# Patient Record
Sex: Male | Born: 1945 | Hispanic: No | Marital: Married | Smoking: Never smoker
Health system: Southern US, Community
[De-identification: ages and names within clinical notes are randomized; demographics above are authoritative.]

## PROBLEM LIST (undated history)

## (undated) DIAGNOSIS — Z95811 Presence of heart assist device: Secondary | ICD-10-CM

## (undated) DIAGNOSIS — Z87442 Personal history of urinary calculi: Secondary | ICD-10-CM

## (undated) DIAGNOSIS — I5042 Chronic combined systolic (congestive) and diastolic (congestive) heart failure: Secondary | ICD-10-CM

## (undated) DIAGNOSIS — G4730 Sleep apnea, unspecified: Secondary | ICD-10-CM

## (undated) DIAGNOSIS — Z95810 Presence of automatic (implantable) cardiac defibrillator: Secondary | ICD-10-CM

## (undated) DIAGNOSIS — Z8719 Personal history of other diseases of the digestive system: Secondary | ICD-10-CM

## (undated) DIAGNOSIS — E119 Type 2 diabetes mellitus without complications: Secondary | ICD-10-CM

## (undated) DIAGNOSIS — R570 Cardiogenic shock: Secondary | ICD-10-CM

## (undated) DIAGNOSIS — E785 Hyperlipidemia, unspecified: Secondary | ICD-10-CM

## (undated) DIAGNOSIS — Z4502 Encounter for adjustment and management of automatic implantable cardiac defibrillator: Secondary | ICD-10-CM

## (undated) DIAGNOSIS — N189 Chronic kidney disease, unspecified: Secondary | ICD-10-CM

## (undated) DIAGNOSIS — Z87898 Personal history of other specified conditions: Secondary | ICD-10-CM

## (undated) DIAGNOSIS — N135 Crossing vessel and stricture of ureter without hydronephrosis: Secondary | ICD-10-CM

## (undated) DIAGNOSIS — N179 Acute kidney failure, unspecified: Secondary | ICD-10-CM

## (undated) DIAGNOSIS — E559 Vitamin D deficiency, unspecified: Secondary | ICD-10-CM

## (undated) DIAGNOSIS — J939 Pneumothorax, unspecified: Secondary | ICD-10-CM

## (undated) HISTORY — DX: Pneumothorax, unspecified: J93.9

## (undated) HISTORY — DX: Presence of heart assist device: Z95.811

---
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
## 2002-04-05 ENCOUNTER — Encounter: Payer: Self-pay | Admitting: Emergency Medicine

## 2002-04-05 ENCOUNTER — Emergency Department (HOSPITAL_COMMUNITY): Admission: EM | Admit: 2002-04-05 | Discharge: 2002-04-05 | Payer: Self-pay | Admitting: Emergency Medicine

## 2002-04-12 ENCOUNTER — Emergency Department (HOSPITAL_COMMUNITY): Admission: EM | Admit: 2002-04-12 | Discharge: 2002-04-12 | Payer: Self-pay | Admitting: Emergency Medicine

## 2003-07-24 ENCOUNTER — Encounter
Admission: RE | Admit: 2003-07-24 | Discharge: 2003-10-22 | Payer: Self-pay | Admitting: Physical Medicine & Rehabilitation

## 2005-03-20 ENCOUNTER — Ambulatory Visit (HOSPITAL_COMMUNITY): Admission: RE | Admit: 2005-03-20 | Discharge: 2005-03-20 | Payer: Self-pay | Admitting: Cardiovascular Disease

## 2005-08-25 ENCOUNTER — Encounter: Admission: RE | Admit: 2005-08-25 | Discharge: 2005-08-25 | Payer: Self-pay | Admitting: Specialist

## 2011-01-31 ENCOUNTER — Other Ambulatory Visit (HOSPITAL_COMMUNITY): Payer: Self-pay | Admitting: Cardiovascular Disease

## 2011-02-06 ENCOUNTER — Inpatient Hospital Stay (HOSPITAL_COMMUNITY): Admission: RE | Admit: 2011-02-06 | Payer: Self-pay | Source: Ambulatory Visit

## 2011-02-06 ENCOUNTER — Other Ambulatory Visit (HOSPITAL_COMMUNITY): Payer: Self-pay

## 2014-08-08 ENCOUNTER — Other Ambulatory Visit: Payer: Self-pay | Admitting: Orthopaedic Surgery

## 2014-08-08 DIAGNOSIS — M47816 Spondylosis without myelopathy or radiculopathy, lumbar region: Secondary | ICD-10-CM

## 2014-08-13 ENCOUNTER — Ambulatory Visit
Admission: RE | Admit: 2014-08-13 | Discharge: 2014-08-13 | Disposition: A | Discharge status: Home / Self Care | Payer: BLUE CROSS/BLUE SHIELD | Source: Ambulatory Visit | Attending: Orthopaedic Surgery | Admitting: Orthopaedic Surgery

## 2014-08-13 DIAGNOSIS — M47816 Spondylosis without myelopathy or radiculopathy, lumbar region: Secondary | ICD-10-CM

## 2015-03-14 ENCOUNTER — Other Ambulatory Visit: Payer: Self-pay | Admitting: Urology

## 2015-03-23 ENCOUNTER — Encounter (HOSPITAL_COMMUNITY)
Admission: RE | Admit: 2015-03-23 | Discharge: 2015-03-23 | Disposition: A | Discharge status: Home / Self Care | Payer: Medicare Other | Source: Ambulatory Visit | Attending: Urology | Admitting: Urology

## 2015-03-23 ENCOUNTER — Encounter (HOSPITAL_COMMUNITY): Payer: Self-pay

## 2015-03-23 DIAGNOSIS — Z01818 Encounter for other preprocedural examination: Secondary | ICD-10-CM | POA: Insufficient documentation

## 2015-03-23 HISTORY — DX: Personal history of other specified conditions: Z87.898

## 2015-03-23 HISTORY — DX: Chronic kidney disease, unspecified: N18.9

## 2015-03-23 HISTORY — DX: Sleep apnea, unspecified: G47.30

## 2015-03-23 HISTORY — DX: Type 2 diabetes mellitus without complications: E11.9

## 2015-03-23 HISTORY — DX: Vitamin D deficiency, unspecified: E55.9

## 2015-03-23 HISTORY — DX: Personal history of other diseases of the digestive system: Z87.19

## 2015-03-23 HISTORY — DX: Hyperlipidemia, unspecified: E78.5

## 2015-03-23 LAB — COMPREHENSIVE METABOLIC PANEL
ALT: 27 U/L (ref 17–63)
AST: 28 U/L (ref 15–41)
Albumin: 4.1 g/dL (ref 3.5–5.0)
Alkaline Phosphatase: 52 U/L (ref 38–126)
Anion gap: 9 (ref 5–15)
BUN: 14 mg/dL (ref 6–20)
CO2: 24 mmol/L (ref 22–32)
Calcium: 9.1 mg/dL (ref 8.9–10.3)
Chloride: 106 mmol/L (ref 101–111)
Creatinine, Ser: 1.01 mg/dL (ref 0.61–1.24)
GFR calc Af Amer: >60 mL/min (ref >60)
GFR calc non Af Amer: >60 mL/min (ref >60)
Glucose, Bld: 114 mg/dL — ABNORMAL HIGH (ref 65–99)
Potassium: 4.4 mmol/L (ref 3.5–5.1)
Sodium: 139 mmol/L (ref 135–145)
Total Bilirubin: 0.3 mg/dL (ref 0.3–1.2)
Total Protein: 7.1 g/dL (ref 6.5–8.1)

## 2015-03-23 LAB — CBC
HCT: 42.4 % (ref 39.0–52.0)
Hemoglobin: 14.2 g/dL (ref 13.0–17.0)
MCH: 29.9 pg (ref 26.0–34.0)
MCHC: 33.5 g/dL (ref 30.0–36.0)
MCV: 89.3 fL (ref 78.0–100.0)
Platelets: 273 10*3/uL (ref 150–400)
RBC: 4.75 MIL/uL (ref 4.22–5.81)
RDW: 14 % (ref 11.5–15.5)
WBC: 5.7 10*3/uL (ref 4.0–10.5)

## 2015-03-23 NOTE — Patient Instructions (Signed)
Luis [name]  03/23/2015   Your procedure is scheduled on: Monday March 26, 2015   Report to [facility] Main  Entrance take [place]  elevators to 3rd floor to  [name] at 9:30 AM.  Call this number if you have problems the morning of surgery [phone]   Remember: ONLY 1 PERSON MAY GO WITH YOU TO SHORT STAY TO GET  READY MORNING OF [place].  Do not eat food or drink liquids :After Midnight.     Take these medicines the morning of surgery with A SIP OF WATER: NONE                               You may not have any metal on your body including hair pins and              piercings  Do not wear jewelry,  lotions, powders or colognes, deodorant             Men may shave face and neck.   Do not bring valuables to the hospital. [place].  Contacts, dentures or bridgework may not be worn into surgery.  Leave suitcase in the car. After surgery it may be brought to your room.             _____________________________________________________________________             [facility] - Preparing for Surgery Before surgery, you can play an important role.  Because skin is not sterile, your skin needs to be as free of germs as possible.  You can reduce the number of germs on your skin by washing with CHG (chlorahexidine gluconate) soap before surgery.  CHG is an antiseptic cleaner which kills germs and bonds with the skin to continue killing germs even after washing. Please DO NOT use if you have an allergy to CHG or antibacterial soaps.  If your skin becomes reddened/irritated stop using the CHG and inform your nurse when you arrive at Short Stay. Do not shave (including legs and underarms) for at least 48 hours prior to the first CHG shower.  You may shave your face/neck. Please follow these instructions carefully:  1.  Shower with CHG Soap the night before surgery and the  morning of Surgery.  2.  If you  choose to wash your hair, wash your hair first as usual with your  normal  shampoo.  3.  After you shampoo, rinse your hair and body thoroughly to remove the  shampoo.                           4.  Use CHG as you would any other liquid soap.  You can apply chg directly  to the skin and wash                       Gently with a scrungie or clean washcloth.  5.  Apply the CHG Soap to your body ONLY FROM THE NECK DOWN.   Do not use on face/ open                           Wound  or open sores. Avoid contact with eyes, ears mouth and genitals (private parts).                       Wash face,  Genitals (private parts) with your normal soap.             6.  Wash thoroughly, paying special attention to the area where your surgery  will be performed.  7.  Thoroughly rinse your body with warm water from the neck down.  8.  DO NOT shower/wash with your normal soap after using and rinsing off  the CHG Soap.                9.  Pat yourself dry with a clean towel.            10.  Wear clean pajamas.            11.  Place clean sheets on your bed the night of your first shower and do not  sleep with pets. Day of Surgery : Do not apply any lotions/deodorants the morning of surgery.  Please wear clean clothes to the hospital/surgery center.  FAILURE TO FOLLOW THESE INSTRUCTIONS MAY RESULT IN THE CANCELLATION OF YOUR SURGERY PATIENT SIGNATURE_________________________________  NURSE SIGNATURE__________________________________  ________________________________________________________________________

## 2015-03-23 NOTE — Progress Notes (Signed)
Reviewed H&P and EKG's per anesthesia / Dr Marcell Barlow. Anesthesia to see pt day of surgery.

## 2015-03-23 NOTE — Progress Notes (Addendum)
H&P per Dr [name] 02/06/2015 per chart discusses ECHO pt had while in [place] 11/03/2014

## 2015-03-25 MED ORDER — GENTAMICIN SULFATE 40 MG/ML IJ SOLN
420.0000 mg | INTRAVENOUS | Status: AC
  Administered 2015-03-26: 420 mg via INTRAVENOUS
  Filled 2015-03-25: qty 10.5

## 2015-03-26 ENCOUNTER — Inpatient Hospital Stay (HOSPITAL_COMMUNITY): Payer: Medicare Other

## 2015-03-26 ENCOUNTER — Inpatient Hospital Stay (HOSPITAL_COMMUNITY)
Admission: RE | Admit: 2015-03-26 | Discharge: 2015-03-28 | DRG: 661 | Disposition: A | Discharge status: Home / Self Care | Payer: Medicare Other | Source: Ambulatory Visit | Attending: Urology | Admitting: Urology

## 2015-03-26 ENCOUNTER — Inpatient Hospital Stay (HOSPITAL_COMMUNITY): Payer: Medicare Other | Admitting: Registered Nurse

## 2015-03-26 ENCOUNTER — Encounter (HOSPITAL_COMMUNITY): Payer: Self-pay | Admitting: *Deleted

## 2015-03-26 ENCOUNTER — Encounter (HOSPITAL_COMMUNITY)
Admission: RE | Disposition: A | Discharge status: Home / Self Care | Payer: Self-pay | Source: Ambulatory Visit | Attending: Urology

## 2015-03-26 DIAGNOSIS — N182 Chronic kidney disease, stage 2 (mild): Secondary | ICD-10-CM | POA: Diagnosis present

## 2015-03-26 DIAGNOSIS — Z87442 Personal history of urinary calculi: Secondary | ICD-10-CM

## 2015-03-26 DIAGNOSIS — Z01810 Encounter for preprocedural cardiovascular examination: Secondary | ICD-10-CM

## 2015-03-26 DIAGNOSIS — Z23 Encounter for immunization: Secondary | ICD-10-CM | POA: Diagnosis not present

## 2015-03-26 DIAGNOSIS — E119 Type 2 diabetes mellitus without complications: Secondary | ICD-10-CM | POA: Diagnosis present

## 2015-03-26 DIAGNOSIS — N200 Calculus of kidney: Principal | ICD-10-CM | POA: Diagnosis present

## 2015-03-26 DIAGNOSIS — K219 Gastro-esophageal reflux disease without esophagitis: Secondary | ICD-10-CM | POA: Diagnosis present

## 2015-03-26 DIAGNOSIS — K760 Fatty (change of) liver, not elsewhere classified: Secondary | ICD-10-CM | POA: Diagnosis present

## 2015-03-26 DIAGNOSIS — E785 Hyperlipidemia, unspecified: Secondary | ICD-10-CM | POA: Diagnosis present

## 2015-03-26 DIAGNOSIS — N281 Cyst of kidney, acquired: Secondary | ICD-10-CM | POA: Diagnosis present

## 2015-03-26 DIAGNOSIS — E559 Vitamin D deficiency, unspecified: Secondary | ICD-10-CM | POA: Diagnosis present

## 2015-03-26 DIAGNOSIS — N135 Crossing vessel and stricture of ureter without hydronephrosis: Secondary | ICD-10-CM

## 2015-03-26 DIAGNOSIS — G4730 Sleep apnea, unspecified: Secondary | ICD-10-CM | POA: Diagnosis present

## 2015-03-26 DIAGNOSIS — Z01812 Encounter for preprocedural laboratory examination: Secondary | ICD-10-CM

## 2015-03-26 DIAGNOSIS — Q638 Other specified congenital malformations of kidney: Secondary | ICD-10-CM | POA: Diagnosis not present

## 2015-03-26 DIAGNOSIS — Z419 Encounter for procedure for purposes other than remedying health state, unspecified: Secondary | ICD-10-CM

## 2015-03-26 HISTORY — PX: CYSTOSCOPY W/ RETROGRADES: SHX1426

## 2015-03-26 HISTORY — PX: ROBOT ASSISTED PYELOPLASTY: SHX5143

## 2015-03-26 HISTORY — DX: Crossing vessel and stricture of ureter without hydronephrosis: N13.5

## 2015-03-26 LAB — BASIC METABOLIC PANEL
Anion gap: 10 (ref 5–15)
BUN: 19 mg/dL (ref 6–20)
CO2: 22 mmol/L (ref 22–32)
Calcium: 8.1 mg/dL — ABNORMAL LOW (ref 8.9–10.3)
Chloride: 104 mmol/L (ref 101–111)
Creatinine, Ser: 1.18 mg/dL (ref 0.61–1.24)
GFR calc Af Amer: >60 mL/min (ref >60)
GFR calc non Af Amer: >60 mL/min (ref >60)
Glucose, Bld: 123 mg/dL — ABNORMAL HIGH (ref 65–99)
Potassium: 4.3 mmol/L (ref 3.5–5.1)
Sodium: 136 mmol/L (ref 135–145)

## 2015-03-26 LAB — GLUCOSE, CAPILLARY
Glucose-Capillary: 92 mg/dL (ref 65–99)
Glucose-Capillary: 120 mg/dL — ABNORMAL HIGH (ref 65–99)

## 2015-03-26 LAB — HEMOGLOBIN AND HEMATOCRIT, BLOOD
HCT: 42.2 % (ref 39.0–52.0)
Hemoglobin: 14 g/dL (ref 13.0–17.0)

## 2015-03-26 LAB — TYPE AND SCREEN
ABO/RH(D): A POS
Antibody Screen: NEGATIVE

## 2015-03-26 LAB — ABO/RH: ABO/RH(D): A POS

## 2015-03-26 SURGERY — ROBOTIC ASSISTED PYELOPLASTY WITH STENT PLACEMENT
Anesthesia: General | Laterality: Left

## 2015-03-26 MED ORDER — ROSUVASTATIN CALCIUM 20 MG PO TABS
20.0000 mg | ORAL_TABLET | Freq: Every morning | ORAL | Status: DC
  Administered 2015-03-26 – 2015-03-27 (×2): 20 mg via ORAL
  Filled 2015-03-26 (×2): qty 1

## 2015-03-26 MED ORDER — PNEUMOCOCCAL VAC POLYVALENT 25 MCG/0.5ML IJ INJ
0.5000 mL | INJECTION | INTRAMUSCULAR | Status: AC
  Administered 2015-03-27: 0.5 mL via INTRAMUSCULAR
  Filled 2015-03-26 (×2): qty 0.5

## 2015-03-26 MED ORDER — SODIUM CHLORIDE 0.9 % IR SOLN
Status: DC | PRN
  Administered 2015-03-26: 3000 mL

## 2015-03-26 MED ORDER — CEFAZOLIN SODIUM-DEXTROSE 2-3 GM-% IV SOLR
2.0000 g | INTRAVENOUS | Status: AC
  Administered 2015-03-26: 2 g via INTRAVENOUS

## 2015-03-26 MED ORDER — BUPIVACAINE LIPOSOME 1.3 % IJ SUSP
20.0000 mL | Freq: Once | INTRAMUSCULAR | Status: AC
  Administered 2015-03-26: 20 mL
  Filled 2015-03-26: qty 20

## 2015-03-26 MED ORDER — SODIUM CHLORIDE 0.9 % IV SOLN
1.5000 g | Freq: Four times a day (QID) | INTRAVENOUS | Status: AC
  Administered 2015-03-26 – 2015-03-27 (×3): 1.5 g via INTRAVENOUS
  Filled 2015-03-26 (×3): qty 1.5

## 2015-03-26 MED ORDER — LIDOCAINE HCL (CARDIAC) 20 MG/ML IV SOLN
INTRAVENOUS | Status: AC
  Filled 2015-03-26: qty 5

## 2015-03-26 MED ORDER — PROPOFOL 10 MG/ML IV BOLUS
INTRAVENOUS | Status: AC
  Filled 2015-03-26: qty 20

## 2015-03-26 MED ORDER — ACETAMINOPHEN 500 MG PO TABS
1000.0000 mg | ORAL_TABLET | Freq: Three times a day (TID) | ORAL | Status: AC
  Administered 2015-03-26 – 2015-03-27 (×3): 1000 mg via ORAL
  Filled 2015-03-26 (×3): qty 2

## 2015-03-26 MED ORDER — SENNOSIDES-DOCUSATE SODIUM 8.6-50 MG PO TABS
2.0000 | ORAL_TABLET | Freq: Every day | ORAL | Status: DC
  Administered 2015-03-26 – 2015-03-27 (×2): 2 via ORAL
  Filled 2015-03-26 (×2): qty 2

## 2015-03-26 MED ORDER — LACTATED RINGERS IR SOLN
Status: DC | PRN
  Administered 2015-03-26: 1000 mL

## 2015-03-26 MED ORDER — OXYCODONE HCL 5 MG PO TABS
5.0000 mg | ORAL_TABLET | ORAL | Status: DC | PRN
  Administered 2015-03-26 – 2015-03-27 (×3): 5 mg via ORAL
  Filled 2015-03-26 (×3): qty 1

## 2015-03-26 MED ORDER — ONDANSETRON HCL 4 MG/2ML IJ SOLN
INTRAMUSCULAR | Status: AC
  Filled 2015-03-26: qty 2

## 2015-03-26 MED ORDER — LACTATED RINGERS IV SOLN
INTRAVENOUS | Status: DC | PRN
  Administered 2015-03-26: 11:00:00 via INTRAVENOUS

## 2015-03-26 MED ORDER — HYDROMORPHONE HCL 1 MG/ML IJ SOLN
INTRAMUSCULAR | Status: DC | PRN
  Administered 2015-03-26 (×3): .2 mg via INTRAVENOUS
  Administered 2015-03-26: .4 mg via INTRAVENOUS

## 2015-03-26 MED ORDER — SODIUM CHLORIDE 0.9 % IV SOLN
INTRAVENOUS | Status: DC
  Administered 2015-03-26 – 2015-03-27 (×3): via INTRAVENOUS

## 2015-03-26 MED ORDER — WATER FOR IRRIGATION, STERILE IR SOLN
Status: DC | PRN
  Administered 2015-03-26: 3000 mL

## 2015-03-26 MED ORDER — ROCURONIUM BROMIDE 100 MG/10ML IV SOLN
INTRAVENOUS | Status: DC | PRN
  Administered 2015-03-26 (×2): 5 mg via INTRAVENOUS
  Administered 2015-03-26: 40 mg via INTRAVENOUS
  Administered 2015-03-26: 10 mg via INTRAVENOUS

## 2015-03-26 MED ORDER — SODIUM CHLORIDE 0.9 % IJ SOLN
INTRAMUSCULAR | Status: AC
  Filled 2015-03-26: qty 20

## 2015-03-26 MED ORDER — GLYCOPYRROLATE 0.2 MG/ML IJ SOLN
INTRAMUSCULAR | Status: DC | PRN
  Administered 2015-03-26: .6 mg via INTRAVENOUS

## 2015-03-26 MED ORDER — MIDAZOLAM HCL 2 MG/2ML IJ SOLN
INTRAMUSCULAR | Status: AC
Start: 2015-03-26 — End: 2015-03-26
  Filled 2015-03-26: qty 2

## 2015-03-26 MED ORDER — EPHEDRINE SULFATE 50 MG/ML IJ SOLN
INTRAMUSCULAR | Status: AC
  Filled 2015-03-26: qty 1

## 2015-03-26 MED ORDER — SUCCINYLCHOLINE CHLORIDE 20 MG/ML IJ SOLN
INTRAMUSCULAR | Status: DC | PRN
  Administered 2015-03-26: 100 mg via INTRAVENOUS

## 2015-03-26 MED ORDER — LIDOCAINE HCL (CARDIAC) 20 MG/ML IV SOLN
INTRAVENOUS | Status: DC | PRN
  Administered 2015-03-26: 100 mg via INTRAVENOUS

## 2015-03-26 MED ORDER — MIDAZOLAM HCL 5 MG/5ML IJ SOLN
INTRAMUSCULAR | Status: DC | PRN
  Administered 2015-03-26 (×2): 1 mg via INTRAVENOUS

## 2015-03-26 MED ORDER — HYDROMORPHONE HCL 1 MG/ML IJ SOLN
0.5000 mg | INTRAMUSCULAR | Status: DC | PRN
  Administered 2015-03-26 – 2015-03-28 (×2): 1 mg via INTRAVENOUS
  Filled 2015-03-26 (×4): qty 1

## 2015-03-26 MED ORDER — HYDROMORPHONE HCL 1 MG/ML IJ SOLN
INTRAMUSCULAR | Status: AC
  Filled 2015-03-26: qty 1

## 2015-03-26 MED ORDER — NEOSTIGMINE METHYLSULFATE 10 MG/10ML IV SOLN
INTRAVENOUS | Status: DC | PRN
  Administered 2015-03-26: 4 mg via INTRAVENOUS

## 2015-03-26 MED ORDER — ONDANSETRON HCL 4 MG/2ML IJ SOLN: 4.0000 mg | INTRAMUSCULAR | Status: DC | PRN

## 2015-03-26 MED ORDER — IOHEXOL 300 MG/ML  SOLN
INTRAMUSCULAR | Status: DC | PRN
  Administered 2015-03-26: 9 mL

## 2015-03-26 MED ORDER — CEFAZOLIN SODIUM-DEXTROSE 2-3 GM-% IV SOLR
INTRAVENOUS | Status: AC
  Filled 2015-03-26: qty 50

## 2015-03-26 MED ORDER — HYDROMORPHONE HCL 2 MG/ML IJ SOLN
INTRAMUSCULAR | Status: AC
  Filled 2015-03-26: qty 1

## 2015-03-26 MED ORDER — PHENYLEPHRINE HCL 10 MG/ML IJ SOLN
INTRAMUSCULAR | Status: DC | PRN
  Administered 2015-03-26 (×2): 80 ug via INTRAVENOUS

## 2015-03-26 MED ORDER — SODIUM CHLORIDE 0.9 % IJ SOLN
INTRAMUSCULAR | Status: AC
  Filled 2015-03-26: qty 10

## 2015-03-26 MED ORDER — ONDANSETRON HCL 4 MG/2ML IJ SOLN
INTRAMUSCULAR | Status: DC | PRN
  Administered 2015-03-26: 4 mg via INTRAVENOUS

## 2015-03-26 MED ORDER — FENTANYL CITRATE (PF) 100 MCG/2ML IJ SOLN
INTRAMUSCULAR | Status: DC | PRN
  Administered 2015-03-26 (×5): 50 ug via INTRAVENOUS

## 2015-03-26 MED ORDER — ROCURONIUM BROMIDE 100 MG/10ML IV SOLN
INTRAVENOUS | Status: AC
  Filled 2015-03-26: qty 1

## 2015-03-26 MED ORDER — GLYCOPYRROLATE 0.2 MG/ML IJ SOLN
INTRAMUSCULAR | Status: AC
  Filled 2015-03-26: qty 3

## 2015-03-26 MED ORDER — PROPOFOL 10 MG/ML IV BOLUS
INTRAVENOUS | Status: DC | PRN
  Administered 2015-03-26: 180 mg via INTRAVENOUS

## 2015-03-26 MED ORDER — FENTANYL CITRATE (PF) 250 MCG/5ML IJ SOLN
INTRAMUSCULAR | Status: AC
  Filled 2015-03-26: qty 5

## 2015-03-26 MED ORDER — HYDROMORPHONE HCL 1 MG/ML IJ SOLN
0.2500 mg | INTRAMUSCULAR | Status: DC | PRN
  Administered 2015-03-26: 0.25 mg via INTRAVENOUS
  Administered 2015-03-26: 0.5 mg via INTRAVENOUS

## 2015-03-26 SURGICAL SUPPLY — 68 items
BAG SPEC RTRVL LRG 6X4 10 (ENDOMECHANICALS) ×1
BAG URO CATCHER STRL LF (DRAPE) ×3 IMPLANT
BASKET STONE NITINOL 3FRX115MB (UROLOGICAL SUPPLIES) ×2 IMPLANT
CANISTER OMNI JUG 16 LITER (MISCELLANEOUS) ×1 IMPLANT
CATH INTERMIT  6FR 70CM (CATHETERS) ×3 IMPLANT
CHLORAPREP W/TINT 26ML (MISCELLANEOUS) ×3 IMPLANT
CLIP LIGATING HEM O LOK PURPLE (MISCELLANEOUS) ×2 IMPLANT
CLIP LIGATING HEMO LOK XL GOLD (MISCELLANEOUS) ×2 IMPLANT
CLIP LIGATING HEMO O LOK GREEN (MISCELLANEOUS) ×2 IMPLANT
CORD HIGH FREQUENCY UNIPOLAR (ELECTROSURGICAL) ×3 IMPLANT
CORDS BIPOLAR (ELECTRODE) ×3 IMPLANT
COVER TIP SHEARS 8 DVNC (MISCELLANEOUS) ×1 IMPLANT
COVER TIP SHEARS 8MM DA VINCI (MISCELLANEOUS) ×2
DECANTER SPIKE VIAL GLASS SM (MISCELLANEOUS) ×1 IMPLANT
DRAIN CHANNEL 15F RND FF 3/16 (WOUND CARE) ×3 IMPLANT
DRAPE INCISE IOBAN 66X45 STRL (DRAPES) ×3 IMPLANT
DRAPE LAPAROSCOPIC ABDOMINAL (DRAPES) ×3 IMPLANT
DRAPE SHEET LG 3/4 BI-LAMINATE (DRAPES) ×3 IMPLANT
DRAPE TABLE BACK 44X90 PK DISP (DRAPES) ×3 IMPLANT
DRAPE UTILITY XL STRL (DRAPES) ×3 IMPLANT
DRAPE WARM FLUID 44X44 (DRAPE) ×3 IMPLANT
ELECT REM PT RETURN 9FT ADLT (ELECTROSURGICAL) ×3
ELECTRODE REM PT RTRN 9FT ADLT (ELECTROSURGICAL) ×1 IMPLANT
EVACUATOR SILICONE 100CC (DRAIN) ×3 IMPLANT
GLOVE BIOGEL M STRL SZ7.5 (GLOVE) ×7 IMPLANT
GOWN STRL REUS W/TWL LRG LVL3 (GOWN DISPOSABLE) ×6 IMPLANT
GUIDEWIRE ANG ZIPWIRE 038X150 (WIRE) ×3 IMPLANT
KIT ACCESSORY DA VINCI DISP (KITS)
KIT ACCESSORY DVNC DISP (KITS) ×1 IMPLANT
KIT BASIN OR (CUSTOM PROCEDURE TRAY) ×3 IMPLANT
LOOP VESSEL MAXI BLUE (MISCELLANEOUS) ×2 IMPLANT
NDL INSUFFLATION 14GA 120MM (NEEDLE) ×1 IMPLANT
NEEDLE INSUFFLATION 14GA 120MM (NEEDLE) ×3 IMPLANT
NS IRRIG 1000ML POUR BTL (IV SOLUTION) ×1 IMPLANT
PACK CYSTO (CUSTOM PROCEDURE TRAY) ×3 IMPLANT
PAD POSITIONING PINK XL (MISCELLANEOUS) IMPLANT
PENCIL BUTTON HOLSTER BLD 10FT (ELECTRODE) ×3 IMPLANT
POSITIONER SURGICAL ARM (MISCELLANEOUS) ×6 IMPLANT
POUCH SPECIMEN RETRIEVAL 10MM (ENDOMECHANICALS) ×2 IMPLANT
SET IRRIG Y TYPE TUR BLADDER L (SET/KITS/TRAYS/PACK) ×2 IMPLANT
SET TUBE IRRIG SUCTION NO TIP (IRRIGATION / IRRIGATOR) ×2 IMPLANT
SHEET LAVH (DRAPES) IMPLANT
SOLUTION ANTI FOG 6CC (MISCELLANEOUS) ×3 IMPLANT
SOLUTION ELECTROLUBE (MISCELLANEOUS) ×3 IMPLANT
SPONGE LAP 18X18 X RAY DECT (DISPOSABLE) ×3 IMPLANT
SPONGE LAP 4X18 X RAY DECT (DISPOSABLE) ×3 IMPLANT
STAPLER VISISTAT 35W (STAPLE) ×1 IMPLANT
STENT CONTOUR 6FRX26X.038 (STENTS) ×2 IMPLANT
SURGILUBE 3G PEEL PACK STRL (MISCELLANEOUS) ×4 IMPLANT
SUT ETHILON 3 0 PS 1 (SUTURE) ×3 IMPLANT
SUT MNCRL 3 0 VIOLET RB1 (SUTURE) ×1 IMPLANT
SUT MONOCRYL 3 0 RB1 (SUTURE) ×6
SUT VIC AB 0 CT1 27 (SUTURE)
SUT VIC AB 0 CT1 27XBRD ANTBC (SUTURE) ×3 IMPLANT
SUT VIC AB 0 UR5 27 (SUTURE) ×2 IMPLANT
SUT VICRYL 0 UR6 27IN ABS (SUTURE) ×2 IMPLANT
SUT VLOC BARB 180 ABS3/0GR12 (SUTURE) ×3
SUTURE VLOC BRB 180 ABS3/0GR12 (SUTURE) IMPLANT
TOWEL OR NON WOVEN STRL DISP B (DISPOSABLE) ×3 IMPLANT
TRAY FOLEY BAG SILVER LF 16FR (CATHETERS) ×2 IMPLANT
TRAY LAPAROSCOPIC (CUSTOM PROCEDURE TRAY) ×3 IMPLANT
TROCAR BLADELESS OPT 12M 150M (ENDOMECHANICALS) ×1 IMPLANT
TROCAR BLADELESS OPT 5 100 (ENDOMECHANICALS) ×3 IMPLANT
TROCAR XCEL 12X100 BLDLESS (ENDOMECHANICALS) ×2 IMPLANT
TUBING CONNECTING 10 (TUBING) ×2 IMPLANT
TUBING CONNECTING 10' (TUBING) ×1
TUBING INSUFFLATION 10FT LAP (TUBING) ×3 IMPLANT
WATER STERILE IRR 1500ML POUR (IV SOLUTION) ×2 IMPLANT

## 2015-03-26 NOTE — Anesthesia Postprocedure Evaluation (Signed)
  Anesthesia Post-op Note  Patient: Luis [name]  Procedure(s) Performed: Procedure(s) (LRB): ROBOTIC ASSISTED PYELOPLASTY WITH STENT PLACEMENT, REMOVAL OF STONE AND CYST DECORTICATION (Left) CYSTOSCOPY WITH RETROGRADE PYELOGRAM (Left)  Patient Location: PACU  Anesthesia Type: General  Level of Consciousness: awake and alert   Airway and Oxygen Therapy: Patient Spontanous Breathing  Post-op Pain: mild  Post-op Assessment: Post-op Vital signs reviewed, Patient's Cardiovascular Status Stable, Respiratory Function Stable, Patent Airway and No signs of Nausea or vomiting  Last Vitals:  Filed Vitals:   03/26/15 1635  BP: 116/77  Pulse: 85  Temp: 36.6 C  Resp: 16    Post-op Vital Signs: stable   Complications: No apparent anesthesia complications

## 2015-03-26 NOTE — Transfer of Care (Signed)
Immediate Anesthesia Transfer of Care Note  Patient: Luis [name]  Procedure(s) Performed: Procedure(s): ROBOTIC ASSISTED PYELOPLASTY WITH STENT PLACEMENT, REMOVAL OF STONE AND CYST DECORTICATION (Left) CYSTOSCOPY WITH RETROGRADE PYELOGRAM (Left)  Patient Location: PACU  Anesthesia Type:General  Level of Consciousness: awake, alert , oriented and patient cooperative  Airway & Oxygen Therapy: Patient Spontanous Breathing  Post-op Assessment: Report given to RN, Post -op Vital signs reviewed and stable and Patient moving all extremities  Post vital signs: Reviewed and stable  Last Vitals:  Filed Vitals:   03/26/15 0934  BP: 135/88  Pulse: 96  Temp: 37 C  Resp: 16    Complications: No apparent anesthesia complications

## 2015-03-26 NOTE — Anesthesia Preprocedure Evaluation (Addendum)
Anesthesia Evaluation  Patient identified by MRN, date of birth, ID band Patient awake    Reviewed: Allergy & Precautions, NPO status , Patient's Chart, lab work & pertinent test results  Airway Mallampati: II  TM Distance: >3 FB Neck ROM: Full    Dental no notable dental hx.    Pulmonary sleep apnea ,  breath sounds clear to auscultation  Pulmonary exam normal       Cardiovascular Exercise Tolerance: Good Normal cardiovascular examRhythm:Regular Rate:Normal     Neuro/Psych negative neurological ROS  negative psych ROS   GI/Hepatic negative GI ROS, Neg liver ROS,   Endo/Other  diabetes, Type 2  Renal/GU Renal disease  negative genitourinary   Musculoskeletal negative musculoskeletal ROS (+)   Abdominal   Peds negative pediatric ROS (+)  Hematology negative hematology ROS (+)   Anesthesia Other Findings   Reproductive/Obstetrics negative OB ROS                          Anesthesia Physical Anesthesia Plan  ASA: III  Anesthesia Plan: General   Post-op Pain Management:    Induction: Intravenous  Airway Management Planned: Oral ETT  Additional Equipment:   Intra-op Plan:   Post-operative Plan: Extubation in OR  Informed Consent: I have reviewed the patients History and Physical, chart, labs and discussed the procedure including the risks, benefits and alternatives for the proposed anesthesia with the patient or authorized representative who has indicated his/her understanding and acceptance.   Dental advisory given  Plan Discussed with: CRNA  Anesthesia Plan Comments:         Anesthesia Quick Evaluation

## 2015-03-26 NOTE — Progress Notes (Signed)
Dr. Tresa Moore made aware of patient temp and cough. Note to Anest re: call Dr. Tresa Moore if any doubt proceeding with surgery

## 2015-03-26 NOTE — Brief Op Note (Signed)
03/26/2015  2:50 PM  PATIENT:  Luis [name]  69 y.o. male  PRE-OPERATIVE DIAGNOSIS:  LARGE LEFT RENAL STONE, LEFT URETEROPELVIC JUNCTION OBSTRUCTION, LEFT RENAL CYST  POST-OPERATIVE DIAGNOSIS:  LARGE LEFT RENAL STONE, LEFT URETEROPELVIC JUNCTION OBSTRUCTION, LEFT RENAL CYST  PROCEDURE:  Procedure(s): ROBOTIC ASSISTED PYELOPLASTY WITH STENT PLACEMENT, REMOVAL OF STONE AND CYST DECORTICATION (Left) CYSTOSCOPY WITH RETROGRADE PYELOGRAM (Left)  SURGEON:  Surgeon(s) and Role:    * [name], MD - Primary    * Cleon Gustin, MD - Assisting  PHYSICIAN ASSISTANT:   ASSISTANTS: 1 - Nicolette Bang MD   ANESTHESIA:   local and general  EBL:  Total I/O In: 1000 [I.V.:1000] Out: 150 [Urine:50; Blood:100]  BLOOD ADMINISTERED:none  DRAINS: 1 - foley to gravity drain, 2 - JP to bulb   LOCAL MEDICATIONS USED:  MARCAINE     SPECIMEN:  Source of Specimen:   1- large left renal stones, 2 - left renal cyst wall  DISPOSITION OF SPECIMEN:  ( cyst wall ); [facility] for stones  COUNTS:  YES  TOURNIQUET:  * No tourniquets in log *  DICTATION: .Other Dictation: Dictation Number [id number]  PLAN OF CARE: Admit to inpatient   PATIENT DISPOSITION:  PACU - hemodynamically stable.   Delay start of Pharmacological VTE agent (>24hrs) due to surgical blood loss or risk of bleeding: yes

## 2015-03-26 NOTE — Anesthesia Procedure Notes (Signed)
Procedure Name: Intubation Date/Time: 03/26/2015 11:43 AM Performed by: Carleene Cooper A Pre-anesthesia Checklist: Patient identified, Emergency Drugs available, Suction available, Patient being monitored and Timeout performed Patient Re-evaluated:Patient Re-evaluated prior to inductionOxygen Delivery Method: Circle system utilized Preoxygenation: Pre-oxygenation with 100% oxygen Intubation Type: IV induction Ventilation: Mask ventilation without difficulty Laryngoscope Size: Mac and 4 Grade View: Grade III Tube type: Oral Tube size: 7.5 mm Number of attempts: 1 Airway Equipment and Method: Stylet Placement Confirmation: ETT inserted through vocal cords under direct vision,  positive ETCO2 and breath sounds checked- equal and bilateral Secured at: 23 cm Tube secured with: Tape Dental Injury: Teeth and Oropharynx as per pre-operative assessment  Comments: Intubation by Dr. Delma Post.

## 2015-03-26 NOTE — H&P (Signed)
Luis [name] is an 69 y.o. male.    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 Stone predominantly in lower pole poriton of likely bifid kidny and poosibly associated with UPJ Obstruiton.  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 Accessort lower pole vein (behing ureter) off iliac vessels, small accessory lower pole artery appears anterior / obstructing, and dominant artery / vein suprior to this. Cr 01/2015 1.01.   3 - Left Renal Cyst - 3cm non-complex left renal cyst incidetnal on hematuria CT 2016. Minimal mass effect or nodularity.  PMH sig for GERD, Impaired fasting glucose (no meds). Denies CV disease. No strong blood thinners. His PCP is Luis Donning MD.  Today "Luis [name]" is seen to proceed with left robotic pyeloplaty / pyelolithotomy / cyst decortication. He has one isolated fever over weekend w/o localizing symptoms   Past Medical History  Diagnosis Date  . Dyslipidemia   . Sleep apnea     AHI 17 on sleep study 08/23/2013 pt states was given CPAP machine but sent it back - does not use   . Diabetes mellitus without complication     per H&P Dr [name] noted to be prediabetic 02/06/2015  . Chronic kidney disease     kidney stones; noted per H&P per Dr [name] 02/06/2015 chronic kidney disease stage II  . Vitamin D deficiency     as noted per H&P per Dr [name] 02/06/2015  . History of hepatomegaly     with fatty liver discussed per H&P per Dr [name] 02/06/2015 secondary to abd ultrasound     Past Surgical History  Procedure Laterality Date  . No past surgeries      No family history on file. Social History:  reports that he has never smoked. He has never used smokeless tobacco. He reports that he  does not drink alcohol or use illicit drugs.  Allergies: No Known Allergies  No prescriptions prior to admission    No results found for this or any previous visit (from the past 48 hour(s)). No results found.  Review of Systems  Constitutional:       Fever x 1 over weekend. No dysuria, productive cough, abd pain.   HENT: Negative.   Eyes: Negative.   Respiratory: Negative.   Cardiovascular: Negative.   Gastrointestinal: Negative.   Genitourinary: Negative.   Musculoskeletal: Negative.   Skin: Negative.   Neurological: Negative.   Endo/Heme/Allergies: Negative.   Psychiatric/Behavioral: Negative.     There were no vitals taken for this visit. Physical Exam  Constitutional: He appears well-developed.  HENT:  Head: Normocephalic.  Eyes: Pupils are equal, round, and reactive to light.  Neck: Normal range of motion.  Cardiovascular: Normal rate.   Respiratory: Effort normal.  GI: Soft.  Genitourinary: Penis normal.  Musculoskeletal: Normal range of motion.  Neurological: He is alert.  Skin: Skin is warm.  Psychiatric: He has a normal mood and affect. His behavior is normal. Judgment and thought content normal.     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 Most definitive means of management would be left pyelolithotomy / pyeloplasty. Smaller left upper pole stone also likely amenable to same approach. Alternatively staged PCNL possible,  but would not adress crossing-vessel UPJO, observaiton also reasonable as GFR normal, but kidney ceratinly at risk of infection and progressive functional decline with this large stone.   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   We rediscussed pyelolithotomy + pyeloplasty in detail including indication in cases of unusual anatomy / stone configuration concomitant UPJ abnormalities. We then  rediscussed surgical approaches including robotic and open techniques with robotic associated with a shorter convalescence. I showed the patient on their abdomen the approximately 4-6 incision (trocar) sites as well as presumed extraction sites with robotic approach as well as possible open incision sites. We specifically readdressed that there may be need to alter operative plans according to intraopertive findings including conversion to open procedure as well as need for adjunctive procedures such as ureteral stenting to promote correct renal healing. We rediscussed specific peri-operative risks including bleeding, infection, deep vein thrombosis, pulmonary embolism, compartment syndrome, neuropathy / neuropraxia, heart attack, stroke, death, as well as long-term risks such as non-cure / need for additional therapy and need for imaging and lab based post-op surveillance protocols. We discussed typical hospital course of approximately 2 day hospitalization, need for peri-operative drains / catheters, and typical post-hospital course with return to most non-strenuous activities by 2 weeks and ability to return to most jobs and more strenuous activity such as exercise by 6 weeks.   After this lengthy and detail discussion, including answering all of the patient's questions to their satisfaction, they have chosen to proceed with cysto, left retrograde / stent, left robotic pyeloplasty / pyelolithotomy in elective setting.   3 - Left Renal Cyst - likely benign cyst. Would decorticate for tissue sampling and to prevent progression / future mass effect at time of pyleoplasty / pyelolithotomy.  Luis [name] 03/26/2015, 7:24 AM

## 2015-03-27 ENCOUNTER — Encounter (HOSPITAL_COMMUNITY): Payer: Self-pay | Admitting: Urology

## 2015-03-27 DIAGNOSIS — N200 Calculus of kidney: Secondary | ICD-10-CM | POA: Diagnosis not present

## 2015-03-27 LAB — BASIC METABOLIC PANEL
Anion gap: 8 (ref 5–15)
BUN: 19 mg/dL (ref 6–20)
CO2: 26 mmol/L (ref 22–32)
Calcium: 8.3 mg/dL — ABNORMAL LOW (ref 8.9–10.3)
Chloride: 104 mmol/L (ref 101–111)
Creatinine, Ser: 1.19 mg/dL (ref 0.61–1.24)
GFR calc Af Amer: >60 mL/min (ref >60)
GFR calc non Af Amer: >60 mL/min (ref >60)
Glucose, Bld: 105 mg/dL — ABNORMAL HIGH (ref 65–99)
Potassium: 4.3 mmol/L (ref 3.5–5.1)
Sodium: 138 mmol/L (ref 135–145)

## 2015-03-27 LAB — CREATININE, FLUID (PLEURAL, PERITONEAL, JP DRAINAGE): Creat, Fluid: 3.8 mg/dL

## 2015-03-27 LAB — HEMOGLOBIN AND HEMATOCRIT, BLOOD
HCT: 38.2 % — ABNORMAL LOW (ref 39.0–52.0)
Hemoglobin: 12.8 g/dL — ABNORMAL LOW (ref 13.0–17.0)

## 2015-03-27 MED ORDER — OXYCODONE HCL 5 MG PO TABS
10.0000 mg | ORAL_TABLET | ORAL | Status: DC | PRN
  Administered 2015-03-27 – 2015-03-28 (×4): 10 mg via ORAL
  Filled 2015-03-27 (×5): qty 2

## 2015-03-27 NOTE — Progress Notes (Signed)
1 Day Post-Op   Subjective:  1 - Left UPJ Obstruction with Large Left Renal Stone and Left Renal Cyst - s/p cysto left ureteral stent placemtn and robotic left pyeloplasty + pyelolithotomy + cyst decortication on 03/26/15 the day of admission.   Today "Luis [name]" is w/o complaits. Passing flatus, ambulated in hall last PM. Pain controlled. Hgb >12 and Cr stable this AM. Minimal JP output.   Objective: Vital signs in last 24 hours: Temp:  [97.8 F (36.6 C)-98.9 F (37.2 C)] 98.8 F (37.1 C) (06/28 0529) Pulse Rate:  [77-116] 77 (06/28 0529) Resp:  [12-23] 16 (06/28 0529) BP: (109-159)/(64-114) 109/64 mmHg (06/28 0529) SpO2:  [94 %-100 %] 97 % (06/28 0529) Weight:  [83.462 kg (184 lb)] 83.462 kg (184 lb) (06/27 0943) Last BM Date: 03/26/15  Intake/Output from previous day: 06/27 0701 - 06/28 0700 In: 2623.8 [I.V.:2463.3; IV Piggyback:160.5] Out: 1160 [Urine:945; Drains:65; Blood:150] Intake/Output this shift: Total I/O In: -  Out: 267 [Urine:850; Drains:20]  General appearance: alert, cooperative, appears stated age and wife at bedside Eyes: negative Nose: Nares normal. Septum midline. Mucosa normal. No drainage or sinus tenderness. Throat: lips, mucosa, and tongue normal; teeth and gums normal Neck: supple, symmetrical, trachea midline Back: symmetric, no curvature. ROM normal. No CVA tenderness. Resp: non-labored on [place] O2 Cardio: Nl rate GI: soft, non-tender; bowel sounds normal; no masses,  no organomegaly Male genitalia: normal, foley c/d/i with light pink urine Extremities: extremities normal, atraumatic, no cyanosis or edema Pulses: 2+ and symmetric Skin: Skin color, texture, turgor normal. No rashes or lesions Lymph nodes: Cervical, supraclavicular, and axillary nodes normal. Neurologic: Grossly normal Incision/Wound: recent port sites c/d/i. JP with scant serosanguinous ourput.   Lab Results:   Recent Labs  03/26/15 1523 03/27/15 0450  HGB 14.0 12.8*  HCT  42.2 38.2*   BMET  Recent Labs  03/26/15 1523 03/27/15 0450  NA 136 138  K 4.3 4.3  CL 104 104  CO2 22 26  GLUCOSE 123* 105*  BUN 19 19  CREATININE 1.18 1.19  CALCIUM 8.1* 8.3*   PT/INR No results for input(s): LABPROT, INR in the last 72 hours. ABG No results for input(s): PHART, HCO3 in the last 72 hours.  Invalid input(s): PCO2, PO2  Studies/Results: Dg Retrograde Pyelogram  03/26/2015   CLINICAL DATA:  Left nephrolithiasis  EXAM: RETROGRADE PYELOGRAM  COMPARISON:  02/23/2015  FINDINGS: Left retrograde pyelogram demonstrates a normal-appearing ureter. Narrowing at the left UPJ noted with mild hydro compatible with a UPJ obstruction/ configuration. Filling defect in the collecting system correlates with the UPJ calculus.  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   Electronically Signed   By: Jerilynn Mages.  Shick M.D.   On: 03/26/2015 12:44    Anti-infectives: Anti-infectives    Start     Dose/Rate Route Frequency Ordered Stop   03/26/15 1800  ampicillin-sulbactam (UNASYN) 1.5 g in sodium chloride 0.9 % 50 mL IVPB     1.5 g 100 mL/hr over 30 Minutes Intravenous Every 6 hours 03/26/15 1639 03/27/15 0539   03/26/15 0928  ceFAZolin (ANCEF) IVPB 2 g/50 mL premix     2 g 100 mL/hr over 30 Minutes Intravenous 30 min pre-op 03/26/15 0928 03/26/15 1144   03/26/15 0000  gentamicin (GARAMYCIN) 420 mg in dextrose 5 % 100 mL IVPB     420 mg 110.5 mL/hr over 60 Minutes Intravenous 30 min pre-op 03/25/15 1105 03/26/15 1152  Assessment/Plan:  1 - Left UPJ Obstruction with Large Left Renal Stone and Left Renal Cyst - doing well POD 1. DC foley, check JP Cr few hours after foley out. Ambulate. Reg Diet. Remain in house.    [facility], Vetta Couzens 03/27/2015

## 2015-03-27 NOTE — Op Note (Signed)
Luis [name], Luis [name]               ACCOUNT NO.:  [id number]  MEDICAL RECORD NO.:  [id number]  LOCATION:  1440                         FACILITY:  [facility]  PHYSICIAN:  [name], MD     DATE OF BIRTH:  01-Feb-1946  DATE OF PROCEDURE:  03/26/2015                             OPERATIVE REPORT   DIAGNOSES: 1. Large left renal stone. 2. Ureteropelvic junction obstruction. 3. Left renal cyst.  PROCEDURE: 1. Cystoscopy with left retrograde pyelogram interpretation. 2. Insertion of left ureteral stent 6 x 26 Contour, no tether. 3. Robotic-assisted laparoscopic left pyeloplasty. 4. Robotic-assisted pyelolithotomy. 5. Laparoscopic left renal cyst decortication.  ASSISTANT SURGEON:  Dr. Nicolette Bang.  ESTIMATED BLOOD LOSS:  150 mL.  COMPLICATIONS:  None.  SPECIMENS: 1. Multifocal left renal stones for compositional analysis. 2. Left renal cyst wall to permanent pathology.  FINDINGS: 1. Significant inflammatory desmoplastic response around the left     renal pelvis and UPJ consistent with recurrent infection and stone. 2. Multifocal accessory crossing vessels in the area of the UPJ, some     anterior and posterior. 3. Ligation of 2 small anterior crossing vessels, felt to represent     nidus UPJ obstruction, these were quite small in caliber. 4. V-Y plasty reconstruction of the UPJ following pyelolithotomy. 5. A thin walled simple-appearing left lateral renal cyst.  DRAINS: 1. Jackson-Pratt drain to bulb suction. 2. Foley catheter to straight drain.  INDICATION:  Luis [name] is a very pleasant 69 year old gentleman, who on workup of left flank pain and hematuria was noted to have a very large volume left intrarenal stone, the dominant UPJ stone is smaller pole stone in a relatively bifid kidney, hydronephrosis worrisome for possible UPJ obstruction, also had a simple-appearing large left renal cyst.  Options were discussed for management including nephrectomy versus  pyeloplasty, pyelolithotomy versus endoscopic management.  The patient wished to proceed with robotic pyeloplasty with pyelolithotomy. Given ipsilateral renal surgery, it was felt that renal cyst decortication would be warranted for tissue sampling and he wished to proceed.  Informed consent was obtained and placed in medical record.  PROCEDURE IN DETAIL:  The patient being Luis [name] was verified. Procedure being left pyeloplasty, pyelolithotomy, ureteral stent placement, and cyst decortication was confirmed.  Procedure was carried out.  Time-out was performed.  Intravenous antibiotics were administered.  General endotracheal anesthesia was introduced.  The patient was initially placed into a low lithotomy position.  Sterile field was created by prepping and draping the patient's penis, perineum, and proximal thighs using iodine x3.  Next, cystourethroscopy was performed using 22-French rigid cystoscope with a 30-degree offset lens. Inspection of the anterior and posterior urethra were unremarkable. Inspection of the urinary bladder revealed no diverticula, calcifications, or prior lesions.  The left ureteral orifice was cannulated with 6-French end-hole catheter and left retrograde pyelogram was obtained.  Left retrograde pyelogram demonstrated single left ureter, single system left kidney.  There was a significant step-off at the area of the UPJ without obvious intraluminal lesions consistent with likely extrinsic compression and UPJ obstruction.  There was a large filling defect in the renal pelvis and smaller filling defects in the upper pole of relatively bifid kidney consistent with  known stone.  A 0.038 zip wire was advanced to the level of the upper pole of the bifid system over which a 6 x 26 Contour type stent was placed, proximal and upper pole, distal in urinary bladder.  Cystoscope was exchanged for a 16-French Foley catheter placed per urethra to straight drain, 10 mL  shorter than balloon.  The patient was repositioned into a left side up full flank position employing 15 degrees stable flexion, superior arm elevator, axillary roll, sequential compression devices, bean bag, bottom leg bent, top leg straight.  Superior arm elevator was also used.  He was further fashioned on the operative table using 3-inch tape over foam padding across his xiphoid area and pelvis.  Sterile field was created by first clipper shaving and prepping and draping the patient's entire left flank and abdomen using chlorhexidine gluconate.  Next, a high-flow low-pressure pneumoperitoneum was obtained using Veress technique in the left lower quadrant having passed the aspiration and drop test.  A 12-mm robotic camera port was then placed in position approximately 4 fingerbreadths superior and lateral to the umbilicus.  Laparoscopic examination of the peritoneal cavity revealed no significant adhesions, no visceral injury.  Additional ports were placed as follows; left subcostal 8-mm robotic port, left far lateral 8-mm robotic port 4 fingerbreadths superior and medial to the anterior iliac spine, left paramedian inferior robotic port, 12-mm assistant port in the midline just superior to the umbilicus, and 5-mm assistant port in the midline 3 fingerbreadths superior to the camera port.  Robot was docked and passed through the electronic checks.  Initial attention was directed at development of retroperitoneum.  Incision was made lateral to the descending colon from the splenic flexure towards the area of the internal ring.  The colon was carefully swept medially.  Lower pole of the kidney was identified, placed on gentle lateral traction. Dissection proceeded medial to this.  Ureter was encountered, marked with vessel loop.  Dissection proceeded circumferentially around the ureter superiorly towards the area of the UPJ.  The patient was noted to have multiple accessory vessels  including several vessels extending from the contralateral iliacs, therefore, very careful sequential mobilization of the ureter was performed towards the area of the UPJ. There were 2 very small crossing vessels anterior.  There was also some larger posterior vessels.  Two at the anterior ones were felt to be likely the source of obstruction given the very small caliber and a significant inflammatory response around the UPJ, it was felt that this technique would not be advantageous and the small vessels were just simply ligated with cold clips.  The renal pelvis was then incised for a distance of approximately 3.5 cm on its medial edge from the area of the UPJ superiorly below the area of the main renal hilum.  The large dominant UPJ stone was encountered.  This was carefully navigated with robotic guidance and placed into an EndoCatch bag for later retrieval. The intrarenal anatomy was bifid as expected and the upper pole stone was not easily accessible via robotic technique.  As such, a 16-French flexible cystoscope was placed through the inferior robotic port site into the area of the peritoneal cavity using robotic guidance to the area of the pyelotomy.  Second surgeon performed flexible endoscopy of the kidney.  In the upper pole, I was able identify the stone, it was grasped with an Escape basket and removed in its entirety thus removing all the expected large stone material.  The renal pelvis was reapproximated  using running 3-0 V-Loc from the superior to inferior approach single running suture lines.  Approximately 4 mm above the area of the UPJ, this was tied off and the V-Y plasty technique was performed by placing a U stitch across the inferior aspect of the lateral UPJ towards the previous suture line thus performing a V-Y plasty and opening up the area of the UPJ further to allow more anatomic drainage. This resulted in excellent anatomic re-configuration of the UPJ.  It  was grossly water tight.  The proximal of the stent was visualized during these maneuvers and not moved appreciably.  Retroperitoneal fat was reapproximated over the area of the UPJ.  Attention was then directed at renal cyst decortication.  The lateral aspect of the kidney was carefully mobilized by dissecting directly on the kidney parenchyma and the cyst in question was identified, appeared to be simple and thin- walled.  The wall was decorticated and set aside for permanent pathology.  The base of this was fulgurated.  Hemostasis appeared excellent.  Sponge and needle counts were correct.  The previous vessel loop was removed.  Closed suction drain was brought out through the previous left lateral most robotic port site to the peritoneal cavity. Robot was then undocked and the EndoCatch bag containing the dominant stone was removed via the assistant port site.  The assistant port 12 mm and robotic 12 mm port sites were closed at the level of the fascia using figure-of-eight Vicryl over the fascia.  All incision sites were then infiltrated with dilute lyophilized Marcaine and closed at the level of the skin using subcuticular Monocryl followed by Dermabond. Closed suction drain had previously been positioned laparoscopically was sutured to the skin using nylon.  Procedure was terminated.  The patient tolerated the procedure well.  There were no immediate periprocedural complications.  The patient was taken to the postanesthesia care unit in stable condition.          ______________________________ [name], MD     TM/MEDQ  D:  03/26/2015  T:  03/27/2015  Job:  [id number]

## 2015-03-27 NOTE — Progress Notes (Signed)
Foley d/c'd per MD order tol proc well.SRP, RN

## 2015-03-28 MED ORDER — OXYCODONE-ACETAMINOPHEN 5-325 MG PO TABS: 1.0000 | ORAL_TABLET | Freq: Four times a day (QID) | ORAL | Status: DC | PRN

## 2015-03-28 MED ORDER — SENNOSIDES-DOCUSATE SODIUM 8.6-50 MG PO TABS: 1.0000 | ORAL_TABLET | Freq: Two times a day (BID) | ORAL | Status: DC

## 2015-03-28 NOTE — Discharge Instructions (Signed)
1- Drain Sites - You may have some mild persistent drainage from old drain site for several days, this is normal. This can be covered with cotton gauze for convenience. ° °2 - Stiches - Your stitches are all dissolvable. You may notice a "loose thread" at your incisions, these are normal and require no intervention. You may cut them flush to the skin with fingernail clippers if needed for comfort. ° °3 - Diet - No restrictions ° °4 - Activity - No heavy lifting / straining (any activities that require valsalva or "bearing down") x 4 weeks. Otherwise, no restrictions. ° °5 - Bathing - You may shower immediately. ° °6 -  When to Call the Doctor - Call MD for any fever >102, any acute wound problems, or any severe nausea / vomiting. You can call the [facility] ([phone]) 24 hours a day 365 days a year. It will roll-over to the answering service and on-call physician after hours. ° °

## 2015-03-28 NOTE — Progress Notes (Signed)
DC instructions reviewed with patient. Home med rec reviewed as well. F/u in place for 7/11. 2 Rx's given. Patient denies questions or concerns at this time. No changes noted since am assessment. Patient voiding and pain controlled on PO analgesics. Patient to DC to home with spouse.

## 2015-03-28 NOTE — Discharge Summary (Signed)
Physician Discharge Summary  Patient ID: Luis [name] MRN: [id number] DOB/AGE: [birth year] 69 y.o.  Admit date: 03/26/2015 Discharge date: 03/28/2015  Admission Diagnoses: Left UPJ Obstruction, Left Large Renal Stones, Left Renal Cyst  Discharge Diagnoses:   Left UPJ Obstruction, Left Large Renal Stones, Left Renal Cyst status post repair / removal  Discharged Condition: good  Hospital Course:   1 - Left UPJ Obstruction with Large Left Renal Stone and Left Renal Cyst - s/p cysto left ureteral stent placemtn and robotic left pyeloplasty + pyelolithotomy + cyst decortication on 03/26/15 the day of admission. Foley removed POD 1 and voided w/o issue. JP removed AM of POD 2 as output scant (<22mL in 24 hrs). By POD 2, the day of discharge, he is ambulatory, pain controlled on PO meds, and felt to be adequate for discharge, final cyst wall pathology benign.   Consults: None  Significant Diagnostic Studies: labs: Hgb >12, Cr <1.5.  Treatments: surgery: cysto left ureteral stent placemtn and robotic left pyeloplasty + pyelolithotomy + cyst decortication on 03/26/15  Discharge Exam: Blood pressure 135/73, pulse 99, temperature 98.7 F (37.1 C), temperature source Oral, resp. rate 18, height 5' 7.5" (1.715 m), weight 83.462 kg (184 lb), SpO2 94 %. General appearance: alert, cooperative, appears stated age and wife at bedside Head: Normocephalic, without obvious abnormality, atraumatic Eyes: negative Back: symmetric, no curvature. ROM normal. No CVA tenderness. Resp: non-labored on room air Cardio: mild regular tachycardia.  GI: soft, non-tender; bowel sounds normal; no masses,  no organomegaly Male genitalia: normal Extremities: extremities normal, atraumatic, no cyanosis or edema Skin: Skin color, texture, turgor normal. No rashes or lesions Neurologic: Grossly normal Incision/Wound: recent port sites c/d/i. JP with scant serous output, removed and dry dressing applied.   Disposition:  Final discharge disposition not confirmed     Medication List    TAKE these medications        CRESTOR 20 MG tablet  Generic drug:  rosuvastatin  Take 20 mg by mouth every morning.     oxyCODONE-acetaminophen 5-325 MG per tablet  Commonly known as:  ROXICET  Take 1-2 tablets by mouth every 6 (six) hours as needed for moderate pain or severe pain. Post-operatively     senna-docusate 8.6-50 MG per tablet  Commonly known as:  Senokot-S  Take 1 tablet by mouth 2 (two) times daily. While taking pain meds to prevent constipation           Follow-up Information    Follow up with [name], MD On 04/09/2015.   Specialty:  Urology   Why:  as previously scheduled for MD visit. Feel free to call office to confirm time.    Contact information:   [place] [address] [phone]       Signed: [name] 03/28/2015, 6:54 AM

## 2015-06-20 ENCOUNTER — Ambulatory Visit
Admission: RE | Admit: 2015-06-20 | Discharge: 2015-06-20 | Disposition: A | Discharge status: Home / Self Care | Payer: Medicare Other | Source: Ambulatory Visit | Attending: Internal Medicine | Admitting: Internal Medicine

## 2015-06-20 ENCOUNTER — Other Ambulatory Visit: Payer: Self-pay | Admitting: Internal Medicine

## 2015-06-20 DIAGNOSIS — R05 Cough: Secondary | ICD-10-CM

## 2015-06-20 DIAGNOSIS — R059 Cough, unspecified: Secondary | ICD-10-CM

## 2016-04-23 ENCOUNTER — Ambulatory Visit
Admission: RE | Admit: 2016-04-23 | Discharge: 2016-04-23 | Disposition: A | Discharge status: Home / Self Care | Payer: Medicare Other | Source: Ambulatory Visit | Attending: Orthopaedic Surgery | Admitting: Orthopaedic Surgery

## 2016-04-23 ENCOUNTER — Other Ambulatory Visit: Payer: Self-pay | Admitting: Orthopaedic Surgery

## 2016-04-23 DIAGNOSIS — M47816 Spondylosis without myelopathy or radiculopathy, lumbar region: Secondary | ICD-10-CM

## 2017-03-24 DIAGNOSIS — I25119 Atherosclerotic heart disease of native coronary artery with unspecified angina pectoris: Secondary | ICD-10-CM

## 2017-03-24 NOTE — H&P (Signed)
OFFICE VISIT NOTES COPIED TO EPIC FOR DOCUMENTATION  . History of Present Illness Andria Frames K. Vyas MD; 03-24-2017 8:58 PM) Patient words: NP EVAL for Abn EKG, CP.  The patient is a 71 year old male presenting for an evaluation of an abnormal EKG. Mr. Heisler is 71 years old Asian [name] male. Patient has felt epigastric pain and lower substernal discomfort off and on. It is not related to exertion. For one week, he also had nausea associated with these symptoms. No history of radiation, no associated dyspnea or diaphoresis.  No complaints of shortness of breath, orthopnea or PND. No history of palpitation, dizziness, near-syncope or syncope. No history of swelling on the legs and no claudication.  No history of hypertension. He has prediabetes. He also has hypercholesterolemia, patient was given Crestor which he took for about one year and then stopped 2 months ago. He does not smoke. He usually walks for couple of miles a day.  No history of thyroid problems. No history of TIA or CVA. He has h/o kidney stones.   Problem List/Past Medical Despina Hick, MD; Mar 24, 2017 7:24 PM) Kidney stone (N20.0)  Prediabetes (R73.03)  01/10/2017- HbA1c- 6.1  Allergies (April Harrington; 2017/03/24 3:15 PM) No Known Drug Allergies [03-24-2017]:  Family History (April Harrington; 03-24-2017 3:18 PM) Mother  Deceased. at age 62 natural causes, no heart attacks or strokes, no known cardiovascular conditions Father  Deceased. at age 32 from natural causes, no heart attacks or strokes, no known cardiovascular conditions Brother 1  35 younger, deceased natural causes Sister 11/04/78 deceased from natural causes  Social History (April Harrington; 03-24-17 3:16 PM) Current tobacco use  Non Drinker/No Alcohol Use  Marital status  Married. Living Situation  Lives with spouse. Number of Children   Past Surgical History (April Harrington; 03-24-17 3:21 PM) kidney stone removal  [02/2015]:  Medication History (April Harrington; March 24, 2017 3:23 PM) No Current Medications Medications Reconciled  Diagnostic Studies History (April Harrington; 2017-03-24 8:58 PM) Lothotripsy [2016]: kidney stones Colonoscopy [2013]: Normal. Echocardiogram [2017]: Done in [place], told to be normal. Abdominal Ultrasound [2017]: Normal.    Review of Systems Andria Frames K. Vyas MD; 03-24-17 8:29 PM)  Note: GENERAL- Not feeling tired or fatigue, No fever, chills. No recent weight change. CARDIO VASCULAR- Has atypical chest pain, No shortness of breath, orthopnea or PND. No palpitation, dizziness, fainting. No hypertension. Has h/o high cholesterol. No swelling on legs. No claudication in legs, No cramps. No h/o DVT PULMONARY- No cough, phlegm, wheezing, not feeling congested in chest. GASTROINTESTINAL- Has abdominal pain, nausea, No vomiting or diarrhea. No dark tarry stools. Normal appetite. No heartburn. No jaundice. ENDOCRINE- No Thyroid problem, No feeling of excessive heat or cold, No polydipsia or polyuria. Has Pre Diabetes. NEUROLOGICAL- No focal motor or sensory symptoms, Good coordination. No seizures. MUSCULOSKELETAL- No generalized myalgias or muscle weakness. No joint swelling SKIN- No skin rash, No pruritus HEMATOLOGY- No anemia, petechiae, excessive bruising, epistaxis, GI bleed or any abnormal bleeding.   Vitals (April Harrington; 03/24/17 3:24 PM) 03/24/2017 3:13 PM Weight: 187.31 lb Height: 67.5in Body Surface Area: 1.98 m Body Mass Index: 28.9 kg/m  Pulse: 87 (Regular)  P.OX: 97% (Room air) BP: 104/62 (Sitting, Left Arm, Standard)       Physical Exam Andria Frames K. Vyas MD; 03/24/2017 8:29 PM) The physical exam findings are as follows: Note:GENERAL APPEARANCE- Alert, Oriented. Well built, has central obesity. HEENT- Unremarkable, fundi were not examined. NECK- No JVD. Carotid pulses are 2+, No bruits audible. No  thyromegaly. No  lymphadenopathy. HEART- Palpation- Apex beat is palpable in left 5th intercostal space, inside midclavicular line. No heave or thrills palpable. Auscultation- Normal S1, S2. No gallops or murmurs audible. CHEST- Normal shape. Normal percussion. Auscultation- Normal breath sounds, No crepitations. No wheezing. ABDOMEN- Palpation- Soft, Nontender. No hepatosplenomegaly. No masses felt. Auscultation- Normal bowel sounds. No bruits audible. EXTREMITIES- No Clubbing or Cyanosis. No edema on legs or feet. PERIPHERAL PULSES- Both femoral pulses- 2+, No bruits audible. Both dorsalis pedis pulses- 2+, Both posterior tibial pulses- 2+    Assessment & Plan Andria Frames K. Vyas MD; 03/17/2017 8:57 PM) Chest pain, atypical (R07.89) Current Plans Started Aspirin Adult Low Strength 81MG , 1 (one) Tablet daily, #30, 03/17/2017, No Refill. 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 Current Plans Started Rosuvastatin Calcium 40MG , 1 (one) Tablet daily, #30, 30 days starting 03/17/2017, Ref. x4. Laboratory examination (M84.13) Story: 01/10/2017-sodium-138, potassium-5.1, BUN-11, creatinine-1.1. Normal liver enzymes.  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  I have discussed my assessment with the patient. 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 Patient has atypical chest pain. He has coronary risk factors like hypercholesterolemia, age, male sex, prediabetes and has central obesity. We need to rule out ischemia.  Patient has been scheduled for Lexiscan Myoview scans.  His LDL cholesterol is quite high at 170. I have started him on high-dose rosuvastatin 40 mg daily. He was also advised to take enteric-coated aspirin 81 mg daily.  Patient was advised and given instructions to follow ADA, low-salt, low-cholesterol diet. He was encouraged to continue walking regularly.  I will see him in follow-up after the stress nuclear scans.  CC: Dr. [name].  Signed electronically by Despina Hick, MD (03/17/2017 8:59 PM)  Addendum: Leane Call stress test 03/23/2017: 1. The resting electrocardiogram demonstrated normal sinus rhythm, inferior and anteroseptal infarct, old. Non specific T inversion and no resting arrhythmias.  Stress EKG is non-diagnostic for ischemia as it a pharmacologic stress using Lexiscan. Stress symptoms included dyspnea. 2. There is a large area of infarction in the basal, mid and apical anteroseptal, inferoseptal, inferior, myocardial wall(s).  Gated SPECT imaging demonstrates hypokinesis of the basal inferoseptal, basal inferior, mid inferoseptal, mid inferior and apical inferior myocardial wall(s) without reversibility.  The left ventricular ejection fraction was calculated or visually estimated to be 29%.  This is a high risk study.  Dr Woody Seller discussed the findings of the stress test with the patient and due to very large infarct size, although there is no evidence of ischemia, as patient has no known coronary disease in the past, high risk stress test, he has recommended proceeding with cardiac catheterization to evaluate for CAD. Discussed risks, benefits and alternatives of angiogram including but not limited to <1% risk of death, stroke, MI, need for urgent surgical revascularization, renal failure, but not limited to thest. patient is willing to proceed.

## 2017-03-25 ENCOUNTER — Ambulatory Visit (HOSPITAL_COMMUNITY): Payer: Medicare Other | Admitting: Certified Registered Nurse Anesthetist

## 2017-03-25 ENCOUNTER — Encounter (HOSPITAL_COMMUNITY): Payer: Self-pay | Admitting: Cardiothoracic Surgery

## 2017-03-25 ENCOUNTER — Inpatient Hospital Stay (HOSPITAL_COMMUNITY)
Admission: AD | Admit: 2017-03-25 | Discharge: 2017-04-10 | DRG: 215 | Disposition: A | Discharge status: Home Health | Payer: Medicare Other | Source: Ambulatory Visit | Attending: Cardiothoracic Surgery | Admitting: Cardiothoracic Surgery

## 2017-03-25 ENCOUNTER — Inpatient Hospital Stay (HOSPITAL_COMMUNITY)
Admission: AD | Disposition: A | Discharge status: Home Health | Payer: Self-pay | Source: Ambulatory Visit | Attending: Cardiothoracic Surgery

## 2017-03-25 ENCOUNTER — Inpatient Hospital Stay (HOSPITAL_COMMUNITY): Payer: Medicare Other

## 2017-03-25 ENCOUNTER — Ambulatory Visit (HOSPITAL_COMMUNITY): Payer: Medicare Other

## 2017-03-25 ENCOUNTER — Encounter (HOSPITAL_COMMUNITY)
Admission: AD | Disposition: A | Discharge status: Home Health | Payer: Self-pay | Source: Ambulatory Visit | Attending: Cardiothoracic Surgery

## 2017-03-25 DIAGNOSIS — Z419 Encounter for procedure for purposes other than remedying health state, unspecified: Secondary | ICD-10-CM

## 2017-03-25 DIAGNOSIS — Z95811 Presence of heart assist device: Secondary | ICD-10-CM | POA: Diagnosis not present

## 2017-03-25 DIAGNOSIS — D62 Acute posthemorrhagic anemia: Secondary | ICD-10-CM | POA: Diagnosis not present

## 2017-03-25 DIAGNOSIS — J9811 Atelectasis: Secondary | ICD-10-CM | POA: Diagnosis not present

## 2017-03-25 DIAGNOSIS — Z87442 Personal history of urinary calculi: Secondary | ICD-10-CM

## 2017-03-25 DIAGNOSIS — I2542 Coronary artery dissection: Secondary | ICD-10-CM | POA: Diagnosis not present

## 2017-03-25 DIAGNOSIS — I5021 Acute systolic (congestive) heart failure: Secondary | ICD-10-CM | POA: Diagnosis not present

## 2017-03-25 DIAGNOSIS — Q211 Atrial septal defect: Secondary | ICD-10-CM

## 2017-03-25 DIAGNOSIS — J810 Acute pulmonary edema: Secondary | ICD-10-CM | POA: Diagnosis not present

## 2017-03-25 DIAGNOSIS — I2510 Atherosclerotic heart disease of native coronary artery without angina pectoris: Secondary | ICD-10-CM | POA: Diagnosis present

## 2017-03-25 DIAGNOSIS — E7800 Pure hypercholesterolemia, unspecified: Secondary | ICD-10-CM | POA: Diagnosis present

## 2017-03-25 DIAGNOSIS — J95811 Postprocedural pneumothorax: Secondary | ICD-10-CM | POA: Diagnosis not present

## 2017-03-25 DIAGNOSIS — R072 Precordial pain: Secondary | ICD-10-CM | POA: Diagnosis present

## 2017-03-25 DIAGNOSIS — E785 Hyperlipidemia, unspecified: Secondary | ICD-10-CM | POA: Diagnosis present

## 2017-03-25 DIAGNOSIS — E1122 Type 2 diabetes mellitus with diabetic chronic kidney disease: Secondary | ICD-10-CM | POA: Diagnosis present

## 2017-03-25 DIAGNOSIS — I252 Old myocardial infarction: Secondary | ICD-10-CM

## 2017-03-25 DIAGNOSIS — E11649 Type 2 diabetes mellitus with hypoglycemia without coma: Secondary | ICD-10-CM | POA: Diagnosis present

## 2017-03-25 DIAGNOSIS — I480 Paroxysmal atrial fibrillation: Secondary | ICD-10-CM | POA: Diagnosis not present

## 2017-03-25 DIAGNOSIS — I25110 Atherosclerotic heart disease of native coronary artery with unstable angina pectoris: Secondary | ICD-10-CM | POA: Diagnosis not present

## 2017-03-25 DIAGNOSIS — Z951 Presence of aortocoronary bypass graft: Secondary | ICD-10-CM | POA: Diagnosis not present

## 2017-03-25 DIAGNOSIS — J9601 Acute respiratory failure with hypoxia: Secondary | ICD-10-CM | POA: Diagnosis not present

## 2017-03-25 DIAGNOSIS — J939 Pneumothorax, unspecified: Secondary | ICD-10-CM

## 2017-03-25 DIAGNOSIS — I214 Non-ST elevation (NSTEMI) myocardial infarction: Secondary | ICD-10-CM | POA: Diagnosis not present

## 2017-03-25 DIAGNOSIS — N182 Chronic kidney disease, stage 2 (mild): Secondary | ICD-10-CM | POA: Diagnosis present

## 2017-03-25 DIAGNOSIS — R079 Chest pain, unspecified: Secondary | ICD-10-CM | POA: Diagnosis not present

## 2017-03-25 DIAGNOSIS — R570 Cardiogenic shock: Secondary | ICD-10-CM | POA: Diagnosis not present

## 2017-03-25 DIAGNOSIS — I25119 Atherosclerotic heart disease of native coronary artery with unspecified angina pectoris: Secondary | ICD-10-CM

## 2017-03-25 DIAGNOSIS — E871 Hypo-osmolality and hyponatremia: Secondary | ICD-10-CM | POA: Diagnosis not present

## 2017-03-25 DIAGNOSIS — K760 Fatty (change of) liver, not elsewhere classified: Secondary | ICD-10-CM | POA: Diagnosis present

## 2017-03-25 DIAGNOSIS — E876 Hypokalemia: Secondary | ICD-10-CM | POA: Diagnosis not present

## 2017-03-25 DIAGNOSIS — Z9689 Presence of other specified functional implants: Secondary | ICD-10-CM

## 2017-03-25 DIAGNOSIS — N179 Acute kidney failure, unspecified: Secondary | ICD-10-CM | POA: Diagnosis not present

## 2017-03-25 DIAGNOSIS — G4730 Sleep apnea, unspecified: Secondary | ICD-10-CM | POA: Diagnosis present

## 2017-03-25 DIAGNOSIS — Z720 Tobacco use: Secondary | ICD-10-CM

## 2017-03-25 DIAGNOSIS — D689 Coagulation defect, unspecified: Secondary | ICD-10-CM | POA: Diagnosis not present

## 2017-03-25 DIAGNOSIS — E872 Acidosis: Secondary | ICD-10-CM | POA: Diagnosis not present

## 2017-03-25 DIAGNOSIS — I472 Ventricular tachycardia: Secondary | ICD-10-CM | POA: Diagnosis not present

## 2017-03-25 DIAGNOSIS — R0602 Shortness of breath: Secondary | ICD-10-CM

## 2017-03-25 HISTORY — PX: LEFT HEART CATH AND CORONARY ANGIOGRAPHY: CATH118249

## 2017-03-25 HISTORY — PX: TEE WITHOUT CARDIOVERSION: SHX5443

## 2017-03-25 HISTORY — PX: ASD REPAIR: SHX258

## 2017-03-25 HISTORY — PX: CORONARY ARTERY BYPASS GRAFT: SHX141

## 2017-03-25 HISTORY — PX: PLACEMENT OF IMPELLA LEFT VENTRICULAR ASSIST DEVICE: SHX6519

## 2017-03-25 LAB — POCT I-STAT, CHEM 8
BUN: 14 mg/dL (ref 6–20)
BUN: 12 mg/dL (ref 6–20)
BUN: 13 mg/dL (ref 6–20)
BUN: 12 mg/dL (ref 6–20)
BUN: 14 mg/dL (ref 6–20)
BUN: 10 mg/dL (ref 6–20)
Calcium, Ion: 1.14 mmol/L — ABNORMAL LOW (ref 1.15–1.40)
Calcium, Ion: 0.83 mmol/L — CL (ref 1.15–1.40)
Calcium, Ion: 1.08 mmol/L — ABNORMAL LOW (ref 1.15–1.40)
Calcium, Ion: 0.92 mmol/L — ABNORMAL LOW (ref 1.15–1.40)
Calcium, Ion: 1.12 mmol/L — ABNORMAL LOW (ref 1.15–1.40)
Calcium, Ion: 1.09 mmol/L — ABNORMAL LOW (ref 1.15–1.40)
Chloride: 103 mmol/L (ref 101–111)
Chloride: 102 mmol/L (ref 101–111)
Chloride: 94 mmol/L — ABNORMAL LOW (ref 101–111)
Chloride: 102 mmol/L (ref 101–111)
Chloride: 102 mmol/L (ref 101–111)
Chloride: 104 mmol/L (ref 101–111)
Creatinine, Ser: 0.9 mg/dL (ref 0.61–1.24)
Creatinine, Ser: 0.6 mg/dL — ABNORMAL LOW (ref 0.61–1.24)
Creatinine, Ser: 0.9 mg/dL (ref 0.61–1.24)
Creatinine, Ser: 0.8 mg/dL (ref 0.61–1.24)
Creatinine, Ser: 0.9 mg/dL (ref 0.61–1.24)
Creatinine, Ser: 1 mg/dL (ref 0.61–1.24)
Glucose, Bld: 237 mg/dL — ABNORMAL HIGH (ref 65–99)
Glucose, Bld: 222 mg/dL — ABNORMAL HIGH (ref 65–99)
Glucose, Bld: 250 mg/dL — ABNORMAL HIGH (ref 65–99)
Glucose, Bld: 199 mg/dL — ABNORMAL HIGH (ref 65–99)
Glucose, Bld: 234 mg/dL — ABNORMAL HIGH (ref 65–99)
Glucose, Bld: 201 mg/dL — ABNORMAL HIGH (ref 65–99)
HCT: 47 % (ref 39.0–52.0)
HCT: 29 % — ABNORMAL LOW (ref 39.0–52.0)
HCT: 43 % (ref 39.0–52.0)
HCT: 27 % — ABNORMAL LOW (ref 39.0–52.0)
HCT: 28 % — ABNORMAL LOW (ref 39.0–52.0)
HCT: 23 % — ABNORMAL LOW (ref 39.0–52.0)
Hemoglobin: 16 g/dL (ref 13.0–17.0)
Hemoglobin: 14.6 g/dL (ref 13.0–17.0)
Hemoglobin: 9.9 g/dL — ABNORMAL LOW (ref 13.0–17.0)
Hemoglobin: 9.2 g/dL — ABNORMAL LOW (ref 13.0–17.0)
Hemoglobin: 9.5 g/dL — ABNORMAL LOW (ref 13.0–17.0)
Hemoglobin: 7.8 g/dL — ABNORMAL LOW (ref 13.0–17.0)
Potassium: 4.4 mmol/L (ref 3.5–5.1)
Potassium: 4.3 mmol/L (ref 3.5–5.1)
Potassium: 4.8 mmol/L (ref 3.5–5.1)
Potassium: 5.4 mmol/L — ABNORMAL HIGH (ref 3.5–5.1)
Potassium: 3.9 mmol/L (ref 3.5–5.1)
Potassium: 3 mmol/L — ABNORMAL LOW (ref 3.5–5.1)
Sodium: 138 mmol/L (ref 135–145)
Sodium: 136 mmol/L (ref 135–145)
Sodium: 139 mmol/L (ref 135–145)
Sodium: 138 mmol/L (ref 135–145)
Sodium: 140 mmol/L (ref 135–145)
Sodium: 142 mmol/L (ref 135–145)
TCO2: 22 mmol/L (ref 0–100)
TCO2: 25 mmol/L (ref 0–100)
TCO2: 29 mmol/L (ref 0–100)
TCO2: 27 mmol/L (ref 0–100)
TCO2: 23 mmol/L (ref 0–100)
TCO2: 19 mmol/L (ref 0–100)

## 2017-03-25 LAB — POCT I-STAT 3, ART BLOOD GAS (G3+)
Acid-Base Excess: 2 mmol/L (ref 0.0–2.0)
Acid-base deficit: 2 mmol/L (ref 0.0–2.0)
Acid-base deficit: 10 mmol/L — ABNORMAL HIGH (ref 0.0–2.0)
Acid-base deficit: 4 mmol/L — ABNORMAL HIGH (ref 0.0–2.0)
Acid-base deficit: 1 mmol/L (ref 0.0–2.0)
Acid-base deficit: 4 mmol/L — ABNORMAL HIGH (ref 0.0–2.0)
Bicarbonate: 20.3 mmol/L (ref 20.0–28.0)
Bicarbonate: 18.7 mmol/L — ABNORMAL LOW (ref 20.0–28.0)
Bicarbonate: 24.6 mmol/L (ref 20.0–28.0)
Bicarbonate: 28.9 mmol/L — ABNORMAL HIGH (ref 20.0–28.0)
Bicarbonate: 23.9 mmol/L (ref 20.0–28.0)
Bicarbonate: 22.8 mmol/L (ref 20.0–28.0)
O2 Saturation: 99 %
O2 Saturation: 91 %
O2 Saturation: 100 %
O2 Saturation: 89 %
O2 Saturation: 96 %
O2 Saturation: 84 %
Patient temperature: 36.7
TCO2: 21 mmol/L (ref 0–100)
TCO2: 20 mmol/L (ref 0–100)
TCO2: 26 mmol/L (ref 0–100)
TCO2: 31 mmol/L (ref 0–100)
TCO2: 25 mmol/L (ref 0–100)
TCO2: 24 mmol/L (ref 0–100)
pCO2 arterial: 28.5 mmHg — ABNORMAL LOW (ref 32.0–48.0)
pCO2 arterial: 51.7 mmHg — ABNORMAL HIGH (ref 32.0–48.0)
pCO2 arterial: 56.9 mmHg — ABNORMAL HIGH (ref 32.0–48.0)
pCO2 arterial: 57.4 mmHg — ABNORMAL HIGH (ref 32.0–48.0)
pCO2 arterial: 41.5 mmHg (ref 32.0–48.0)
pCO2 arterial: 47.7 mmHg (ref 32.0–48.0)
pH, Arterial: 7.46 — ABNORMAL HIGH (ref 7.350–7.450)
pH, Arterial: 7.165 — CL (ref 7.350–7.450)
pH, Arterial: 7.244 — ABNORMAL LOW (ref 7.350–7.450)
pH, Arterial: 7.309 — ABNORMAL LOW (ref 7.350–7.450)
pH, Arterial: 7.367 (ref 7.350–7.450)
pH, Arterial: 7.285 — ABNORMAL LOW (ref 7.350–7.450)
pO2, Arterial: 139 mmHg — ABNORMAL HIGH (ref 83.0–108.0)
pO2, Arterial: 78 mmHg — ABNORMAL LOW (ref 83.0–108.0)
pO2, Arterial: 366 mmHg — ABNORMAL HIGH (ref 83.0–108.0)
pO2, Arterial: 66 mmHg — ABNORMAL LOW (ref 83.0–108.0)
pO2, Arterial: 86 mmHg (ref 83.0–108.0)
pO2, Arterial: 54 mmHg — ABNORMAL LOW (ref 83.0–108.0)

## 2017-03-25 LAB — LACTATE DEHYDROGENASE: LDH: 142 U/L (ref 98–192)

## 2017-03-25 LAB — PREPARE RBC (CROSSMATCH)

## 2017-03-25 LAB — HEMOGLOBIN AND HEMATOCRIT, BLOOD
HCT: 29.6 % — ABNORMAL LOW (ref 39.0–52.0)
Hemoglobin: 9.9 g/dL — ABNORMAL LOW (ref 13.0–17.0)

## 2017-03-25 LAB — CBC
HCT: 43 % (ref 39.0–52.0)
HCT: 29 % — ABNORMAL LOW (ref 39.0–52.0)
HCT: 25.1 % — ABNORMAL LOW (ref 39.0–52.0)
Hemoglobin: 14.3 g/dL (ref 13.0–17.0)
Hemoglobin: 9.6 g/dL — ABNORMAL LOW (ref 13.0–17.0)
Hemoglobin: 8.4 g/dL — ABNORMAL LOW (ref 13.0–17.0)
MCH: 29.6 pg (ref 26.0–34.0)
MCH: 29.7 pg (ref 26.0–34.0)
MCH: 30 pg (ref 26.0–34.0)
MCHC: 33.3 g/dL (ref 30.0–36.0)
MCHC: 33.1 g/dL (ref 30.0–36.0)
MCHC: 33.5 g/dL (ref 30.0–36.0)
MCV: 89 fL (ref 78.0–100.0)
MCV: 89.8 fL (ref 78.0–100.0)
MCV: 89.6 fL (ref 78.0–100.0)
Platelets: 242 10*3/uL (ref 150–400)
Platelets: 228 10*3/uL (ref 150–400)
Platelets: 189 10*3/uL (ref 150–400)
RBC: 4.83 MIL/uL (ref 4.22–5.81)
RBC: 3.23 MIL/uL — ABNORMAL LOW (ref 4.22–5.81)
RBC: 2.8 MIL/uL — ABNORMAL LOW (ref 4.22–5.81)
RDW: 14.7 % (ref 11.5–15.5)
RDW: 14.9 % (ref 11.5–15.5)
RDW: 15 % (ref 11.5–15.5)
WBC: 5.6 10*3/uL (ref 4.0–10.5)
WBC: 10.9 10*3/uL — ABNORMAL HIGH (ref 4.0–10.5)
WBC: 9.1 10*3/uL (ref 4.0–10.5)

## 2017-03-25 LAB — POCT I-STAT 4, (NA,K, GLUC, HGB,HCT)
Glucose, Bld: 196 mg/dL — ABNORMAL HIGH (ref 65–99)
HCT: 29 % — ABNORMAL LOW (ref 39.0–52.0)
Hemoglobin: 9.9 g/dL — ABNORMAL LOW (ref 13.0–17.0)
Potassium: 4.1 mmol/L (ref 3.5–5.1)
Sodium: 144 mmol/L (ref 135–145)

## 2017-03-25 LAB — BASIC METABOLIC PANEL
Anion gap: 7 (ref 5–15)
BUN: 11 mg/dL (ref 6–20)
CO2: 22 mmol/L (ref 22–32)
Calcium: 8.8 mg/dL — ABNORMAL LOW (ref 8.9–10.3)
Chloride: 109 mmol/L (ref 101–111)
Creatinine, Ser: 1.07 mg/dL (ref 0.61–1.24)
GFR calc Af Amer: >60 mL/min (ref >60)
GFR calc non Af Amer: >60 mL/min (ref >60)
Glucose, Bld: 114 mg/dL — ABNORMAL HIGH (ref 65–99)
Potassium: 4.1 mmol/L (ref 3.5–5.1)
Sodium: 138 mmol/L (ref 135–145)

## 2017-03-25 LAB — POCT ACTIVATED CLOTTING TIME
Activated Clotting Time: 235 seconds
Activated Clotting Time: 252 seconds
Activated Clotting Time: 131 seconds

## 2017-03-25 LAB — FIBRINOGEN: Fibrinogen: 237 mg/dL (ref 210–475)

## 2017-03-25 LAB — GLUCOSE, CAPILLARY
Glucose-Capillary: 185 mg/dL — ABNORMAL HIGH (ref 65–99)
Glucose-Capillary: 207 mg/dL — ABNORMAL HIGH (ref 65–99)
Glucose-Capillary: 209 mg/dL — ABNORMAL HIGH (ref 65–99)
Glucose-Capillary: 201 mg/dL — ABNORMAL HIGH (ref 65–99)

## 2017-03-25 LAB — PLATELET COUNT: Platelets: 180 10*3/uL (ref 150–400)

## 2017-03-25 LAB — SURGICAL PCR SCREEN
MRSA, PCR: NEGATIVE
Staphylococcus aureus: NEGATIVE

## 2017-03-25 LAB — CREATININE, SERUM
Creatinine, Ser: 1.3 mg/dL — ABNORMAL HIGH (ref 0.61–1.24)
GFR calc Af Amer: >60 mL/min (ref >60)
GFR calc non Af Amer: 54 mL/min — ABNORMAL LOW (ref >60)

## 2017-03-25 LAB — PROTIME-INR
INR: 0.98
INR: 1.3
Prothrombin Time: 13 seconds (ref 11.4–15.2)
Prothrombin Time: 16.3 seconds — ABNORMAL HIGH (ref 11.4–15.2)

## 2017-03-25 LAB — APTT: aPTT: 42 seconds — ABNORMAL HIGH (ref 24–36)

## 2017-03-25 LAB — ECHO INTRAOPERATIVE TEE
Height: 67.5 in
Weight: 2880 oz

## 2017-03-25 LAB — ABO/RH: ABO/RH(D): A POS

## 2017-03-25 LAB — MAGNESIUM: Magnesium: 2.7 mg/dL — ABNORMAL HIGH (ref 1.7–2.4)

## 2017-03-25 LAB — CARDIAC CATHETERIZATION: Cath EF Quantitative: 45 %

## 2017-03-25 SURGERY — LEFT HEART CATH AND CORONARY ANGIOGRAPHY
Anesthesia: LOCAL

## 2017-03-25 SURGERY — CORONARY ARTERY BYPASS GRAFTING (CABG)
Anesthesia: General | Site: Chest

## 2017-03-25 MED ORDER — MAGNESIUM SULFATE 50 % IJ SOLN
40.0000 meq | INTRAMUSCULAR | Status: DC
  Filled 2017-03-25: qty 10

## 2017-03-25 MED ORDER — ALBUMIN HUMAN 5 % IV SOLN
250.0000 mL | INTRAVENOUS | Status: AC | PRN
  Administered 2017-03-25 – 2017-03-26 (×6): 250 mL via INTRAVENOUS
  Filled 2017-03-25 (×5): qty 250

## 2017-03-25 MED ORDER — NITROGLYCERIN 1 MG/10 ML FOR IR/CATH LAB
INTRA_ARTERIAL | Status: AC
  Filled 2017-03-25: qty 10

## 2017-03-25 MED ORDER — HYDROMORPHONE HCL 1 MG/ML IJ SOLN
INTRAMUSCULAR | Status: AC
  Filled 2017-03-25: qty 0.5

## 2017-03-25 MED ORDER — MAGNESIUM SULFATE 4 GM/100ML IV SOLN
4.0000 g | Freq: Once | INTRAVENOUS | Status: AC
  Administered 2017-03-25: 4 g via INTRAVENOUS
  Filled 2017-03-25: qty 100

## 2017-03-25 MED ORDER — DEXTROSE 5 % IV SOLN
1.5000 g | Freq: Two times a day (BID) | INTRAVENOUS | Status: AC
  Administered 2017-03-25 – 2017-03-27 (×4): 1.5 g via INTRAVENOUS
  Filled 2017-03-25 (×4): qty 1.5

## 2017-03-25 MED ORDER — EPHEDRINE 5 MG/ML INJ
INTRAVENOUS | Status: AC
  Filled 2017-03-25: qty 10

## 2017-03-25 MED ORDER — DOPAMINE-DEXTROSE 3.2-5 MG/ML-% IV SOLN
0.0000 ug/kg/min | INTRAVENOUS | Status: DC
  Filled 2017-03-25: qty 250

## 2017-03-25 MED ORDER — NITROGLYCERIN IN D5W 200-5 MCG/ML-% IV SOLN
2.0000 ug/min | INTRAVENOUS | Status: AC
  Administered 2017-03-25: 5 ug/min via INTRAVENOUS
  Filled 2017-03-25: qty 250

## 2017-03-25 MED ORDER — PROPOFOL 10 MG/ML IV BOLUS
INTRAVENOUS | Status: DC | PRN
  Administered 2017-03-25: 50 mg via INTRAVENOUS

## 2017-03-25 MED ORDER — LACTATED RINGERS IV SOLN
INTRAVENOUS | Status: DC
  Administered 2017-03-25: 21:00:00 via INTRAVENOUS

## 2017-03-25 MED ORDER — HEMOSTATIC AGENTS (NO CHARGE) OPTIME
TOPICAL | Status: DC | PRN
  Administered 2017-03-25: 1 via TOPICAL

## 2017-03-25 MED ORDER — HEPARIN SODIUM (PORCINE) 5000 UNIT/ML IJ SOLN
50000.0000 [IU] | INTRAVENOUS | Status: DC
  Administered 2017-03-25: 50000 [IU]
  Filled 2017-03-25: qty 10

## 2017-03-25 MED ORDER — ALBUMIN HUMAN 5 % IV SOLN
INTRAVENOUS | Status: DC | PRN
  Administered 2017-03-25: 16:00:00 via INTRAVENOUS

## 2017-03-25 MED ORDER — AMIODARONE HCL IN DEXTROSE 360-4.14 MG/200ML-% IV SOLN
30.0000 mg/h | INTRAVENOUS | Status: DC
  Administered 2017-03-26: 30 mg/h via INTRAVENOUS
  Filled 2017-03-25: qty 200

## 2017-03-25 MED ORDER — IOPAMIDOL (ISOVUE-370) INJECTION 76%
INTRAVENOUS | Status: AC
  Filled 2017-03-25: qty 100

## 2017-03-25 MED ORDER — HYDROMORPHONE HCL 1 MG/ML IJ SOLN
INTRAMUSCULAR | Status: DC | PRN
  Administered 2017-03-25: 0.5 mg via INTRAVENOUS

## 2017-03-25 MED ORDER — LACTATED RINGERS IV SOLN: INTRAVENOUS | Status: DC

## 2017-03-25 MED ORDER — CHLORHEXIDINE GLUCONATE 0.12 % MT SOLN
15.0000 mL | OROMUCOSAL | Status: AC
  Administered 2017-03-25: 15 mL via OROMUCOSAL

## 2017-03-25 MED ORDER — FAMOTIDINE IN NACL 20-0.9 MG/50ML-% IV SOLN
20.0000 mg | Freq: Two times a day (BID) | INTRAVENOUS | Status: AC
  Administered 2017-03-25 (×2): 20 mg via INTRAVENOUS
  Filled 2017-03-25: qty 50

## 2017-03-25 MED ORDER — SODIUM CHLORIDE 0.9 % IV SOLN
INTRAVENOUS | Status: AC
  Administered 2017-03-25: 12.7 [IU]/h via INTRAVENOUS
  Administered 2017-03-26: 06:00:00 via INTRAVENOUS
  Administered 2017-03-26: 6.6 [IU]/h via INTRAVENOUS
  Filled 2017-03-25 (×2): qty 1

## 2017-03-25 MED ORDER — TRANEXAMIC ACID (OHS) BOLUS VIA INFUSION
15.0000 mg/kg | INTRAVENOUS | Status: AC
  Administered 2017-03-25: 1224 mg via INTRAVENOUS
  Filled 2017-03-25: qty 1224

## 2017-03-25 MED ORDER — DEXTROSE 5 % IV SOLN
750.0000 mg | INTRAVENOUS | Status: DC
  Filled 2017-03-25: qty 750

## 2017-03-25 MED ORDER — OXYCODONE HCL 5 MG PO TABS
5.0000 mg | ORAL_TABLET | ORAL | Status: DC | PRN
  Administered 2017-03-29 – 2017-04-10 (×17): 5 mg via ORAL
  Filled 2017-03-25 (×20): qty 1

## 2017-03-25 MED ORDER — ALBUMIN HUMAN 5 % IV SOLN: 250.0000 mL | INTRAVENOUS | Status: DC | PRN

## 2017-03-25 MED ORDER — PROTAMINE SULFATE 10 MG/ML IV SOLN
INTRAVENOUS | Status: DC | PRN
  Administered 2017-03-25: 10 mg via INTRAVENOUS

## 2017-03-25 MED ORDER — SODIUM CHLORIDE 0.9 % IV SOLN
250.0000 mL | INTRAVENOUS | Status: DC | PRN
  Administered 2017-03-25: 15:00:00 via INTRAVENOUS

## 2017-03-25 MED ORDER — POTASSIUM CHLORIDE 2 MEQ/ML IV SOLN
80.0000 meq | INTRAVENOUS | Status: DC
Start: 2017-03-26 — End: 2017-03-25
  Filled 2017-03-25: qty 40

## 2017-03-25 MED ORDER — SODIUM CHLORIDE 0.9 % IV SOLN
INTRAVENOUS | Status: DC
  Administered 2017-03-25: 07:00:00 via INTRAVENOUS
  Administered 2017-03-25: 100 mL via INTRAVENOUS

## 2017-03-25 MED ORDER — SODIUM CHLORIDE 0.9% FLUSH: 3.0000 mL | Freq: Two times a day (BID) | INTRAVENOUS | Status: DC

## 2017-03-25 MED ORDER — VANCOMYCIN HCL IN DEXTROSE 1-5 GM/200ML-% IV SOLN
1000.0000 mg | Freq: Once | INTRAVENOUS | Status: AC
  Administered 2017-03-25: 1000 mg via INTRAVENOUS
  Filled 2017-03-25: qty 200

## 2017-03-25 MED ORDER — VERAPAMIL HCL 2.5 MG/ML IV SOLN
INTRAVENOUS | Status: AC
  Filled 2017-03-25: qty 2

## 2017-03-25 MED ORDER — PHENYLEPHRINE HCL 10 MG/ML IJ SOLN
INTRAMUSCULAR | Status: DC | PRN
  Administered 2017-03-25 (×2): 80 ug via INTRAVENOUS

## 2017-03-25 MED ORDER — HEPARIN SODIUM (PORCINE) 1000 UNIT/ML IJ SOLN
INTRAMUSCULAR | Status: AC
  Filled 2017-03-25: qty 1

## 2017-03-25 MED ORDER — ACETAMINOPHEN 650 MG RE SUPP
650.0000 mg | Freq: Once | RECTAL | Status: AC
  Administered 2017-03-25: 650 mg via RECTAL

## 2017-03-25 MED ORDER — PHENYLEPHRINE HCL 10 MG/ML IJ SOLN
INTRAVENOUS | Status: DC | PRN
  Administered 2017-03-25: 10 ug/min via INTRAVENOUS

## 2017-03-25 MED ORDER — SODIUM BICARBONATE 8.4 % IV SOLN
INTRAVENOUS | Status: DC | PRN
  Administered 2017-03-25: 50 meq via INTRAVENOUS

## 2017-03-25 MED ORDER — SODIUM CHLORIDE 0.9 % IV SOLN
INTRAVENOUS | Status: DC
  Filled 2017-03-25: qty 30

## 2017-03-25 MED ORDER — ACETAMINOPHEN 500 MG PO TABS
1000.0000 mg | ORAL_TABLET | Freq: Four times a day (QID) | ORAL | Status: AC
  Administered 2017-03-28 – 2017-03-30 (×7): 1000 mg via ORAL
  Filled 2017-03-25 (×8): qty 2

## 2017-03-25 MED ORDER — HEPARIN (PORCINE) IN NACL 2-0.9 UNIT/ML-% IJ SOLN
INTRAMUSCULAR | Status: AC
  Filled 2017-03-25: qty 1000

## 2017-03-25 MED ORDER — NOREPINEPHRINE BITARTRATE 1 MG/ML IV SOLN
INTRAVENOUS | Status: AC
  Filled 2017-03-25: qty 4

## 2017-03-25 MED ORDER — AMIODARONE LOAD VIA INFUSION
150.0000 mg | Freq: Once | INTRAVENOUS | Status: AC
  Administered 2017-03-25: 150 mg via INTRAVENOUS
  Filled 2017-03-25: qty 83.34

## 2017-03-25 MED ORDER — LIDOCAINE 2% (20 MG/ML) 5 ML SYRINGE
INTRAMUSCULAR | Status: AC
  Filled 2017-03-25: qty 5

## 2017-03-25 MED ORDER — LACTATED RINGERS IV SOLN
INTRAVENOUS | Status: DC | PRN
  Administered 2017-03-25: 11:00:00 via INTRAVENOUS

## 2017-03-25 MED ORDER — DEXMEDETOMIDINE HCL IN NACL 400 MCG/100ML IV SOLN
0.1000 ug/kg/h | INTRAVENOUS | Status: DC
  Filled 2017-03-25: qty 100

## 2017-03-25 MED ORDER — MORPHINE SULFATE (PF) 4 MG/ML IV SOLN
2.0000 mg | INTRAVENOUS | Status: DC | PRN
  Administered 2017-03-25: 2 mg via INTRAVENOUS
  Administered 2017-03-26 (×5): 4 mg via INTRAVENOUS
  Filled 2017-03-25 (×7): qty 1

## 2017-03-25 MED ORDER — LIDOCAINE HCL (PF) 1 % IJ SOLN
INTRAMUSCULAR | Status: AC
  Filled 2017-03-25: qty 30

## 2017-03-25 MED ORDER — TRANEXAMIC ACID 1000 MG/10ML IV SOLN
1.5000 mg/kg/h | INTRAVENOUS | Status: AC
  Administered 2017-03-25: 1.5 mg/kg/h via INTRAVENOUS
  Filled 2017-03-25: qty 25

## 2017-03-25 MED ORDER — VERAPAMIL HCL 2.5 MG/ML IV SOLN
INTRA_ARTERIAL | Status: DC | PRN
  Administered 2017-03-25: 10 mL via INTRA_ARTERIAL

## 2017-03-25 MED ORDER — NOREPINEPHRINE BITARTRATE 1 MG/ML IV SOLN
INTRAVENOUS | Status: DC | PRN
  Administered 2017-03-25: 20 ug/min via INTRAVENOUS

## 2017-03-25 MED ORDER — PROPOFOL 10 MG/ML IV BOLUS
INTRAVENOUS | Status: AC
  Filled 2017-03-25: qty 20

## 2017-03-25 MED ORDER — 0.9 % SODIUM CHLORIDE (POUR BTL) OPTIME
TOPICAL | Status: DC | PRN
  Administered 2017-03-25: 5000 mL

## 2017-03-25 MED ORDER — MIDAZOLAM HCL 5 MG/5ML IJ SOLN
INTRAMUSCULAR | Status: DC | PRN
  Administered 2017-03-25 (×2): 4 mg via INTRAVENOUS
  Administered 2017-03-25: 2 mg via INTRAVENOUS

## 2017-03-25 MED ORDER — MIDAZOLAM HCL 2 MG/2ML IJ SOLN
2.0000 mg | INTRAMUSCULAR | Status: DC | PRN
  Administered 2017-03-25 – 2017-03-28 (×11): 2 mg via INTRAVENOUS
  Filled 2017-03-25 (×13): qty 2

## 2017-03-25 MED ORDER — ROSUVASTATIN CALCIUM 40 MG PO TABS
40.0000 mg | ORAL_TABLET | Freq: Every day | ORAL | Status: DC
  Administered 2017-03-28 – 2017-04-09 (×12): 40 mg via ORAL
  Filled 2017-03-25: qty 1
  Filled 2017-03-25: qty 2
  Filled 2017-03-25: qty 1
  Filled 2017-03-25 (×3): qty 2
  Filled 2017-03-25 (×2): qty 1
  Filled 2017-03-25: qty 2
  Filled 2017-03-25: qty 1
  Filled 2017-03-25: qty 2
  Filled 2017-03-25: qty 1
  Filled 2017-03-25: qty 2
  Filled 2017-03-25 (×2): qty 1
  Filled 2017-03-25: qty 4
  Filled 2017-03-25: qty 1
  Filled 2017-03-25: qty 2

## 2017-03-25 MED ORDER — DEXTROSE 5 % IV SOLN
1.5000 g | INTRAVENOUS | Status: AC
  Administered 2017-03-25: 1.5 g via INTRAVENOUS
  Administered 2017-03-25: .75 g via INTRAVENOUS
  Filled 2017-03-25: qty 1.5

## 2017-03-25 MED ORDER — NOREPINEPHRINE BITARTRATE 1 MG/ML IV SOLN
INTRAVENOUS | Status: AC | PRN
  Administered 2017-03-25: 10 ug/min via INTRAVENOUS

## 2017-03-25 MED ORDER — HEPARIN SODIUM (PORCINE) 1000 UNIT/ML IJ SOLN
INTRAMUSCULAR | Status: DC | PRN
  Administered 2017-03-25: 2 mL via INTRAVENOUS
  Administered 2017-03-25: 18 mL via INTRAVENOUS

## 2017-03-25 MED ORDER — SODIUM CHLORIDE 0.9 % IV SOLN
INTRAVENOUS | Status: DC | PRN
  Administered 2017-03-25: 3.5 [IU]/h via INTRAVENOUS

## 2017-03-25 MED ORDER — SODIUM CHLORIDE 0.45 % IV SOLN
INTRAVENOUS | Status: DC | PRN
  Administered 2017-03-27: 08:00:00 via INTRAVENOUS

## 2017-03-25 MED ORDER — METOPROLOL TARTRATE 5 MG/5ML IV SOLN: 2.5000 mg | INTRAVENOUS | Status: DC | PRN

## 2017-03-25 MED ORDER — ORAL CARE MOUTH RINSE
15.0000 mL | Freq: Four times a day (QID) | OROMUCOSAL | Status: DC
  Administered 2017-03-26 – 2017-03-29 (×13): 15 mL via OROMUCOSAL

## 2017-03-25 MED ORDER — ACETAMINOPHEN 160 MG/5ML PO SOLN
1000.0000 mg | Freq: Four times a day (QID) | ORAL | Status: AC
  Administered 2017-03-26 – 2017-03-29 (×8): 1000 mg
  Filled 2017-03-25 (×9): qty 40.6

## 2017-03-25 MED ORDER — ETOMIDATE 2 MG/ML IV SOLN
INTRAVENOUS | Status: DC | PRN
  Administered 2017-03-25: 16 mg via INTRAVENOUS

## 2017-03-25 MED ORDER — MORPHINE SULFATE (PF) 2 MG/ML IV SOLN: 2.0000 mg | INTRAVENOUS | Status: DC | PRN

## 2017-03-25 MED ORDER — TICAGRELOR 90 MG PO TABS
ORAL_TABLET | ORAL | Status: DC | PRN
  Administered 2017-03-25: 180 mg via ORAL

## 2017-03-25 MED ORDER — SODIUM CHLORIDE 0.9 % IV SOLN: 250.0000 mL | INTRAVENOUS | Status: DC

## 2017-03-25 MED ORDER — METOPROLOL TARTRATE 25 MG/10 ML ORAL SUSPENSION: 12.5000 mg | Freq: Two times a day (BID) | ORAL | Status: DC

## 2017-03-25 MED ORDER — SODIUM CHLORIDE 0.9 % IV SOLN
INTRAVENOUS | Status: DC | PRN
  Administered 2017-03-25: 0.2 ug/kg/h via INTRAVENOUS

## 2017-03-25 MED ORDER — SODIUM CHLORIDE 0.9% FLUSH: 3.0000 mL | INTRAVENOUS | Status: DC | PRN

## 2017-03-25 MED ORDER — HEPARIN SODIUM (PORCINE) 1000 UNIT/ML IJ SOLN
INTRAMUSCULAR | Status: DC | PRN
Start: 2017-03-25 — End: 2017-03-25
  Administered 2017-03-25: 3000 [IU] via INTRAVENOUS
  Administered 2017-03-25: 8000 [IU] via INTRAVENOUS

## 2017-03-25 MED ORDER — POTASSIUM CHLORIDE 10 MEQ/50ML IV SOLN: 10.0000 meq | INTRAVENOUS | Status: DC | PRN

## 2017-03-25 MED ORDER — DEXTROSE 5 % SOLN FOR IMPELLA PURGE CATHETER
INTRAVENOUS | Status: DC
  Filled 2017-03-25: qty 1000

## 2017-03-25 MED ORDER — HEPARIN (PORCINE) IN NACL 100-0.45 UNIT/ML-% IJ SOLN: 200.0000 [IU]/h | INTRAMUSCULAR | Status: DC

## 2017-03-25 MED ORDER — LACTATED RINGERS IV SOLN
INTRAVENOUS | Status: DC | PRN
  Administered 2017-03-25 (×2): via INTRAVENOUS

## 2017-03-25 MED ORDER — SODIUM CHLORIDE 0.9 % IV SOLN: Freq: Once | INTRAVENOUS | Status: DC

## 2017-03-25 MED ORDER — FENTANYL CITRATE (PF) 250 MCG/5ML IJ SOLN
INTRAMUSCULAR | Status: AC
  Filled 2017-03-25: qty 30

## 2017-03-25 MED ORDER — MILRINONE LACTATE IN DEXTROSE 20-5 MG/100ML-% IV SOLN
0.3000 ug/kg/min | INTRAVENOUS | Status: DC
  Administered 2017-03-25 – 2017-03-30 (×8): 0.3 ug/kg/min via INTRAVENOUS
  Filled 2017-03-25 (×3): qty 100
  Filled 2017-03-25: qty 200
  Filled 2017-03-25 (×5): qty 100

## 2017-03-25 MED ORDER — PLASMA-LYTE 148 IV SOLN
INTRAVENOUS | Status: AC
  Administered 2017-03-25: 500 mL
  Filled 2017-03-25: qty 2.5

## 2017-03-25 MED ORDER — ASPIRIN 81 MG PO CHEW
CHEWABLE_TABLET | ORAL | Status: AC
  Administered 2017-03-25: 81 mg via ORAL
  Filled 2017-03-25: qty 1

## 2017-03-25 MED ORDER — SODIUM CHLORIDE 0.9 % IV SOLN
0.0000 ug/min | INTRAVENOUS | Status: DC
  Filled 2017-03-25: qty 2

## 2017-03-25 MED ORDER — SODIUM CHLORIDE 0.9 % IV SOLN
20.0000 ug | INTRAVENOUS | Status: AC
  Administered 2017-03-25: 20 ug via INTRAVENOUS
  Filled 2017-03-25: qty 5

## 2017-03-25 MED ORDER — MIDAZOLAM HCL 2 MG/2ML IJ SOLN
INTRAMUSCULAR | Status: DC | PRN
  Administered 2017-03-25: 2 mg via INTRAVENOUS

## 2017-03-25 MED ORDER — SODIUM BICARBONATE 8.4 % IV SOLN
INTRAVENOUS | Status: DC | PRN
  Administered 2017-03-25 (×2): 50 meq via INTRAVENOUS

## 2017-03-25 MED ORDER — SODIUM CHLORIDE 0.9 % IV SOLN
0.0000 ug/kg/h | INTRAVENOUS | Status: DC
  Administered 2017-03-26 (×2): 1.2 ug/kg/h via INTRAVENOUS
  Filled 2017-03-25 (×4): qty 2

## 2017-03-25 MED ORDER — CHLORHEXIDINE GLUCONATE 0.12% ORAL RINSE (MEDLINE KIT)
15.0000 mL | Freq: Two times a day (BID) | OROMUCOSAL | Status: DC
  Administered 2017-03-25 – 2017-03-28 (×7): 15 mL via OROMUCOSAL

## 2017-03-25 MED ORDER — PROTAMINE SULFATE 10 MG/ML IV SOLN
INTRAVENOUS | Status: AC
Start: 2017-03-25 — End: 2017-03-25
  Filled 2017-03-25: qty 20

## 2017-03-25 MED ORDER — TRAMADOL HCL 50 MG PO TABS
50.0000 mg | ORAL_TABLET | ORAL | Status: DC | PRN
  Administered 2017-04-02 – 2017-04-10 (×12): 50 mg via ORAL
  Filled 2017-03-25 (×14): qty 1

## 2017-03-25 MED ORDER — LACTATED RINGERS IV SOLN
INTRAVENOUS | Status: DC | PRN
  Administered 2017-03-25: 10:00:00 via INTRAVENOUS

## 2017-03-25 MED ORDER — MORPHINE SULFATE (PF) 2 MG/ML IV SOLN: 1.0000 mg | INTRAVENOUS | Status: AC | PRN

## 2017-03-25 MED ORDER — MIDAZOLAM HCL 10 MG/2ML IJ SOLN
INTRAMUSCULAR | Status: AC
  Filled 2017-03-25: qty 2

## 2017-03-25 MED ORDER — NITROGLYCERIN IN D5W 200-5 MCG/ML-% IV SOLN
0.0000 ug/min | INTRAVENOUS | Status: DC
Start: 2017-03-25 — End: 2017-03-29

## 2017-03-25 MED ORDER — NOREPINEPHRINE BITARTRATE 1 MG/ML IV SOLN
0.0000 ug/min | INTRAVENOUS | Status: DC
  Filled 2017-03-25 (×2): qty 4

## 2017-03-25 MED ORDER — SUCCINYLCHOLINE CHLORIDE 200 MG/10ML IV SOSY
PREFILLED_SYRINGE | INTRAVENOUS | Status: AC
  Filled 2017-03-25: qty 10

## 2017-03-25 MED ORDER — VANCOMYCIN HCL 10 G IV SOLR
1250.0000 mg | INTRAVENOUS | Status: AC
  Administered 2017-03-25: 1250 mg via INTRAVENOUS
  Filled 2017-03-25: qty 1250

## 2017-03-25 MED ORDER — FENTANYL CITRATE (PF) 250 MCG/5ML IJ SOLN
INTRAMUSCULAR | Status: DC | PRN
  Administered 2017-03-25 (×3): 250 ug via INTRAVENOUS
  Administered 2017-03-25: 100 ug via INTRAVENOUS

## 2017-03-25 MED ORDER — SODIUM BICARBONATE 8.4 % IV SOLN
INTRAVENOUS | Status: AC
  Filled 2017-03-25: qty 50

## 2017-03-25 MED ORDER — POTASSIUM CHLORIDE 10 MEQ/50ML IV SOLN
10.0000 meq | INTRAVENOUS | Status: AC
  Administered 2017-03-25 (×2): 10 meq via INTRAVENOUS

## 2017-03-25 MED ORDER — VASOPRESSIN 20 UNIT/ML IV SOLN
0.0300 [IU]/min | INTRAVENOUS | Status: DC
  Filled 2017-03-25 (×2): qty 2

## 2017-03-25 MED ORDER — LACTATED RINGERS IV SOLN: 500.0000 mL | Freq: Once | INTRAVENOUS | Status: DC | PRN

## 2017-03-25 MED ORDER — SODIUM CHLORIDE 0.9 % IJ SOLN
OROMUCOSAL | Status: DC | PRN
  Administered 2017-03-25: 12:00:00 via TOPICAL

## 2017-03-25 MED ORDER — SODIUM CHLORIDE 0.9 % IV SOLN
INTRAVENOUS | Status: DC
  Filled 2017-03-25: qty 1

## 2017-03-25 MED ORDER — AMIODARONE HCL IN DEXTROSE 360-4.14 MG/200ML-% IV SOLN
60.0000 mg/h | INTRAVENOUS | Status: DC
  Administered 2017-03-25 (×2): 60 mg/h via INTRAVENOUS
  Filled 2017-03-25 (×2): qty 200

## 2017-03-25 MED ORDER — BISACODYL 10 MG RE SUPP: 10.0000 mg | Freq: Every day | RECTAL | Status: DC

## 2017-03-25 MED ORDER — ACETAMINOPHEN 160 MG/5ML PO SOLN: 650.0000 mg | Freq: Once | ORAL | Status: AC

## 2017-03-25 MED ORDER — TICAGRELOR 90 MG PO TABS
ORAL_TABLET | ORAL | Status: AC
  Filled 2017-03-25: qty 1

## 2017-03-25 MED ORDER — ASPIRIN EC 325 MG PO TBEC
325.0000 mg | DELAYED_RELEASE_TABLET | Freq: Every day | ORAL | Status: DC
  Administered 2017-03-28 – 2017-04-10 (×13): 325 mg via ORAL
  Filled 2017-03-25 (×13): qty 1

## 2017-03-25 MED ORDER — SODIUM CHLORIDE 0.9 % IV SOLN
INTRAVENOUS | Status: DC
  Administered 2017-03-30: 11:00:00 via INTRAVENOUS
  Administered 2017-04-05: 10 mL/h via INTRAVENOUS
  Administered 2017-04-06: 20:00:00 via INTRAVENOUS

## 2017-03-25 MED ORDER — EPINEPHRINE PF 1 MG/ML IJ SOLN
0.0000 ug/min | INTRAVENOUS | Status: DC
  Administered 2017-03-26 – 2017-03-27 (×2): 3 ug/min via INTRAVENOUS
  Filled 2017-03-25 (×5): qty 4

## 2017-03-25 MED ORDER — ROCURONIUM BROMIDE 100 MG/10ML IV SOLN
INTRAVENOUS | Status: DC | PRN
  Administered 2017-03-25 (×2): 60 mg via INTRAVENOUS
  Administered 2017-03-25: 40 mg via INTRAVENOUS

## 2017-03-25 MED ORDER — ONDANSETRON HCL 4 MG/2ML IJ SOLN
4.0000 mg | Freq: Four times a day (QID) | INTRAMUSCULAR | Status: DC | PRN
  Administered 2017-03-27: 4 mg via INTRAVENOUS
  Filled 2017-03-25: qty 2

## 2017-03-25 MED ORDER — DOCUSATE SODIUM 100 MG PO CAPS
200.0000 mg | ORAL_CAPSULE | Freq: Every day | ORAL | Status: DC
  Administered 2017-03-29 – 2017-04-03 (×3): 200 mg via ORAL
  Filled 2017-03-25 (×3): qty 2

## 2017-03-25 MED ORDER — SUCCINYLCHOLINE CHLORIDE 20 MG/ML IJ SOLN
INTRAMUSCULAR | Status: DC | PRN
  Administered 2017-03-25: 140 mg via INTRAVENOUS

## 2017-03-25 MED ORDER — TRANEXAMIC ACID (OHS) PUMP PRIME SOLUTION
2.0000 mg/kg | INTRAVENOUS | Status: DC
  Filled 2017-03-25: qty 1.63

## 2017-03-25 MED ORDER — LIDOCAINE HCL (PF) 1 % IJ SOLN
INTRAMUSCULAR | Status: DC | PRN
  Administered 2017-03-25: 10 mL
  Administered 2017-03-25: 2 mL

## 2017-03-25 MED ORDER — MILRINONE LACTATE IN DEXTROSE 20-5 MG/100ML-% IV SOLN
0.1250 ug/kg/min | INTRAVENOUS | Status: AC
  Administered 2017-03-25: 0.25 ug/kg/min via INTRAVENOUS
  Filled 2017-03-25 (×2): qty 100

## 2017-03-25 MED ORDER — BISACODYL 5 MG PO TBEC
10.0000 mg | DELAYED_RELEASE_TABLET | Freq: Every day | ORAL | Status: DC
  Administered 2017-03-28 – 2017-04-03 (×4): 10 mg via ORAL
  Filled 2017-03-25 (×4): qty 2

## 2017-03-25 MED ORDER — POTASSIUM CHLORIDE 10 MEQ/50ML IV SOLN
10.0000 meq | INTRAVENOUS | Status: AC
  Administered 2017-03-25 – 2017-03-26 (×3): 10 meq via INTRAVENOUS
  Filled 2017-03-25 (×2): qty 50

## 2017-03-25 MED ORDER — SODIUM CHLORIDE 0.9 % IV SOLN
30.0000 ug/min | INTRAVENOUS | Status: DC
  Filled 2017-03-25: qty 2

## 2017-03-25 MED ORDER — EPINEPHRINE PF 1 MG/ML IJ SOLN
0.0000 ug/min | INTRAMUSCULAR | Status: AC
  Administered 2017-03-25: 4 ug/min via INTRAVENOUS
  Filled 2017-03-25: qty 4

## 2017-03-25 MED ORDER — ASPIRIN 81 MG PO CHEW
81.0000 mg | CHEWABLE_TABLET | ORAL | Status: AC
  Administered 2017-03-25: 81 mg via ORAL

## 2017-03-25 MED ORDER — METOPROLOL TARTRATE 12.5 MG HALF TABLET: 12.5000 mg | ORAL_TABLET | Freq: Two times a day (BID) | ORAL | Status: DC

## 2017-03-25 MED ORDER — METOCLOPRAMIDE HCL 5 MG/ML IJ SOLN
10.0000 mg | Freq: Four times a day (QID) | INTRAMUSCULAR | Status: AC
  Administered 2017-03-26 – 2017-03-30 (×18): 10 mg via INTRAVENOUS
  Filled 2017-03-25 (×17): qty 2

## 2017-03-25 MED ORDER — SODIUM CHLORIDE 0.9% FLUSH
3.0000 mL | Freq: Two times a day (BID) | INTRAVENOUS | Status: DC
  Administered 2017-03-26 – 2017-04-05 (×11): 3 mL via INTRAVENOUS

## 2017-03-25 MED ORDER — NOREPINEPHRINE BITARTRATE 1 MG/ML IV SOLN
0.0000 ug/min | INTRAVENOUS | Status: DC
  Administered 2017-03-25: 10 ug/min via INTRAVENOUS
  Administered 2017-03-26: 12 ug/min via INTRAVENOUS
  Administered 2017-03-26: 4 ug/min via INTRAVENOUS
  Administered 2017-03-27: 10 ug/min via INTRAVENOUS
  Administered 2017-03-27: 5 ug/min via INTRAVENOUS
  Administered 2017-03-27: 6 ug/min via INTRAVENOUS
  Filled 2017-03-25 (×7): qty 4

## 2017-03-25 MED ORDER — ASPIRIN 81 MG PO CHEW
324.0000 mg | CHEWABLE_TABLET | Freq: Every day | ORAL | Status: DC
  Filled 2017-03-25: qty 4

## 2017-03-25 MED ORDER — PANTOPRAZOLE SODIUM 40 MG PO TBEC: 40.0000 mg | DELAYED_RELEASE_TABLET | Freq: Every day | ORAL | Status: DC

## 2017-03-25 MED ORDER — MIDAZOLAM HCL 2 MG/2ML IJ SOLN
INTRAMUSCULAR | Status: AC
  Filled 2017-03-25: qty 2

## 2017-03-25 MED ORDER — INSULIN REGULAR BOLUS VIA INFUSION
0.0000 [IU] | Freq: Three times a day (TID) | INTRAVENOUS | Status: DC
  Filled 2017-03-25: qty 10

## 2017-03-25 SURGICAL SUPPLY — 142 items
ADAPTER CARDIO PERF ANTE/RETRO (ADAPTER) ×8 IMPLANT
ADH SKN CLS APL DERMABOND .7 (GAUZE/BANDAGES/DRESSINGS) ×3
ADPR PRFSN 84XANTGRD RTRGD (ADAPTER) ×3
APL SKNCLS STERI-STRIP NONHPOA (GAUZE/BANDAGES/DRESSINGS) ×3
APL SRG 7X2 LUM MLBL SLNT (VASCULAR PRODUCTS)
APPLICATOR TIP COSEAL (VASCULAR PRODUCTS) IMPLANT
ATTRACTOMAT 16X20 MAGNETIC DRP (DRAPES) ×5 IMPLANT
BAG DECANTER FOR FLEXI CONT (MISCELLANEOUS) ×8 IMPLANT
BANDAGE ACE 4X5 VEL STRL LF (GAUZE/BANDAGES/DRESSINGS) ×5 IMPLANT
BANDAGE ACE 6X5 VEL STRL LF (GAUZE/BANDAGES/DRESSINGS) ×5 IMPLANT
BANDAGE ELASTIC 4 VELCRO ST LF (GAUZE/BANDAGES/DRESSINGS) ×2 IMPLANT
BANDAGE ELASTIC 6 VELCRO ST LF (GAUZE/BANDAGES/DRESSINGS) ×2 IMPLANT
BASKET HEART  (ORDER IN 25'S) (MISCELLANEOUS) ×1
BASKET HEART (ORDER IN 25'S) (MISCELLANEOUS) ×1
BASKET HEART (ORDER IN 25S) (MISCELLANEOUS) ×3 IMPLANT
BENZOIN TINCTURE PRP APPL 2/3 (GAUZE/BANDAGES/DRESSINGS) ×2 IMPLANT
BIOPATCH RED 1 DISK 7.0 (GAUZE/BANDAGES/DRESSINGS) ×1 IMPLANT
BIOPATCH RED 1IN DISK 7.0MM (GAUZE/BANDAGES/DRESSINGS) ×1
BLADE CLIPPER SURG (BLADE) ×2 IMPLANT
BLADE STERNUM SYSTEM 6 (BLADE) ×8 IMPLANT
BLADE SURG 12 STRL SS (BLADE) ×5 IMPLANT
BLADE SURG 15 STRL LF DISP TIS (BLADE) ×3 IMPLANT
BLADE SURG 15 STRL SS (BLADE) ×5
BNDG GAUZE ELAST 4 BULKY (GAUZE/BANDAGES/DRESSINGS) ×5 IMPLANT
CANISTER SUCT 3000ML PPV (MISCELLANEOUS) ×8 IMPLANT
CANN PRFSN 3/8XCNCT ST RT ANG (MISCELLANEOUS) ×3
CANN PRFSN 3/8XRT ANG TPR 14 (MISCELLANEOUS) ×3
CANNULA GRAFT 8MMX50CM (Graft) IMPLANT
CANNULA GUNDRY RCSP 15FR (MISCELLANEOUS) ×8 IMPLANT
CANNULA PRFSN 3/8XCNCT RT ANG (MISCELLANEOUS) IMPLANT
CANNULA PRFSN 3/8XRT ANG TPR14 (MISCELLANEOUS) IMPLANT
CANNULA SUMP PERICARDIAL (CANNULA) ×2 IMPLANT
CANNULA VEN MTL TIP RT (MISCELLANEOUS) ×10
CANNULA VRC MALB SNGL STG 28FR (MISCELLANEOUS) IMPLANT
CATH CPB KIT VANTRIGT (MISCELLANEOUS) ×5 IMPLANT
CATH ROBINSON RED A/P 18FR (CATHETERS) ×24 IMPLANT
CATH THORACIC 36FR (CATHETERS) ×5 IMPLANT
CATH THORACIC 36FR RT ANG (CATHETERS) ×5 IMPLANT
CAUTERY HIGH TEMP VAS (MISCELLANEOUS) ×5 IMPLANT
CONT SPEC 4OZ CLIKSEAL STRL BL (MISCELLANEOUS) ×5 IMPLANT
COVER SURGICAL LIGHT HANDLE (MISCELLANEOUS) ×10 IMPLANT
CRADLE DONUT ADULT HEAD (MISCELLANEOUS) ×10 IMPLANT
DERMABOND ADVANCED (GAUZE/BANDAGES/DRESSINGS) ×2
DERMABOND ADVANCED .7 DNX12 (GAUZE/BANDAGES/DRESSINGS) IMPLANT
DRAIN CHANNEL 32F RND 10.7 FF (WOUND CARE) ×5 IMPLANT
DRAPE CARDIOVASCULAR INCISE (DRAPES) ×5
DRAPE INCISE IOBAN 66X45 STRL (DRAPES) ×2 IMPLANT
DRAPE SLUSH/WARMER DISC (DRAPES) ×5 IMPLANT
DRAPE SRG 135X102X78XABS (DRAPES) ×3 IMPLANT
DRSG AQUACEL AG ADV 3.5X14 (GAUZE/BANDAGES/DRESSINGS) ×5 IMPLANT
DRSG COVADERM 4X14 (GAUZE/BANDAGES/DRESSINGS) ×5 IMPLANT
ELECT BLADE 4.0 EZ CLEAN MEGAD (MISCELLANEOUS)
ELECT BLADE 6.5 EXT (BLADE) ×5 IMPLANT
ELECT CAUTERY BLADE 6.4 (BLADE) ×8 IMPLANT
ELECT REM PT RETURN 9FT ADLT (ELECTROSURGICAL) ×10
ELECTRODE BLDE 4.0 EZ CLN MEGD (MISCELLANEOUS) ×3 IMPLANT
ELECTRODE REM PT RTRN 9FT ADLT (ELECTROSURGICAL) ×12 IMPLANT
FELT TEFLON 1X6 (MISCELLANEOUS) ×10 IMPLANT
GAUZE SPONGE 4X4 12PLY STRL (GAUZE/BANDAGES/DRESSINGS) ×13 IMPLANT
GAUZE SPONGE 4X4 12PLY STRL LF (GAUZE/BANDAGES/DRESSINGS) ×4 IMPLANT
GLOVE BIO SURGEON STRL SZ 6 (GLOVE) IMPLANT
GLOVE BIO SURGEON STRL SZ 6.5 (GLOVE) ×4 IMPLANT
GLOVE BIO SURGEON STRL SZ7 (GLOVE) ×6 IMPLANT
GLOVE BIO SURGEON STRL SZ7.5 (GLOVE) ×17 IMPLANT
GLOVE BIO SURGEONS STRL SZ 6.5 (GLOVE) ×4
GLOVE EUDERMIC 7 POWDERFREE (GLOVE) ×6 IMPLANT
GOWN STRL REUS W/ TWL LRG LVL3 (GOWN DISPOSABLE) ×24 IMPLANT
GOWN STRL REUS W/ TWL XL LVL3 (GOWN DISPOSABLE) ×3 IMPLANT
GOWN STRL REUS W/TWL LRG LVL3 (GOWN DISPOSABLE) ×40
GOWN STRL REUS W/TWL XL LVL3 (GOWN DISPOSABLE) ×5
GRAFT HEMASHIELD 10MM (Graft) ×5 IMPLANT
GRAFT VASC STRG 30X10STRL (Graft) IMPLANT
HEART VENT LT CURVED (MISCELLANEOUS) ×3 IMPLANT
HEMOSTAT POWDER SURGIFOAM 1G (HEMOSTASIS) ×24 IMPLANT
HEMOSTAT SURGICEL 2X14 (HEMOSTASIS) ×8 IMPLANT
INSERT FOGARTY XLG (MISCELLANEOUS) IMPLANT
KIT BASIN OR (CUSTOM PROCEDURE TRAY) ×8 IMPLANT
KIT CATH CPB BARTLE (MISCELLANEOUS) ×5 IMPLANT
KIT ROOM TURNOVER OR (KITS) ×8 IMPLANT
KIT SUCTION CATH 14FR (SUCTIONS) ×8 IMPLANT
KIT VASOVIEW HEMOPRO VH 3000 (KITS) ×5 IMPLANT
LEAD PACING MYOCARDI (MISCELLANEOUS) ×5 IMPLANT
LINE VENT (MISCELLANEOUS) ×2 IMPLANT
MARKER GRAFT CORONARY BYPASS (MISCELLANEOUS) ×15 IMPLANT
NS IRRIG 1000ML POUR BTL (IV SOLUTION) ×42 IMPLANT
PACK OPEN HEART (CUSTOM PROCEDURE TRAY) ×8 IMPLANT
PAD ARMBOARD 7.5X6 YLW CONV (MISCELLANEOUS) ×16 IMPLANT
PAD ELECT DEFIB RADIOL ZOLL (MISCELLANEOUS) ×5 IMPLANT
PENCIL BUTTON HOLSTER BLD 10FT (ELECTRODE) ×5 IMPLANT
POWDER SURGICEL 3.0 GRAM (HEMOSTASIS) ×2 IMPLANT
PUMP SET IMPELLA LD (SET/KITS/TRAYS/PACK) ×2 IMPLANT
PUNCH AORTIC ROTATE 4.0MM (MISCELLANEOUS) IMPLANT
PUNCH AORTIC ROTATE 4.5MM 8IN (MISCELLANEOUS) IMPLANT
PUNCH AORTIC ROTATE 5MM 8IN (MISCELLANEOUS) IMPLANT
SEALANT SURG COSEAL 8ML (VASCULAR PRODUCTS) ×2 IMPLANT
SET CARDIOPLEGIA MPS 5001102 (MISCELLANEOUS) ×2 IMPLANT
SURGIFLO W/THROMBIN 8M KIT (HEMOSTASIS) ×5 IMPLANT
SUT BONE WAX W31G (SUTURE) ×8 IMPLANT
SUT ETHIBON 2 0 V 52N 30 (SUTURE) ×6 IMPLANT
SUT ETHIBON EXCEL 2-0 V-5 (SUTURE) IMPLANT
SUT ETHIBOND V-5 VALVE (SUTURE) IMPLANT
SUT ETHILON 3 0 FSL (SUTURE) ×2 IMPLANT
SUT MNCRL AB 4-0 PS2 18 (SUTURE) IMPLANT
SUT PROLENE 3 0 SH 1 (SUTURE) ×3 IMPLANT
SUT PROLENE 3 0 SH DA (SUTURE) IMPLANT
SUT PROLENE 3 0 SH1 36 (SUTURE) IMPLANT
SUT PROLENE 4 0 RB 1 (SUTURE) ×20
SUT PROLENE 4 0 SH DA (SUTURE) ×5 IMPLANT
SUT PROLENE 4-0 RB1 .5 CRCL 36 (SUTURE) ×15 IMPLANT
SUT PROLENE 5 0 C 1 36 (SUTURE) ×8 IMPLANT
SUT PROLENE 5 0 RB 2 (SUTURE) ×6 IMPLANT
SUT PROLENE 6 0 C 1 30 (SUTURE) ×8 IMPLANT
SUT PROLENE 6 0 CC (SUTURE) ×15 IMPLANT
SUT PROLENE 8 0 BV175 6 (SUTURE) IMPLANT
SUT PROLENE BLUE 7 0 (SUTURE) ×5 IMPLANT
SUT SILK  1 MH (SUTURE) ×4
SUT SILK 1 MH (SUTURE) IMPLANT
SUT SILK 2 0 SH CR/8 (SUTURE) IMPLANT
SUT SILK 3 0 SH CR/8 (SUTURE) IMPLANT
SUT STEEL 6MS V (SUTURE) ×10 IMPLANT
SUT STEEL STERNAL CCS#1 18IN (SUTURE) IMPLANT
SUT STEEL SZ 6 DBL 3X14 BALL (SUTURE) ×5 IMPLANT
SUT VIC AB 1 CTX 36 (SUTURE) ×10
SUT VIC AB 1 CTX36XBRD ANBCTR (SUTURE) ×12 IMPLANT
SUT VIC AB 2-0 CT1 27 (SUTURE) ×5
SUT VIC AB 2-0 CT1 TAPERPNT 27 (SUTURE) IMPLANT
SUT VIC AB 2-0 CTX 27 (SUTURE) IMPLANT
SUT VIC AB 3-0 X1 27 (SUTURE) IMPLANT
SUTURE E-PAK OPEN HEART (SUTURE) ×8 IMPLANT
SYSTEM SAHARA CHEST DRAIN ATS (WOUND CARE) ×8 IMPLANT
TAPE CLOTH SURG 4X10 WHT LF (GAUZE/BANDAGES/DRESSINGS) ×4 IMPLANT
TAPE PAPER 3X10 WHT MICROPORE (GAUZE/BANDAGES/DRESSINGS) ×2 IMPLANT
TOWEL GREEN STERILE (TOWEL DISPOSABLE) ×12 IMPLANT
TOWEL GREEN STERILE FF (TOWEL DISPOSABLE) ×6 IMPLANT
TOWEL OR 17X24 6PK STRL BLUE (TOWEL DISPOSABLE) ×13 IMPLANT
TOWEL OR 17X26 10 PK STRL BLUE (TOWEL DISPOSABLE) ×13 IMPLANT
TRAY FOLEY SILVER 14FR TEMP (SET/KITS/TRAYS/PACK) ×3 IMPLANT
TRAY FOLEY SILVER 16FR TEMP (SET/KITS/TRAYS/PACK) ×5 IMPLANT
TUBING INSUFFLATION (TUBING) ×5 IMPLANT
UNDERPAD 30X30 (UNDERPADS AND DIAPERS) ×8 IMPLANT
VRC MALLEABLE SINGLE STG 28FR (MISCELLANEOUS) ×5
WATER STERILE IRR 1000ML POUR (IV SOLUTION) ×16 IMPLANT

## 2017-03-25 SURGICAL SUPPLY — 19 items
CATH LAUNCHER 6FR EBU3.5 (CATHETERS) ×1 IMPLANT
CATH OPTITORQUE TIG 4.0 5F (CATHETERS) ×1 IMPLANT
COVER PRB 48X5XTLSCP FOLD TPE (BAG) IMPLANT
COVER PROBE 5X48 (BAG) ×2
DEVICE RAD COMP TR BAND LRG (VASCULAR PRODUCTS) ×1 IMPLANT
ELECT DEFIB PAD ADLT CADENCE (PAD) ×1 IMPLANT
GLIDESHEATH SLEND A-KIT 6F 20G (SHEATH) ×1 IMPLANT
GUIDEWIRE INQWIRE 1.5J.035X260 (WIRE) IMPLANT
INQWIRE 1.5J .035X260CM (WIRE) ×2
KIT ENCORE 26 ADVANTAGE (KITS) ×1 IMPLANT
KIT HEART LEFT (KITS) ×2 IMPLANT
KIT MICROINTRODUCER STIFF 5F (SHEATH) ×1 IMPLANT
PACK CARDIAC CATHETERIZATION (CUSTOM PROCEDURE TRAY) ×2 IMPLANT
SHEATH PINNACLE 6F 10CM (SHEATH) ×1 IMPLANT
TRANSDUCER W/STOPCOCK (MISCELLANEOUS) ×2 IMPLANT
TUBING CIL FLEX 10 FLL-RA (TUBING) ×2 IMPLANT
WIRE ASAHI MIRACLEBROS12 180CM (WIRE) ×1 IMPLANT
WIRE COUGAR XT STRL 190CM (WIRE) ×1 IMPLANT
WIRE EMERALD 3MM-J .035X150CM (WIRE) ×1 IMPLANT

## 2017-03-25 NOTE — Transfer of Care (Signed)
Immediate Anesthesia Transfer of Care Note  Patient: Luis [name]  Procedure(s) Performed: Procedure(s): CORONARY ARTERY BYPASS GRAFTING (CABG) x 2 using left internal mammary artery and right greater saphenous leg vein using endosciope. (N/A) TRANSESOPHAGEAL ECHOCARDIOGRAM (TEE) (N/A) PLACEMENT OF IMPELLA LEFT VENTRICULAR ASSIST DEVICE ATRIAL SEPTAL DEFECT (ASD) REPAIR (N/A)  Patient Location: SICU  Anesthesia Type:General  Level of Consciousness: sedated, unresponsive and Patient remains intubated per anesthesia plan  Airway & Oxygen Therapy: Patient remains intubated per anesthesia plan and Patient placed on Ventilator (see vital sign flow sheet for setting)  Post-op Assessment: Report given to RN and Post -op Vital signs reviewed and stable  Post vital signs: Reviewed and stable  Last Vitals:  Vitals:   03/25/17 0942 03/25/17 1653  BP: (!) 85/34 (!) 111/59  Pulse:  89  Resp: (!) 4 17  Temp:      Last Pain:  Vitals:   03/25/17 0929  TempSrc:   PainSc: 5       Patients Stated Pain Goal: 3 (27/61/47 0929)  Complications: No apparent anesthesia complications

## 2017-03-25 NOTE — Progress Notes (Signed)
  Echocardiogram Echocardiogram Transesophageal has been performed.  Kameko Hukill T Greyson Riccardi 03/25/2017, 3:37 PM

## 2017-03-25 NOTE — Progress Notes (Signed)
  Amiodarone Drug - Drug Interaction Consult Note  Recommendations: Watch heart rate closely with Lopressor Be aware of risk of myopathy with rosuvastatin  Amiodarone is metabolized by the cytochrome P450 system and therefore has the potential to cause many drug interactions. Amiodarone has an average plasma half-life of 50 days (range 20 to 100 days).   There is potential for drug interactions to occur several weeks or months after stopping treatment and the onset of drug interactions may be slow after initiating amiodarone.   [x]  Statins: Increased risk of myopathy. Simvastatin- restrict dose to 20mg  daily. Other statins: counsel patients to report any muscle pain or weakness immediately.  []  Anticoagulants: Amiodarone can increase anticoagulant effect. Consider warfarin dose reduction. Patients should be monitored closely and the dose of anticoagulant altered accordingly, remembering that amiodarone levels take several weeks to stabilize.  []  Antiepileptics: Amiodarone can increase plasma concentration of phenytoin, the dose should be reduced. Note that small changes in phenytoin dose can result in large changes in levels. Monitor patient and counsel on signs of toxicity.  [x]  Beta blockers: increased risk of bradycardia, AV block and myocardial depression. Sotalol - avoid concomitant use.  []   Calcium channel blockers (diltiazem and verapamil): increased risk of bradycardia, AV block and myocardial depression.  []   Cyclosporine: Amiodarone increases levels of cyclosporine. Reduced dose of cyclosporine is recommended.  []  Digoxin dose should be halved when amiodarone is started.  []  Diuretics: increased risk of cardiotoxicity if hypokalemia occurs.  []  Oral hypoglycemic agents (glyburide, glipizide, glimepiride): increased risk of hypoglycemia. Patient's glucose levels should be monitored closely when initiating amiodarone therapy.   []  Drugs that prolong the QT interval:  Torsades de  pointes risk may be increased with concurrent use - avoid if possible.  Monitor QTc, also keep magnesium/potassium WNL if concurrent therapy can't be avoided. Marland Kitchen Antibiotics: e.g. fluoroquinolones, erythromycin. . Antiarrhythmics: e.g. quinidine, procainamide, disopyramide, sotalol. . Antipsychotics: e.g. phenothiazines, haloperidol.  . Lithium, tricyclic antidepressants, and methadone.   Thank You, Labrian Torregrossa D. Zabria Liss, PharmD, BCPS Clinical Pharmacist [phone] 03/25/2017 7:46 PM

## 2017-03-25 NOTE — Progress Notes (Signed)
Patient ID: Luis [name], male   DOB: 1945/10/26, 71 y.o.   MRN: [id number] EVENING ROUNDS NOTE :     [address]       [place] [address]             [phone]                 Day of Surgery Procedure(s) (LRB): CORONARY ARTERY BYPASS GRAFTING (CABG) x 2 using left internal mammary artery and right greater saphenous leg vein using endosciope. (N/A) TRANSESOPHAGEAL ECHOCARDIOGRAM (TEE) (N/A) PLACEMENT OF IMPELLA LEFT VENTRICULAR ASSIST DEVICE ATRIAL SEPTAL DEFECT (ASD) REPAIR (N/A)  Total Length of Stay:  LOS: 0 days  BP (!) 111/59   Pulse 89   Temp 98 F (36.7 C) (Oral)   Resp 17   Ht 5' 7.5" (1.715 m)   Wt 180 lb (81.6 kg)   SpO2 96%   BMI 27.78 kg/m   .Intake/Output      06/26 0701 - 06/27 0700 06/27 0701 - 06/28 0700   I.V. (mL/kg)  3300 (40.4)   Blood  1637   Other  32.6   IV Piggyback  250   Total Intake(mL/kg)  5219.6 (64)   Urine (mL/kg/hr)  750 (0.8)   Blood  3312 (3.5)   Total Output   4062   Net   +1157.6          . sodium chloride    . [START ON 03/26/2017] sodium chloride    . sodium chloride    . albumin human    . cefUROXime (ZINACEF)  IV    . dexmedetomidine (PRECEDEX) IV infusion    . dextrose 5 % Impella 5.0 Purge solution    . EPINEPHrine 4 mg in dextrose 5% 250 mL infusion (16 mcg/mL) 2 mcg/min (03/25/17 1645)  . famotidine (PEPCID) IV Stopped (03/25/17 1807)  . impella catheter heparin 50 unit/mL in dextrose 5%    . heparin    . insulin (NOVOLIN-R) infusion 8.7 Units/hr (03/25/17 1645)  . lactated ringers 20 mL/hr at 03/25/17 1645  . lactated ringers 20 mL/hr at 03/25/17 1645  . magnesium sulfate 4 g (03/25/17 1745)  . milrinone 0.25 mcg/kg/min (03/25/17 1645)  . nitroGLYCERIN Stopped (03/25/17 1645)  . norepinephrine (LEVOPHED) Adult infusion 10 mcg/min (03/25/17 1645)  . phenylephrine (NEO-SYNEPHRINE) Adult infusion Stopped (03/25/17 1645)  . potassium chloride 10 mEq (03/25/17 1815)  . vancomycin       Lab  Results  Component Value Date   WBC 10.9 (H) 03/25/2017   HGB 9.6 (L) 03/25/2017   HCT 29.0 (L) 03/25/2017   PLT 228 03/25/2017   GLUCOSE 196 (H) 03/25/2017   ALT 27 03/23/2015   AST 28 03/23/2015   NA 144 03/25/2017   K 4.1 03/25/2017   CL 102 03/25/2017   CREATININE 0.90 03/25/2017   BUN 14 03/25/2017   CO2 22 03/25/2017   INR 1.30 03/25/2017   Early post op imp ella  Placed in or  Not bleeding Plan to keep intubated tonight    Grace Isaac MD  Beeper [phone] Office [phone] 03/25/2017 6:46 PM

## 2017-03-25 NOTE — Brief Op Note (Signed)
03/25/2017  1:46 PM  PATIENT:  Luis [name]  71 y.o. male  PRE-OPERATIVE DIAGNOSIS:  1. Dissected left main 2. Coronary artery disease   POST-OPERATIVE DIAGNOSIS:  1. Dissected left main 2. Coronary artery disease 3. ASD   PROCEDURE:  TRANSESOPHAGEAL ECHOCARDIOGRAM (TEE), EMERGENT MEDIAN STERNOTOMY for CORONARY ARTERY BYPASS GRAFTING (CABG) x 2 (LIMA to LAD, SVG to OM) using left internal mammary artery and right greater saphenous leg vein using endoscopically, PRIMARY REPAIR ATRIAL SEPTAL DEFECT (ASD), and PLACEMENT OF IMPELLA LEFT VENTRICULAR ASSIST DEVICE    SURGEON:  Surgeon(s) and Role: Dr. [name], MD - Primary  PHYSICIAN ASSISTANT: Lars Pinks PA-C  ASSISTANTS: Dineen Kid RNFA  ANESTHESIA:   general  EBL:  Total I/O In: 1400 [I.V.:1400] Out: 200 [Urine:200]  BLOOD ADMINISTERED:Two FFP and one PLTS  DRAINS: Chest tubes placed in the mediastinal and pleural spaces   COUNTS CORRECT:  YES  DICTATION: .Dragon Dictation  PLAN OF CARE: Admit to inpatient   PATIENT DISPOSITION:  ICU - intubated and critically ill.   Delay start of Pharmacological VTE agent (>24hrs) due to surgical blood loss or risk of bleeding: yes  BASELINE WEIGHT: 81.6 kg

## 2017-03-25 NOTE — Progress Notes (Signed)
I have personally met with the family, his wife, son and explained to them regarding the procedure, competitions the procedure, discussed regarding hibernating myocardium, high risk stress test.  I also discussed with them regarding quick recognition of the complication and having surgical team come in immediately, by the time patient left the cardiac catheterization lab, his hemodynamics are actually started to improve and suggested that probably the dissection was self-sealing and still there was need for emergent CABG.  I thank Dr. [name] follicular he provided and quick decision making.   Luis Prows, MD 03/25/2017, 7:12 PM [facility]. [place] Pager: [phone] Office: [phone] If no answer: Cell:  [phone]

## 2017-03-25 NOTE — Anesthesia Preprocedure Evaluation (Signed)
Anesthesia Evaluation  Patient identified by MRN, date of birth, ID band  Reviewed: NPO status , reviewed documented beta blocker date and time , Unable to perform ROS - Chart review onlyPreop documentation limited or incomplete due to emergent nature of procedure.  History of Anesthesia Complications Negative for: history of anesthetic complications  Airway Mallampati: III  TM Distance: >3 FB Neck ROM: Full    Dental  (+) Teeth Intact   Pulmonary sleep apnea ,    Pulmonary exam normal        Cardiovascular + CAD  Normal cardiovascular exam     Neuro/Psych negative neurological ROS  negative psych ROS   GI/Hepatic negative GI ROS, Neg liver ROS,   Endo/Other  diabetes  Renal/GU ESRFRenal disease     Musculoskeletal   Abdominal   Peds  Hematology negative hematology ROS (+)   Anesthesia Other Findings   Reproductive/Obstetrics                             Anesthesia Physical Anesthesia Plan  ASA: IV and emergent  Anesthesia Plan: General   Post-op Pain Management:    Induction: Intravenous  PONV Risk Score and Plan: 3 and Ondansetron, Dexamethasone, Propofol, Midazolam and Treatment may vary due to age or medical condition  Airway Management Planned: Oral ETT  Additional Equipment: Arterial line, PA Cath and 3D TEE  Intra-op Plan:   Post-operative Plan: Post-operative intubation/ventilation  Informed Consent: I have reviewed the patients History and Physical, chart, labs and discussed the procedure including the risks, benefits and alternatives for the proposed anesthesia with the patient or authorized representative who has indicated his/her understanding and acceptance.     Plan Discussed with: CRNA and Anesthesiologist  Anesthesia Plan Comments:         Anesthesia Quick Evaluation

## 2017-03-25 NOTE — Interval H&P Note (Signed)
History and Physical Interval Note:  03/25/2017 8:22 AM  Luis [name]  has presented today for surgery, with the diagnosis of abnormal stress  The various methods of treatment have been discussed with the patient and family. After consideration of risks, benefits and other options for treatment, the patient has consented to  Procedure(s): Left Heart Cath and Coronary Angiography (N/A) and possible PCI as a surgical intervention .  The patient's history has been reviewed, patient examined, no change in status, stable for surgery.  I have reviewed the patient's chart and labs.  Questions were answered to the patient's satisfaction.   Ischemic Symptoms? CCS III (Marked limitation of ordinary activity) Anti-ischemic Medical Therapy? No Therapy Non-invasive Test Results? High-risk stress test findings: cardiac mortality >3%/yr Prior CABG? No Previous CABG   Patient Information:   1-2V CAD, no prox LAD  A (8)  Indication: 18; Score: 8   Patient Information:   CTO of 1 vessel, no other CAD  A (7)  Indication: 28; Score: 7   Patient Information:   1V CAD with prox LAD  A (9)  Indication: 34; Score: 9   Patient Information:   2V-CAD with prox LAD  A (9)  Indication: 40; Score: 9   Patient Information:   3V-CAD without LMCA  A (9)  Indication: 46; Score: 9   Patient Information:   3V-CAD without LMCA With Abnormal LV systolic function  A (9)  Indication: 48; Score: 9   Patient Information:   LMCA-CAD  A (9)  Indication: 49; Score: 9   Patient Information:   2V-CAD with prox LAD PCI  A (7)  Indication: 62; Score: 7   Patient Information:   2V-CAD with prox LAD CABG  A (8)  Indication: 62; Score: 8   Patient Information:   3V-CAD without LMCA With Low CAD burden(i.e., 3 focal stenoses, low SYNTAX score) PCI  A (7)  Indication: 63; Score: 7   Patient Information:   3V-CAD without LMCA With Low CAD burden(i.e., 3 focal stenoses, low SYNTAX  score) CABG  A (9)  Indication: 63; Score: 9   Patient Information:   3V-CAD without LMCA E06c - Intermediate-high CAD burden (i.e., multiple diffuse lesions, presence of CTO, or high SYNTAX score) PCI  U (4)  Indication: 64; Score: 4   Patient Information:   3V-CAD without LMCA E06c - Intermediate-high CAD burden (i.e., multiple diffuse lesions, presence of CTO, or high SYNTAX score) CABG  A (9)  Indication: 64; Score: 9   Patient Information:   LMCA-CAD With Isolated LMCA stenosis  PCI  U (6)  Indication: 65; Score: 6   Patient Information:   LMCA-CAD With Isolated LMCA stenosis  CABG  A (9)  Indication: 65; Score: 9   Patient Information:   LMCA-CAD Additional CAD, low CAD burden (i.e., 1- to 2-vessel additional involvement, low SYNTAX score) PCI  U (5)  Indication: 66; Score: 5   Patient Information:   LMCA-CAD Additional CAD, low CAD burden (i.e., 1- to 2-vessel additional involvement, low SYNTAX score) CABG  A (9)  Indication: 66; Score: 9   Patient Information:   LMCA-CAD Additional CAD, intermediate-high CAD burden (i.e., 3-vessel involvement, presence of CTO, or high SYNTAX score) PCI  I (3)  Indication: 67; Score: 3   Patient Information:   LMCA-CAD Additional CAD, intermediate-high CAD burden (i.e., 3-vessel involvement, presence of CTO, or high SYNTAX score) CABG  A (9)  Indication: 67; Score: 9  [name]

## 2017-03-25 NOTE — Anesthesia Procedure Notes (Addendum)
Central Venous Catheter Insertion Performed by: Duane Boston, anesthesiologist Start/End6/27/2018 9:56 AM, 03/25/2017 10:08 AM Patient location: Pre-op. Emergency situation Preanesthetic checklist: patient identified, IV checked, site marked, risks and benefits discussed, surgical consent, monitors and equipment checked, pre-op evaluation, timeout performed and anesthesia consent Position: Trendelenburg Lidocaine 1% used for infiltration and patient sedated Hand hygiene performed , maximum sterile barriers used  and Seldinger technique used Catheter size: 9 Fr Total catheter length 8. PA cath was placed.Sheath introducer Swan type:thermodilution PA Cath depth:50 Procedure performed using ultrasound guided technique. Ultrasound Notes:anatomy identified, needle tip was noted to be adjacent to the nerve/plexus identified, no ultrasound evidence of intravascular and/or intraneural injection and image(s) printed for medical record Attempts: 1 Following insertion, line sutured and dressing applied. Post procedure assessment: free fluid flow, blood return through all ports and no air  Patient tolerated the procedure well with no immediate complications.

## 2017-03-25 NOTE — OR Nursing (Signed)
[id number] Cath Lab called and made aware pt will coming through in app. 20 mins.

## 2017-03-25 NOTE — Progress Notes (Signed)
Sheath removed from right radial artery and TR band placed with 14cc of air.

## 2017-03-26 ENCOUNTER — Encounter (HOSPITAL_COMMUNITY): Payer: Self-pay | Admitting: Cardiothoracic Surgery

## 2017-03-26 ENCOUNTER — Inpatient Hospital Stay (HOSPITAL_COMMUNITY): Payer: Medicare Other

## 2017-03-26 DIAGNOSIS — R570 Cardiogenic shock: Secondary | ICD-10-CM

## 2017-03-26 DIAGNOSIS — I2510 Atherosclerotic heart disease of native coronary artery without angina pectoris: Principal | ICD-10-CM

## 2017-03-26 DIAGNOSIS — Z951 Presence of aortocoronary bypass graft: Secondary | ICD-10-CM

## 2017-03-26 DIAGNOSIS — R079 Chest pain, unspecified: Secondary | ICD-10-CM

## 2017-03-26 DIAGNOSIS — Z95811 Presence of heart assist device: Secondary | ICD-10-CM

## 2017-03-26 LAB — COMPREHENSIVE METABOLIC PANEL
ALT: 36 U/L (ref 17–63)
AST: 174 U/L — ABNORMAL HIGH (ref 15–41)
Albumin: 3.6 g/dL (ref 3.5–5.0)
Alkaline Phosphatase: 24 U/L — ABNORMAL LOW (ref 38–126)
Anion gap: 8 (ref 5–15)
BUN: 12 mg/dL (ref 6–20)
CO2: 21 mmol/L — ABNORMAL LOW (ref 22–32)
Calcium: 7.4 mg/dL — ABNORMAL LOW (ref 8.9–10.3)
Chloride: 108 mmol/L (ref 101–111)
Creatinine, Ser: 1.18 mg/dL (ref 0.61–1.24)
GFR calc Af Amer: >60 mL/min (ref >60)
GFR calc non Af Amer: >60 mL/min (ref >60)
Glucose, Bld: 104 mg/dL — ABNORMAL HIGH (ref 65–99)
Potassium: 3.8 mmol/L (ref 3.5–5.1)
Sodium: 137 mmol/L (ref 135–145)
Total Bilirubin: 1.7 mg/dL — ABNORMAL HIGH (ref 0.3–1.2)
Total Protein: 4.6 g/dL — ABNORMAL LOW (ref 6.5–8.1)

## 2017-03-26 LAB — CBC WITH DIFFERENTIAL/PLATELET
Basophils Absolute: 0 10*3/uL (ref 0.0–0.1)
Basophils Relative: 0 %
Eosinophils Absolute: 0 10*3/uL (ref 0.0–0.7)
Eosinophils Relative: 0 %
HCT: 22.8 % — ABNORMAL LOW (ref 39.0–52.0)
Hemoglobin: 7.5 g/dL — ABNORMAL LOW (ref 13.0–17.0)
Lymphocytes Relative: 12 %
Lymphs Abs: 0.9 10*3/uL (ref 0.7–4.0)
MCH: 29.1 pg (ref 26.0–34.0)
MCHC: 32.9 g/dL (ref 30.0–36.0)
MCV: 88.4 fL (ref 78.0–100.0)
Monocytes Absolute: 0.9 10*3/uL (ref 0.1–1.0)
Monocytes Relative: 12 %
Neutro Abs: 6 10*3/uL (ref 1.7–7.7)
Neutrophils Relative %: 76 %
Platelets: 170 10*3/uL (ref 150–400)
RBC: 2.58 MIL/uL — ABNORMAL LOW (ref 4.22–5.81)
RDW: 14.9 % (ref 11.5–15.5)
WBC: 7.9 10*3/uL (ref 4.0–10.5)

## 2017-03-26 LAB — POCT ACTIVATED CLOTTING TIME
Activated Clotting Time: 158 seconds
Activated Clotting Time: 153 seconds
Activated Clotting Time: 164 seconds
Activated Clotting Time: 169 seconds
Activated Clotting Time: 169 seconds
Activated Clotting Time: 169 seconds
Activated Clotting Time: 180 seconds
Activated Clotting Time: 169 seconds

## 2017-03-26 LAB — GLUCOSE, CAPILLARY
Glucose-Capillary: 107 mg/dL — ABNORMAL HIGH (ref 65–99)
Glucose-Capillary: 107 mg/dL — ABNORMAL HIGH (ref 65–99)
Glucose-Capillary: 107 mg/dL — ABNORMAL HIGH (ref 65–99)
Glucose-Capillary: 110 mg/dL — ABNORMAL HIGH (ref 65–99)
Glucose-Capillary: 113 mg/dL — ABNORMAL HIGH (ref 65–99)
Glucose-Capillary: 114 mg/dL — ABNORMAL HIGH (ref 65–99)
Glucose-Capillary: 118 mg/dL — ABNORMAL HIGH (ref 65–99)
Glucose-Capillary: 118 mg/dL — ABNORMAL HIGH (ref 65–99)
Glucose-Capillary: 122 mg/dL — ABNORMAL HIGH (ref 65–99)
Glucose-Capillary: 129 mg/dL — ABNORMAL HIGH (ref 65–99)
Glucose-Capillary: 130 mg/dL — ABNORMAL HIGH (ref 65–99)
Glucose-Capillary: 137 mg/dL — ABNORMAL HIGH (ref 65–99)
Glucose-Capillary: 150 mg/dL — ABNORMAL HIGH (ref 65–99)
Glucose-Capillary: 159 mg/dL — ABNORMAL HIGH (ref 65–99)
Glucose-Capillary: 169 mg/dL — ABNORMAL HIGH (ref 65–99)
Glucose-Capillary: 177 mg/dL — ABNORMAL HIGH (ref 65–99)
Glucose-Capillary: 198 mg/dL — ABNORMAL HIGH (ref 65–99)
Glucose-Capillary: 234 mg/dL — ABNORMAL HIGH (ref 65–99)

## 2017-03-26 LAB — BASIC METABOLIC PANEL
Anion gap: 9 (ref 5–15)
BUN: 10 mg/dL (ref 6–20)
CO2: 21 mmol/L — ABNORMAL LOW (ref 22–32)
Calcium: 7.4 mg/dL — ABNORMAL LOW (ref 8.9–10.3)
Chloride: 109 mmol/L (ref 101–111)
Creatinine, Ser: 1.26 mg/dL — ABNORMAL HIGH (ref 0.61–1.24)
GFR calc Af Amer: >60 mL/min (ref >60)
GFR calc non Af Amer: 56 mL/min — ABNORMAL LOW (ref >60)
Glucose, Bld: 142 mg/dL — ABNORMAL HIGH (ref 65–99)
Potassium: 3.2 mmol/L — ABNORMAL LOW (ref 3.5–5.1)
Sodium: 139 mmol/L (ref 135–145)

## 2017-03-26 LAB — CBC
HCT: 28.1 % — ABNORMAL LOW (ref 39.0–52.0)
Hemoglobin: 9.7 g/dL — ABNORMAL LOW (ref 13.0–17.0)
MCH: 29.3 pg (ref 26.0–34.0)
MCHC: 34.5 g/dL (ref 30.0–36.0)
MCV: 84.9 fL (ref 78.0–100.0)
Platelets: 156 10*3/uL (ref 150–400)
RBC: 3.31 MIL/uL — ABNORMAL LOW (ref 4.22–5.81)
RDW: 14.4 % (ref 11.5–15.5)
WBC: 7.9 10*3/uL (ref 4.0–10.5)

## 2017-03-26 LAB — POCT I-STAT 3, ART BLOOD GAS (G3+)
Acid-base deficit: 7 mmol/L — ABNORMAL HIGH (ref 0.0–2.0)
Acid-base deficit: 3 mmol/L — ABNORMAL HIGH (ref 0.0–2.0)
Acid-base deficit: 3 mmol/L — ABNORMAL HIGH (ref 0.0–2.0)
Acid-base deficit: 1 mmol/L (ref 0.0–2.0)
Bicarbonate: 17.9 mmol/L — ABNORMAL LOW (ref 20.0–28.0)
Bicarbonate: 21.4 mmol/L (ref 20.0–28.0)
Bicarbonate: 20.4 mmol/L (ref 20.0–28.0)
Bicarbonate: 21.4 mmol/L (ref 20.0–28.0)
Bicarbonate: 22.3 mmol/L (ref 20.0–28.0)
O2 Saturation: 94 %
O2 Saturation: 97 %
O2 Saturation: 97 %
O2 Saturation: 98 %
O2 Saturation: 97 %
Patient temperature: 37.2
Patient temperature: 37.7
TCO2: 19 mmol/L (ref 0–100)
TCO2: 22 mmol/L (ref 0–100)
TCO2: 21 mmol/L (ref 0–100)
TCO2: 22 mmol/L (ref 0–100)
TCO2: 23 mmol/L (ref 0–100)
pCO2 arterial: 31.9 mmHg — ABNORMAL LOW (ref 32.0–48.0)
pCO2 arterial: 32.2 mmHg (ref 32.0–48.0)
pCO2 arterial: 28 mmHg — ABNORMAL LOW (ref 32.0–48.0)
pCO2 arterial: 25.9 mmHg — ABNORMAL LOW (ref 32.0–48.0)
pCO2 arterial: 26.8 mmHg — ABNORMAL LOW (ref 32.0–48.0)
pH, Arterial: 7.358 (ref 7.350–7.450)
pH, Arterial: 7.431 (ref 7.350–7.450)
pH, Arterial: 7.47 — ABNORMAL HIGH (ref 7.350–7.450)
pH, Arterial: 7.524 — ABNORMAL HIGH (ref 7.350–7.450)
pH, Arterial: 7.531 — ABNORMAL HIGH (ref 7.350–7.450)
pO2, Arterial: 74 mmHg — ABNORMAL LOW (ref 83.0–108.0)
pO2, Arterial: 85 mmHg (ref 83.0–108.0)
pO2, Arterial: 88 mmHg (ref 83.0–108.0)
pO2, Arterial: 98 mmHg (ref 83.0–108.0)
pO2, Arterial: 85 mmHg (ref 83.0–108.0)

## 2017-03-26 LAB — PREPARE FRESH FROZEN PLASMA
Unit division: 0
Unit division: 0
Unit division: 0
Unit division: 0

## 2017-03-26 LAB — BPAM FFP
Blood Product Expiration Date: 201807022359
Blood Product Expiration Date: 201807022359
Blood Product Expiration Date: 201807022359
Blood Product Expiration Date: 201807022359
ISSUE DATE / TIME: 201806271014
ISSUE DATE / TIME: 201806271014
ISSUE DATE / TIME: 201806271018
ISSUE DATE / TIME: 201806281037
Unit Type and Rh: 6200
Unit Type and Rh: 6200
Unit Type and Rh: 6200
Unit Type and Rh: 6200

## 2017-03-26 LAB — POCT I-STAT, CHEM 8
BUN: 11 mg/dL (ref 6–20)
Calcium, Ion: 0.99 mmol/L — ABNORMAL LOW (ref 1.15–1.40)
Chloride: 100 mmol/L — ABNORMAL LOW (ref 101–111)
Creatinine, Ser: 1 mg/dL (ref 0.61–1.24)
Glucose, Bld: 137 mg/dL — ABNORMAL HIGH (ref 65–99)
HCT: 30 % — ABNORMAL LOW (ref 39.0–52.0)
Hemoglobin: 10.2 g/dL — ABNORMAL LOW (ref 13.0–17.0)
Potassium: 3.1 mmol/L — ABNORMAL LOW (ref 3.5–5.1)
Sodium: 137 mmol/L (ref 135–145)
TCO2: 22 mmol/L (ref 0–100)

## 2017-03-26 LAB — PREPARE PLATELET PHERESIS: Unit division: 0

## 2017-03-26 LAB — BPAM PLATELET PHERESIS
Blood Product Expiration Date: 201806281304
ISSUE DATE / TIME: 201806271308
Unit Type and Rh: 7300

## 2017-03-26 LAB — PREPARE RBC (CROSSMATCH)

## 2017-03-26 LAB — CREATININE, SERUM
Creatinine, Ser: 1.18 mg/dL (ref 0.61–1.24)
GFR calc Af Amer: >60 mL/min (ref >60)
GFR calc non Af Amer: >60 mL/min (ref >60)

## 2017-03-26 LAB — MAGNESIUM
Magnesium: 2.4 mg/dL (ref 1.7–2.4)
Magnesium: 2 mg/dL (ref 1.7–2.4)

## 2017-03-26 LAB — COOXEMETRY PANEL
Carboxyhemoglobin: 1.3 % (ref 0.5–1.5)
Methemoglobin: 1.9 % — ABNORMAL HIGH (ref 0.0–1.5)
O2 Saturation: 85.8 %
Total hemoglobin: 8.1 g/dL — ABNORMAL LOW (ref 12.0–16.0)

## 2017-03-26 LAB — APTT: aPTT: 190 seconds (ref 24–36)

## 2017-03-26 LAB — LACTATE DEHYDROGENASE: LDH: 473 U/L — ABNORMAL HIGH (ref 98–192)

## 2017-03-26 MED ORDER — DEXMEDETOMIDINE HCL IN NACL 400 MCG/100ML IV SOLN
0.7000 ug/kg/h | INTRAVENOUS | Status: DC
  Administered 2017-03-26 (×3): 1.2 ug/kg/h via INTRAVENOUS
  Administered 2017-03-27: 0.7 ug/kg/h via INTRAVENOUS
  Administered 2017-03-28: 0.2 ug/kg/h via INTRAVENOUS
  Filled 2017-03-26 (×6): qty 100

## 2017-03-26 MED ORDER — FUROSEMIDE 10 MG/ML IJ SOLN
4.0000 mg/h | INTRAVENOUS | Status: DC
  Administered 2017-03-26: 10 mg/h via INTRAVENOUS
  Filled 2017-03-26: qty 25

## 2017-03-26 MED ORDER — FENTANYL 2500MCG IN NS 250ML (10MCG/ML) PREMIX INFUSION
100.0000 ug/h | INTRAVENOUS | Status: DC
  Administered 2017-03-26: 100 ug/h via INTRAVENOUS
  Administered 2017-03-26: 175 ug/h via INTRAVENOUS
  Administered 2017-03-27: 100 ug/h via INTRAVENOUS
  Filled 2017-03-26 (×2): qty 250

## 2017-03-26 MED ORDER — SODIUM CHLORIDE 0.9% FLUSH
10.0000 mL | Freq: Two times a day (BID) | INTRAVENOUS | Status: DC
  Administered 2017-03-26 – 2017-03-27 (×2): 10 mL
  Administered 2017-03-28: 40 mL
  Administered 2017-03-29 – 2017-04-01 (×5): 10 mL
  Administered 2017-04-01: 20 mL
  Administered 2017-04-02 – 2017-04-08 (×8): 10 mL

## 2017-03-26 MED ORDER — POTASSIUM CHLORIDE 10 MEQ/50ML IV SOLN
10.0000 meq | INTRAVENOUS | Status: AC
  Administered 2017-03-26 (×3): 10 meq via INTRAVENOUS
  Filled 2017-03-26 (×3): qty 50

## 2017-03-26 MED ORDER — SODIUM CHLORIDE 0.9 % IV SOLN: 0.0000 ug/kg/h | INTRAVENOUS | Status: DC

## 2017-03-26 MED ORDER — DEXMEDETOMIDINE HCL IN NACL 400 MCG/100ML IV SOLN
0.0000 ug/kg/h | INTRAVENOUS | Status: DC
  Administered 2017-03-26 (×2): 1.2 ug/kg/h via INTRAVENOUS
  Filled 2017-03-26 (×3): qty 100

## 2017-03-26 MED ORDER — SODIUM CHLORIDE 0.9 % IV SOLN
Freq: Once | INTRAVENOUS | Status: AC
  Administered 2017-03-26: 04:00:00 via INTRAVENOUS

## 2017-03-26 MED ORDER — POTASSIUM CHLORIDE 10 MEQ/50ML IV SOLN
10.0000 meq | INTRAVENOUS | Status: AC
  Administered 2017-03-26 (×2): 10 meq via INTRAVENOUS
  Filled 2017-03-26: qty 50

## 2017-03-26 MED ORDER — INSULIN DETEMIR 100 UNIT/ML ~~LOC~~ SOLN
18.0000 [IU] | Freq: Two times a day (BID) | SUBCUTANEOUS | Status: DC
  Administered 2017-03-26 (×2): 18 [IU] via SUBCUTANEOUS
  Filled 2017-03-26 (×3): qty 0.18

## 2017-03-26 MED ORDER — LEVALBUTEROL HCL 1.25 MG/0.5ML IN NEBU
1.2500 mg | INHALATION_SOLUTION | Freq: Four times a day (QID) | RESPIRATORY_TRACT | Status: DC
  Administered 2017-03-26: 1.25 mg via RESPIRATORY_TRACT
  Filled 2017-03-26: qty 0.5

## 2017-03-26 MED ORDER — LEVALBUTEROL HCL 1.25 MG/0.5ML IN NEBU
1.2500 mg | INHALATION_SOLUTION | Freq: Four times a day (QID) | RESPIRATORY_TRACT | Status: DC
  Administered 2017-03-26 – 2017-03-30 (×15): 1.25 mg via RESPIRATORY_TRACT
  Filled 2017-03-26 (×16): qty 0.5

## 2017-03-26 MED ORDER — ALBUMIN HUMAN 5 % IV SOLN
250.0000 mL | INTRAVENOUS | Status: AC | PRN
  Administered 2017-03-26 – 2017-03-27 (×2): 250 mL via INTRAVENOUS
  Filled 2017-03-26 (×2): qty 250

## 2017-03-26 MED ORDER — AMIODARONE HCL IN DEXTROSE 360-4.14 MG/200ML-% IV SOLN
30.0000 mg/h | INTRAVENOUS | Status: AC
  Administered 2017-03-26 – 2017-04-01 (×14): 30 mg/h via INTRAVENOUS
  Filled 2017-03-26 (×13): qty 200

## 2017-03-26 MED ORDER — SODIUM BICARBONATE 8.4 % IV SOLN
50.0000 meq | Freq: Once | INTRAVENOUS | Status: AC
  Administered 2017-03-26: 25 meq via INTRAVENOUS

## 2017-03-26 MED ORDER — POTASSIUM CHLORIDE 10 MEQ/50ML IV SOLN
10.0000 meq | INTRAVENOUS | Status: AC | PRN
  Administered 2017-03-26 (×3): 10 meq via INTRAVENOUS
  Filled 2017-03-26 (×4): qty 50

## 2017-03-26 MED ORDER — ALBUMIN HUMAN 5 % IV SOLN
INTRAVENOUS | Status: AC
  Filled 2017-03-26: qty 250

## 2017-03-26 MED ORDER — CHLORHEXIDINE GLUCONATE CLOTH 2 % EX PADS
6.0000 | MEDICATED_PAD | Freq: Every day | CUTANEOUS | Status: DC
  Administered 2017-03-26 – 2017-04-05 (×10): 6 via TOPICAL

## 2017-03-26 MED ORDER — INSULIN ASPART 100 UNIT/ML ~~LOC~~ SOLN
0.0000 [IU] | SUBCUTANEOUS | Status: DC
  Administered 2017-03-26: 4 [IU] via SUBCUTANEOUS
  Administered 2017-03-26: 2 [IU] via SUBCUTANEOUS
  Administered 2017-03-27 (×2): 4 [IU] via SUBCUTANEOUS
  Administered 2017-03-27: 8 [IU] via SUBCUTANEOUS
  Administered 2017-03-27: 2 [IU] via SUBCUTANEOUS
  Administered 2017-03-27 (×2): 4 [IU] via SUBCUTANEOUS
  Administered 2017-03-28 – 2017-03-31 (×13): 2 [IU] via SUBCUTANEOUS

## 2017-03-26 MED ORDER — SODIUM CHLORIDE 0.9% FLUSH
10.0000 mL | INTRAVENOUS | Status: DC | PRN
  Administered 2017-03-29: 40 mL
  Administered 2017-04-10: 20 mL
  Filled 2017-03-26 (×2): qty 40

## 2017-03-26 MED FILL — Sodium Bicarbonate IV Soln 8.4%: INTRAVENOUS | Qty: 50 | Status: AC

## 2017-03-26 MED FILL — Magnesium Sulfate Inj 50%: INTRAMUSCULAR | Qty: 10 | Status: AC

## 2017-03-26 MED FILL — Mannitol IV Soln 20%: INTRAVENOUS | Qty: 500 | Status: AC

## 2017-03-26 MED FILL — Heparin Sodium (Porcine) Inj 1000 Unit/ML: INTRAMUSCULAR | Qty: 30 | Status: AC

## 2017-03-26 MED FILL — Heparin Sodium (Porcine) Inj 1000 Unit/ML: INTRAMUSCULAR | Qty: 20 | Status: AC

## 2017-03-26 MED FILL — Sodium Chloride IV Soln 0.9%: INTRAVENOUS | Qty: 2000 | Status: AC

## 2017-03-26 MED FILL — Lidocaine HCl IV Inj 20 MG/ML: INTRAVENOUS | Qty: 5 | Status: AC

## 2017-03-26 MED FILL — Calcium Chloride Inj 10%: INTRAVENOUS | Qty: 10 | Status: AC

## 2017-03-26 MED FILL — Albumin, Human Inj 5%: INTRAVENOUS | Qty: 250 | Status: AC

## 2017-03-26 MED FILL — Electrolyte-R (PH 7.4) Solution: INTRAVENOUS | Qty: 6000 | Status: AC

## 2017-03-26 MED FILL — Heparin Sodium (Porcine) 2 Unit/ML in Sodium Chloride 0.9%: INTRAMUSCULAR | Qty: 500 | Status: AC

## 2017-03-26 MED FILL — Potassium Chloride Inj 2 mEq/ML: INTRAVENOUS | Qty: 10 | Status: AC

## 2017-03-26 NOTE — Consult Note (Addendum)
Advanced Heart Failure Team Consult Note   Primary Physician: Primary Cardiologist:  Dr Nadyne Coombes   Reason for Consultation: Cardiogenic Shock  HPI:    Luis [name] is seen today for evaluation of cardiogenic shock at the request of Dr Nadyne Coombes. Patient is encephalopathic and or intubated. Therefore history has been obtained from chart review.   Luis [name] is a 71 year old with history of hyperlipidemia and prediabetes followed by Dr Nadyne Coombes in the community and had an abnormal stress test so he was set up [place]. Yesterday he LHC complicated by left main dissection, acute MI with cardiogenic shock  requiring emergent CABG. S/P CABG x2 with impella LD 5.0.   Intubated. Remains on milrinone 0.375 mcg, norepi 13 mcg, + epi 3 mcg. Continues on NO 19 PPM. Impella at P7   CVP 15 PA pressures 25/11 CO/CI 5.5/2.8    PAPi 1.0  Review of Systems: [y] = yes, [ ]  = no  Patient is encephalopathic and or intubated. Therefore history has been obtained from chart review.   General: Weight gain [ ] ; Weight loss [ ] ; Anorexia [ ] ; Fatigue [ ] ; Fever [ ] ; Chills [ ] ; Weakness [ ]   Cardiac: Chest pain/pressure [ ] ; Resting SOB [ ] ; Exertional SOB [ ] ; Orthopnea [ ] ; Pedal Edema [ ] ; Palpitations [ ] ; Syncope [ ] ; Presyncope [ ] ; Paroxysmal nocturnal dyspnea[ ]   Pulmonary: Cough [ ] ; Wheezing[ ] ; Hemoptysis[ ] ; Sputum [ ] ; Snoring [ ]   GI: Vomiting[ ] ; Dysphagia[ ] ; Melena[ ] ; Hematochezia [ ] ; Heartburn[ ] ; Abdominal pain [ ] ; Constipation [ ] ; Diarrhea [ ] ; BRBPR [ ]   GU: Hematuria[ ] ; Dysuria [ ] ; Nocturia[ ]   Vascular: Pain in legs with walking [ ] ; Pain in feet with lying flat [ ] ; Non-healing sores [ ] ; Stroke [ ] ; TIA [ ] ; Slurred speech [ ] ;  Neuro: Headaches[ ] ; Vertigo[ ] ; Seizures[ ] ; Paresthesias[ ] ;Blurred vision [ ] ; Diplopia [ ] ; Vision changes [ ]   Ortho/Skin: Arthritis [ ] ; Joint pain [ ] ; Muscle pain [ ] ; Joint swelling [ ] ; Back Pain [ ] ; Rash [ ]   Psych: Depression[ ] ; Anxiety[ ]   Heme:  Bleeding problems [ ] ; Clotting disorders [ ] ; Anemia [ ]   Endocrine: Diabetes [ ] ; Thyroid dysfunction[ ]   Home Medications Prior to Admission medications   Medication Sig Start Date End Date Taking? Authorizing Provider  clotrimazole-betamethasone (LOTRISONE) cream APPLY TO AFFECED AREA LOCALLY TWICE DAILY AS NEEDED FOR RASH 02/26/17  Yes [provider]  rosuvastatin (CRESTOR) 40 MG tablet Take 40 mg by mouth daily. 03/17/17  Yes [provider]    Past Medical History: Past Medical History:  Diagnosis Date  . Chronic kidney disease    kidney stones; noted per H&P per Dr [name] 02/06/2015 chronic kidney disease stage II  . Diabetes mellitus without complication ([place])    per H&P Dr [name] noted to be prediabetic 02/06/2015  . Dyslipidemia   . History of hepatomegaly    with fatty liver discussed per H&P per Dr [name] 02/06/2015 secondary to abd ultrasound   . Sleep apnea    AHI 17 on sleep study 08/23/2013 pt states was given CPAP machine but sent it back - does not use   . Vitamin D deficiency    as noted per H&P per Dr [name] 02/06/2015    Past Surgical History: Past Surgical History:  Procedure Laterality Date  . CYSTOSCOPY W/ RETROGRADES Left 03/26/2015   Procedure: CYSTOSCOPY WITH RETROGRADE  PYELOGRAM;  Surgeon: [name], MD;  Location: [facility];  Service: Urology;  Laterality: Left;  . LEFT HEART CATH AND CORONARY ANGIOGRAPHY N/A 03/25/2017   Procedure: Left Heart Cath and Coronary Angiography;  Surgeon: [name], MD;  Location: [facility];  Service: Cardiovascular;  Laterality: N/A;  . NO PAST SURGERIES    . ROBOT ASSISTED PYELOPLASTY Left 03/26/2015   Procedure: ROBOTIC ASSISTED PYELOPLASTY WITH STENT PLACEMENT, REMOVAL OF STONE AND CYST DECORTICATION;  Surgeon: [name], MD;  Location: [facility];  Service: Urology;  Laterality: Left;    Family History: History reviewed. No pertinent family history.  Social History: Social History    Social History  . Marital status: Married    Spouse name: N/A  . Number of children: N/A  . Years of education: N/A   Social History Main Topics  . Smoking status: Never Smoker  . Smokeless tobacco: Never Used  . Alcohol use No  . Drug use: No  . Sexual activity: Not Asked   Other Topics Concern  . None   Social History Narrative  . None    Allergies:  No Known Allergies  Objective:    Vital Signs:   Temp:  [97.2 F (36.2 C)-99.5 F (37.5 C)] 97.7 F (36.5 C) (06/28 0915) Pulse Rate:  [55-179] 89 (06/28 0915) Resp:  [0-38] 0 (06/28 0915) BP: (70-137)/(34-97) 105/72 (06/27 1700) SpO2:  [62 %-100 %] 100 % (06/28 0915) Arterial Line BP: (76-158)/(47-73) 158/70 (06/28 0915) FiO2 (%):  [40 %-100 %] 40 % (06/28 0827) Weight:  [207 lb 7.3 oz (94.1 kg)] 207 lb 7.3 oz (94.1 kg) (06/28 0500)    Weight change: Filed Weights   03/25/17 0627 03/26/17 0500  Weight: 180 lb (81.6 kg) 207 lb 7.3 oz (94.1 kg)    Intake/Output:   Intake/Output Summary (Last 24 hours) at 03/26/17 0920 Last data filed at 03/26/17 0900  Gross per 24 hour  Intake         11305.97 ml  Output             6032 ml  Net          5273.97 ml      Physical Exam   CVP 15 General:  Intubated/sedated HEENT: normal. RIJ swan Neck: supple. JVP elevated. Carotids 2+ bilat; no bruits. No lymphadenopathy or thyromegaly appreciated. Cor: PMI nondisplaced. Regular rate & rhythm. No rubs, gallops or murmurs. Sternal dressing. Impella LD covered with dressing.  Lungs: mechanical breath sounds.  Abdomen: soft, nontender, nondistended. No hepatosplenomegaly. No bruits or masses. No bowel sounds. . Extremities: no cyanosis, clubbing, rash, edema Neuro: intubated/sedated Foley: yellow urine   Telemetry   A paced 90 bpm   EKG    SR 79 bpm   Labs   Basic Metabolic Panel:  Recent Labs Lab 03/25/17 0658  03/25/17 1150 03/25/17 1330 03/25/17 1453 03/25/17 1715 03/25/17 2200 03/25/17 2204  03/26/17 0239  NA 138  < > 136 138 140 144  --  142 139  K 4.1  < > 4.3 5.4* 3.9 4.1  --  3.0* 3.2*  CL 109  < > 94* 102 102  --   --  104 109  CO2 22  --   --   --   --   --   --   --  21*  GLUCOSE 114*  < > 222* 199* 234* 196*  --  201* 142*  BUN 11  < > 12 12 14   --   --  10 10  CREATININE 1.07  < > 0.60* 0.80 0.90  --  1.30* 1.00 1.26*  CALCIUM 8.8*  --   --   --   --   --   --   --  7.4*  MG  --   --   --   --   --   --  2.7*  --  2.4  < > = values in this interval not displayed.  Liver Function Tests: No results for input(s): AST, ALT, ALKPHOS, BILITOT, PROT, ALBUMIN in the last 168 hours. No results for input(s): LIPASE, AMYLASE in the last 168 hours. No results for input(s): AMMONIA in the last 168 hours.  CBC:  Recent Labs Lab 03/25/17 0658  03/25/17 1327  03/25/17 1715 03/25/17 1722 03/25/17 2200 03/25/17 2204 03/26/17 0239  WBC 5.6  --   --   --   --  10.9* 9.1  --  7.9  NEUTROABS  --   --   --   --   --   --   --   --  6.0  HGB 14.3  < > 9.9*  < > 9.9* 9.6* 8.4* 7.8* 7.5*  HCT 43.0  < > 29.6*  < > 29.0* 29.0* 25.1* 23.0* 22.8*  MCV 89.0  --   --   --   --  89.8 89.6  --  88.4  PLT 242  --  180  --   --  228 189  --  170  < > = values in this interval not displayed.  Cardiac Enzymes: No results for input(s): CKTOTAL, CKMB, CKMBINDEX, TROPONINI in the last 168 hours.  BNP: BNP (last 3 results) No results for input(s): BNP in the last 8760 hours.  ProBNP (last 3 results) No results for input(s): PROBNP in the last 8760 hours.   CBG:  Recent Labs Lab 03/26/17 0306 03/26/17 0408 03/26/17 0456 03/26/17 0603 03/26/17 0654  GLUCAP 150* 130* 129* 107* 114*    Coagulation Studies:  Recent Labs  03/25/17 0658 03/25/17 1722  LABPROT 13.0 16.3*  INR 0.98 1.30     Imaging   Dg Chest Port 1 View  Result Date: 03/26/2017 CLINICAL DATA:  Prior CABG. EXAM: PORTABLE CHEST 1 VIEW COMPARISON:  03/25/2017. FINDINGS: Endotracheal tube, NG tube,  Swan-Ganz catheter, mediastinal drainage catheter, bilateral chest tubes in stable position. No pneumothorax. Prior CABG. Diffuse but improved bilateral pulmonary infiltrates/edema. Stable bibasilar subsegmental atelectasis and small right pleural effusion. IMPRESSION: 1. Lines and tubes including bilateral chest tubes in stable position. No pneumothorax. 2. Persistent but improved bilateral pulmonary edema. Mild bibasilar subsegmental atelectasis again noted. Small right pleural effusion again noted. Electronically Signed   By: Marcello Moores  Register   On: 03/26/2017 07:41   Dg Chest Port 1 View  Result Date: 03/25/2017 CLINICAL DATA:  CABG. EXAM: PORTABLE CHEST 1 VIEW COMPARISON:  09/31/2016 . FINDINGS: Endotracheal tube tip noted 1.9 cm above the carina. NG tube tip noted below left hemidiaphragm. Swan-Ganz catheter tip noted in the main pulmonary artery. Mediastinal drainage catheters in good anatomic position. Bilateral chest tubes in good anatomic position. Heart size normal. Diffuse bilateral pulmonary alveolar infiltrates consistent pulmonary edema noted. Low lung volumes. Small right pleural effusion cannot be excluded. No pneumothorax . IMPRESSION: 1. Prior CABG. Lines and tubes including bilateral chest tubes as above. No pneumothorax. 2. Bilateral pulmonary infiltrates most consistent with pulmonary edema. Lung volumes with basilar atelectasis. Small right pleural effusion. Electronically Signed   By: Marcello Moores  Register  On: 03/25/2017 17:30      Medications:     Current Medications: . acetaminophen  1,000 mg Oral Q6H   Or  . acetaminophen (TYLENOL) oral liquid 160 mg/5 mL  1,000 mg Per Tube Q6H  . aspirin EC  325 mg Oral Daily   Or  . aspirin  324 mg Per Tube Daily  . bisacodyl  10 mg Oral Daily   Or  . bisacodyl  10 mg Rectal Daily  . chlorhexidine gluconate (MEDLINE KIT)  15 mL Mouth Rinse BID  . docusate sodium  200 mg Oral Daily  . insulin aspart  0-24 Units Subcutaneous Q4H  .  insulin detemir  18 Units Subcutaneous BID  . levalbuterol  1.25 mg Nebulization Q6H  . mouth rinse  15 mL Mouth Rinse QID  . metoCLOPramide (REGLAN) injection  10 mg Intravenous Q6H  . metoprolol tartrate  12.5 mg Oral BID   Or  . metoprolol tartrate  12.5 mg Per Tube BID  . [START ON 03/27/2017] pantoprazole  40 mg Oral Daily  . rosuvastatin  40 mg Oral q1800  . sodium chloride flush  3 mL Intravenous Q12H     Infusions: . sodium chloride 20 mL/hr at 03/26/17 0900  . sodium chloride    . sodium chloride 20 mL/hr at 03/26/17 0900  . albumin human    . albumin human    . amiodarone 30 mg/hr (03/26/17 0905)  . cefUROXime (ZINACEF)  IV Stopped (03/26/17 0756)  . dexmedetomidine 1.2 mcg/kg/hr (03/26/17 0901)  . dextrose 5 % Impella 5.0 Purge solution    . EPINEPHrine 4 mg in dextrose 5% 250 mL infusion (16 mcg/mL) 3.013 mcg/min (03/26/17 0900)  . [COMPLETED] famotidine (PEPCID) IV Stopped (03/25/17 2152)  . fentaNYL infusion INTRAVENOUS    . furosemide (LASIX) infusion 10 mg/hr (03/26/17 0900)  . impella catheter heparin 50 unit/mL in dextrose 5%    . insulin (NOVOLIN-R) infusion 6.6 Units/hr (03/26/17 0802)  . lactated ringers 20 mL/hr at 03/26/17 0900  . lactated ringers 20 mL/hr at 03/25/17 2115  . milrinone 0.3 mcg/kg/min (03/26/17 0900)  . nitroGLYCERIN Stopped (03/25/17 1645)  . norepinephrine (LEVOPHED) Adult infusion 10 mcg/min (03/26/17 0900)       Patient Profile   Luis Wainwright is 80 year admitted after LHC complicated by Left main dissection, acute MI, cardiogenic shock requiring urgent CABG x2 with impella 5.0 LD.   Assessment/Plan   1. S/P CABG x2 with insertion Impella LD 5.0  2. Cardiogenic Shock in the setting of acute MI due to LM dissection Impella LD 5.0 . Cardiac Output 5.5. On norepi 13 mcg + milrinone 0.3, and epi 3 mcg. CVP 15. Started on lasix drip earlier today at 10 mg per hour. Will need to watch closely. Need to keep CVP 8-10 with Impella. Renal  function stable.  3. Hyperlipidemia- on statin   Length of Stay: 1  Amy Clegg, NP  03/26/2017, 9:20 AM  Advanced Heart Failure Team Pager [phone] (M-F; 7a - 4p)  Please contact [facility] for night-coverage after hours (4p -7a ) and weekends on [email or web address]  Agree.  Patient POD #1 emergent CABG and Impella LD placement in setting of LM dissection.   Remains intubated on multiple pressors and inhaled NO. FiO2 40%. Cardiac output atable with Impella support  I did bedside echo personally. Hard to see EF but appears fairly well preserved. Impella position 3.4 cm from AoV. Waveforms stable.   Exam Intubated sedated Cor RRR  Lungs  clear Ab soft quiet Ext warm mild edema. RFA with impella site looks good  He remains critically ill but stabilizing s/p emergent CABG with Impella support. Continue diuresis. Will begin to wean NO and pressors. Continue Impella support for now. Watch RV numbers. (Currently PaPI ~ 1.0)   We will follow closely.   CRITICAL CARE Performed by: Glori Bickers  Total critical care time: 45 minutes  Critical care time was exclusive of separately billable procedures and treating other patients.  Critical care was necessary to treat or prevent imminent or life-threatening deterioration.  Critical care was time spent personally by me (independent of midlevel providers or residents) on the following activities: development of treatment plan with patient and/or surrogate as well as nursing, discussions with consultants, evaluation of patient's response to treatment, examination of patient, obtaining history from patient or surrogate, ordering and performing treatments and interventions, ordering and review of laboratory studies, ordering and review of radiographic studies, pulse oximetry and re-evaluation of patient's condition.  Glori Bickers, MD  10:32 AM

## 2017-03-26 NOTE — Progress Notes (Signed)
CRITICAL VALUE ALERT  Critical Value:  PTT 190  Date & Time Notied:  03/26/17 @ 0430  Provider Notified: Dr. Servando Snare  Orders Received/Actions taken: no new orders at this time

## 2017-03-26 NOTE — Progress Notes (Signed)
MD notified of decreased MAP and CVP.  Orders received to transfuse 1 unit of packed cells.

## 2017-03-26 NOTE — Progress Notes (Signed)
1 Day Post-Op Procedure(s) (LRB): CORONARY ARTERY BYPASS GRAFTING (CABG) x 2 using left internal mammary artery and right greater saphenous leg vein using endosciope. (N/A) TRANSESOPHAGEAL ECHOCARDIOGRAM (TEE) (N/A) PLACEMENT OF IMPELLA LEFT VENTRICULAR ASSIST DEVICE ATRIAL SEPTAL DEFECT (ASD) REPAIR (N/A) Subjective: Acute anterior MI with cardiogenic shock following attempted PCI of chronically occluded LAD with dissection of left main. Patient developed hypotension requiring norepinephrine and metabolic acidosis in the cath lab and was intubated. Preop ejection fraction was 40-40 percent from an old anterior MI  Emergency CABG. Patient had low cardiac output, PAD 35 mmHg after being resuscitated in the operating room. He underwent CABG 2-left IMA to LAD and saphenous vein graft to circumflex flex marginal-RCA had no significant disease. The aorta was carefully inspected on the outside and inside and there is no evidence of aortic dissection. Improved global LV function on Impella LD. Patient had a large PFO which was closed at the time of surgery.  Overnight the patient required 1 unit of blood and had evidence of ventricular arrhythmias possibly related to catheter suction events. This has improved on IV amiodarone.  Chest x-ray is wet-patient needs diuresis prior to ventilator wean Patient has moderate RV dysfunction and is currently on inhaled nitric oxide, low-dose epinephrine and medium dose milrinone. Patient is in a sinus rhythm atrially paced.  Patient is moving all extremities but remains sedated on ventilator     Objective: Vital signs in last 24 hours: Temp:  [97.2 F (36.2 C)-99.5 F (37.5 C)] 97.9 F (36.6 C) (06/28 0815) Pulse Rate:  [55-179] 89 (06/28 0815) Cardiac Rhythm: Atrial paced (06/28 0752) Resp:  [0-38] 0 (06/28 0815) BP: (70-137)/(34-97) 105/72 (06/27 1700) SpO2:  [62 %-100 %] 97 % (06/28 0827) Arterial Line BP: (76-151)/(47-73) 151/69 (06/28 0815) FiO2  (%):  [40 %-100 %] 40 % (06/28 0827) Weight:  [207 lb 7.3 oz (94.1 kg)] 207 lb 7.3 oz (94.1 kg) (06/28 0500)  Hemodynamic parameters for last 24 hours: PAP: (26-54)/(12-26) 36/20 CVP:  [12 mmHg-21 mmHg] 15 mmHg CO:  [4.8 L/min-5.8 L/min] 5.8 L/min CI:  [2.5 L/min/m2-3 L/min/m2] 3 L/min/m2  Intake/Output from previous day: 06/27 0701 - 06/28 0700 In: 10386.5 [I.V.:6221.8; PQZRA:0762; NG/GT:60; IV Piggyback:1900] Out: 2633 [Urine:1480; Blood:3312; Chest Tube:950] Intake/Output this shift: Total I/O In: 387.3 [I.V.:297.3; Other:40; IV Piggyback:50] Out: 100 [Chest Tube:100]       Exam    General-  Comfortable on ventilator   Lungs- clear without rales, wheezes   Cor- regular rate and rhythm, no murmur , gallop   Abdomen- soft, non-tender   Extremities - warm, non-tender, minimal edema   Neuro- oriented, appropriate, no focal weakness   Lab Results:  Recent Labs  03/25/17 2200 03/25/17 2204 03/26/17 0239  WBC 9.1  --  7.9  HGB 8.4* 7.8* 7.5*  HCT 25.1* 23.0* 22.8*  PLT 189  --  170   BMET:  Recent Labs  03/25/17 0658  03/25/17 2204 03/26/17 0239  NA 138  < > 142 139  K 4.1  < > 3.0* 3.2*  CL 109  < > 104 109  CO2 22  --   --  21*  GLUCOSE 114*  < > 201* 142*  BUN 11  < > 10 10  CREATININE 1.07  < > 1.00 1.26*  CALCIUM 8.8*  --   --  7.4*  < > = values in this interval not displayed.  PT/INR:  Recent Labs  03/25/17 1722  LABPROT 16.3*  INR 1.30  ABG    Component Value Date/Time   PHART 7.431 03/26/2017 0802   HCO3 21.4 03/26/2017 0802   TCO2 22 03/26/2017 0802   ACIDBASEDEF 3.0 (H) 03/26/2017 0802   O2SAT 85.8 03/26/2017 0830   CBG (last 3)   Recent Labs  03/26/17 0456 03/26/17 0603 03/26/17 0654  GLUCAP 129* 107* 114*    Assessment/Plan: S/P Procedure(s) (LRB): CORONARY ARTERY BYPASS GRAFTING (CABG) x 2 using left internal mammary artery and right greater saphenous leg vein using endosciope. (N/A) TRANSESOPHAGEAL ECHOCARDIOGRAM (TEE)  (N/A) PLACEMENT OF IMPELLA LEFT VENTRICULAR ASSIST DEVICE ATRIAL SEPTAL DEFECT (ASD) REPAIR (N/A) Diuresis Continue ventilator today until fluid removal   LOS: 1 day    Luis [name] 03/26/2017

## 2017-03-26 NOTE — Care Management Note (Signed)
Case Management Note Marvetta Gibbons RN, BSN Unit 2W-Case Manager-- [place] coverage [phone]  Patient Details  Name: Luis [name] MRN: [id number] Date of Birth: 03/25/1946  Subjective/Objective:   Pt admitted with Acute anterior MI with cardiogenic shock following attempted PCI of chronically occluded LAD with dissection of left main. Patient developed hypotension requiring norepinephrine and metabolic acidosis in the cath lab and was intubated-- pt had emergent CABGx2 on 03/25/17                 Action/Plan: PTA pt lived lived at home with wife- CM to follow for d/c needs  Expected Discharge Date:                  Expected Discharge Plan:     In-House Referral:     Discharge planning Services  CM Consult  Post Acute Care Choice:    Choice offered to:     DME Arranged:    DME Agency:     HH Arranged:    [place] Agency:     Status of Service:  In process, will continue to follow  If discussed at Long Length of Stay Meetings, dates discussed:    Discharge Disposition:   Additional Comments:  Dawayne Patricia, RN 03/26/2017, 10:18 AM

## 2017-03-26 NOTE — Progress Notes (Addendum)
[facility] BRIEF SICU PROGRESS NOTE  1 Day Post-Op  S/P Procedure(s) (LRB): CORONARY ARTERY BYPASS GRAFTING (CABG) x 2 using left internal mammary artery and right greater saphenous leg vein using endosciope. (N/A) TRANSESOPHAGEAL ECHOCARDIOGRAM (TEE) (N/A) PLACEMENT OF IMPELLA LEFT VENTRICULAR ASSIST DEVICE ATRIAL SEPTAL DEFECT (ASD) REPAIR (N/A)   Essentially stable day Sedated on vent, reportedly agitated when sedation decreased NSR w/ stable hemodynamics and good Impella flows, on Epi, Levophed and milrinone drips, although BP trending down last hour O2 sats 100% on 40% FiO2 UOP 350-400 mL/hr on lasix drip Labs okay except potassium 3.1 - being replaced  Plan: Continue current plan but will decrease lasix drip due to falling BP and PA pressures  Rexene Alberts, MD 03/26/2017 5:44 PM

## 2017-03-26 NOTE — Progress Notes (Signed)
Right femoral artery sheath removed and pressure held for 20 minutes. Groin level 0, right pedal pulse palpable, no apparent complications. Downtime starts at 9:40.

## 2017-03-26 NOTE — Progress Notes (Signed)
[place] for heparin (not currently receiving systemic heparin) Indication: Impella VD  No Known Allergies  Patient Measurements: Height: 5' 7.5" (171.5 cm) Weight: 207 lb 7.3 oz (94.1 kg) IBW/kg (Calculated) : 67.25 Heparin Dosing Weight: 81.6 kg  Vital Signs: Temp: 97.7 F (36.5 C) (06/28 1245) Temp Source: Core (Comment) (06/28 0834) BP: 100/71 (06/28 1200) Pulse Rate: 89 (06/28 1245)  Labs:  Recent Labs  03/25/17 0658  03/25/17 1722 03/25/17 2200 03/25/17 2204 03/26/17 0239 03/26/17 0830  HGB 14.3  < > 9.6* 8.4* 7.8* 7.5*  --   HCT 43.0  < > 29.0* 25.1* 23.0* 22.8*  --   PLT 242  < > 228 189  --  170  --   APTT  --   --  42*  --   --  190*  --   LABPROT 13.0  --  16.3*  --   --   --   --   INR 0.98  --  1.30  --   --   --   --   CREATININE 1.07  < >  --  1.30* 1.00 1.26* 1.18  < > = values in this interval not displayed.  Estimated Creatinine Clearance: 64.3 mL/min (by C-G formula based on SCr of 1.18 mg/dL).   Medical History: Past Medical History:  Diagnosis Date  . Chronic kidney disease    kidney stones; noted per H&P per Dr [name] 02/06/2015 chronic kidney disease stage II  . Diabetes mellitus without complication ([place])    per H&P Dr [name] noted to be prediabetic 02/06/2015  . Dyslipidemia   . History of hepatomegaly    with fatty liver discussed per H&P per Dr [name] 02/06/2015 secondary to abd ultrasound   . Sleep apnea    AHI 17 on sleep study 08/23/2013 pt states was given CPAP machine but sent it back - does not use   . Vitamin D deficiency    as noted per H&P per Dr [name] 02/06/2015    Medications:  Scheduled:  . acetaminophen  1,000 mg Oral Q6H   Or  . acetaminophen (TYLENOL) oral liquid 160 mg/5 mL  1,000 mg Per Tube Q6H  . aspirin EC  325 mg Oral Daily   Or  . aspirin  324 mg Per Tube Daily  . bisacodyl  10 mg Oral Daily   Or  . bisacodyl  10 mg Rectal Daily  . chlorhexidine gluconate  (MEDLINE KIT)  15 mL Mouth Rinse BID  . Chlorhexidine Gluconate Cloth  6 each Topical Daily  . docusate sodium  200 mg Oral Daily  . insulin aspart  0-24 Units Subcutaneous Q4H  . insulin detemir  18 Units Subcutaneous BID  . levalbuterol  1.25 mg Nebulization Q6H  . mouth rinse  15 mL Mouth Rinse QID  . metoCLOPramide (REGLAN) injection  10 mg Intravenous Q6H  . metoprolol tartrate  12.5 mg Oral BID   Or  . metoprolol tartrate  12.5 mg Per Tube BID  . [START ON 03/27/2017] pantoprazole  40 mg Oral Daily  . rosuvastatin  40 mg Oral q1800  . sodium chloride flush  10-40 mL Intracatheter Q12H  . sodium chloride flush  3 mL Intravenous Q12H    Assessment: 71 yo male s/p CABG x 2 with Impella VD placed yesterday.  Currently has heparin only in purge solution, running at 16 ml/hr = 800 units/hr.  ACTs at goal without the need for systemic heparin for now.  Goal of Therapy:  ACTs 160-180 (although lower ACTs may be tolerated per discussion with Impella rep) Monitor platelets by anticoagulation protocol: Yes   Plan:  1. Pharmacy will continue to monitor peripherally. 2. No systemic heparin for now.  Uvaldo Rising, BCPS  Clinical Pharmacist Pager [phone]  03/26/2017 12:59 PM

## 2017-03-26 NOTE — Anesthesia Postprocedure Evaluation (Signed)
Anesthesia Post Note  Patient: Xxxhiralal Guia  Procedure(s) Performed: Procedure(s) (LRB): CORONARY ARTERY BYPASS GRAFTING (CABG) x 2 using left internal mammary artery and right greater saphenous leg vein using endosciope. (N/A) TRANSESOPHAGEAL ECHOCARDIOGRAM (TEE) (N/A) PLACEMENT OF IMPELLA LEFT VENTRICULAR ASSIST DEVICE ATRIAL SEPTAL DEFECT (ASD) REPAIR (N/A)     Patient location during evaluation: SICU Anesthesia Type: General Level of consciousness: sedated Pain management: pain level controlled Vital Signs Assessment: post-procedure vital signs reviewed and stable Respiratory status: patient remains intubated per anesthesia plan Cardiovascular status: stable Anesthetic complications: no             Devonn Giampietro DANIEL

## 2017-03-27 ENCOUNTER — Inpatient Hospital Stay (HOSPITAL_COMMUNITY): Payer: Medicare Other

## 2017-03-27 DIAGNOSIS — J9601 Acute respiratory failure with hypoxia: Secondary | ICD-10-CM

## 2017-03-27 LAB — CBC
HCT: 23.5 % — ABNORMAL LOW (ref 39.0–52.0)
Hemoglobin: 8.3 g/dL — ABNORMAL LOW (ref 13.0–17.0)
MCH: 30.3 pg (ref 26.0–34.0)
MCHC: 35.3 g/dL (ref 30.0–36.0)
MCV: 85.8 fL (ref 78.0–100.0)
Platelets: 126 10*3/uL — ABNORMAL LOW (ref 150–400)
RBC: 2.74 MIL/uL — ABNORMAL LOW (ref 4.22–5.81)
RDW: 15.3 % (ref 11.5–15.5)
WBC: 8.3 10*3/uL (ref 4.0–10.5)

## 2017-03-27 LAB — POCT I-STAT 3, ART BLOOD GAS (G3+)
Acid-Base Excess: 1 mmol/L (ref 0.0–2.0)
Acid-Base Excess: 1 mmol/L (ref 0.0–2.0)
Acid-Base Excess: 1 mmol/L (ref 0.0–2.0)
Acid-Base Excess: 3 mmol/L — ABNORMAL HIGH (ref 0.0–2.0)
Acid-Base Excess: 3 mmol/L — ABNORMAL HIGH (ref 0.0–2.0)
Acid-base deficit: 1 mmol/L (ref 0.0–2.0)
Bicarbonate: 23.4 mmol/L (ref 20.0–28.0)
Bicarbonate: 24.3 mmol/L (ref 20.0–28.0)
Bicarbonate: 24.3 mmol/L (ref 20.0–28.0)
Bicarbonate: 24.5 mmol/L (ref 20.0–28.0)
Bicarbonate: 25.7 mmol/L (ref 20.0–28.0)
Bicarbonate: 25.7 mmol/L (ref 20.0–28.0)
O2 Saturation: 93 %
O2 Saturation: 97 %
O2 Saturation: 97 %
O2 Saturation: 98 %
O2 Saturation: 98 %
O2 Saturation: 99 %
Patient temperature: 37.8
Patient temperature: 37.8
Patient temperature: 38
TCO2: 25 mmol/L (ref 0–100)
TCO2: 25 mmol/L (ref 0–100)
TCO2: 25 mmol/L (ref 0–100)
TCO2: 25 mmol/L (ref 0–100)
TCO2: 27 mmol/L (ref 0–100)
TCO2: 27 mmol/L (ref 0–100)
pCO2 arterial: 31.9 mmHg — ABNORMAL LOW (ref 32.0–48.0)
pCO2 arterial: 32.5 mmHg (ref 32.0–48.0)
pCO2 arterial: 33.3 mmHg (ref 32.0–48.0)
pCO2 arterial: 33.7 mmHg (ref 32.0–48.0)
pCO2 arterial: 34.5 mmHg (ref 32.0–48.0)
pCO2 arterial: 39.7 mmHg (ref 32.0–48.0)
pH, Arterial: 7.382 (ref 7.350–7.450)
pH, Arterial: 7.458 — ABNORMAL HIGH (ref 7.350–7.450)
pH, Arterial: 7.472 — ABNORMAL HIGH (ref 7.350–7.450)
pH, Arterial: 7.493 — ABNORMAL HIGH (ref 7.350–7.450)
pH, Arterial: 7.493 — ABNORMAL HIGH (ref 7.350–7.450)
pH, Arterial: 7.506 — ABNORMAL HIGH (ref 7.350–7.450)
pO2, Arterial: 115 mmHg — ABNORMAL HIGH (ref 83.0–108.0)
pO2, Arterial: 63 mmHg — ABNORMAL LOW (ref 83.0–108.0)
pO2, Arterial: 88 mmHg (ref 83.0–108.0)
pO2, Arterial: 91 mmHg (ref 83.0–108.0)
pO2, Arterial: 92 mmHg (ref 83.0–108.0)
pO2, Arterial: 97 mmHg (ref 83.0–108.0)

## 2017-03-27 LAB — COMPREHENSIVE METABOLIC PANEL
ALT: 32 U/L (ref 17–63)
AST: 97 U/L — ABNORMAL HIGH (ref 15–41)
Albumin: 3.2 g/dL — ABNORMAL LOW (ref 3.5–5.0)
Alkaline Phosphatase: 26 U/L — ABNORMAL LOW (ref 38–126)
Anion gap: 8 (ref 5–15)
BUN: 10 mg/dL (ref 6–20)
CO2: 21 mmol/L — ABNORMAL LOW (ref 22–32)
Calcium: 6.8 mg/dL — ABNORMAL LOW (ref 8.9–10.3)
Chloride: 102 mmol/L (ref 101–111)
Creatinine, Ser: 1.33 mg/dL — ABNORMAL HIGH (ref 0.61–1.24)
GFR calc Af Amer: >60 mL/min (ref >60)
GFR calc non Af Amer: 53 mL/min — ABNORMAL LOW (ref >60)
Glucose, Bld: 196 mg/dL — ABNORMAL HIGH (ref 65–99)
Potassium: 3.9 mmol/L (ref 3.5–5.1)
Sodium: 131 mmol/L — ABNORMAL LOW (ref 135–145)
Total Bilirubin: 1.6 mg/dL — ABNORMAL HIGH (ref 0.3–1.2)
Total Protein: 4.5 g/dL — ABNORMAL LOW (ref 6.5–8.1)

## 2017-03-27 LAB — MAGNESIUM: Magnesium: 1.8 mg/dL (ref 1.7–2.4)

## 2017-03-27 LAB — POCT I-STAT, CHEM 8
BUN: 12 mg/dL (ref 6–20)
Calcium, Ion: 1 mmol/L — ABNORMAL LOW (ref 1.15–1.40)
Chloride: 96 mmol/L — ABNORMAL LOW (ref 101–111)
Creatinine, Ser: 1.3 mg/dL — ABNORMAL HIGH (ref 0.61–1.24)
Glucose, Bld: 152 mg/dL — ABNORMAL HIGH (ref 65–99)
HCT: 28 % — ABNORMAL LOW (ref 39.0–52.0)
Hemoglobin: 9.5 g/dL — ABNORMAL LOW (ref 13.0–17.0)
Potassium: 3.7 mmol/L (ref 3.5–5.1)
Sodium: 132 mmol/L — ABNORMAL LOW (ref 135–145)
TCO2: 24 mmol/L (ref 0–100)

## 2017-03-27 LAB — APTT: aPTT: >200 seconds (ref 24–36)

## 2017-03-27 LAB — POCT ACTIVATED CLOTTING TIME
Activated Clotting Time: 180 seconds
Activated Clotting Time: 175 seconds
Activated Clotting Time: 175 seconds
Activated Clotting Time: 169 seconds
Activated Clotting Time: 158 seconds
Activated Clotting Time: 158 seconds
Activated Clotting Time: 158 seconds
Activated Clotting Time: 164 seconds
Activated Clotting Time: 164 seconds
Activated Clotting Time: 180 seconds
Activated Clotting Time: 169 seconds

## 2017-03-27 LAB — COOXEMETRY PANEL
Carboxyhemoglobin: 0.7 % (ref 0.5–1.5)
Methemoglobin: 1.3 % (ref 0.0–1.5)
O2 Saturation: 56.8 %
Total hemoglobin: 8 g/dL — ABNORMAL LOW (ref 12.0–16.0)

## 2017-03-27 LAB — HEMOGLOBIN A1C
Hgb A1c MFr Bld: 6.1 % — ABNORMAL HIGH (ref 4.8–5.6)
Mean Plasma Glucose: 128 mg/dL

## 2017-03-27 LAB — ECHOCARDIOGRAM COMPLETE
Height: 67.5 in
Weight: 3312.19 oz

## 2017-03-27 LAB — GLUCOSE, CAPILLARY
Glucose-Capillary: 165 mg/dL — ABNORMAL HIGH (ref 65–99)
Glucose-Capillary: 169 mg/dL — ABNORMAL HIGH (ref 65–99)
Glucose-Capillary: 170 mg/dL — ABNORMAL HIGH (ref 65–99)
Glucose-Capillary: 179 mg/dL — ABNORMAL HIGH (ref 65–99)
Glucose-Capillary: 213 mg/dL — ABNORMAL HIGH (ref 65–99)

## 2017-03-27 LAB — CALCIUM, IONIZED: Calcium, Ionized, Serum: 4.1 mg/dL — ABNORMAL LOW (ref 4.5–5.6)

## 2017-03-27 LAB — PREPARE RBC (CROSSMATCH)

## 2017-03-27 LAB — LACTATE DEHYDROGENASE: LDH: 485 U/L — ABNORMAL HIGH (ref 98–192)

## 2017-03-27 MED ORDER — DEXTROSE 5 % IV SOLN
1.5000 g | Freq: Two times a day (BID) | INTRAVENOUS | Status: AC
  Administered 2017-03-27 – 2017-03-29 (×6): 1.5 g via INTRAVENOUS
  Filled 2017-03-27 (×6): qty 1.5

## 2017-03-27 MED ORDER — POTASSIUM CHLORIDE 10 MEQ/50ML IV SOLN
10.0000 meq | INTRAVENOUS | Status: AC | PRN
  Administered 2017-03-27 (×3): 10 meq via INTRAVENOUS
  Filled 2017-03-27 (×3): qty 50

## 2017-03-27 MED ORDER — SODIUM CHLORIDE 0.9 % IV SOLN
Freq: Once | INTRAVENOUS | Status: AC
  Administered 2017-03-27: 04:00:00 via INTRAVENOUS

## 2017-03-27 MED ORDER — PERFLUTREN LIPID MICROSPHERE
1.0000 mL | INTRAVENOUS | Status: AC | PRN
  Administered 2017-03-27: 2 mL via INTRAVENOUS
  Filled 2017-03-27: qty 10

## 2017-03-27 MED ORDER — INSULIN DETEMIR 100 UNIT/ML ~~LOC~~ SOLN
24.0000 [IU] | Freq: Two times a day (BID) | SUBCUTANEOUS | Status: DC
  Administered 2017-03-27 – 2017-03-30 (×4): 24 [IU] via SUBCUTANEOUS
  Filled 2017-03-27 (×9): qty 0.24

## 2017-03-27 MED ORDER — DEXTROSE 10 % IV SOLN
INTRAVENOUS | Status: AC
  Administered 2017-03-27 (×2): via INTRAVENOUS

## 2017-03-27 MED ORDER — FUROSEMIDE 10 MG/ML IJ SOLN
40.0000 mg | Freq: Once | INTRAMUSCULAR | Status: AC
  Administered 2017-03-27: 40 mg via INTRAVENOUS

## 2017-03-27 MED ORDER — FENTANYL CITRATE (PF) 100 MCG/2ML IJ SOLN
25.0000 ug | INTRAMUSCULAR | Status: DC | PRN
  Administered 2017-03-27: 25 ug via INTRAVENOUS
  Administered 2017-03-27 – 2017-03-29 (×22): 50 ug via INTRAVENOUS
  Administered 2017-03-30: 25 ug via INTRAVENOUS
  Administered 2017-03-31 (×2): 50 ug via INTRAVENOUS
  Filled 2017-03-27 (×25): qty 2

## 2017-03-27 MED ORDER — PANTOPRAZOLE SODIUM 40 MG IV SOLR
40.0000 mg | INTRAVENOUS | Status: DC
  Administered 2017-03-27 – 2017-04-01 (×5): 40 mg via INTRAVENOUS
  Filled 2017-03-27 (×5): qty 40

## 2017-03-27 MED ORDER — FUROSEMIDE 10 MG/ML IJ SOLN
40.0000 mg | Freq: Two times a day (BID) | INTRAMUSCULAR | Status: DC
  Administered 2017-03-27 – 2017-03-30 (×8): 40 mg via INTRAVENOUS
  Filled 2017-03-27 (×9): qty 4

## 2017-03-27 MED ORDER — HEPARIN SODIUM (PORCINE) 5000 UNIT/ML IJ SOLN
25000.0000 [IU] | INTRAMUSCULAR | Status: DC
  Administered 2017-03-27 – 2017-03-30 (×4): 25000 [IU]
  Filled 2017-03-27 (×6): qty 5

## 2017-03-27 MED ORDER — CALCIUM CHLORIDE 10 % IV SOLN
1.0000 g | Freq: Once | INTRAVENOUS | Status: AC
  Administered 2017-03-27: 1 g via INTRAVENOUS

## 2017-03-27 MED ORDER — ALBUMIN HUMAN 5 % IV SOLN
12.5000 g | Freq: Once | INTRAVENOUS | Status: AC
  Administered 2017-03-27: 12.5 g via INTRAVENOUS

## 2017-03-27 MED ORDER — DEXTROSE 5 % SOLN FOR IMPELLA PURGE CATHETER
INTRAVENOUS | Status: DC
  Administered 2017-03-27: 1000 mL
  Filled 2017-03-27: qty 1000

## 2017-03-27 MED ORDER — ALBUMIN HUMAN 5 % IV SOLN: 250.0000 mL | INTRAVENOUS | Status: AC | PRN

## 2017-03-27 NOTE — Procedures (Signed)
Extubation Procedure Note  Patient Details:   Name: Luis [name] DOB: [birth year] MRN: [id number]   Airway Documentation:     Evaluation  O2 sats: stable throughout Complications: No apparent complications Patient did tolerate procedure well. Bilateral Breath Sounds: Clear   Yes  Pt extubated to 4lpm & 4ppm Nitric per MD.   Cordella Register 03/27/2017, 2:33 PM

## 2017-03-27 NOTE — Progress Notes (Signed)
Patient Luis [name] was weaned down to 4 ppm. CVP initial was 9 and is now at 14. Per order of CVP elevation will hold on weaning NO ppm. RT and RN will continue to monitor CVP and ABG's and wean as able.

## 2017-03-27 NOTE — Plan of Care (Signed)
Spoke with family about too many visitors running in and out, placing cell phones in pt's face, waking him up after pain medication given. Patient needs rest and is critically ill. Told they can at times sit by bedside but quietly. Also only immediate family should visit  (pt has probably had 65 different visitors today.)

## 2017-03-27 NOTE — Progress Notes (Addendum)
Advanced Heart Failure Rounding Note  PCP:  Primary Cardiologist: Dr Nadyne Coombes  Subjective:    S/P CABG x2. 03/25/17  Remains intubated. Over night  Maps down. Impella at P8. Continued on norepi 7 mcg, epi 3, and milrinone 0.3 mcg.  Received 1UPRBCs, albumin and NO increased to 5 ppm.   CVP 16 PAP 32/18  CI 2.6  CO 5.1    Objective:   Weight Range: 207 lb 0.2 oz (93.9 kg) Body mass index is 31.94 kg/m.   Vital Signs:   Temp:  [97.5 F (36.4 C)-100.6 F (38.1 C)] 100 F (37.8 C) (06/29 0800) Pulse Rate:  [88-93] 89 (06/29 0800) Resp:  [0-22] 14 (06/29 0800) BP: (74-134)/(47-78) 103/66 (06/29 0800) SpO2:  [97 %-100 %] 100 % (06/29 0800) Arterial Line BP: (68-160)/(42-74) 126/65 (06/29 0800) FiO2 (%):  [40 %] 40 % (06/29 0755) Weight:  [207 lb 0.2 oz (93.9 kg)] 207 lb 0.2 oz (93.9 kg) (06/29 0500) Last BM Date:  (PTA)  Weight change: Filed Weights   03/25/17 0627 03/26/17 0500 03/27/17 0500  Weight: 180 lb (81.6 kg) 207 lb 7.3 oz (94.1 kg) 207 lb 0.2 oz (93.9 kg)    Intake/Output:   Intake/Output Summary (Last 24 hours) at 03/27/17 0808 Last data filed at 03/27/17 0800  Gross per 24 hour  Intake          5094.07 ml  Output             5590 ml  Net          -495.93 ml      Physical Exam   CVp 15-16 General:  Intubated sedated HEENT: Intubated ETT Neck: Supple. JVP hard to assess with lines.  . Carotids 2+ bilat; no bruits. No lymphadenopathy or thyromegaly appreciated. Cor: PMI nondisplaced. Regular rate & rhythm. No rubs, gallops or murmurs. Sternal dressing covering impella insertion Lungs: Decreased in the bases Abdomen: Soft, nontender, nondistended. No hepatosplenomegaly. No bruits or masses. Good bowel sounds. Extremities: No cyanosis, clubbing, rash, edema. R and L pedal pulse 2+  Neuro: intubated sedated.    Telemetry   A sensed 90 bpm  EKG    N/A  Labs    CBC  Recent Labs  03/26/17 0239 03/26/17 1522 03/26/17 1540 03/27/17 0500    WBC 7.9 7.9  --  8.3  NEUTROABS 6.0  --   --   --   HGB 7.5* 9.7* 10.2* 8.3*  HCT 22.8* 28.1* 30.0* 23.5*  MCV 88.4 84.9  --  85.8  PLT 170 156  --  161*   Basic Metabolic Panel  Recent Labs  03/26/17 0830 03/26/17 1522 03/26/17 1540 03/27/17 0236  NA 137  --  137 131*  K 3.8  --  3.1* 3.9  CL 108  --  100* 102  CO2 21*  --   --  21*  GLUCOSE 104*  --  137* 196*  BUN 12  --  11 10  CREATININE 1.18 1.18 1.00 1.33*  CALCIUM 7.4*  --   --  6.8*  MG  --  2.0  --  1.8   Liver Function Tests  Recent Labs  03/26/17 0830 03/27/17 0236  AST 174* 97*  ALT 36 32  ALKPHOS 24* 26*  BILITOT 1.7* 1.6*  PROT 4.6* 4.5*  ALBUMIN 3.6 3.2*   No results for input(s): LIPASE, AMYLASE in the last 72 hours. Cardiac Enzymes No results for input(s): CKTOTAL, CKMB, CKMBINDEX, TROPONINI in the last 72  hours.  BNP: BNP (last 3 results) No results for input(s): BNP in the last 8760 hours.  ProBNP (last 3 results) No results for input(s): PROBNP in the last 8760 hours.   D-Dimer No results for input(s): DDIMER in the last 72 hours. Hemoglobin A1C  Recent Labs  03/26/17 0613  HGBA1C 6.1*   Fasting Lipid Panel No results for input(s): CHOL, HDL, LDLCALC, TRIG, CHOLHDL, LDLDIRECT in the last 72 hours. Thyroid Function Tests No results for input(s): TSH, T4TOTAL, T3FREE, THYROIDAB in the last 72 hours.  Invalid input(s): FREET3  Other results:   Imaging    Dg Chest Port 1 View  Result Date: 03/27/2017 CLINICAL DATA:  Coronary bypass postop imaging EXAM: PORTABLE CHEST 1 VIEW COMPARISON:  03/26/2017 FINDINGS: Endotracheal tube is 4 cm above the carina. Enteric tube extends into the stomach and beyond the inferior edge of the image. Right jugular Swan-Ganz catheter tip is in the main pulmonary artery. Chest tubes and mediastinal drains are present. No pneumothorax. Probable small pleural effusions. Mild central and basilar airspace opacities have partially cleared compared to  03/26/2017. No new or worsening opacities. IMPRESSION: 1.  Support equipment appears satisfactorily positioned. 2. Slight improvement in the bilateral airspace opacities. Unchanged small effusions. Electronically Signed   By: Andreas Newport M.D.   On: 03/27/2017 04:58      Medications:     Scheduled Medications: . acetaminophen  1,000 mg Oral Q6H   Or  . acetaminophen (TYLENOL) oral liquid 160 mg/5 mL  1,000 mg Per Tube Q6H  . aspirin EC  325 mg Oral Daily   Or  . aspirin  324 mg Per Tube Daily  . bisacodyl  10 mg Oral Daily   Or  . bisacodyl  10 mg Rectal Daily  . chlorhexidine gluconate (MEDLINE KIT)  15 mL Mouth Rinse BID  . Chlorhexidine Gluconate Cloth  6 each Topical Daily  . docusate sodium  200 mg Oral Daily  . furosemide  40 mg Intravenous BID  . insulin aspart  0-24 Units Subcutaneous Q4H  . insulin detemir  24 Units Subcutaneous BID  . levalbuterol  1.25 mg Nebulization Q6H  . mouth rinse  15 mL Mouth Rinse QID  . metoCLOPramide (REGLAN) injection  10 mg Intravenous Q6H  . metoprolol tartrate  12.5 mg Oral BID  . pantoprazole (PROTONIX) IV  40 mg Intravenous Q24H  . rosuvastatin  40 mg Oral q1800  . sodium chloride flush  10-40 mL Intracatheter Q12H  . sodium chloride flush  3 mL Intravenous Q12H     Infusions: . sodium chloride 20 mL/hr at 03/27/17 0800  . sodium chloride    . sodium chloride 20 mL/hr at 03/27/17 0800  . amiodarone 30 mg/hr (03/27/17 0800)  . dexmedetomidine 0.799 mcg/kg/hr (03/27/17 0800)  . dextrose 10 mL/hr at 03/27/17 0800  . dextrose 5 % Impella 5.0 Purge solution    . EPINEPHrine 4 mg in dextrose 5% 250 mL infusion (16 mcg/mL) 3.013 mcg/min (03/27/17 0800)  . fentaNYL infusion INTRAVENOUS 100 mcg/hr (03/27/17 0800)  . insulin (NOVOLIN-R) infusion Stopped (03/26/17 1350)  . lactated ringers 20 mL/hr at 03/27/17 0800  . lactated ringers 20 mL/hr at 03/25/17 2115  . milrinone 0.3 mcg/kg/min (03/27/17 0800)  . nitroGLYCERIN Stopped  (03/25/17 1645)  . 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  Assessment/Plan   1. S/P CABG x2 with insertion Impella LD 5.0  2. Cardiogenic Shock in the setting of acute MI due to LM dissection Impella LD 5.0 .On P8Cardiac Output 5.1. On norepi 7 mcg + milrinone 0.3, and epi 3 mcg. Stop bb.   CVP trending up. Continue lasix 40 mg twice a day. May need to increased to 80 mg twice a day. Creatinine trending up. 1.0>1.33 Watch renal function closely.  3. Hyperlipidemia- on statin 4. Anemia- Hgb 10.2>8.3. Received 1UPRBCs.    Length of Stay: 2   Darrick Grinder, NP  03/27/2017, 8:08 AM  Advanced Heart Failure Team Pager [phone] (M-F; 7a - 4p)  Please contact [facility] for night-coverage after hours (4p -7a ) and weekends on [email or web address]  Agree with above.  Remains critically ill on VAD support and triple inotropes. Swan numbers and co-ox ok.   Respiratory status improved and we were able to extubated today with me at bedside. Was place on low-flow NO by nasal cannula.  Continues on lasix. Bedside echo done and very limited images. Unable to see LV well even with Definity. Overall EF looks like it might be reasonably ok except for apical HK but hard to know for sure.   Exam Awake moves all four Cor RRR sternal dressing in place Lungs mild rhonchi Ab obese NT/ND Ext warm mild edema  He remains tenuous but improving slowly. Now extubated. Continue NO. Continue Impella support (will remove Monday). VAD parameters stable. Continue diuresis. Wean pressors as tolerated. Family updated at bedside.  CRITICAL CARE Performed  by: Glori Bickers  Total critical care time: 50 minutes  Critical care time was exclusive of separately billable procedures and treating other patients.  Critical care was necessary to treat or prevent imminent or life-threatening deterioration.  Critical care was time spent personally by me (independent of midlevel providers or residents) on the following activities: development of treatment plan with patient and/or surrogate as well as nursing, discussions with consultants, evaluation of patient's response to treatment, examination of patient, obtaining history from patient or surrogate, ordering and performing treatments and interventions, ordering and review of laboratory studies, ordering and review of radiographic studies, pulse oximetry and re-evaluation of patient's condition.     Glori Bickers, MD  8:14 PM

## 2017-03-27 NOTE — Progress Notes (Signed)
  Echocardiogram 2D Echocardiogram has been performed.  Luis [name] 03/27/2017, 5:56 PM

## 2017-03-27 NOTE — Progress Notes (Signed)
Procedure(s) (LRB): CORONARY ARTERY BYPASS GRAFTING (CABG) x 2 using left internal mammary artery and right greater saphenous leg vein using endosciope. (N/A) TRANSESOPHAGEAL ECHOCARDIOGRAM (TEE) (N/A) PLACEMENT OF IMPELLA LEFT VENTRICULAR ASSIST DEVICE ATRIAL SEPTAL DEFECT (ASD) REPAIR (N/A) Subjective: Awake on vent - will wean and extubate to 4 ppm NO in nasal cannula VAD flow 3.7 L/min NE being weaned Good urine output Objective: Vital signs in last 24 hours: Temp:  [97.5 F (36.4 C)-100.6 F (38.1 C)] 100 F (37.8 C) (06/29 0830) Pulse Rate:  [88-93] 89 (06/29 0830) Cardiac Rhythm: Atrial paced (06/29 0800) Resp:  [0-22] 12 (06/29 0830) BP: (74-134)/(47-78) 103/66 (06/29 0800) SpO2:  [97 %-100 %] 100 % (06/29 0830) Arterial Line BP: (68-160)/(42-74) 122/63 (06/29 0830) FiO2 (%):  [40 %] 40 % (06/29 0755) Weight:  [207 lb 0.2 oz (93.9 kg)] 207 lb 0.2 oz (93.9 kg) (06/29 0500)  Hemodynamic parameters for last 24 hours: PAP: (19-45)/(8-30) 30/17 CVP:  [7 mmHg-36 mmHg] 16 mmHg CO:  [4.2 L/min-6 L/min] 5.2 L/min CI:  [2.1 L/min/m2-3.1 L/min/m2] 2.8 L/min/m2  Intake/Output from previous day: 06/28 0701 - 06/29 0700 In: 5311.3 [I.V.:4011.8; Blood:370; NG/GT:110; IV Piggyback:400] Out: 9924 [Urine:4185; Emesis/NG output:500; Chest Tube:810] Intake/Output this shift: Total I/O In: 186.1 [I.V.:170.7; Other:15.4] Out: 195 [Urine:45; Chest Tube:150]       Exam    General- alert and comfortable on vent   Lungs- clear without rales, wheezes   Cor- regular rate and rhythm, no murmur , gallop   Abdomen- soft, non-tender   Extremities - warm, non-tender, minimal edema   Neuro- oriented, appropriate, no focal weakness   Lab Results:  Recent Labs  03/26/17 1522 03/26/17 1540 03/27/17 0500  WBC 7.9  --  8.3  HGB 9.7* 10.2* 8.3*  HCT 28.1* 30.0* 23.5*  PLT 156  --  126*   BMET:  Recent Labs  03/26/17 0830  03/26/17 1540 03/27/17 0236  NA 137  --  137 131*  K  3.8  --  3.1* 3.9  CL 108  --  100* 102  CO2 21*  --   --  21*  GLUCOSE 104*  --  137* 196*  BUN 12  --  11 10  CREATININE 1.18  < > 1.00 1.33*  CALCIUM 7.4*  --   --  6.8*  < > = values in this interval not displayed.  PT/INR:  Recent Labs  03/25/17 1722  LABPROT 16.3*  INR 1.30   ABG    Component Value Date/Time   PHART 7.382 03/27/2017 0524   HCO3 23.4 03/27/2017 0524   TCO2 25 03/27/2017 0524   ACIDBASEDEF 1.0 03/27/2017 0524   O2SAT 97.0 03/27/2017 0524   CBG (last 3)   Recent Labs  03/26/17 2355 03/27/17 0351 03/27/17 0750  GLUCAP 179* 213* 169*    Assessment/Plan: S/P Procedure(s) (LRB): CORONARY ARTERY BYPASS GRAFTING (CABG) x 2 using left internal mammary artery and right greater saphenous leg vein using endosciope. (N/A) TRANSESOPHAGEAL ECHOCARDIOGRAM (TEE) (N/A) PLACEMENT OF IMPELLA LEFT VENTRICULAR ASSIST DEVICE ATRIAL SEPTAL DEFECT (ASD) REPAIR (N/Aextubate   LOS: 2 days  Extubate Cont VAD support until mon Patient condition and plan of care d/w family, questions answered  Luis [name] 03/27/2017

## 2017-03-27 NOTE — Plan of Care (Signed)
Problem: Activity: Goal: Risk for activity intolerance will decrease Outcome: Progressing Pt extubated 6/29. Remains on bedrest though for impella  Problem: Bowel/Gastric: Goal: Gastrointestinal status for postoperative course will improve Outcome: Not Progressing Pt NPO. No active BS  Problem: Cardiac: Goal: Hemodynamic stability will improve Outcome: Progressing Mostly stable on current gtt's. Occasional episodes of hypotension and hypertension.  Goal: Ability to maintain an adequate cardiac output will improve Outcome: Progressing Within parameters Goal: Will show no signs and symptoms of excessive bleeding Outcome: Progressing Drainage within parameters  Problem: Respiratory: Goal: Ability to tolerate decreased levels of ventilator support will improve Outcome: Completed/Met Date Met: 03/27/17 Extubated 6/29  Problem: Urinary Elimination: Goal: Ability to achieve and maintain adequate renal perfusion and functioning will improve Outcome: Progressing UOP within parameters

## 2017-03-27 NOTE — Op Note (Signed)
NAME:  Luis [name], Luis [name]                    ACCOUNT NO.:  MEDICAL RECORD NO.:  [id number]  LOCATION:                                 FACILITY:  PHYSICIAN:  [name], M.D.       DATE OF BIRTH:  DATE OF PROCEDURE:  03/25/2017 DATE OF DISCHARGE:                              OPERATIVE REPORT   OPERATION: 1. Emergency coronary artery bypass grafting x2 (left internal mammary     artery to LAD, saphenous vein graft to circumflex marginal). 2. Closure of patent foramen ovale. 3. Placement of percutaneous left ventricular assist device, Impella     LD via 10-mm Hemashield graft sewn to the ascending aorta. 4. Endoscopic harvest of right leg greater saphenous vein.  SURGEON:  [name], M.D.  ASSISTANT:  Lars Pinks, PA-C.  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  HISTORY AND INDICATION:  The patient is a 71 year old Asian male with previous history of anterior MI.  Recently evaluated with a stress test which was positive for anterior ischemia.  Cardiac catheterization by Dr. [name] demonstrated chronic occlusion of the LAD, which was fed via collaterals from a co-dominant right coronary.  An attempt at opening the chronic occlusion resulted in left main dissection, and the patient became profoundly ischemic, hypotensive requiring high-dose norepinephrine, and intubation in the cath lab.  Emergency cardiac surgical evaluation was requested.  I examined the patient in the cardiac cath lab and  discussed the patient's coronary arteriograms in the cath lab with the patient's cardiologist for coordination of care. Dr. [name] recommended emergency surgical coronary revascularization.  I agreed with that recommendation, although I felt that it would be at high risk taking patient in cardiogenic shock to the operating room, but that was his only chance for meaningful recovery.  I discussed the situation with the patient in the cath lab waiting room with Dr. [name] and discussed the procedure of emergency CABG with the patient's family. I also discussed the probability he would need mechanical cardiac support because of his previous moderate LV dysfunction and acute cardiogenic shock.  Informed consent was obtained.  OPERATIVE FINDINGS: 1. Global biventricular dysfunction upon initial evaluation of the     heart with improvement after CABG, closure of PFO, and placement of     Impella LV ventricular assist device. 2. Adequate conduit and adequate targets for grafting. 3. Intraoperative coagulopathy from a load of Brilinta requiring blood     product administration. 4. Improvement of global LV function after separation from     cardiopulmonary bypass and after closure of chest with resolution     of metabolic acidosis and improved hemodynamics.  OPERATIVE PROCEDURE:  The patient was brought directly from the cath lab straight to the operating room and placed supine on the operating  table. General anesthesia was induced as the patient had been previously intubated.  A PA catheter was placed by the Anesthesia team as well as a radial artery A-line.  The patient was quickly prepped and draped as a sterile field.  A proper time-out was performed.  A sternal incision was made.  The right greater saphenous vein was harvested using endoscopic endo vein technique.  The left internal mammary artery was harvested as a pedicle graft from its origin at the subclavian vessels.  The lungs were  noted to be hyperinflated and discolored with carbon-tar smoke exposure consistent with emphysema.  The sternal retractor was then placed and the mammary pedicle was wrapped in a gauze soaked in papaverine saline.  The pericardium was opened and suspended.  Pursestrings were placed in ascending aorta and right atrium.  The patient was given heparin.  The patient was cannulated, placed on cardiopulmonary bypass.  A second venous cannula was placed for bicaval drainage and caval loops were placed.  The coronaries were identified for grafting.  The LAD and OM circumflex were found to be adequate targets.  Cardioplegia cannulas were placed both antegrade and retrograde cold blood cardioplegia, and the patient was cooled to 30 degrees.  The crossclamp was applied and one liter of cold blood cardioplegia was delivered in split doses between the antegrade and retrograde coronary catheters.  There was good cardioplegic arrest and septal temperature dropped to less than 14 degrees.  A PFO identified by TEE was then approached.  The caval tapes were tightened.  A right atriotomy was performed.  The foramen ovale was identified.  A slit measuring 1.5 cm was identified.  This was closed with 2 pledgeted 4-0 Prolene sutures.  The atriotomy was then closed in 2 layers using a running 4-0 Prolene.  The caval tapes were released. Cardioplegia was redosed.  The coronary anastomoses were performed.  The first distal anastomosis was the circumflex branch.  This was a 1.7-mm vessel with proximal 90% stenosis.  A reverse saphenous vein was sewn end-to-side with running 7- 0 Prolene with good flow through the graft.  Cardioplegia was dosed.  The second distal anastomosis was the mid to distal LAD.  The vessel was a 1.5-mm vessel, totally occluded proximally.  A left internal mammary artery pedicle was brought through an opening in the pericardium and brought down to the 1.5-mm LAD and sewn end-to-side with  running 8-0 Prolene.  There was good flow through the anastomosis after briefly releasing the pedicle bulldog on the mammary artery pedicle.  The bulldog was reapplied and the pedicle was secured to the epicardium. Cardioplegia was redosed.  The crossclamp was still in place.  An aortotomy was performed on the ascending aorta beneath the crossclamp, and a 10-mm graft was sewn end- to-side with running a 4-0 Prolene.  This was clamped with a vascular clamp.  This was for the placement of the percutaneous ventricular assist device.  Next, after the graft was sewn to the ascending aorta.  The proximal vein anastomosis was constructed with a running 6-0 Prolene and a 4.5-mm punch.  Air was then vented from the coronaries with a dose of retrograde warm blood cardioplegia, and the crossclamp was removed.  The cardioplegia cannulas were removed.  The vein graft was de-aired and open, and there was good flow through each bypass graft, and hemostasis was documented at the proximal and distal anastomoses.  While the patient was being rewarmed and reperfused, an Impella LV catheter was brought into the  pericardium through the small incision in the right lower neck.  Using the stopper was placed in the graft, the Impella was directed into the LV catheter through the aortic valve using transesophageal echo guidance.  It was felt to be an appropriate spot and the catheter was secured to the graft and the graft was exited through the opening in the neck.  The patient was then rewarmed and reperfused and temporary pacing wires were applied.  The lungs were expanded, the ventilator was resumed.  The patient was started on inotropic support for the right ventricle.  This included inhaled nitric oxide.  The patient was then weaned from cardiopulmonary bypass.  He came off bypass smoothly, and the Impella LV catheter was manipulated to a position so that the end of it was approximately 3 cm below the  aortic valve leaflets at the anulus and the outflow end of the catheter was in the aorta proximal to the anastomosis of the graft.  After bypass and heparin was administered without adverse reaction, the patient received anticoagulant.  He was given platelets and FFP with improved coagulation function.  The pericardium was loosely closed.  The sternum was closed.  The Impella catheter was beneath the sternal closure and function remained good with flow at 4 L.  The pectoralis fascia was closed running Vicryl, and the subcutaneous and skin layers were closed running Vicryl.  Total cardiopulmonary bypass time was 155 minutes.  The patient was then returned to the ICU in critical but stable condition.     [name], M.D.     PV/MEDQ  D:  03/26/2017  T:  03/26/2017  Job:  [id number]

## 2017-03-27 NOTE — Progress Notes (Signed)
NIF -40 VC 760mls

## 2017-03-27 NOTE — Progress Notes (Signed)
Arrived to patient room to perform parameters.  NIF was performed and a -26 was obtained, however with the vital capacity patient only obtained 750.  After doing parameters patient began to become tachypniec.  RN is paging MD.  Will continue to monitor.

## 2017-03-27 NOTE — Plan of Care (Signed)
Problem: Activity: Goal: Risk for activity intolerance will decrease Outcome: Not Progressing Pt remains intubated and sedated w/ Impella  Problem: Bowel/Gastric: Goal: Gastrointestinal status for postoperative course will improve Outcome: Not Progressing No active BS  Problem: Cardiac: Goal: Hemodynamic stability will improve Outcome: Progressing Tolerating current gtt's Goal: Ability to maintain an adequate cardiac output will improve Outcome: Progressing Within parameters

## 2017-03-27 NOTE — Progress Notes (Signed)
CRITICAL VALUE ALERT  Critical Value:  APTT greater than 200  Date & Time Notied:  0530 03/27/2017  Provider Notified: [name]  Orders Received/Actions taken: Stop D5 Heparin solution and start D10 infusion

## 2017-03-27 NOTE — Progress Notes (Signed)
MD PVT made aware of extremely liable BP for ~2 hours. CVP 13. Systolic ranging from 01-655 and MAP's from 40's to 70's. Vital signs, VAD numbers, and labs reviewed. Orders received to stop lasix drip, 1 unit prbc, 1 albumin with calcium, and to move inhaled nitric to 5ppm and terminate wean. Will continue to monitor closely. Eleonore Chiquito RN 2 Heart

## 2017-03-28 ENCOUNTER — Inpatient Hospital Stay (HOSPITAL_COMMUNITY): Payer: Medicare Other

## 2017-03-28 DIAGNOSIS — R570 Cardiogenic shock: Secondary | ICD-10-CM

## 2017-03-28 DIAGNOSIS — J9601 Acute respiratory failure with hypoxia: Secondary | ICD-10-CM

## 2017-03-28 DIAGNOSIS — N179 Acute kidney failure, unspecified: Secondary | ICD-10-CM

## 2017-03-28 DIAGNOSIS — Z951 Presence of aortocoronary bypass graft: Secondary | ICD-10-CM

## 2017-03-28 DIAGNOSIS — Z95811 Presence of heart assist device: Secondary | ICD-10-CM

## 2017-03-28 HISTORY — DX: Acute kidney failure, unspecified: N17.9

## 2017-03-28 HISTORY — DX: Cardiogenic shock: R57.0

## 2017-03-28 HISTORY — DX: Acute respiratory failure with hypoxia: J96.01

## 2017-03-28 LAB — CBC
HCT: 21.7 % — ABNORMAL LOW (ref 39.0–52.0)
HCT: 23.9 % — ABNORMAL LOW (ref 39.0–52.0)
HCT: 29.4 % — ABNORMAL LOW (ref 39.0–52.0)
Hemoglobin: 7.6 g/dL — ABNORMAL LOW (ref 13.0–17.0)
Hemoglobin: 8.2 g/dL — ABNORMAL LOW (ref 13.0–17.0)
Hemoglobin: 10.2 g/dL — ABNORMAL LOW (ref 13.0–17.0)
MCH: 29.7 pg (ref 26.0–34.0)
MCH: 28.9 pg (ref 26.0–34.0)
MCH: 28.9 pg (ref 26.0–34.0)
MCHC: 35 g/dL (ref 30.0–36.0)
MCHC: 34.3 g/dL (ref 30.0–36.0)
MCHC: 34.7 g/dL (ref 30.0–36.0)
MCV: 84.8 fL (ref 78.0–100.0)
MCV: 84.2 fL (ref 78.0–100.0)
MCV: 83.3 fL (ref 78.0–100.0)
Platelets: 90 10*3/uL — ABNORMAL LOW (ref 150–400)
Platelets: 70 10*3/uL — ABNORMAL LOW (ref 150–400)
Platelets: 71 10*3/uL — ABNORMAL LOW (ref 150–400)
RBC: 2.56 MIL/uL — ABNORMAL LOW (ref 4.22–5.81)
RBC: 2.84 MIL/uL — ABNORMAL LOW (ref 4.22–5.81)
RBC: 3.53 MIL/uL — ABNORMAL LOW (ref 4.22–5.81)
RDW: 15.6 % — ABNORMAL HIGH (ref 11.5–15.5)
RDW: 15.5 % (ref 11.5–15.5)
RDW: 15.5 % (ref 11.5–15.5)
WBC: 11.2 10*3/uL — ABNORMAL HIGH (ref 4.0–10.5)
WBC: 11.1 10*3/uL — ABNORMAL HIGH (ref 4.0–10.5)
WBC: 9.9 10*3/uL (ref 4.0–10.5)

## 2017-03-28 LAB — GLUCOSE, CAPILLARY
Glucose-Capillary: 106 mg/dL — ABNORMAL HIGH (ref 65–99)
Glucose-Capillary: 120 mg/dL — ABNORMAL HIGH (ref 65–99)
Glucose-Capillary: 137 mg/dL — ABNORMAL HIGH (ref 65–99)
Glucose-Capillary: 137 mg/dL — ABNORMAL HIGH (ref 65–99)
Glucose-Capillary: 139 mg/dL — ABNORMAL HIGH (ref 65–99)
Glucose-Capillary: 147 mg/dL — ABNORMAL HIGH (ref 65–99)
Glucose-Capillary: 148 mg/dL — ABNORMAL HIGH (ref 65–99)
Glucose-Capillary: 151 mg/dL — ABNORMAL HIGH (ref 65–99)

## 2017-03-28 LAB — COOXEMETRY PANEL
Carboxyhemoglobin: 1.8 % — ABNORMAL HIGH (ref 0.5–1.5)
Methemoglobin: 1.5 % (ref 0.0–1.5)
O2 Saturation: 66.2 %
Total hemoglobin: 7.2 g/dL — ABNORMAL LOW (ref 12.0–16.0)

## 2017-03-28 LAB — POCT ACTIVATED CLOTTING TIME
Activated Clotting Time: 164 seconds
Activated Clotting Time: 164 seconds
Activated Clotting Time: 169 seconds
Activated Clotting Time: 169 seconds
Activated Clotting Time: 169 seconds
Activated Clotting Time: 169 seconds
Activated Clotting Time: 169 seconds
Activated Clotting Time: 175 seconds

## 2017-03-28 LAB — POCT I-STAT 3, ART BLOOD GAS (G3+)
Acid-Base Excess: 3 mmol/L — ABNORMAL HIGH (ref 0.0–2.0)
Acid-Base Excess: 1 mmol/L (ref 0.0–2.0)
Bicarbonate: 26 mmol/L (ref 20.0–28.0)
Bicarbonate: 24.3 mmol/L (ref 20.0–28.0)
O2 Saturation: 98 %
O2 Saturation: 97 %
Patient temperature: 37.2
Patient temperature: 99
TCO2: 27 mmol/L (ref 0–100)
TCO2: 25 mmol/L (ref 0–100)
pCO2 arterial: 34.6 mmHg (ref 32.0–48.0)
pCO2 arterial: 32.2 mmHg (ref 32.0–48.0)
pH, Arterial: 7.484 — ABNORMAL HIGH (ref 7.350–7.450)
pH, Arterial: 7.485 — ABNORMAL HIGH (ref 7.350–7.450)
pO2, Arterial: 92 mmHg (ref 83.0–108.0)
pO2, Arterial: 85 mmHg (ref 83.0–108.0)

## 2017-03-28 LAB — COMPREHENSIVE METABOLIC PANEL
ALT: 29 U/L (ref 17–63)
AST: 66 U/L — ABNORMAL HIGH (ref 15–41)
Albumin: 3.1 g/dL — ABNORMAL LOW (ref 3.5–5.0)
Alkaline Phosphatase: 32 U/L — ABNORMAL LOW (ref 38–126)
Anion gap: 8 (ref 5–15)
BUN: 13 mg/dL (ref 6–20)
CO2: 24 mmol/L (ref 22–32)
Calcium: 7.4 mg/dL — ABNORMAL LOW (ref 8.9–10.3)
Chloride: 99 mmol/L — ABNORMAL LOW (ref 101–111)
Creatinine, Ser: 1.38 mg/dL — ABNORMAL HIGH (ref 0.61–1.24)
GFR calc Af Amer: 58 mL/min — ABNORMAL LOW (ref >60)
GFR calc non Af Amer: 50 mL/min — ABNORMAL LOW (ref >60)
Glucose, Bld: 140 mg/dL — ABNORMAL HIGH (ref 65–99)
Potassium: 3.7 mmol/L (ref 3.5–5.1)
Sodium: 131 mmol/L — ABNORMAL LOW (ref 135–145)
Total Bilirubin: 2.5 mg/dL — ABNORMAL HIGH (ref 0.3–1.2)
Total Protein: 5 g/dL — ABNORMAL LOW (ref 6.5–8.1)

## 2017-03-28 LAB — LACTATE DEHYDROGENASE: LDH: 518 U/L — ABNORMAL HIGH (ref 98–192)

## 2017-03-28 LAB — CALCIUM, IONIZED: Calcium, Ionized, Serum: 3.9 mg/dL — ABNORMAL LOW (ref 4.5–5.6)

## 2017-03-28 LAB — APTT: aPTT: 64 seconds — ABNORMAL HIGH (ref 24–36)

## 2017-03-28 LAB — PREPARE RBC (CROSSMATCH)

## 2017-03-28 LAB — MAGNESIUM: Magnesium: 1.8 mg/dL (ref 1.7–2.4)

## 2017-03-28 MED ORDER — POTASSIUM CHLORIDE 10 MEQ/50ML IV SOLN
10.0000 meq | INTRAVENOUS | Status: AC
  Administered 2017-03-28 (×3): 10 meq via INTRAVENOUS
  Filled 2017-03-28 (×3): qty 50

## 2017-03-28 MED ORDER — MAGNESIUM SULFATE 2 GM/50ML IV SOLN
2.0000 g | Freq: Once | INTRAVENOUS | Status: AC
  Administered 2017-03-28: 2 g via INTRAVENOUS
  Filled 2017-03-28: qty 50

## 2017-03-28 MED ORDER — SPIRONOLACTONE 25 MG PO TABS
12.5000 mg | ORAL_TABLET | Freq: Every day | ORAL | Status: DC
  Administered 2017-03-29 – 2017-04-10 (×12): 12.5 mg via ORAL
  Filled 2017-03-28 (×13): qty 1

## 2017-03-28 MED ORDER — SODIUM CHLORIDE 0.9 % IV SOLN
Freq: Once | INTRAVENOUS | Status: AC
  Administered 2017-03-28: 16:00:00 via INTRAVENOUS

## 2017-03-28 MED ORDER — SODIUM CHLORIDE 0.9 % IV SOLN
Freq: Once | INTRAVENOUS | Status: AC
  Administered 2017-03-28: 10:00:00 via INTRAVENOUS

## 2017-03-28 NOTE — Progress Notes (Signed)
3 Days Post-Op Procedure(s) (LRB): CORONARY ARTERY BYPASS GRAFTING (CABG) x 2 using left internal mammary artery and right greater saphenous leg vein using endosciope. (N/A) TRANSESOPHAGEAL ECHOCARDIOGRAM (TEE) (N/A) PLACEMENT OF IMPELLA LEFT VENTRICULAR ASSIST DEVICE ATRIAL SEPTAL DEFECT (ASD) REPAIR (N/A) Subjective: No complaints  Objective: Vital signs in last 24 hours: Temp:  [98.8 F (37.1 C)-100.8 F (38.2 C)] 99.9 F (37.7 C) (06/30 1400) Pulse Rate:  [79-93] 79 (06/30 1400) Cardiac Rhythm: Normal sinus rhythm (06/30 1002) Resp:  [12-32] 18 (06/30 1400) BP: (84-129)/(59-77) 99/64 (06/30 1400) SpO2:  [95 %-100 %] 100 % (06/30 1443) Arterial Line BP: (81-160)/(43-70) 96/54 (06/30 1400) Weight:  [91.4 kg (201 lb 8 oz)] 91.4 kg (201 lb 8 oz) (06/30 0500)  Hemodynamic parameters for last 24 hours: PAP: (27-60)/(9-21) 32/17 CVP:  [9 mmHg-17 mmHg] 9 mmHg PCWP:  [13 mmHg] 13 mmHg CO:  [5.5 L/min-6.5 L/min] 6.4 L/min CI:  [2.8 L/min/m2-3.4 L/min/m2] 3.3 L/min/m2  Intake/Output from previous day: 06/29 0701 - 06/30 0700 In: 3685.1 [I.V.:2998.6; NG/GT:30; IV Piggyback:300] Out: 3760 [Urine:3000; Emesis/NG output:300; Chest Tube:460] Intake/Output this shift: Total I/O In: 1304.4 [I.V.:663.7; Blood:335; Other:105.7; IV Piggyback:200] Out: 1500 [Urine:1500]  General appearance: alert and cooperative Neurologic: intact Heart: regular rate and rhythm, S1, S2 normal, no murmur, click, rub or gallop Lungs: diminished breath sounds bibasilar Extremities: edema moderate Wound: chest dressing intact  Lab Results:  Recent Labs  03/28/17 0304 03/28/17 1420  WBC 11.2* 11.1*  HGB 7.6* 8.2*  HCT 21.7* 23.9*  PLT 90* 70*   BMET:  Recent Labs  03/27/17 0236 03/27/17 1601 03/28/17 0304  NA 131* 132* 131*  K 3.9 3.7 3.7  CL 102 96* 99*  CO2 21*  --  24  GLUCOSE 196* 152* 140*  BUN 10 12 13   CREATININE 1.33* 1.30* 1.38*  CALCIUM 6.8*  --  7.4*    PT/INR:  Recent  Labs  03/25/17 1722  LABPROT 16.3*  INR 1.30   ABG    Component Value Date/Time   PHART 7.484 (H) 03/28/2017 0503   HCO3 26.0 03/28/2017 0503   TCO2 27 03/28/2017 0503   ACIDBASEDEF 1.0 03/27/2017 0524   O2SAT 98.0 03/28/2017 0503   CBG (last 3)   Recent Labs  03/28/17 0404 03/28/17 0754 03/28/17 1229  GLUCAP 139* 147* 151*   CLINICAL DATA:  Post cardiac surgery, LEFT ventricular assist device  EXAM: PORTABLE CHEST 1 VIEW  COMPARISON:  Portable exam 0519 hours compared to 03/27/2017  FINDINGS: Interval removal of nasogastric and endotracheal tubes.  Impella device unchanged breaking over RIGHT heart.  RIGHT jugular Swan-Ganz catheter with tip projecting over proximal RIGHT pulmonary artery.  Mediastinal drain and BILATERAL thoracostomy tubes present.  Epicardial pacing wires present.  Stable heart size post CABG.  Bibasilar effusions and atelectasis.  No pneumothorax.  IMPRESSION: Persistent bibasilar effusions and atelectasis.  Stable support devices.   Electronically Signed   By: Lavonia Dana M.D.   On: 03/28/2017 09:13  Assessment/Plan: S/P Procedure(s) (LRB): CORONARY ARTERY BYPASS GRAFTING (CABG) x 2 using left internal mammary artery and right greater saphenous leg vein using endosciope. (N/A) TRANSESOPHAGEAL ECHOCARDIOGRAM (TEE) (N/A) PLACEMENT OF IMPELLA LEFT VENTRICULAR ASSIST DEVICE ATRIAL SEPTAL DEFECT (ASD) REPAIR (N/A)  He is hemodynamically stable on milrinone 3, epi 3, levophed 2, NO 2ppm, Impella on P8 with 3.6 L flow. PA 32/17. CI 3.3 this am. Will wean off NO, wean levophed as tolerated. Continue milrinone and epi.  Maintaining sinus rhythm on amio drip.  Acute  kidney injury from preop shock: improved.   Volume excess: continue diuresis.  Postop acute blood loss anemia: transfused 1 unit prbc's this am. Will repeat Hgb later today.  CXR with bilateral atelectasis: continue IS.  Discussed status with family  at bedside. Dr. PVT will review tomorrow and decide about timing of Impella wean and removal.       LOS: 3 days    Gaye Pollack 03/28/2017

## 2017-03-28 NOTE — Progress Notes (Signed)
Per MD, wean nitric down to zero and cut off.  Nitric is currently off and patient is tolerating well.  Will continue to monitor.

## 2017-03-28 NOTE — Progress Notes (Signed)
Advanced Heart Failure Rounding Note  PCP:  Primary Cardiologist: Dr Nadyne Coombes  Subjective:    S/p emergent CABG x2 and Impella placement 03/25/17 for LM dissection  Extubated 6/29.  C/o chest soreness otherwise ok. Impella P-6 with 3.6L flow. On NE 2, Epi 3, milrinone 0.3 NO at 4  CVP 9 PAP 29/11 CI 6.7 CO 3.3    Objective:   Weight Range: 91.4 kg (201 lb 8 oz) Body mass index is 31.09 kg/m.   Vital Signs:   Temp:  [98.8 F (37.1 C)-100.8 F (38.2 C)] 98.8 F (37.1 C) (06/30 0800) Pulse Rate:  [86-93] 89 (06/30 0800) Resp:  [12-32] 27 (06/30 0800) BP: (84-129)/(54-77) 129/77 (06/30 0800) SpO2:  [91 %-100 %] 100 % (06/30 0800) Arterial Line BP: (81-160)/(43-69) 153/69 (06/30 0800) FiO2 (%):  [40 %] 40 % (06/29 1351) Weight:  [91.4 kg (201 lb 8 oz)] 91.4 kg (201 lb 8 oz) (06/30 0500) Last BM Date:  (PTA)  Weight change: Filed Weights   03/26/17 0500 03/27/17 0500 03/28/17 0500  Weight: 94.1 kg (207 lb 7.3 oz) 93.9 kg (207 lb 0.2 oz) 91.4 kg (201 lb 8 oz)    Intake/Output:   Intake/Output Summary (Last 24 hours) at 03/28/17 0857 Last data filed at 03/28/17 0800  Gross per 24 hour  Intake           3620.7 ml  Output             3915 ml  Net           -294.3 ml      Physical Exam   General: Awake. NAD. Follows commands HEENT: normal Neck: supple. RIJ swan  Carotids 2+ bilat; no bruits. No lymphadenopathy or thryomegaly appreciated. Cor: Sternal dressings intact. RRR. + impella hum  Lungs: clear Abdomen: soft, nontender, nondistended. No hepatosplenomegaly. No bruits or masses. Hypoactive bowel sounds. Extremities: no cyanosis, clubbing, rash, edema Neuro: alert & orientedx3, cranial nerves grossly intact. moves all 4 extremities w/o difficulty. Affect pleasant    Telemetry   A paced 90 bpm. (underneat NSR 85)  Personally reviewed   EKG    N/A  Labs    CBC  Recent Labs  03/26/17 0239  03/27/17 0500 03/27/17 1601 03/28/17 0304  WBC  7.9  < > 8.3  --  11.2*  NEUTROABS 6.0  --   --   --   --   HGB 7.5*  < > 8.3* 9.5* 7.6*  HCT 22.8*  < > 23.5* 28.0* 21.7*  MCV 88.4  < > 85.8  --  84.8  PLT 170  < > 126*  --  90*  < > = values in this interval not displayed. Basic Metabolic Panel  Recent Labs  03/27/17 0236 03/27/17 1601 03/28/17 0304  NA 131* 132* 131*  K 3.9 3.7 3.7  CL 102 96* 99*  CO2 21*  --  24  GLUCOSE 196* 152* 140*  BUN 10 12 13   CREATININE 1.33* 1.30* 1.38*  CALCIUM 6.8*  --  7.4*  MG 1.8  --  1.8   Liver Function Tests  Recent Labs  03/27/17 0236 03/28/17 0304  AST 97* 66*  ALT 32 29  ALKPHOS 26* 32*  BILITOT 1.6* 2.5*  PROT 4.5* 5.0*  ALBUMIN 3.2* 3.1*   No results for input(s): LIPASE, AMYLASE in the last 72 hours. Cardiac Enzymes No results for input(s): CKTOTAL, CKMB, CKMBINDEX, TROPONINI in the last 72 hours.  BNP: BNP (last 3  results) No results for input(s): BNP in the last 8760 hours.  ProBNP (last 3 results) No results for input(s): PROBNP in the last 8760 hours.   D-Dimer No results for input(s): DDIMER in the last 72 hours. Hemoglobin A1C  Recent Labs  03/26/17 0613  HGBA1C 6.1*   Fasting Lipid Panel No results for input(s): CHOL, HDL, LDLCALC, TRIG, CHOLHDL, LDLDIRECT in the last 72 hours. Thyroid Function Tests No results for input(s): TSH, T4TOTAL, T3FREE, THYROIDAB in the last 72 hours.  Invalid input(s): FREET3  Other results:   Imaging    No results found.   Medications:     Scheduled Medications: . acetaminophen  1,000 mg Oral Q6H   Or  . acetaminophen (TYLENOL) oral liquid 160 mg/5 mL  1,000 mg Per Tube Q6H  . aspirin EC  325 mg Oral Daily   Or  . aspirin  324 mg Per Tube Daily  . bisacodyl  10 mg Oral Daily   Or  . bisacodyl  10 mg Rectal Daily  . chlorhexidine gluconate (MEDLINE KIT)  15 mL Mouth Rinse BID  . Chlorhexidine Gluconate Cloth  6 each Topical Daily  . docusate sodium  200 mg Oral Daily  . furosemide  40 mg  Intravenous BID  . insulin aspart  0-24 Units Subcutaneous Q4H  . insulin detemir  24 Units Subcutaneous BID  . levalbuterol  1.25 mg Nebulization Q6H  . mouth rinse  15 mL Mouth Rinse QID  . metoCLOPramide (REGLAN) injection  10 mg Intravenous Q6H  . pantoprazole (PROTONIX) IV  40 mg Intravenous Q24H  . rosuvastatin  40 mg Oral q1800  . sodium chloride flush  10-40 mL Intracatheter Q12H  . sodium chloride flush  3 mL Intravenous Q12H    Infusions: . sodium chloride 20 mL/hr at 03/28/17 0800  . sodium chloride    . sodium chloride 20 mL/hr at 03/28/17 0800  . amiodarone 30 mg/hr (03/28/17 0800)  . cefUROXime (ZINACEF)  IV Stopped (03/27/17 2228)  . dexmedetomidine 0.201 mcg/kg/hr (03/28/17 0800)  . dextrose 5 % Impella 5.0 Purge solution Stopped (03/27/17 0842)  . EPINEPHrine 4 mg in dextrose 5% 250 mL infusion (16 mcg/mL) 3.013 mcg/min (03/28/17 0800)  . impella catheter heparin 25 unit/mL in dextrose 5%    . lactated ringers 20 mL/hr at 03/28/17 0800  . lactated ringers 20 mL/hr at 03/25/17 2115  . milrinone 0.3 mcg/kg/min (03/28/17 0800)  . nitroGLYCERIN Stopped (03/25/17 1645)  . norepinephrine (LEVOPHED) Adult infusion 4 mcg/min (03/28/17 0800)  . 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 Waveforms and output look good. Reviewed with Impella rep - Remains on Epi/NE and milrinone. Co-ox 66% Wean NE today. Can cut NO to 2. Likely off later today - Continue  lasix 40 iv BID - Echo 03/27/17 Very poor images even with Definity. Suspect EF ~45% - Will dangle today - Back to OR on Monday for Impella removal - A pcing turned back to 70bpm. Now with NSR  at 50.   2. CAD - s/p CABG - Continue ASA and statin  3. Acute respiratory failure -Extubated 6/29. Respiratory status improved. Mild edema/effusions on CXR today (viewed personally). Continue IV lasix.   4. Acute blood loss anemia - Transfuse 1u RBCs today Hgb 10.2>8.3. Received 1UPRBCs.   5. AKI - Due to shock. Creatinine stabilizing. 1.38 today  6. Hypokalemia/Hypomag - Will supp today - Start spiro 12.5  CRITICAL CARE Performed by: Glori Bickers  Total critical care time: 45 minutes  Critical care time was exclusive of separately billable procedures and treating other patients.  Critical care was necessary to treat or prevent imminent or life-threatening deterioration.  Critical care was time spent personally by me (independent of midlevel providers or residents) on the following activities: development of treatment plan with patient and/or surrogate as well as nursing, discussions with consultants, evaluation of patient's response to treatment, examination of patient, obtaining history from patient or surrogate, ordering and performing treatments and interventions, ordering and review of laboratory studies, ordering and review of radiographic studies, pulse oximetry and re-evaluation of patient's condition.   Length of Stay: 3  Glori Bickers, MD  03/28/2017, 8:57 AM  Advanced Heart Failure Team Pager [phone] (M-F; 7a - 4p)  Please contact [facility] for night-coverage after hours (4p -7a ) and weekends on [email or web address]

## 2017-03-28 NOTE — Progress Notes (Signed)
[place] for heparin (not currently receiving systemic heparin) Indication: Impella 5.0  Allergies  Allergen Reactions  . No Known Allergies     Patient Measurements: Height: 5' 7.5" (171.5 cm) Weight: 201 lb 8 oz (91.4 kg) IBW/kg (Calculated) : 67.25 Heparin Dosing Weight: 81.6 kg  Vital Signs: Temp: 98.8 F (37.1 C) (06/30 0800) BP: 129/77 (06/30 0800) Pulse Rate: 89 (06/30 0800)  Labs:  Recent Labs  03/25/17 1722  03/26/17 0239  03/26/17 1522  03/27/17 0236 03/27/17 0500 03/27/17 1601 03/28/17 0304  HGB 9.6*  < > 7.5*  --  9.7*  < >  --  8.3* 9.5* 7.6*  HCT 29.0*  < > 22.8*  --  28.1*  < >  --  23.5* 28.0* 21.7*  PLT 228  < > 170  --  156  --   --  126*  --  90*  APTT 42*  --  190*  --   --   --  >200*  --   --  64*  LABPROT 16.3*  --   --   --   --   --   --   --   --   --   INR 1.30  --   --   --   --   --   --   --   --   --   CREATININE  --   < > 1.26*  < > 1.18  < > 1.33*  --  1.30* 1.38*  < > = values in this interval not displayed.  Estimated Creatinine Clearance: 54.2 mL/min (A) (by C-G formula based on SCr of 1.38 mg/dL (H)).   Assessment: 71 yo male s/p CABG x 2 with Impella 5.0 placed 6/28.    Currently has heparin only in purge solution at 1/2 strength 25,000 units/L, running at 14.9 ml/hr = 370  units/hr.  ACTs at goal in 160s without the need for systemic heparin for now.    Hgb in 7s, pt to receive 1 unit today. Pltc also dropping to 90, will follow closely, may need to change to DTI if worsens.  Goal of Therapy:  ACTs 160-180 (although lower ACTs may be tolerated per discussion with Impella rep) Monitor platelets by anticoagulation protocol: Yes   Plan:  1. Pharmacy will continue to monitor peripherally. 2. No systemic heparin for now, continue current purge concentration.   Erin Hearing PharmD., BCPS Clinical Pharmacist Pager [phone] 03/28/2017 9:25 AM

## 2017-03-29 ENCOUNTER — Inpatient Hospital Stay (HOSPITAL_COMMUNITY): Payer: Medicare Other

## 2017-03-29 LAB — POCT I-STAT 3, ART BLOOD GAS (G3+)
Acid-Base Excess: 1 mmol/L (ref 0.0–2.0)
Acid-Base Excess: 2 mmol/L (ref 0.0–2.0)
Bicarbonate: 23.4 mmol/L (ref 20.0–28.0)
Bicarbonate: 23.6 mmol/L (ref 20.0–28.0)
O2 Saturation: 89 %
O2 Saturation: 93 %
Patient temperature: 98.6
Patient temperature: 99.2
TCO2: 24 mmol/L (ref 0–100)
TCO2: 25 mmol/L (ref 0–100)
pCO2 arterial: 29.8 mmHg — ABNORMAL LOW (ref 32.0–48.0)
pCO2 arterial: 29.6 mmHg — ABNORMAL LOW (ref 32.0–48.0)
pH, Arterial: 7.502 — ABNORMAL HIGH (ref 7.350–7.450)
pH, Arterial: 7.511 — ABNORMAL HIGH (ref 7.350–7.450)
pO2, Arterial: 51 mmHg — ABNORMAL LOW (ref 83.0–108.0)
pO2, Arterial: 60 mmHg — ABNORMAL LOW (ref 83.0–108.0)

## 2017-03-29 LAB — COOXEMETRY PANEL
Carboxyhemoglobin: 1.4 % (ref 0.5–1.5)
Carboxyhemoglobin: 2 % — ABNORMAL HIGH (ref 0.5–1.5)
Carboxyhemoglobin: 2.4 % — ABNORMAL HIGH (ref 0.5–1.5)
Methemoglobin: 1.2 % (ref 0.0–1.5)
Methemoglobin: 0.9 % (ref 0.0–1.5)
Methemoglobin: 1 % (ref 0.0–1.5)
O2 Saturation: 57 %
O2 Saturation: 62.4 %
O2 Saturation: 90.4 %
Total hemoglobin: 10.8 g/dL — ABNORMAL LOW (ref 12.0–16.0)
Total hemoglobin: 10.6 g/dL — ABNORMAL LOW (ref 12.0–16.0)
Total hemoglobin: 8.8 g/dL — ABNORMAL LOW (ref 12.0–16.0)

## 2017-03-29 LAB — GLUCOSE, CAPILLARY
Glucose-Capillary: 111 mg/dL — ABNORMAL HIGH (ref 65–99)
Glucose-Capillary: 132 mg/dL — ABNORMAL HIGH (ref 65–99)
Glucose-Capillary: 144 mg/dL — ABNORMAL HIGH (ref 65–99)
Glucose-Capillary: 124 mg/dL — ABNORMAL HIGH (ref 65–99)
Glucose-Capillary: 144 mg/dL — ABNORMAL HIGH (ref 65–99)
Glucose-Capillary: 125 mg/dL — ABNORMAL HIGH (ref 65–99)

## 2017-03-29 LAB — BPAM RBC
Blood Product Expiration Date: 201807022359
Blood Product Expiration Date: 201807102359
Blood Product Expiration Date: 201807102359
Blood Product Expiration Date: 201807102359
Blood Product Expiration Date: 201807102359
Blood Product Expiration Date: 201807102359
Blood Product Expiration Date: 201807102359
Blood Product Expiration Date: 201807102359
Blood Product Expiration Date: 201807102359
Blood Product Expiration Date: 201807102359
ISSUE DATE / TIME: 201806271000
ISSUE DATE / TIME: 201806271000
ISSUE DATE / TIME: 201806271000
ISSUE DATE / TIME: 201806271000
ISSUE DATE / TIME: 201806280327
ISSUE DATE / TIME: 201806280845
ISSUE DATE / TIME: 201806290418
ISSUE DATE / TIME: 201806300952
ISSUE DATE / TIME: 201806301539
Unit Type and Rh: 6200
Unit Type and Rh: 6200
Unit Type and Rh: 6200
Unit Type and Rh: 6200
Unit Type and Rh: 6200
Unit Type and Rh: 6200
Unit Type and Rh: 6200
Unit Type and Rh: 6200
Unit Type and Rh: 6200
Unit Type and Rh: 6200

## 2017-03-29 LAB — TYPE AND SCREEN
ABO/RH(D): A POS
Antibody Screen: NEGATIVE
Unit division: 0
Unit division: 0
Unit division: 0
Unit division: 0
Unit division: 0
Unit division: 0
Unit division: 0
Unit division: 0
Unit division: 0
Unit division: 0

## 2017-03-29 LAB — CBC
HCT: 27.5 % — ABNORMAL LOW (ref 39.0–52.0)
Hemoglobin: 9.5 g/dL — ABNORMAL LOW (ref 13.0–17.0)
MCH: 29 pg (ref 26.0–34.0)
MCHC: 34.5 g/dL (ref 30.0–36.0)
MCV: 83.8 fL (ref 78.0–100.0)
Platelets: 68 10*3/uL — ABNORMAL LOW (ref 150–400)
RBC: 3.28 MIL/uL — ABNORMAL LOW (ref 4.22–5.81)
RDW: 16 % — ABNORMAL HIGH (ref 11.5–15.5)
WBC: 10.2 10*3/uL (ref 4.0–10.5)

## 2017-03-29 LAB — COMPREHENSIVE METABOLIC PANEL
ALT: 26 U/L (ref 17–63)
AST: 49 U/L — ABNORMAL HIGH (ref 15–41)
Albumin: 2.9 g/dL — ABNORMAL LOW (ref 3.5–5.0)
Alkaline Phosphatase: 49 U/L (ref 38–126)
Anion gap: 11 (ref 5–15)
BUN: 16 mg/dL (ref 6–20)
CO2: 22 mmol/L (ref 22–32)
Calcium: 7.7 mg/dL — ABNORMAL LOW (ref 8.9–10.3)
Chloride: 98 mmol/L — ABNORMAL LOW (ref 101–111)
Creatinine, Ser: 1.17 mg/dL (ref 0.61–1.24)
GFR calc Af Amer: >60 mL/min (ref >60)
GFR calc non Af Amer: >60 mL/min (ref >60)
Glucose, Bld: 213 mg/dL — ABNORMAL HIGH (ref 65–99)
Potassium: 3.6 mmol/L (ref 3.5–5.1)
Sodium: 131 mmol/L — ABNORMAL LOW (ref 135–145)
Total Bilirubin: 2 mg/dL — ABNORMAL HIGH (ref 0.3–1.2)
Total Protein: 5.2 g/dL — ABNORMAL LOW (ref 6.5–8.1)

## 2017-03-29 LAB — POCT ACTIVATED CLOTTING TIME
Activated Clotting Time: 175 seconds
Activated Clotting Time: 169 seconds
Activated Clotting Time: 175 seconds
Activated Clotting Time: 175 s
Activated Clotting Time: 169 seconds
Activated Clotting Time: 164 s
Activated Clotting Time: 164 seconds
Activated Clotting Time: 169 seconds
Activated Clotting Time: 169 seconds

## 2017-03-29 LAB — CALCIUM, IONIZED: Calcium, Ionized, Serum: 4.3 mg/dL — ABNORMAL LOW (ref 4.5–5.6)

## 2017-03-29 LAB — LACTATE DEHYDROGENASE: LDH: 570 U/L — ABNORMAL HIGH (ref 98–192)

## 2017-03-29 LAB — APTT: aPTT: 64 seconds — ABNORMAL HIGH (ref 24–36)

## 2017-03-29 LAB — MAGNESIUM: Magnesium: 2.3 mg/dL (ref 1.7–2.4)

## 2017-03-29 MED ORDER — ENOXAPARIN SODIUM 40 MG/0.4ML ~~LOC~~ SOLN: 40.0000 mg | SUBCUTANEOUS | Status: DC

## 2017-03-29 MED ORDER — POTASSIUM CHLORIDE 10 MEQ/50ML IV SOLN
INTRAVENOUS | Status: AC
  Filled 2017-03-29: qty 50

## 2017-03-29 MED ORDER — POTASSIUM CHLORIDE 10 MEQ/50ML IV SOLN
10.0000 meq | INTRAVENOUS | Status: AC
  Administered 2017-03-29 (×3): 10 meq via INTRAVENOUS
  Filled 2017-03-29 (×2): qty 50

## 2017-03-29 MED ORDER — POTASSIUM CHLORIDE 20 MEQ/15ML (10%) PO SOLN
40.0000 meq | Freq: Once | ORAL | Status: AC
  Administered 2017-03-29: 40 meq

## 2017-03-29 MED ORDER — ORAL CARE MOUTH RINSE: 15.0000 mL | Freq: Two times a day (BID) | OROMUCOSAL | Status: DC

## 2017-03-29 MED ORDER — CHLORHEXIDINE GLUCONATE 0.12 % MT SOLN
15.0000 mL | Freq: Two times a day (BID) | OROMUCOSAL | Status: DC
  Administered 2017-03-29: 15 mL via OROMUCOSAL
  Filled 2017-03-29: qty 15

## 2017-03-29 MED ORDER — AMIODARONE IV BOLUS ONLY 150 MG/100ML
150.0000 mg | Freq: Once | INTRAVENOUS | Status: AC
  Administered 2017-03-29: 150 mg via INTRAVENOUS

## 2017-03-29 NOTE — Progress Notes (Signed)
Pt needs constant reinforcement with incentive spirometer and education to deep breath. Based on ABG result nurse reeducated pt on the importance to breath deep and use heart pillow to aid with pain. O2 increased to 6L and pt sitting high fowlers. Will continue to monitor.

## 2017-03-29 NOTE — Progress Notes (Signed)
4 Days Post-Op Procedure(s) (LRB): CORONARY ARTERY BYPASS GRAFTING (CABG) x 2 using left internal mammary artery and right greater saphenous leg vein using endosciope. (N/A) TRANSESOPHAGEAL ECHOCARDIOGRAM (TEE) (N/A) PLACEMENT OF IMPELLA LEFT VENTRICULAR ASSIST DEVICE ATRIAL SEPTAL DEFECT (ASD) REPAIR (N/A) Subjective:  Chest sore. Passing gas but no BM yet.  CI 2.4, CVP 8, PA 31/16, Co-ox 62.4 on milrinone 0.3, Impella P8 flow 3.3  Objective: Vital signs in last 24 hours: Temp:  [98.1 F (36.7 C)-99.5 F (37.5 C)] 99.3 F (37.4 C) (07/01 1400) Pulse Rate:  [78-87] 85 (07/01 1400) Cardiac Rhythm: Normal sinus rhythm (07/01 0816) Resp:  [17-27] 22 (07/01 1400) BP: (101-136)/(66-87) 101/72 (07/01 1400) SpO2:  [90 %-100 %] 93 % (07/01 1400) Arterial Line BP: (89-176)/(52-80) 132/52 (07/01 1400) Weight:  [89.5 kg (197 lb 5 oz)] 89.5 kg (197 lb 5 oz) (07/01 0500)  Hemodynamic parameters for last 24 hours: PAP: (21-46)/(10-22) 31/16 CVP:  [8 mmHg-12 mmHg] 8 mmHg PCWP:  [13 mmHg-21 mmHg] 13 mmHg CO:  [4.1 L/min-4.6 L/min] 4.6 L/min CI:  [2.1 L/min/m2-2.4 L/min/m2] 2.4 L/min/m2  Intake/Output from previous day: 06/30 0701 - 07/01 0700 In: 3158.6 [I.V.:1851; Blood:670; IV Piggyback:300] Out: 5365 [Urine:5175; Chest Tube:190] Intake/Output this shift: Total I/O In: 893.8 [I.V.:652.7; Other:91.1; IV Piggyback:150] Out: 1600 [Urine:1600]  General appearance: alert and cooperative Neurologic: intact Heart: regular rate and rhythm, S1, S2 normal, no murmur, click, rub or gallop Lungs: diminished breath sounds bibasilar Abdomen: soft, non-tender; bowel sounds normal; no masses,  no organomegaly Extremities: edema mild Wound: dressing dry  Lab Results:  Recent Labs  03/28/17 2020 03/29/17 0400  WBC 9.9 10.2  HGB 10.2* 9.5*  HCT 29.4* 27.5*  PLT 71* 68*   BMET:  Recent Labs  03/28/17 0304 03/29/17 0400  NA 131* 131*  K 3.7 3.6  CL 99* 98*  CO2 24 22  GLUCOSE 140*  213*  BUN 13 16  CREATININE 1.38* 1.17  CALCIUM 7.4* 7.7*    PT/INR: No results for input(s): LABPROT, INR in the last 72 hours. ABG    Component Value Date/Time   PHART 7.502 (H) 03/29/2017 0423   HCO3 23.4 03/29/2017 0423   TCO2 24 03/29/2017 0423   ACIDBASEDEF 1.0 03/27/2017 0524   O2SAT 62.4 03/29/2017 1225   CBG (last 3)   Recent Labs  03/29/17 0430 03/29/17 0756 03/29/17 1202  GLUCAP 111* 132* 144*   CLINICAL DATA:  CABG  EXAM: PORTABLE CHEST 1 VIEW  COMPARISON:  03/28/2017  FINDINGS: Changes of CABG. Bilateral chest tubes remain in place. No pneumothorax. Swan-Ganz catheter remains in the main pulmonary artery, stable. Small right pleural effusion. Bibasilar atelectasis, right greater than left and mild vascular congestion. Decreasing left effusion since prior study.  IMPRESSION: Continued bibasilar atelectasis, right greater than left and vascular congestion.  Small right effusion.  Decreasing left effusion.  No pneumothorax.   Electronically Signed   By: Rolm Baptise M.D.   On: 03/29/2017 10:06  Assessment/Plan: S/P Procedure(s) (LRB): CORONARY ARTERY BYPASS GRAFTING (CABG) x 2 using left internal mammary artery and right greater saphenous leg vein using endosciope. (N/A) TRANSESOPHAGEAL ECHOCARDIOGRAM (TEE) (N/A) PLACEMENT OF IMPELLA LEFT VENTRICULAR ASSIST DEVICE ATRIAL SEPTAL DEFECT (ASD) REPAIR (N/A)  He is hemodynamically stable on Milrinone 0.3 and Impella on P8. Co-ox is 62.4  Still has significant volume excess with weight up 17 lbs from preop and CXR showing pulmonary edema. Will benefit from continued diuresis for another day before considering Impella removal.  Postop acute blood  loss anemia: improved with transfusion.  Continue IS, sitting up.  Dr. [name] planning Impella removal Tuesday and has talked with family.   LOS: 4 days    Gaye Pollack 03/29/2017

## 2017-03-29 NOTE — Progress Notes (Signed)
Advanced Heart Failure Rounding Note  PCP:  Primary Cardiologist: Dr Nadyne Coombes  Subjective:    S/p emergent CABG x2 and Impella placement 03/25/17 for LM dissection  Extubated 6/29.  Off NE, epi and nitric. Remains on milrinone 0.3. LD Impella at P-8 Waveforms look good. Got 2u RBCs yesterday  Awake. Feels OK. Chest sore. Passing gas. No BM yet. Denies dyspnea. Diuresed well. Weight down 4 pounds. CXR + edema  CVP 9 PA 29/20 CO/CI 4.6/2.4  Impella at P-8 with flow 3.3L/Min    Objective:   Weight Range: 89.5 kg (197 lb 5 oz) Body mass index is 30.45 kg/m.   Vital Signs:   Temp:  [98.1 F (36.7 C)-99.9 F (37.7 C)] 98.2 F (36.8 C) (07/01 0816) Pulse Rate:  [78-86] 82 (07/01 0816) Resp:  [18-29] 18 (07/01 0816) BP: (91-136)/(64-86) 129/86 (07/01 0800) SpO2:  [90 %-100 %] 93 % (07/01 0816) Arterial Line BP: (89-176)/(49-80) 151/70 (07/01 0816) Weight:  [89.5 kg (197 lb 5 oz)] 89.5 kg (197 lb 5 oz) (07/01 0500) Last BM Date:  (PTA)  Weight change: Filed Weights   03/27/17 0500 03/28/17 0500 03/29/17 0500  Weight: 93.9 kg (207 lb 0.2 oz) 91.4 kg (201 lb 8 oz) 89.5 kg (197 lb 5 oz)    Intake/Output:   Intake/Output Summary (Last 24 hours) at 03/29/17 0905 Last data filed at 03/29/17 0800  Gross per 24 hour  Intake          3276.25 ml  Output             4215 ml  Net          -938.75 ml      Physical Exam   General: Awake. NAD. Speaks softs HEENT: normal Neck: supple. RIJ swan  Carotids 2+ bilat; no bruits. No lymphadenopathy or thryomegaly appreciated. Cor: Sternal dressings intact. RRR +impella hum  Chest tubes in place Lungs: clear decreased bases Abdomen: soft, nontender, nondistended. No hepatosplenomegaly. No bruits or masses. Hypoactive bowel sounds. Extremities: no cyanosis, clubbing, rash, edema. Foley in place Neuro: alert & oriented x 3, cranial nerves grossly intact. moves all 4 extremities w/o difficulty. Affect pleasant    Telemetry    NSR 80s. Personally reviewed   EKG    N/A  Labs    CBC  Recent Labs  03/28/17 2020 03/29/17 0400  WBC 9.9 10.2  HGB 10.2* 9.5*  HCT 29.4* 27.5*  MCV 83.3 83.8  PLT 71* 68*   Basic Metabolic Panel  Recent Labs  03/28/17 0304 03/29/17 0400  NA 131* 131*  K 3.7 3.6  CL 99* 98*  CO2 24 22  GLUCOSE 140* 213*  BUN 13 16  CREATININE 1.38* 1.17  CALCIUM 7.4* 7.7*  MG 1.8 2.3   Liver Function Tests  Recent Labs  03/28/17 0304 03/29/17 0400  AST 66* 49*  ALT 29 26  ALKPHOS 32* 49  BILITOT 2.5* 2.0*  PROT 5.0* 5.2*  ALBUMIN 3.1* 2.9*   No results for input(s): LIPASE, AMYLASE in the last 72 hours. Cardiac Enzymes No results for input(s): CKTOTAL, CKMB, CKMBINDEX, TROPONINI in the last 72 hours.  BNP: BNP (last 3 results) No results for input(s): BNP in the last 8760 hours.  ProBNP (last 3 results) No results for input(s): PROBNP in the last 8760 hours.   D-Dimer No results for input(s): DDIMER in the last 72 hours. Hemoglobin A1C No results for input(s): HGBA1C in the last 72 hours. Fasting Lipid Panel No results  for input(s): CHOL, HDL, LDLCALC, TRIG, CHOLHDL, LDLDIRECT in the last 72 hours. Thyroid Function Tests No results for input(s): TSH, T4TOTAL, T3FREE, THYROIDAB in the last 72 hours.  Invalid input(s): FREET3  Other results:   Imaging    No results found.   Medications:     Scheduled Medications: . acetaminophen  1,000 mg Oral Q6H   Or  . acetaminophen (TYLENOL) oral liquid 160 mg/5 mL  1,000 mg Per Tube Q6H  . aspirin EC  325 mg Oral Daily   Or  . aspirin  324 mg Per Tube Daily  . bisacodyl  10 mg Oral Daily   Or  . bisacodyl  10 mg Rectal Daily  . chlorhexidine  15 mL Mouth Rinse BID  . Chlorhexidine Gluconate Cloth  6 each Topical Daily  . docusate sodium  200 mg Oral Daily  . furosemide  40 mg Intravenous BID  . insulin aspart  0-24 Units Subcutaneous Q4H  . insulin detemir  24 Units Subcutaneous BID  .  levalbuterol  1.25 mg Nebulization Q6H  . mouth rinse  15 mL Mouth Rinse q12n4p  . metoCLOPramide (REGLAN) injection  10 mg Intravenous Q6H  . pantoprazole (PROTONIX) IV  40 mg Intravenous Q24H  . rosuvastatin  40 mg Oral q1800  . sodium chloride flush  10-40 mL Intracatheter Q12H  . sodium chloride flush  3 mL Intravenous Q12H  . spironolactone  12.5 mg Oral Daily    Infusions: . sodium chloride 20 mL/hr at 03/29/17 0800  . sodium chloride    . sodium chloride 20 mL/hr at 03/29/17 0800  . amiodarone 30 mg/hr (03/29/17 0800)  . cefUROXime (ZINACEF)  IV Stopped (03/28/17 2130)  . dextrose 5 % Impella 5.0 Purge solution Stopped (03/27/17 0842)  . EPINEPHrine 4 mg in dextrose 5% 250 mL infusion (16 mcg/mL) Stopped (03/28/17 1629)  . impella catheter heparin 25 unit/mL in dextrose 5%    . lactated ringers 20 mL/hr at 03/29/17 0800  . lactated ringers 20 mL/hr at 03/25/17 2115  . milrinone 0.3 mcg/kg/min (03/29/17 0800)  . nitroGLYCERIN Stopped (03/25/17 1645)  . norepinephrine (LEVOPHED) Adult infusion Stopped (03/28/17 1501)  . 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 Cardiogenic Shock in the setting of acute MI due to LM dissection - s/p emergent CABG 03/25/17 with insertion Impella LD 5.0  - Impella at P-8. Waveforms and output look good. He is getting 3.2 L support  (total CO is 4.6). Will turn down to P-6 (flow 2.6-2.7 L/min) and repeat swan numbers in one hour - Off Epi/NE. Remains on milrinone 0.3. Co-ox marginal at 57%. Will follow with Impella wean - He received lasix 40 IV this am. CVP 9. Diurese gently.  -  Echo 03/27/17 Very poor images even with Definity. Suspect EF ~45% - Will get to chair today. Try clear liquids.  - Back to OR tomorrow for Impella removal - No longer A-pacing Now with NSR at 85.   2. CAD - s/p CABG. No s/s ischemia - Continue ASA and statin  3. Acute respiratory failure -Extubated 6/29. Respiratory status improved.  -Persistent mild edema on CXR today (viewed  personally). Continue IV lasix.   4. Acute blood loss anemia - Transfused 2u RBCs on 6/30 today Hgb 9.5. Will follow  5. AKI - Due to shock. Creatinine back to 1.17 today  6. Hypokalemia/Hypomag - Improved - On spiro 12.5  Family updated.   CRITICAL CARE Performed by: Glori Bickers  Total critical care time: 35 minutes  Critical care time was exclusive of separately billable procedures and treating other patients.  Critical care was necessary to treat or prevent imminent or life-threatening deterioration.  Critical care was time spent personally by me (independent of midlevel providers or residents) on the following activities: development of treatment plan with patient and/or surrogate as well as nursing, discussions with consultants, evaluation of patient's response to treatment, examination of patient, obtaining history from patient or surrogate, ordering and performing treatments and interventions, ordering and review of laboratory studies, ordering and review of radiographic studies, pulse oximetry and re-evaluation of patient's condition.     Length of Stay: 4  Glori Bickers, MD  03/29/2017, 9:05 AM  Advanced Heart Failure Team Pager [phone] (M-F; 7a - 4p)  Please contact [facility] for night-coverage after hours (4p -7a ) and weekends on [email or web address]

## 2017-03-30 ENCOUNTER — Inpatient Hospital Stay (HOSPITAL_COMMUNITY): Payer: Medicare Other

## 2017-03-30 ENCOUNTER — Other Ambulatory Visit (HOSPITAL_COMMUNITY): Payer: Medicare Other

## 2017-03-30 LAB — COMPREHENSIVE METABOLIC PANEL
ALT: 27 U/L (ref 17–63)
AST: 34 U/L (ref 15–41)
Albumin: 2.9 g/dL — ABNORMAL LOW (ref 3.5–5.0)
Alkaline Phosphatase: 59 U/L (ref 38–126)
Anion gap: 9 (ref 5–15)
BUN: 22 mg/dL — ABNORMAL HIGH (ref 6–20)
CO2: 24 mmol/L (ref 22–32)
Calcium: 7.8 mg/dL — ABNORMAL LOW (ref 8.9–10.3)
Chloride: 100 mmol/L — ABNORMAL LOW (ref 101–111)
Creatinine, Ser: 1.11 mg/dL (ref 0.61–1.24)
GFR calc Af Amer: >60 mL/min (ref >60)
GFR calc non Af Amer: >60 mL/min (ref >60)
Glucose, Bld: 120 mg/dL — ABNORMAL HIGH (ref 65–99)
Potassium: 3.1 mmol/L — ABNORMAL LOW (ref 3.5–5.1)
Sodium: 133 mmol/L — ABNORMAL LOW (ref 135–145)
Total Bilirubin: 1.6 mg/dL — ABNORMAL HIGH (ref 0.3–1.2)
Total Protein: 5.4 g/dL — ABNORMAL LOW (ref 6.5–8.1)

## 2017-03-30 LAB — POCT I-STAT 3, ART BLOOD GAS (G3+)
Acid-Base Excess: 3 mmol/L — ABNORMAL HIGH (ref 0.0–2.0)
Bicarbonate: 24.7 mmol/L (ref 20.0–28.0)
O2 Saturation: 93 %
Patient temperature: 37.4
TCO2: 26 mmol/L (ref 0–100)
pCO2 arterial: 29.2 mmHg — ABNORMAL LOW (ref 32.0–48.0)
pH, Arterial: 7.537 — ABNORMAL HIGH (ref 7.350–7.450)
pO2, Arterial: 60 mmHg — ABNORMAL LOW (ref 83.0–108.0)

## 2017-03-30 LAB — GLUCOSE, CAPILLARY
Glucose-Capillary: 128 mg/dL — ABNORMAL HIGH (ref 65–99)
Glucose-Capillary: 81 mg/dL (ref 65–99)
Glucose-Capillary: 112 mg/dL — ABNORMAL HIGH (ref 65–99)
Glucose-Capillary: 102 mg/dL — ABNORMAL HIGH (ref 65–99)

## 2017-03-30 LAB — POCT ACTIVATED CLOTTING TIME
Activated Clotting Time: >1000 seconds
Activated Clotting Time: 136 seconds
Activated Clotting Time: 164 seconds
Activated Clotting Time: 164 seconds
Activated Clotting Time: 164 seconds
Activated Clotting Time: 164 seconds
Activated Clotting Time: 158 seconds
Activated Clotting Time: 158 seconds
Activated Clotting Time: 158 seconds

## 2017-03-30 LAB — COOXEMETRY PANEL
Carboxyhemoglobin: 1.4 % (ref 0.5–1.5)
Carboxyhemoglobin: 1.9 % — ABNORMAL HIGH (ref 0.5–1.5)
Methemoglobin: 1.3 % (ref 0.0–1.5)
Methemoglobin: 1 % (ref 0.0–1.5)
O2 Saturation: 46.1 %
O2 Saturation: 51.3 %
Total hemoglobin: 9.9 g/dL — ABNORMAL LOW (ref 12.0–16.0)
Total hemoglobin: 10.1 g/dL — ABNORMAL LOW (ref 12.0–16.0)

## 2017-03-30 LAB — POCT I-STAT, CHEM 8
BUN: 22 mg/dL — ABNORMAL HIGH (ref 6–20)
Calcium, Ion: 1.05 mmol/L — ABNORMAL LOW (ref 1.15–1.40)
Chloride: 99 mmol/L — ABNORMAL LOW (ref 101–111)
Creatinine, Ser: 1 mg/dL (ref 0.61–1.24)
Glucose, Bld: 107 mg/dL — ABNORMAL HIGH (ref 65–99)
HCT: 30 % — ABNORMAL LOW (ref 39.0–52.0)
Hemoglobin: 10.2 g/dL — ABNORMAL LOW (ref 13.0–17.0)
Potassium: 4.1 mmol/L (ref 3.5–5.1)
Sodium: 136 mmol/L (ref 135–145)
TCO2: 23 mmol/L (ref 0–100)

## 2017-03-30 LAB — CBC
HCT: 28.1 % — ABNORMAL LOW (ref 39.0–52.0)
Hemoglobin: 9.7 g/dL — ABNORMAL LOW (ref 13.0–17.0)
MCH: 28.9 pg (ref 26.0–34.0)
MCHC: 34.5 g/dL (ref 30.0–36.0)
MCV: 83.6 fL (ref 78.0–100.0)
Platelets: 98 10*3/uL — ABNORMAL LOW (ref 150–400)
RBC: 3.36 MIL/uL — ABNORMAL LOW (ref 4.22–5.81)
RDW: 16.1 % — ABNORMAL HIGH (ref 11.5–15.5)
WBC: 9.8 10*3/uL (ref 4.0–10.5)

## 2017-03-30 LAB — BLOOD GAS, ARTERIAL
Acid-Base Excess: 2.3 mmol/L — ABNORMAL HIGH (ref 0.0–2.0)
Bicarbonate: 25 mmol/L (ref 20.0–28.0)
Drawn by: 36277
O2 Content: 4 L/min
O2 Saturation: 93.6 %
Patient temperature: 98.6
pCO2 arterial: 30.5 mmHg — ABNORMAL LOW (ref 32.0–48.0)
pH, Arterial: 7.525 — ABNORMAL HIGH (ref 7.350–7.450)
pO2, Arterial: 63.5 mmHg — ABNORMAL LOW (ref 83.0–108.0)

## 2017-03-30 LAB — APTT: aPTT: 52 seconds — ABNORMAL HIGH (ref 24–36)

## 2017-03-30 LAB — PREPARE RBC (CROSSMATCH)

## 2017-03-30 LAB — LACTATE DEHYDROGENASE: LDH: 525 U/L — ABNORMAL HIGH (ref 98–192)

## 2017-03-30 LAB — MAGNESIUM: Magnesium: 2.2 mg/dL (ref 1.7–2.4)

## 2017-03-30 LAB — CALCIUM, IONIZED: Calcium, Ionized, Serum: 4.2 mg/dL — ABNORMAL LOW (ref 4.5–5.6)

## 2017-03-30 MED ORDER — LEVALBUTEROL HCL 1.25 MG/0.5ML IN NEBU
1.2500 mg | INHALATION_SOLUTION | Freq: Four times a day (QID) | RESPIRATORY_TRACT | Status: DC | PRN
  Administered 2017-03-30 – 2017-03-31 (×2): 1.25 mg via RESPIRATORY_TRACT
  Filled 2017-03-30 (×2): qty 0.5

## 2017-03-30 MED ORDER — POTASSIUM CHLORIDE CRYS ER 10 MEQ PO TBCR
40.0000 meq | EXTENDED_RELEASE_TABLET | Freq: Four times a day (QID) | ORAL | Status: AC
  Administered 2017-03-30 (×2): 40 meq via ORAL
  Filled 2017-03-30 (×2): qty 4

## 2017-03-30 MED ORDER — MILRINONE LACTATE IN DEXTROSE 20-5 MG/100ML-% IV SOLN
0.3750 ug/kg/min | INTRAVENOUS | Status: DC
  Administered 2017-03-30 – 2017-03-31 (×2): 0.375 ug/kg/min via INTRAVENOUS
  Administered 2017-04-01: 0.337 ug/kg/min via INTRAVENOUS
  Filled 2017-03-30 (×4): qty 100

## 2017-03-30 MED ORDER — DEXTROSE 5 % IV SOLN
1.0000 g | Freq: Three times a day (TID) | INTRAVENOUS | Status: DC
  Administered 2017-03-30 – 2017-04-06 (×21): 1 g via INTRAVENOUS
  Filled 2017-03-30 (×24): qty 1

## 2017-03-30 MED ORDER — ENOXAPARIN SODIUM 40 MG/0.4ML ~~LOC~~ SOLN
40.0000 mg | SUBCUTANEOUS | Status: DC
  Administered 2017-03-30: 40 mg via SUBCUTANEOUS
  Filled 2017-03-30: qty 0.4

## 2017-03-30 MED ORDER — VANCOMYCIN HCL IN DEXTROSE 1-5 GM/200ML-% IV SOLN
1000.0000 mg | Freq: Once | INTRAVENOUS | Status: AC
  Administered 2017-03-31: 1000 mg via INTRAVENOUS
  Filled 2017-03-30: qty 200

## 2017-03-30 MED ORDER — BISACODYL 10 MG RE SUPP
10.0000 mg | Freq: Once | RECTAL | Status: AC
  Administered 2017-03-30: 10 mg via RECTAL
  Filled 2017-03-30: qty 1

## 2017-03-30 MED ORDER — POTASSIUM CHLORIDE 10 MEQ/50ML IV SOLN
10.0000 meq | INTRAVENOUS | Status: AC
  Administered 2017-03-30 (×3): 10 meq via INTRAVENOUS
  Filled 2017-03-30 (×3): qty 50

## 2017-03-30 NOTE — Progress Notes (Signed)
Patient doing well and appreciate team support. Patient states he feels well and sat in chair yesterday.  I will continue to follow sidelines. Impella being weaned off and will be taken off tomorrow.  Luis [name]

## 2017-03-30 NOTE — Progress Notes (Signed)
Advanced Heart Failure Rounding Note  PCP:  Primary Cardiologist: Dr [name]  Subjective:    S/p emergent CABG x2 and Impella placement 03/25/17 for LM dissection  Extubated 6/29.  Off NE, epi and nitric. Remains on milrinone 0.3. LD Impella at P-8 Waveforms look good. Got 2u RBCs yesterday  Awake. Remains somewhat sore. + flatus. Weight shows up 3 lbs. I/O positive.   CVP 7-8 PA 29/11 (19) CO/CI 5.1/2.6  Impella at P-5 with flow 2.4 L/Min    Objective:   Weight Range: 200 lb 6.4 oz (90.9 kg) Body mass index is 30.92 kg/m.   Vital Signs:   Temp:  [98.2 F (36.8 C)-99.9 F (37.7 C)] 98.4 F (36.9 C) (07/02 0741) Pulse Rate:  [77-91] 84 (07/02 0700) Resp:  [16-27] 19 (07/02 0700) BP: (100-131)/(66-102) 116/71 (07/02 0700) SpO2:  [91 %-96 %] 96 % (07/02 0741) Arterial Line BP: (117-159)/(20-71) 131/57 (07/02 0700) Weight:  [200 lb 6.4 oz (90.9 kg)] 200 lb 6.4 oz (90.9 kg) (07/02 0400) Last BM Date:  (PTA)  Weight change: Filed Weights   03/28/17 0500 03/29/17 0500 03/30/17 0400  Weight: 201 lb 8 oz (91.4 kg) 197 lb 5 oz (89.5 kg) 200 lb 6.4 oz (90.9 kg)    Intake/Output:   Intake/Output Summary (Last 24 hours) at 03/30/17 0759 Last data filed at 03/30/17 0700  Gross per 24 hour  Intake           3306.5 ml  Output             2910 ml  Net            396.5 ml      Physical Exam   General: Awake. NAD.  HEENT: Normal Neck: Supple. RIJ swan. Carotids 2+ bilat; no bruits. No thyromegaly or nodule noted. Cor: PMI nondisplaced. RRR, + impella hum. + chest tubes.  Lungs: Diminished basilar sounds, though clear.  Abdomen: Soft, non-tender, non-distended, no HSM. No bruits or masses. Hypoactive BS  Extremities: No cyanosis, clubbing, rash, R and LLE no edema.  Neuro: Alert & orientedx3, cranial nerves grossly intact. moves all 4 extremities w/o difficulty. Affect pleasant  GU: + Foley  Telemetry   Personally reviewed, NSR 80s   EKG    N/A  Labs    CBC  Recent Labs  03/29/17 0400 03/30/17 0339  WBC 10.2 9.8  HGB 9.5* 9.7*  HCT 27.5* 28.1*  MCV 83.8 83.6  PLT 68* 98*   Basic Metabolic Panel  Recent Labs  03/29/17 0400 03/30/17 0339  NA 131* 133*  K 3.6 3.1*  CL 98* 100*  CO2 22 24  GLUCOSE 213* 120*  BUN 16 22*  CREATININE 1.17 1.11  CALCIUM 7.7* 7.8*  MG 2.3 2.2   Liver Function Tests  Recent Labs  03/29/17 0400 03/30/17 0339  AST 49* 34  ALT 26 27  ALKPHOS 49 59  BILITOT 2.0* 1.6*  PROT 5.2* 5.4*  ALBUMIN 2.9* 2.9*   No results for input(s): LIPASE, AMYLASE in the last 72 hours. Cardiac Enzymes No results for input(s): CKTOTAL, CKMB, CKMBINDEX, TROPONINI in the last 72 hours.  BNP: BNP (last 3 results) No results for input(s): BNP in the last 8760 hours.  ProBNP (last 3 results) No results for input(s): PROBNP in the last 8760 hours.   D-Dimer No results for input(s): DDIMER in the last 72 hours. Hemoglobin A1C No results for input(s): HGBA1C in the last 72 hours. Fasting Lipid Panel No results for  input(s): CHOL, HDL, LDLCALC, TRIG, CHOLHDL, LDLDIRECT in the last 72 hours. Thyroid Function Tests No results for input(s): TSH, T4TOTAL, T3FREE, THYROIDAB in the last 72 hours.  Invalid input(s): FREET3  Other results:   Imaging    Dg Chest Port 1 View  Result Date: 03/30/2017 CLINICAL DATA:  CABG. EXAM: PORTABLE CHEST 1 VIEW COMPARISON:  03/29/2017. FINDINGS: Bilateral chest tubes, Swan-Ganz catheter, mediastinal drainage catheters, Impella device in stable position. Prior CABG. Heart size stable. Improvement of bilateral interstitial prominence and bilateral pleural effusions suggesting improving CHF. No pneumothorax . IMPRESSION: 1. Lines and tubes including bilateral chest tubes in stable position. 2. Prior CABG. Improvement of bilateral interstitial prominence and bilateral pleural effusions consistent with improving CHF . Electronically Signed   By: Marcello Moores  Register   On: 03/30/2017  07:26     Medications:     Scheduled Medications: . acetaminophen  1,000 mg Oral Q6H   Or  . acetaminophen (TYLENOL) oral liquid 160 mg/5 mL  1,000 mg Per Tube Q6H  . aspirin EC  325 mg Oral Daily   Or  . aspirin  324 mg Per Tube Daily  . bisacodyl  10 mg Oral Daily   Or  . bisacodyl  10 mg Rectal Daily  . Chlorhexidine Gluconate Cloth  6 each Topical Daily  . docusate sodium  200 mg Oral Daily  . enoxaparin (LOVENOX) injection  40 mg Subcutaneous Q24H  . furosemide  40 mg Intravenous BID  . insulin aspart  0-24 Units Subcutaneous Q4H  . insulin detemir  24 Units Subcutaneous BID  . levalbuterol  1.25 mg Nebulization Q6H  . metoCLOPramide (REGLAN) injection  10 mg Intravenous Q6H  . pantoprazole (PROTONIX) IV  40 mg Intravenous Q24H  . rosuvastatin  40 mg Oral q1800  . sodium chloride flush  10-40 mL Intracatheter Q12H  . sodium chloride flush  3 mL Intravenous Q12H  . spironolactone  12.5 mg Oral Daily    Infusions: . sodium chloride 10 mL/hr at 03/30/17 0700  . sodium chloride    . sodium chloride Stopped (03/30/17 0700)  . amiodarone 30 mg/hr (03/30/17 0700)  . dextrose 5 % Impella 5.0 Purge solution Stopped (03/27/17 0842)  . impella catheter heparin 25 unit/mL in dextrose 5%    . lactated ringers 10 mL/hr at 03/30/17 0700  . lactated ringers 20 mL/hr at 03/25/17 2115  . milrinone 0.3 mcg/kg/min (03/30/17 0700)  . 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 Cardiogenic Shock in the setting of acute MI due to LM dissection - s/p emergent CABG 03/25/17 with insertion Impella LD 5.0  - Impella at P5. Waveform and output  looks good. Getting ~2.3-2.4 L of support  - Off Epi/NE. Coox at 46% this am on milrinone 0.3 mcg/kg/min. Stat repeat 51%.  Continue to follow with Impella wean.  - Echo 03/27/17 Very poor images even with Definity. Suspect EF ~45%. Will repeat Echo with very poor images on the first. Plan for dressing change in OR today.  - Up to chair as tolerated. - Plan for Impella removal tomorrow.  - No longer A-pacing. Now in NSR in 80s.   2. CAD -  s/p CABG. No s/s ischemia - Continue ASA and statin  3. Acute respiratory failure -Extubated 6/29. Respiratory status improved.  - CXR with mild improvement in edema. (Viewed personally).   4. Acute blood loss anemia - Transfused 2u RBCs on 6/30 today Hgb 9.5. Will follow  5. AKI - Resolved once shock improved.   6. Hypokalemia/Hypomag - Remains low. Supp today.  - On spiro 12.5  Length of Stay: 5  Annamaria Helling  03/30/2017, 7:59 AM  Advanced Heart Failure Team Pager [phone] (M-F; 7a - 4p)  Please contact [facility] for night-coverage after hours (4p -7a ) and weekends on [email or web address]  Agree.  Remains tenuous. Oxygenation and CXR improved with diuresis but still a bit marginal. Cardiac output on Swan looks good but Co-ox is marginal as well. Now back in NSR on IV amio. Impella waveforms stable.   On exam Weak  RIJ swan Cor dressing intact. RRR Impella hum Lungs decreased BS but clear Ab soft NT hypoactive BS Ext warm. No edema  Will continue to diurese gently. Wean Impella to P-3 throughout the day. Follow Swan numbers and co-ox. Continue milrinone. Can increase as needed. To OR in am for Impella removal. Will attempt bedside echo later today.   D/w Drs. [name] and [place].   CRITICAL CARE Performed by: Glori Bickers  Total critical care time: 35 minutes  Critical care time was exclusive of separately billable procedures and treating other patients.  Critical care was necessary to treat or prevent imminent  or life-threatening deterioration.  Critical care was time spent personally by me (independent of midlevel providers or residents) on the following activities: development of treatment plan with patient and/or surrogate as well as nursing, discussions with consultants, evaluation of patient's response to treatment, examination of patient, obtaining history from patient or surrogate, ordering and performing treatments and interventions, ordering and review of laboratory studies, ordering and review of radiographic studies, pulse oximetry and re-evaluation of patient's condition.  Glori Bickers, MD  10:07 AM

## 2017-03-30 NOTE — Progress Notes (Signed)
5 Days Post-Op Procedure(s) (LRB): CORONARY ARTERY BYPASS GRAFTING (CABG) x 2 using left internal mammary artery and right greater saphenous leg vein using endosciope. (N/A) TRANSESOPHAGEAL ECHOCARDIOGRAM (TEE) (N/A) PLACEMENT OF IMPELLA LEFT VENTRICULAR ASSIST DEVICE ATRIAL SEPTAL DEFECT (ASD) REPAIR (N/A) Subjective: Stable during VAD wean- now P-4 Impella dressing changed today with sterile technique OOB to chair Plan removal of Impella in AM- procedure d/w patient and family Objective: Vital signs in last 24 hours: Temp:  [98.4 F (36.9 C)-99.9 F (37.7 C)] 99.1 F (37.3 C) (07/02 1500) Pulse Rate:  [77-91] 87 (07/02 1500) Cardiac Rhythm: Normal sinus rhythm (07/02 1200) Resp:  [16-27] 23 (07/02 1500) BP: (100-131)/(61-102) 110/69 (07/02 1500) SpO2:  [91 %-97 %] 97 % (07/02 1500) Arterial Line BP: (117-159)/(20-72) 118/59 (07/02 1500) Weight:  [200 lb 6.4 oz (90.9 kg)] 200 lb 6.4 oz (90.9 kg) (07/02 0400)  Hemodynamic parameters for last 24 hours: PAP: (24-41)/(11-28) 28/17 CVP:  [9 mmHg-12 mmHg] 12 mmHg CO:  [4.8 L/min-7.1 L/min] 7.1 L/min CI:  [2.5 L/min/m2-4.1 L/min/m2] 4.1 L/min/m2  Intake/Output from previous day: 07/01 0701 - 07/02 0700 In: 3323.2 [P.O.:600; I.V.:2193.2; IV Piggyback:200] Out: 2910 [Urine:2870; Chest Tube:40] Intake/Output this shift: Total I/O In: 999.6 [P.O.:297; I.V.:384.8; Other:117.8; IV Piggyback:200] Out: 0973 [Urine:1520; [place]; Chest Tube:140]       Exam    General- alert and comfortable   Lungs- clear without rales, wheezes   Cor- regular rate and rhythm, no murmur , gallop   Abdomen- soft, non-tender   Extremities - warm, non-tender, minimal edema   Neuro- oriented, appropriate, no focal weakness   Lab Results:  Recent Labs  03/29/17 0400 03/30/17 0339  WBC 10.2 9.8  HGB 9.5* 9.7*  HCT 27.5* 28.1*  PLT 68* 98*   BMET:  Recent Labs  03/29/17 0400 03/30/17 0339  NA 131* 133*  K 3.6 3.1*  CL 98* 100*  CO2 22 24   GLUCOSE 213* 120*  BUN 16 22*  CREATININE 1.17 1.11  CALCIUM 7.7* 7.8*    PT/INR: No results for input(s): LABPROT, INR in the last 72 hours. ABG    Component Value Date/Time   PHART 7.525 (H) 03/30/2017 0408   HCO3 25.0 03/30/2017 0408   TCO2 25 03/29/2017 1709   ACIDBASEDEF 1.0 03/27/2017 0524   O2SAT 51.3 03/30/2017 0803   CBG (last 3)   Recent Labs  03/30/17 0351 03/30/17 0742 03/30/17 1124  GLUCAP 128* 81 112*    Assessment/Plan: S/P Procedure(s) (LRB): CORONARY ARTERY BYPASS GRAFTING (CABG) x 2 using left internal mammary artery and right greater saphenous leg vein using endosciope. (N/A) TRANSESOPHAGEAL ECHOCARDIOGRAM (TEE) (N/A) PLACEMENT OF IMPELLA LEFT VENTRICULAR ASSIST DEVICE ATRIAL SEPTAL DEFECT (ASD) REPAIR (N/A) Continue to optimize in order to remove Impella in OR tomorrow am   LOS: 5 days    Luis [name] 03/30/2017

## 2017-03-30 NOTE — Progress Notes (Signed)
      [address]       [place] [address]             [phone]      POD # 5 CABG, ASD repair, Impella  No complaints  BP 104/85   Pulse 88   Temp 99.7 F (37.6 C)   Resp (!) 24   Ht 5' 7.5" (1.715 m)   Wt 200 lb 6.4 oz (90.9 kg)   SpO2 96%   BMI 30.92 kg/m    Intake/Output Summary (Last 24 hours) at 03/30/17 1804 Last data filed at 03/30/17 1800  Gross per 24 hour  Intake           2547.8 ml  Output             2062 ml  Net            485.8 ml   For impella removal tomorrow  Remo Lipps C. Roxan Hockey, MD [facility] [phone]

## 2017-03-31 ENCOUNTER — Inpatient Hospital Stay (HOSPITAL_COMMUNITY): Payer: Medicare Other | Admitting: Certified Registered"

## 2017-03-31 ENCOUNTER — Inpatient Hospital Stay (HOSPITAL_COMMUNITY): Payer: Medicare Other

## 2017-03-31 ENCOUNTER — Encounter (HOSPITAL_COMMUNITY)
Admission: AD | Disposition: A | Discharge status: Home Health | Payer: Self-pay | Source: Ambulatory Visit | Attending: Cardiothoracic Surgery

## 2017-03-31 DIAGNOSIS — I2542 Coronary artery dissection: Secondary | ICD-10-CM

## 2017-03-31 HISTORY — PX: REMOVAL OF IMPELLA LEFT VENTRICULAR ASSIST DEVICE: SHX6556

## 2017-03-31 HISTORY — PX: TEE WITHOUT CARDIOVERSION: SHX5443

## 2017-03-31 LAB — GLUCOSE, CAPILLARY
Glucose-Capillary: 103 mg/dL — ABNORMAL HIGH (ref 65–99)
Glucose-Capillary: 150 mg/dL — ABNORMAL HIGH (ref 65–99)
Glucose-Capillary: 73 mg/dL (ref 65–99)
Glucose-Capillary: 82 mg/dL (ref 65–99)
Glucose-Capillary: 87 mg/dL (ref 65–99)
Glucose-Capillary: 96 mg/dL (ref 65–99)

## 2017-03-31 LAB — CBC
HCT: 28.7 % — ABNORMAL LOW (ref 39.0–52.0)
HCT: 28.7 % — ABNORMAL LOW (ref 39.0–52.0)
Hemoglobin: 9.6 g/dL — ABNORMAL LOW (ref 13.0–17.0)
Hemoglobin: 9.5 g/dL — ABNORMAL LOW (ref 13.0–17.0)
MCH: 28.5 pg (ref 26.0–34.0)
MCH: 28.8 pg (ref 26.0–34.0)
MCHC: 33.4 g/dL (ref 30.0–36.0)
MCHC: 33.1 g/dL (ref 30.0–36.0)
MCV: 85.2 fL (ref 78.0–100.0)
MCV: 87 fL (ref 78.0–100.0)
Platelets: 105 10*3/uL — ABNORMAL LOW (ref 150–400)
Platelets: 99 10*3/uL — ABNORMAL LOW (ref 150–400)
RBC: 3.37 MIL/uL — ABNORMAL LOW (ref 4.22–5.81)
RBC: 3.3 MIL/uL — ABNORMAL LOW (ref 4.22–5.81)
RDW: 16.1 % — ABNORMAL HIGH (ref 11.5–15.5)
RDW: 16.3 % — ABNORMAL HIGH (ref 11.5–15.5)
WBC: 10.5 10*3/uL (ref 4.0–10.5)
WBC: 14.2 10*3/uL — ABNORMAL HIGH (ref 4.0–10.5)

## 2017-03-31 LAB — POCT I-STAT 3, ART BLOOD GAS (G3+)
Acid-Base Excess: 4 mmol/L — ABNORMAL HIGH (ref 0.0–2.0)
Acid-base deficit: 6 mmol/L — ABNORMAL HIGH (ref 0.0–2.0)
Acid-base deficit: 1 mmol/L (ref 0.0–2.0)
Bicarbonate: 26 mmol/L (ref 20.0–28.0)
Bicarbonate: 20.1 mmol/L (ref 20.0–28.0)
Bicarbonate: 23.4 mmol/L (ref 20.0–28.0)
Bicarbonate: 22.3 mmol/L (ref 20.0–28.0)
Bicarbonate: 20.9 mmol/L (ref 20.0–28.0)
O2 Saturation: 95 %
O2 Saturation: 92 %
O2 Saturation: 97 %
O2 Saturation: 93 %
O2 Saturation: 91 %
Patient temperature: 37.4
Patient temperature: 36.3
Patient temperature: 36.2
Patient temperature: 38
Patient temperature: 38
TCO2: 27 mmol/L (ref 0–100)
TCO2: 21 mmol/L (ref 0–100)
TCO2: 24 mmol/L (ref 0–100)
TCO2: 23 mmol/L (ref 0–100)
TCO2: 22 mmol/L (ref 0–100)
pCO2 arterial: 29.1 mmHg — ABNORMAL LOW (ref 32.0–48.0)
pCO2 arterial: 38.8 mmHg (ref 32.0–48.0)
pCO2 arterial: 29.6 mmHg — ABNORMAL LOW (ref 32.0–48.0)
pCO2 arterial: 28.4 mmHg — ABNORMAL LOW (ref 32.0–48.0)
pCO2 arterial: 26.6 mmHg — ABNORMAL LOW (ref 32.0–48.0)
pH, Arterial: 7.56 — ABNORMAL HIGH (ref 7.350–7.450)
pH, Arterial: 7.32 — ABNORMAL LOW (ref 7.350–7.450)
pH, Arterial: 7.502 — ABNORMAL HIGH (ref 7.350–7.450)
pH, Arterial: 7.507 — ABNORMAL HIGH (ref 7.350–7.450)
pH, Arterial: 7.506 — ABNORMAL HIGH (ref 7.350–7.450)
pO2, Arterial: 63 mmHg — ABNORMAL LOW (ref 83.0–108.0)
pO2, Arterial: 66 mmHg — ABNORMAL LOW (ref 83.0–108.0)
pO2, Arterial: 75 mmHg — ABNORMAL LOW (ref 83.0–108.0)
pO2, Arterial: 62 mmHg — ABNORMAL LOW (ref 83.0–108.0)
pO2, Arterial: 57 mmHg — ABNORMAL LOW (ref 83.0–108.0)

## 2017-03-31 LAB — POCT I-STAT EG7
Acid-base deficit: 3 mmol/L — ABNORMAL HIGH (ref 0.0–2.0)
Bicarbonate: 22.7 mmol/L (ref 20.0–28.0)
Calcium, Ion: 1.09 mmol/L — ABNORMAL LOW (ref 1.15–1.40)
HCT: 27 % — ABNORMAL LOW (ref 39.0–52.0)
Hemoglobin: 9.2 g/dL — ABNORMAL LOW (ref 13.0–17.0)
O2 Saturation: 72 %
Patient temperature: 36.9
Potassium: 4.4 mmol/L (ref 3.5–5.1)
Sodium: 136 mmol/L (ref 135–145)
TCO2: 24 mmol/L (ref 0–100)
pCO2, Ven: 39.9 mmHg — ABNORMAL LOW (ref 44.0–60.0)
pH, Ven: 7.362 (ref 7.250–7.430)
pO2, Ven: 39 mmHg (ref 32.0–45.0)

## 2017-03-31 LAB — POCT I-STAT, CHEM 8
BUN: 22 mg/dL — ABNORMAL HIGH (ref 6–20)
Calcium, Ion: 1.06 mmol/L — ABNORMAL LOW (ref 1.15–1.40)
Chloride: 100 mmol/L — ABNORMAL LOW (ref 101–111)
Creatinine, Ser: 1 mg/dL (ref 0.61–1.24)
Glucose, Bld: 83 mg/dL (ref 65–99)
HCT: 25 % — ABNORMAL LOW (ref 39.0–52.0)
Hemoglobin: 8.5 g/dL — ABNORMAL LOW (ref 13.0–17.0)
Potassium: 3.9 mmol/L (ref 3.5–5.1)
Sodium: 136 mmol/L (ref 135–145)
TCO2: 23 mmol/L (ref 0–100)

## 2017-03-31 LAB — APTT: aPTT: 48 seconds — ABNORMAL HIGH (ref 24–36)

## 2017-03-31 LAB — ECHO TEE
LV dias vol: 60 mL — AB (ref 62–150)
LV sys vol: 37 mL
Simpson's disk: 39
Stroke v: 24 ml

## 2017-03-31 LAB — POCT ACTIVATED CLOTTING TIME
Activated Clotting Time: 158 seconds
Activated Clotting Time: 158 seconds
Activated Clotting Time: 158 seconds
Activated Clotting Time: 164 seconds

## 2017-03-31 LAB — COOXEMETRY PANEL
Carboxyhemoglobin: 1.9 % — ABNORMAL HIGH (ref 0.5–1.5)
Carboxyhemoglobin: 1.2 % (ref 0.5–1.5)
Carboxyhemoglobin: 1.1 % (ref 0.5–1.5)
Methemoglobin: 0.9 % (ref 0.0–1.5)
Methemoglobin: 0.9 % (ref 0.0–1.5)
Methemoglobin: 1.2 % (ref 0.0–1.5)
O2 Saturation: 54.2 %
O2 Saturation: 45.5 %
O2 Saturation: 56.8 %
Total hemoglobin: 10.1 g/dL — ABNORMAL LOW (ref 12.0–16.0)
Total hemoglobin: 9.6 g/dL — ABNORMAL LOW (ref 12.0–16.0)
Total hemoglobin: 9.2 g/dL — ABNORMAL LOW (ref 12.0–16.0)

## 2017-03-31 LAB — MAGNESIUM: Magnesium: 2.3 mg/dL (ref 1.7–2.4)

## 2017-03-31 LAB — CALCIUM, IONIZED: Calcium, Ionized, Serum: 4.5 mg/dL (ref 4.5–5.6)

## 2017-03-31 LAB — LACTATE DEHYDROGENASE: LDH: 567 U/L — ABNORMAL HIGH (ref 98–192)

## 2017-03-31 LAB — HEMOGLOBIN AND HEMATOCRIT, BLOOD
HCT: 26.6 % — ABNORMAL LOW (ref 39.0–52.0)
Hemoglobin: 8.8 g/dL — ABNORMAL LOW (ref 13.0–17.0)

## 2017-03-31 SURGERY — REMOVAL, CARDIAC ASSIST DEVICE, IMPELLA
Anesthesia: General | Site: Chest

## 2017-03-31 MED ORDER — ROCURONIUM BROMIDE 100 MG/10ML IV SOLN
INTRAVENOUS | Status: DC | PRN
  Administered 2017-03-31: 50 mg via INTRAVENOUS
  Administered 2017-03-31: 20 mg via INTRAVENOUS
  Administered 2017-03-31: 30 mg via INTRAVENOUS

## 2017-03-31 MED ORDER — MIDAZOLAM HCL 5 MG/5ML IJ SOLN
INTRAMUSCULAR | Status: DC | PRN
  Administered 2017-03-31 (×2): 2 mg via INTRAVENOUS

## 2017-03-31 MED ORDER — LACTATED RINGERS IV SOLN
INTRAVENOUS | Status: DC | PRN
  Administered 2017-03-31 (×2): via INTRAVENOUS

## 2017-03-31 MED ORDER — PHENYLEPHRINE HCL 10 MG/ML IJ SOLN
INTRAMUSCULAR | Status: DC | PRN
  Administered 2017-03-31: 25 ug/min via INTRAVENOUS

## 2017-03-31 MED ORDER — SUGAMMADEX SODIUM 200 MG/2ML IV SOLN
INTRAVENOUS | Status: DC | PRN
  Administered 2017-03-31: 200 mg via INTRAVENOUS

## 2017-03-31 MED ORDER — DEXMEDETOMIDINE HCL IN NACL 200 MCG/50ML IV SOLN
INTRAVENOUS | Status: DC | PRN
  Administered 2017-03-31: 0.7 ug/kg/h via INTRAVENOUS

## 2017-03-31 MED ORDER — FENTANYL CITRATE (PF) 250 MCG/5ML IJ SOLN
INTRAMUSCULAR | Status: AC
  Filled 2017-03-31: qty 5

## 2017-03-31 MED ORDER — SUCCINYLCHOLINE CHLORIDE 20 MG/ML IJ SOLN
INTRAMUSCULAR | Status: DC | PRN
  Administered 2017-03-31: 100 mg via INTRAVENOUS

## 2017-03-31 MED ORDER — HEPARIN SOD (PORK) LOCK FLUSH 100 UNIT/ML IV SOLN
INTRAVENOUS | Status: AC
  Filled 2017-03-31: qty 15

## 2017-03-31 MED ORDER — LIDOCAINE HCL (CARDIAC) 20 MG/ML IV SOLN
INTRAVENOUS | Status: DC | PRN
  Administered 2017-03-31: 50 mg via INTRAVENOUS

## 2017-03-31 MED ORDER — EPINEPHRINE PF 1 MG/ML IJ SOLN
0.5000 ug/min | INTRAVENOUS | Status: DC
  Administered 2017-03-31: 2 ug/min via INTRAVENOUS
  Filled 2017-03-31 (×2): qty 4

## 2017-03-31 MED ORDER — FUROSEMIDE 10 MG/ML IJ SOLN
40.0000 mg | Freq: Every day | INTRAMUSCULAR | Status: AC
  Administered 2017-04-02: 40 mg via INTRAVENOUS
  Filled 2017-03-31 (×2): qty 4

## 2017-03-31 MED ORDER — ETOMIDATE 2 MG/ML IV SOLN
INTRAVENOUS | Status: DC | PRN
  Administered 2017-03-31: 14 mg via INTRAVENOUS

## 2017-03-31 MED ORDER — FENTANYL CITRATE (PF) 100 MCG/2ML IJ SOLN
INTRAMUSCULAR | Status: DC | PRN
Start: 2017-03-31 — End: 2017-03-31
  Administered 2017-03-31: 100 ug via INTRAVENOUS
  Administered 2017-03-31: 50 ug via INTRAVENOUS

## 2017-03-31 MED ORDER — SODIUM BICARBONATE 8.4 % IV SOLN: 25.0000 meq | Freq: Once | INTRAVENOUS | Status: DC

## 2017-03-31 MED ORDER — VANCOMYCIN HCL 1000 MG IV SOLR
INTRAVENOUS | Status: AC
  Administered 2017-03-31: 1000 mL
  Filled 2017-03-31: qty 1000

## 2017-03-31 MED ORDER — SODIUM CHLORIDE 0.9 % IV SOLN
0.4000 ug/kg/h | INTRAVENOUS | Status: DC
  Filled 2017-03-31: qty 2

## 2017-03-31 MED ORDER — NOREPINEPHRINE BITARTRATE 1 MG/ML IV SOLN
0.0000 ug/min | INTRAVENOUS | Status: DC
  Filled 2017-03-31 (×2): qty 4

## 2017-03-31 MED ORDER — EPINEPHRINE PF 1 MG/ML IJ SOLN
INTRAVENOUS | Status: DC | PRN
  Administered 2017-03-31 (×2): 2 ug/min via INTRAVENOUS

## 2017-03-31 MED ORDER — HEMOSTATIC AGENTS (NO CHARGE) OPTIME
TOPICAL | Status: DC | PRN
  Administered 2017-03-31 (×2): 1 via TOPICAL

## 2017-03-31 MED ORDER — PROPOFOL 10 MG/ML IV BOLUS
INTRAVENOUS | Status: DC | PRN
  Administered 2017-03-31: 50 mg via INTRAVENOUS

## 2017-03-31 MED ORDER — DEXTROSE 5 % IV SOLN
2.0000 ug/min | INTRAVENOUS | Status: DC
  Administered 2017-03-31: 2 ug/min via INTRAVENOUS
  Filled 2017-03-31 (×3): qty 4

## 2017-03-31 MED ORDER — INSULIN DETEMIR 100 UNIT/ML ~~LOC~~ SOLN
24.0000 [IU] | Freq: Two times a day (BID) | SUBCUTANEOUS | Status: DC
  Administered 2017-04-01 – 2017-04-03 (×4): 24 [IU] via SUBCUTANEOUS
  Filled 2017-03-31 (×7): qty 0.24

## 2017-03-31 MED ORDER — MIDAZOLAM HCL 2 MG/2ML IJ SOLN
INTRAMUSCULAR | Status: AC
  Filled 2017-03-31: qty 2

## 2017-03-31 MED ORDER — 0.9 % SODIUM CHLORIDE (POUR BTL) OPTIME
TOPICAL | Status: DC | PRN
  Administered 2017-03-31: 3000 mL

## 2017-03-31 MED ORDER — HEPARIN SODIUM (PORCINE) 1000 UNIT/ML IJ SOLN
INTRAMUSCULAR | Status: DC | PRN
  Administered 2017-03-31: 2000 [IU] via INTRAVENOUS

## 2017-03-31 MED ORDER — HEPARIN SOD (PORK) LOCK FLUSH 100 UNIT/ML IV SOLN
INTRAVENOUS | Status: DC | PRN
  Administered 2017-03-31: 1500 [IU] via INTRAVENOUS

## 2017-03-31 MED ORDER — VANCOMYCIN HCL IN DEXTROSE 1-5 GM/200ML-% IV SOLN
1000.0000 mg | INTRAVENOUS | Status: DC
  Filled 2017-03-31 (×2): qty 200

## 2017-03-31 MED ORDER — PROPOFOL 10 MG/ML IV BOLUS
INTRAVENOUS | Status: AC
  Filled 2017-03-31: qty 20

## 2017-03-31 MED ORDER — PHENYLEPHRINE HCL 10 MG/ML IJ SOLN
0.0000 ug/min | INTRAMUSCULAR | Status: DC
  Filled 2017-03-31 (×2): qty 1

## 2017-03-31 MED ORDER — FUROSEMIDE 10 MG/ML IJ SOLN
INTRAMUSCULAR | Status: DC | PRN
  Administered 2017-03-31: 20 mg via INTRAMUSCULAR

## 2017-03-31 SURGICAL SUPPLY — 95 items
ADAPTER CARDIO PERF ANTE/RETRO (ADAPTER) ×2 IMPLANT
ADPR PRFSN 84XANTGRD RTRGD (ADAPTER)
ATTRACTOMAT 16X20 MAGNETIC DRP (DRAPES) ×2 IMPLANT
BAG DECANTER FOR FLEXI CONT (MISCELLANEOUS) ×4 IMPLANT
BANDAGE ACE 4X5 VEL STRL LF (GAUZE/BANDAGES/DRESSINGS) ×2 IMPLANT
BANDAGE ACE 6X5 VEL STRL LF (GAUZE/BANDAGES/DRESSINGS) ×2 IMPLANT
BASKET HEART  (ORDER IN 25'S) (MISCELLANEOUS)
BASKET HEART (ORDER IN 25'S) (MISCELLANEOUS)
BASKET HEART (ORDER IN 25S) (MISCELLANEOUS) ×2 IMPLANT
BLADE CLIPPER SURG (BLADE) IMPLANT
BLADE STERNUM SYSTEM 6 (BLADE) ×2 IMPLANT
BLADE SURG 12 STRL SS (BLADE) ×2 IMPLANT
BNDG GAUZE ELAST 4 BULKY (GAUZE/BANDAGES/DRESSINGS) ×2 IMPLANT
CANISTER SUCT 3000ML PPV (MISCELLANEOUS) ×4 IMPLANT
CANNULA GUNDRY RCSP 15FR (MISCELLANEOUS) ×2 IMPLANT
CATH CPB KIT VANTRIGT (MISCELLANEOUS) ×2 IMPLANT
CATH ROBINSON RED A/P 18FR (CATHETERS) ×6 IMPLANT
CATH THORACIC 36FR RT ANG (CATHETERS) ×2 IMPLANT
COVER SURGICAL LIGHT HANDLE (MISCELLANEOUS) ×2 IMPLANT
CRADLE DONUT ADULT HEAD (MISCELLANEOUS) ×4 IMPLANT
DRAIN CHANNEL 32F RND 10.7 FF (WOUND CARE) ×2 IMPLANT
DRAPE CARDIOVASCULAR INCISE (DRAPES)
DRAPE SLUSH/WARMER DISC (DRAPES) ×4 IMPLANT
DRAPE SRG 135X102X78XABS (DRAPES) ×2 IMPLANT
DRSG AQUACEL AG ADV 3.5X14 (GAUZE/BANDAGES/DRESSINGS) ×2 IMPLANT
DRSG TEGADERM 2-3/8X2-3/4 SM (GAUZE/BANDAGES/DRESSINGS) ×2 IMPLANT
ELECT BLADE 4.0 EZ CLEAN MEGAD (MISCELLANEOUS) ×4
ELECT BLADE 6.5 EXT (BLADE) ×2 IMPLANT
ELECT CAUTERY BLADE 6.4 (BLADE) ×2 IMPLANT
ELECT REM PT RETURN 9FT ADLT (ELECTROSURGICAL) ×8
ELECTRODE BLDE 4.0 EZ CLN MEGD (MISCELLANEOUS) ×2 IMPLANT
ELECTRODE REM PT RTRN 9FT ADLT (ELECTROSURGICAL) ×4 IMPLANT
FELT TEFLON 1X6 (MISCELLANEOUS) ×2 IMPLANT
GAUZE SPONGE 4X4 12PLY STRL (GAUZE/BANDAGES/DRESSINGS) ×6 IMPLANT
GAUZE XEROFORM 5X9 LF (GAUZE/BANDAGES/DRESSINGS) ×2 IMPLANT
GLOVE BIO SURGEON STRL SZ7.5 (GLOVE) ×8 IMPLANT
GOWN STRL REUS W/ TWL LRG LVL3 (GOWN DISPOSABLE) ×8 IMPLANT
GOWN STRL REUS W/TWL LRG LVL3 (GOWN DISPOSABLE) ×16
HANDLE STAPLE ENDO GIA SHORT (STAPLE) ×2
HEMOSTAT POWDER SURGIFOAM 1G (HEMOSTASIS) ×6 IMPLANT
HEMOSTAT SURGICEL 2X14 (HEMOSTASIS) ×4 IMPLANT
INSERT FOGARTY XLG (MISCELLANEOUS) IMPLANT
KIT BASIN OR (CUSTOM PROCEDURE TRAY) ×4 IMPLANT
KIT ROOM TURNOVER OR (KITS) ×4 IMPLANT
KIT SUCTION CATH 14FR (SUCTIONS) ×2 IMPLANT
KIT VASOVIEW HEMOPRO VH 3000 (KITS) ×2 IMPLANT
LEAD PACING MYOCARDI (MISCELLANEOUS) ×2 IMPLANT
MARKER GRAFT CORONARY BYPASS (MISCELLANEOUS) ×6 IMPLANT
NS IRRIG 1000ML POUR BTL (IV SOLUTION) ×16 IMPLANT
PACK OPEN HEART (CUSTOM PROCEDURE TRAY) ×4 IMPLANT
PAD ARMBOARD 7.5X6 YLW CONV (MISCELLANEOUS) ×8 IMPLANT
PAD ELECT DEFIB RADIOL ZOLL (MISCELLANEOUS) ×4 IMPLANT
PENCIL BUTTON HOLSTER BLD 10FT (ELECTRODE) ×2 IMPLANT
PUNCH AORTIC ROTATE 4.0MM (MISCELLANEOUS) IMPLANT
PUNCH AORTIC ROTATE 4.5MM 8IN (MISCELLANEOUS) IMPLANT
PUNCH AORTIC ROTATE 5MM 8IN (MISCELLANEOUS) IMPLANT
RELOAD ENDO GIA 30 3.5 (STAPLE) ×2 IMPLANT
STAPLER ENDO GIA 12 SHRT THIN (STAPLE) IMPLANT
STAPLER ENDO GIA 12MM SHORT (STAPLE) ×2 IMPLANT
SURGIFLO W/THROMBIN 8M KIT (HEMOSTASIS) ×4 IMPLANT
SUT BONE WAX W31G (SUTURE) ×2 IMPLANT
SUT ETHILON 3 0 FSL (SUTURE) ×2 IMPLANT
SUT MNCRL AB 4-0 PS2 18 (SUTURE) IMPLANT
SUT PROLENE 3 0 SH DA (SUTURE) IMPLANT
SUT PROLENE 3 0 SH1 36 (SUTURE) IMPLANT
SUT PROLENE 4 0 RB 1 (SUTURE)
SUT PROLENE 4 0 SH DA (SUTURE) ×2 IMPLANT
SUT PROLENE 4-0 RB1 .5 CRCL 36 (SUTURE) ×2 IMPLANT
SUT PROLENE 5 0 C 1 36 (SUTURE) ×4 IMPLANT
SUT PROLENE 6 0 C 1 30 (SUTURE) IMPLANT
SUT PROLENE 6 0 CC (SUTURE) ×6 IMPLANT
SUT PROLENE 8 0 BV175 6 (SUTURE) IMPLANT
SUT PROLENE BLUE 7 0 (SUTURE) ×2 IMPLANT
SUT SILK  1 MH (SUTURE)
SUT SILK 1 MH (SUTURE) IMPLANT
SUT SILK 2 0 SH CR/8 (SUTURE) IMPLANT
SUT SILK 3 0 SH CR/8 (SUTURE) IMPLANT
SUT STEEL 6MS V (SUTURE) ×4 IMPLANT
SUT STEEL SZ 6 DBL 3X14 BALL (SUTURE) ×2 IMPLANT
SUT VIC AB 1 CTX 36 (SUTURE) ×4
SUT VIC AB 1 CTX36XBRD ANBCTR (SUTURE) ×4 IMPLANT
SUT VIC AB 2-0 CT1 27 (SUTURE)
SUT VIC AB 2-0 CT1 TAPERPNT 27 (SUTURE) IMPLANT
SUT VIC AB 2-0 CTX 27 (SUTURE) IMPLANT
SUT VIC AB 3-0 SH 18 (SUTURE) ×2 IMPLANT
SUT VIC AB 3-0 SH 8-18 (SUTURE) ×2 IMPLANT
SUT VIC AB 3-0 X1 27 (SUTURE) IMPLANT
SUTURE E-PAK OPEN HEART (SUTURE) ×4 IMPLANT
SYSTEM SAHARA CHEST DRAIN ATS (WOUND CARE) ×2 IMPLANT
TOWEL OR 17X24 6PK STRL BLUE (TOWEL DISPOSABLE) ×4 IMPLANT
TOWEL OR 17X26 10 PK STRL BLUE (TOWEL DISPOSABLE) ×6 IMPLANT
TRAY FOLEY SILVER 16FR TEMP (SET/KITS/TRAYS/PACK) ×2 IMPLANT
TUBING INSUFFLATION (TUBING) ×2 IMPLANT
UNDERPAD 30X30 (UNDERPADS AND DIAPERS) ×2 IMPLANT
WATER STERILE IRR 1000ML POUR (IV SOLUTION) ×4 IMPLANT

## 2017-03-31 NOTE — Transfer of Care (Signed)
Immediate Anesthesia Transfer of Care Note  Patient: Administrator, arts  Procedure(s) Performed: Procedure(s): REMOVAL OF IMPELLA LEFT VENTRICULAR ASSIST DEVICE (N/A) TRANSESOPHAGEAL ECHOCARDIOGRAM (TEE) (N/A)  Patient Location: SICU  Anesthesia Type:General  Level of Consciousness: sedated and Patient remains intubated per anesthesia plan  Airway & Oxygen Therapy: Patient remains intubated per anesthesia plan and Patient placed on Ventilator (see vital sign flow sheet for setting)  Post-op Assessment: Report given to RN and Post -op Vital signs reviewed and stable  Post vital signs: Reviewed and stable  Last Vitals:  Vitals:   03/31/17 0700 03/31/17 1008  BP: 115/60 (!) 105/35  Pulse: 87 85  Resp: (!) 26 20  Temp: 37.5 C     Last Pain:  Vitals:   03/31/17 0700  TempSrc:   PainSc: 4       Patients Stated Pain Goal: 3 (87/57/97 2820)  Complications: No apparent anesthesia complications

## 2017-03-31 NOTE — Progress Notes (Signed)
  Echocardiogram Echocardiogram Transesophageal has been performed.  Luis [name] 03/31/2017, 10:37 AM

## 2017-03-31 NOTE — Progress Notes (Signed)
The patient was examined and preop studies reviewed. There has been no change from the prior exam and the patient is ready for surgery. P;an removal of LVAD on [place]

## 2017-03-31 NOTE — Progress Notes (Signed)
Anesthesiology Follow-up:  Patient resting, breathing unlabored, hemodynamically stable with Impella out. He was  extubated at 12:50 today following removal of Impella under general anesthesia. Patient initially extubated in the OR but had to be re-intubated due to agitation and was taken to PACU and started on Precedex infusion and then extubated uneventfully after two hours.  VS: T- 38.1 BP- 134/34 (59) HR 85 (SR) RR 17 O2 sat 96% on 4L  PA 32/15 CO 5.8/3.2 PA Cox Sat 57%  Milrinone 0.375 mcg/kg/min  Stable at present following Impella removal, plan to recheck Cox later today.   Roberts Gaudy

## 2017-03-31 NOTE — Anesthesia Procedure Notes (Signed)
Procedure Name: Intubation Date/Time: 03/31/2017 9:28 AM Performed by: Gaylene Brooks Pre-anesthesia Checklist: Patient identified, Emergency Drugs available, Suction available and Patient being monitored Patient Re-evaluated:Patient Re-evaluated prior to inductionOxygen Delivery Method: Circle System Utilized Preoxygenation: Pre-oxygenation with 100% oxygen Intubation Type: IV induction Ventilation: Mask ventilation without difficulty and Oral airway inserted - appropriate to patient size Laryngoscope Size: Glidescope and 3 Grade View: Grade I Tube type: Subglottic suction tube Tube size: 7.5 mm Number of attempts: 1 Airway Equipment and Method: Stylet and Oral airway Placement Confirmation: ETT inserted through vocal cords under direct vision,  positive ETCO2 and breath sounds checked- equal and bilateral Secured at: 22 cm Tube secured with: Tape Dental Injury: Teeth and Oropharynx as per pre-operative assessment

## 2017-03-31 NOTE — Procedures (Signed)
Extubation Procedure Note  Patient Details:   Name: Luis [name] DOB: April 04, 1946 MRN: [id number]   Airway Documentation:  Airway 7.5 mm (Active)  Secured at (cm) 21 cm 03/31/2017 12:15 PM  Measured From Lips 03/31/2017 12:15 PM  Secured Location Right 03/31/2017 12:15 PM  Secured By Pink Tape 03/31/2017 12:15 PM  Site Condition Dry 03/31/2017 12:15 PM    Evaluation  O2 sats: stable throughout Complications: No apparent complications Patient did tolerate procedure well. Bilateral Breath Sounds: Clear   Yes   Pt. Was extubated to a 4L [place] without any complications, dyspnea or stridor noted. Pt. Achieved a -30 on NIF & 1,000 on VC. Pt. Was instructed on IS x 5, highest goal reached was 543mL.  Dereke Neumann, Eddie [place] 03/31/2017, 12:50 PM

## 2017-03-31 NOTE — Op Note (Signed)
Luis [name], Luis [name] NO.:  [id number]  MEDICAL RECORD NO.:  [id number]  LOCATION:  2H09C                        FACILITY:  [facility]  PHYSICIAN:  [name], M.D.  DATE OF BIRTH:  01-29-1946  DATE OF PROCEDURE:  03/31/2017 DATE OF DISCHARGE:                              OPERATIVE REPORT   OPERATION:  Removal of left ventricular assist device - Impella LD.  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  SURGEON:  [name], M.D.  ANESTHESIA:  General.  INDICATIONS:  The patient is a 71 year old Asian male who underwent emergency 2-vessel CABG and closure of PFO and placement of a percutaneous ventricular assist device (Impella LD) 6 days ago.  He had been extubated and weaned off VAD support.  He was brought back to the operating room for removal of the ventricular assist device through the side graft sewn onto the ascending aorta, exiting through the right neck.  The procedure has been explained with the patient and family and informed consent obtained.  DESCRIPTION OF PROCEDURE:  The patient was brought back to the operating room.  The patient has been on a reduced level of support from his ventricular assist device for several hours and hemodynamics had been maintained.  The patient was placed supine on the operating room table and he was intubated and anesthesia was induced.  A transesophageal echo probe was placed.  The Impella catheter was in good position.  Ejection fraction appeared to be approximately 35%.  Hemodynamic parameters were stable with a cardiac index of 1.9 to 2.0.  The patient's neck was prepped and  draped as a sterile field.  Also, his chest and left groin were prepped.  A proper time-out was performed.  The sutures securing the Impella catheter to the 10 mm graft were divided.  The spacers were removed inside the graft.  The graft was pinched and the catheter was slowly withdrawn from the left ventricle through the aortic valve, then up through the ascending aorta and out the aortic arch.  A vascular clamp was then used to clamp the graft after the catheter had been fully removed.  The patient remained hemodynamically stable.  Echo remained stable.  A GIA stapling device was then used to staple the graft below the level of the clamp.  There was a good staple closure.  This was reinforced with a single pledgeted suture of 5-0 Prolene.  The entire wound was irrigated with vancomycin irrigation.  It was hemostatic.  The subcutaneous layer was closed with interrupted 3-0 Vicryl.  The skin was closed with interrupted 3-0 nylon.  Sterile dressing was applied.  While the patient was under anesthesia, a new central line was placed via the right subclavian vein using Seldinger technique.  The catheter was passed, secured and flushed with heparin saline and a chest x-ray is pending.  The patient returned to the ICU after being extubated in the operating room.  [name], M.D.     PV/MEDQ  D:  03/31/2017  T:  03/31/2017  Job:  [id number]

## 2017-03-31 NOTE — Progress Notes (Signed)
Leave SGC cath in till am and recheck H/H  at 22:00 per Dr Haroldine Laws.

## 2017-03-31 NOTE — Progress Notes (Signed)
Advanced Heart Failure Rounding Note  Primary Cardiologist: Dr [name]  Subjective:    S/p emergent CABG x2 and Impella placement 03/25/17 for LM dissection  Extubated 6/29.  Impella removed in OR this am. Re-intubated during for airway protection with agitation.   Coox 72% this am.  45.5% s/p impella removal.  Milrinone at 0.375 mcg/kg/min currently (Turned up from 0.30 yesterday)  Bedside Echo yesterday with estimated EF 40-45%.  Intubated. Nods to questioning. Denies pain.   CVP 14-15 PA 24/10 (16)  Cardiac Index 1.9-2.0 in OR.  Creatinine, K, and Mg stable. Negative 1.1 L with IV lasix 40 mg BID yesterday. Weights inaccurate.    Objective:   Weight Range: 187 lb 9.8 oz (85.1 kg) Body mass index is 28.95 kg/m.   Vital Signs:   Temp:  [97.2 F (36.2 C)-99.9 F (37.7 C)] 97.2 F (36.2 C) (07/03 1045) Pulse Rate:  [81-104] 81 (07/03 1045) Resp:  [16-32] 22 (07/03 1045) BP: (89-131)/(35-85) 105/35 (07/03 1008) SpO2:  [94 %-98 %] 98 % (07/03 1045) Arterial Line BP: (78-142)/(37-72) 78/37 (07/03 1045) FiO2 (%):  [60 %] 60 % (07/03 1008) Weight:  [187 lb 9.8 oz (85.1 kg)] 187 lb 9.8 oz (85.1 kg) (07/03 0500) Last BM Date: 03/30/17  Weight change: Filed Weights   03/29/17 0500 03/30/17 0400 03/31/17 0500  Weight: 197 lb 5 oz (89.5 kg) 200 lb 6.4 oz (90.9 kg) 187 lb 9.8 oz (85.1 kg)    Intake/Output:   Intake/Output Summary (Last 24 hours) at 03/31/17 1115 Last data filed at 03/31/17 1040  Gross per 24 hour  Intake           2868.2 ml  Output             2127 ml  Net            741.2 ml      Physical Exam   General: Intubated. Awake. NAD.  HEENT: + ETT Neck: Supple. JVP to angle of jaw. carotids 2+ bilat; no bruits. No thyromegaly or nodule noted. Cor: PMI nondisplaced. RRR Lungs: + mechanical breath sounds.  Abdomen: Soft, non-tender, non-distended, no HSM. No bruits or masses. +BS  Extremities: No cyanosis, clubbing, rash, R and LLE no edema.    Neuro: Intubated. Awake. Nods to questioning. Follows commands.   Telemetry   Personally reviewed, NSR 80s  EKG    N/A  Labs    CBC  Recent Labs  03/31/17 0300 03/31/17 0906 03/31/17 1030  WBC 10.5  --  14.2*  HGB 9.6* 9.2* 9.5*  HCT 28.7* 27.0* 28.7*  MCV 85.2  --  87.0  PLT 105*  --  99*   Basic Metabolic Panel  Recent Labs  03/29/17 0400 03/30/17 0339 03/30/17 1607 03/31/17 0300 03/31/17 0906  NA 131* 133* 136  --  136  K 3.6 3.1* 4.1  --  4.4  CL 98* 100* 99*  --   --   CO2 22 24  --   --   --   GLUCOSE 213* 120* 107*  --   --   BUN 16 22* 22*  --   --   CREATININE 1.17 1.11 1.00  --   --   CALCIUM 7.7* 7.8*  --   --   --   MG 2.3 2.2  --  2.3  --    Liver Function Tests  Recent Labs  03/29/17 0400 03/30/17 0339  AST 49* 34  ALT 26 27  ALKPHOS  49 59  BILITOT 2.0* 1.6*  PROT 5.2* 5.4*  ALBUMIN 2.9* 2.9*   No results for input(s): LIPASE, AMYLASE in the last 72 hours. Cardiac Enzymes No results for input(s): CKTOTAL, CKMB, CKMBINDEX, TROPONINI in the last 72 hours.  BNP: BNP (last 3 results) No results for input(s): BNP in the last 8760 hours.  ProBNP (last 3 results) No results for input(s): PROBNP in the last 8760 hours.   D-Dimer No results for input(s): DDIMER in the last 72 hours. Hemoglobin A1C No results for input(s): HGBA1C in the last 72 hours. Fasting Lipid Panel No results for input(s): CHOL, HDL, LDLCALC, TRIG, CHOLHDL, LDLDIRECT in the last 72 hours. Thyroid Function Tests No results for input(s): TSH, T4TOTAL, T3FREE, THYROIDAB in the last 72 hours.  Invalid input(s): FREET3  Other results:   Imaging    Dg Chest Portable 1 View  Result Date: 03/31/2017 CLINICAL DATA:  71 year old male. Removal of left ventricular assist device. Placement right central line. Subsequent encounter. EXAM: PORTABLE CHEST 1 VIEW COMPARISON:  03/31/2017 6:01 a.m. chest x-ray. FINDINGS: Endotracheal tube placement with tip 2.7 cm above  the carina. Removal of Impella device. Right central line placement with tip in the region of the right atrium. To be within the distal superior vena cava, this can be retracted by 3.8 cm. Right Swan-Ganz catheter tip main pulmonary artery level. Mediastinal drains and bilateral chest tubes in place without pneumothorax. Subsegmental atelectatic changes greatest right lung base right upper lobe. Central pulmonary vascular prominence. Cardiomegaly. IMPRESSION: Endotracheal tube placement with tip 2.7 cm above the carina. Removal of Impella device. Right central line placement with tip in the region of the right atrium. To be within the distal superior vena cava, this can be retracted by 3.8 cm. Subsegmental atelectatic changes greatest right lung base right upper lobe. Central pulmonary vascular prominence. Cardiomegaly. Electronically Signed   By: Genia Del M.D.   On: 03/31/2017 10:07   Dg Chest Port 1 View  Result Date: 03/31/2017 CLINICAL DATA:  71 year old male post CABG. Left ventricular assist device. Subsequent encounter. EXAM: PORTABLE CHEST 1 VIEW COMPARISON:  03/30/2017. FINDINGS: Bilateral chest tubes in place without pneumothorax detected. Right Swan-Ganz catheter tip main pulmonary artery level. Impella heart pump unchanged in position. Mediastinal drains in place. Mediastinal and cardiac silhouette stable. Central pulmonary vascular prominence greater on the left. IMPRESSION: Overall, no significant change. Pulmonary vascular prominence greatest centrally and more notable on the left. Electronically Signed   By: Genia Del M.D.   On: 03/31/2017 07:38     Medications:     Scheduled Medications: . aspirin EC  325 mg Oral Daily   Or  . aspirin  324 mg Per Tube Daily  . bisacodyl  10 mg Oral Daily   Or  . bisacodyl  10 mg Rectal Daily  . Chlorhexidine Gluconate Cloth  6 each Topical Daily  . docusate sodium  200 mg Oral Daily  . [START ON 04/01/2017] furosemide  40 mg Intravenous  Daily  . insulin aspart  0-24 Units Subcutaneous Q4H  . [START ON 04/01/2017] insulin detemir  24 Units Subcutaneous BID  . pantoprazole (PROTONIX) IV  40 mg Intravenous Q24H  . rosuvastatin  40 mg Oral q1800  . sodium chloride flush  10-40 mL Intracatheter Q12H  . sodium chloride flush  3 mL Intravenous Q12H  . spironolactone  12.5 mg Oral Daily    Infusions: . sodium chloride 10 mL/hr at 03/31/17 0600  . sodium chloride    .  sodium chloride Stopped (03/30/17 1300)  . amiodarone 30 mg/hr (03/31/17 0721)  . cefTAZidime (FORTAZ)  IV Stopped (03/31/17 0123)  . dexmedetomidine (PRECEDEX) IV infusion 0.7 mcg/kg/hr (03/31/17 1030)  . epinephrine    . lactated ringers 10 mL/hr at 03/31/17 0600  . milrinone 0.375 mcg/kg/min (03/31/17 1040)  . phenylephrine (NEO-SYNEPHRINE) Adult infusion 70 mcg/min (03/31/17 1045)  . [START ON 04/01/2017] vancomycin      PRN Medications: sodium chloride, fentaNYL (SUBLIMAZE) injection, levalbuterol, ondansetron (ZOFRAN) IV, oxyCODONE, sodium chloride flush, sodium chloride flush, traMADol    Patient Profile   Mr Goodlin is 38 year admitted after LHC complicated by Left main dissection, acute MI, cardiogenic shock requiring urgent CABG x2 with impella 5.0 LD on 03/25/17  Assessment/Plan    1. Cardiogenic Shock in the setting of acute MI due to LM dissection - s/p emergent CABG 03/25/17 with insertion Impella LD 5.0  - s/p Impella removal this am.  - Coox 45.5% s/p impella removal. Ordered for recheck this afternoon. Pressures stable.  - Milrinone remains at 0.3 mcg/kg/min.  - Echo 03/27/17 Very poor images even with Definity. Suspect EF ~45%.  - Bedside Echo yesterday with estimated EF 40-45%.  - Up to chair as tolerated once extubated.  - No longer A-pacing. Now in NSR in 80s.  - CVP 14. IV lasix 40 mg ordered.   2. CAD - s/p CABG. No s/s ischemia - Continue ASA and statin. No change. .   3. Acute respiratory failure -Extubated 6/29.  -  Re-intubated in OR this am for agitation. Plan to extubate this afternoon.  - CXR this am s/p impella removal with central line in RA. Subsegmental atelectactis changes greatest right lung base LLL.   4. Acute blood loss anemia - Transfused 2u RBCs on 6/30. - Hgb 9.5 this am. Continue to follow.   5. AKI - Resolved. Stable. Continue to follow.   6. Hypokalemia/Hypomag - Stable today. Continue to follow.  - Continue spiro 12.5  Length of Stay: [place], [place]  03/31/2017, 11:15 AM  Advanced Heart Failure Team Pager [phone] (M-F; 7a - 4p)  Please contact [facility] for night-coverage after hours (4p -7a ) and weekends on [email or web address]  Patient seen and examined with the above-signed Advanced Practice Provider and/or Housestaff. I personally reviewed laboratory data, imaging studies and relevant notes. I independently examined the patient and formulated the important aspects of the plan. I have edited the note to reflect any of my changes or salient points. I have personally discussed the plan with the patient and/or family.  Underwent Impella removal in OR today  TEE showed EF ~ 35%. Initial co-ox was 46% after coming out of OR but more stable now. Remains on milrinone 0.375. CVP up. Has received lasix. Maintaining NSR.   Will continue supportive care. D/w DR. [name].   Glori Bickers, MD  5:07 PM

## 2017-03-31 NOTE — Brief Op Note (Signed)
03/25/2017 - 03/31/2017  9:14 AM  PATIENT:  Luis [name]  71 y.o. male  PRE-OPERATIVE DIAGNOSIS:  ICM, CABG with LVAD  POST-OPERATIVE DIAGNOSIS:  ICM, CABG with LVAD PROCEDURE:  Procedure(s): REMOVAL OF IMPELLA LEFT VENTRICULAR ASSIST DEVICE (N/A) TRANSESOPHAGEAL ECHOCARDIOGRAM (TEE) (N/A)  Placement of R subclavian central line  SURGEON:  Surgeon(s) and Role:    [name], MD - Primary  PHYSICIAN ASSISTANT:   ASSISTANTS: none   ANESTHESIA:   general  EBL:  Total I/O In: -  Out: 100 [Urine:100]  BLOOD ADMINISTERED:none  DRAINS: none   LOCAL MEDICATIONS USED:  NONE  SPECIMEN:  No Specimen  DISPOSITION OF SPECIMEN:  N/A  COUNTS:  YES  TOURNIQUET:  * No tourniquets in log *  DICTATION: .Dragon Dictation  PLAN OF CARE: return to 2 H -9  PATIENT DISPOSITION:  ICU - extubated and stable.   Delay start of Pharmacological VTE agent (>24hrs) due to surgical blood loss or risk of bleeding: yes

## 2017-03-31 NOTE — Anesthesia Procedure Notes (Signed)
Arterial Line Insertion Start/End7/11/2016 7:45 AM Performed by: Neldon Newport, CRNA  Preanesthetic checklist: patient identified, site marked, risks and benefits discussed, surgical consent, monitors and equipment checked, pre-op evaluation and timeout performed Lidocaine 1% used for infiltration Right, radial was placed Catheter size: 20 G Hand hygiene performed  and maximum sterile barriers used  Allen's test indicative of satisfactory collateral circulation Attempts: 1 Procedure performed without using ultrasound guided technique. Following insertion, Biopatch and dressing applied. Post procedure assessment: normal  Patient tolerated the procedure well with no immediate complications.

## 2017-03-31 NOTE — Progress Notes (Signed)
Patient ID: Luis [name], male   DOB: [birth year], 71 y.o.   MRN: [id number]   SICU Evening Rounds:   Hemodynamically stable  CI = 3.2 on epi 2, milrinone 0.3   Urine output good  CT output low  CBC    Component Value Date/Time   WBC 14.2 (H) 03/31/2017 1030   RBC 3.30 (L) 03/31/2017 1030   HGB 8.8 (L) 03/31/2017 2129   HCT 26.6 (L) 03/31/2017 2129   PLT 99 (L) 03/31/2017 1030   MCV 87.0 03/31/2017 1030   MCH 28.8 03/31/2017 1030   MCHC 33.1 03/31/2017 1030   RDW 16.3 (H) 03/31/2017 1030   LYMPHSABS 0.9 03/26/2017 0239   MONOABS 0.9 03/26/2017 0239   EOSABS 0.0 03/26/2017 0239   BASOSABS 0.0 03/26/2017 0239     BMET    Component Value Date/Time   NA 136 03/31/2017 1728   K 3.9 03/31/2017 1728   CL 100 (L) 03/31/2017 1728   CO2 24 03/30/2017 0339   GLUCOSE 83 03/31/2017 1728   BUN 22 (H) 03/31/2017 1728   CREATININE 1.00 03/31/2017 1728   CALCIUM 7.8 (L) 03/30/2017 0339   GFRNONAA >60 03/30/2017 0339   GFRAA >60 03/30/2017 0339     A/P:  Stable postop course after Impella removal.  Continue current plans

## 2017-03-31 NOTE — Anesthesia Procedure Notes (Addendum)
Procedure Name: Intubation Date/Time: 03/31/2017 8:00 AM Performed by: Gaylene Brooks Pre-anesthesia Checklist: Patient identified, Emergency Drugs available, Suction available and Patient being monitored Patient Re-evaluated:Patient Re-evaluated prior to inductionOxygen Delivery Method: Circle System Utilized Preoxygenation: Pre-oxygenation with 100% oxygen Intubation Type: IV induction Ventilation: Mask ventilation without difficulty Laryngoscope Size: Miller and 2 Grade View: Grade III Tube type: Oral Tube size: 8.0 mm Number of attempts: 2 Airway Equipment and Method: Stylet,  Oral airway and Bougie stylet Placement Confirmation: ETT inserted through vocal cords under direct vision,  positive ETCO2 and breath sounds checked- equal and bilateral Secured at: 22 cm Tube secured with: Tape Dental Injury: Bloody posterior oropharynx  Difficulty Due To: Difficulty was anticipated, Difficult Airway- due to reduced neck mobility and Difficult Airway- due to limited oral opening Comments: DL with Miller 2 and MAC 3 by CRNA.Marland Kitchengrade 3 view, unable to pass tube. DL with Sabra Heck 2 by Dr. Carney Living 3 view. Able to pass ETT over bougie stylet. +ETCO2, BBS=.

## 2017-04-01 ENCOUNTER — Inpatient Hospital Stay (HOSPITAL_COMMUNITY): Payer: Medicare Other

## 2017-04-01 ENCOUNTER — Encounter (HOSPITAL_COMMUNITY): Payer: Self-pay | Admitting: Cardiothoracic Surgery

## 2017-04-01 LAB — GLUCOSE, CAPILLARY
Glucose-Capillary: 120 mg/dL — ABNORMAL HIGH (ref 65–99)
Glucose-Capillary: 119 mg/dL — ABNORMAL HIGH (ref 65–99)
Glucose-Capillary: 102 mg/dL — ABNORMAL HIGH (ref 65–99)
Glucose-Capillary: 88 mg/dL (ref 65–99)
Glucose-Capillary: 95 mg/dL (ref 65–99)
Glucose-Capillary: 80 mg/dL (ref 65–99)

## 2017-04-01 LAB — COMPREHENSIVE METABOLIC PANEL
ALT: 23 U/L (ref 17–63)
AST: 26 U/L (ref 15–41)
Albumin: 2.6 g/dL — ABNORMAL LOW (ref 3.5–5.0)
Alkaline Phosphatase: 66 U/L (ref 38–126)
Anion gap: 12 (ref 5–15)
BUN: 19 mg/dL (ref 6–20)
CO2: 19 mmol/L — ABNORMAL LOW (ref 22–32)
Calcium: 7.7 mg/dL — ABNORMAL LOW (ref 8.9–10.3)
Chloride: 103 mmol/L (ref 101–111)
Creatinine, Ser: 1.17 mg/dL (ref 0.61–1.24)
GFR calc Af Amer: >60 mL/min (ref >60)
GFR calc non Af Amer: >60 mL/min (ref >60)
Glucose, Bld: 114 mg/dL — ABNORMAL HIGH (ref 65–99)
Potassium: 3.9 mmol/L (ref 3.5–5.1)
Sodium: 134 mmol/L — ABNORMAL LOW (ref 135–145)
Total Bilirubin: 1.6 mg/dL — ABNORMAL HIGH (ref 0.3–1.2)
Total Protein: 5.1 g/dL — ABNORMAL LOW (ref 6.5–8.1)

## 2017-04-01 LAB — POCT I-STAT, CHEM 8
BUN: 20 mg/dL (ref 6–20)
Calcium, Ion: 1.05 mmol/L — ABNORMAL LOW (ref 1.15–1.40)
Chloride: 102 mmol/L (ref 101–111)
Creatinine, Ser: 0.9 mg/dL (ref 0.61–1.24)
Glucose, Bld: 94 mg/dL (ref 65–99)
HCT: 31 % — ABNORMAL LOW (ref 39.0–52.0)
Hemoglobin: 10.5 g/dL — ABNORMAL LOW (ref 13.0–17.0)
Potassium: 3.5 mmol/L (ref 3.5–5.1)
Sodium: 135 mmol/L (ref 135–145)
TCO2: 22 mmol/L (ref 0–100)

## 2017-04-01 LAB — MAGNESIUM: Magnesium: 2.2 mg/dL (ref 1.7–2.4)

## 2017-04-01 LAB — BLOOD GAS, ARTERIAL
Acid-base deficit: 4.4 mmol/L — ABNORMAL HIGH (ref 0.0–2.0)
Bicarbonate: 19.2 mmol/L — ABNORMAL LOW (ref 20.0–28.0)
O2 Content: 2 L/min
O2 Saturation: 99 %
Patient temperature: 98.6
pCO2 arterial: 29.7 mmHg — ABNORMAL LOW (ref 32.0–48.0)
pH, Arterial: 7.426 (ref 7.350–7.450)
pO2, Arterial: 150 mmHg — ABNORMAL HIGH (ref 83.0–108.0)

## 2017-04-01 LAB — POCT I-STAT 3, ART BLOOD GAS (G3+)
Acid-base deficit: 2 mmol/L (ref 0.0–2.0)
Bicarbonate: 19.6 mmol/L — ABNORMAL LOW (ref 20.0–28.0)
O2 Saturation: 92 %
Patient temperature: 99
TCO2: 20 mmol/L (ref 0–100)
pCO2 arterial: 23.4 mmHg — ABNORMAL LOW (ref 32.0–48.0)
pH, Arterial: 7.532 — ABNORMAL HIGH (ref 7.350–7.450)
pO2, Arterial: 54 mmHg — ABNORMAL LOW (ref 83.0–108.0)

## 2017-04-01 LAB — CBC
HCT: 26.4 % — ABNORMAL LOW (ref 39.0–52.0)
Hemoglobin: 8.7 g/dL — ABNORMAL LOW (ref 13.0–17.0)
MCH: 28.9 pg (ref 26.0–34.0)
MCHC: 33 g/dL (ref 30.0–36.0)
MCV: 87.7 fL (ref 78.0–100.0)
Platelets: 127 10*3/uL — ABNORMAL LOW (ref 150–400)
RBC: 3.01 MIL/uL — ABNORMAL LOW (ref 4.22–5.81)
RDW: 16.5 % — ABNORMAL HIGH (ref 11.5–15.5)
WBC: 10.2 10*3/uL (ref 4.0–10.5)

## 2017-04-01 LAB — COOXEMETRY PANEL
Carboxyhemoglobin: 1.7 % — ABNORMAL HIGH (ref 0.5–1.5)
Carboxyhemoglobin: 1.3 % (ref 0.5–1.5)
Methemoglobin: 0.7 % (ref 0.0–1.5)
Methemoglobin: 1.2 % (ref 0.0–1.5)
O2 Saturation: 99 %
O2 Saturation: 56.3 %
Total hemoglobin: 8.7 g/dL — ABNORMAL LOW (ref 12.0–16.0)
Total hemoglobin: 9.3 g/dL — ABNORMAL LOW (ref 12.0–16.0)

## 2017-04-01 LAB — CALCIUM, IONIZED: Calcium, Ionized, Serum: 4.7 mg/dL (ref 4.5–5.6)

## 2017-04-01 MED ORDER — POTASSIUM CHLORIDE 10 MEQ/50ML IV SOLN
10.0000 meq | INTRAVENOUS | Status: AC
  Administered 2017-04-01 (×3): 10 meq via INTRAVENOUS
  Filled 2017-04-01: qty 50

## 2017-04-01 MED ORDER — VANCOMYCIN HCL IN DEXTROSE 1-5 GM/200ML-% IV SOLN
1000.0000 mg | Freq: Once | INTRAVENOUS | Status: AC
  Administered 2017-04-01: 1000 mg via INTRAVENOUS
  Filled 2017-04-01: qty 200

## 2017-04-01 MED ORDER — FUROSEMIDE 10 MG/ML IJ SOLN
40.0000 mg | Freq: Once | INTRAMUSCULAR | Status: AC
  Administered 2017-04-01: 40 mg via INTRAVENOUS

## 2017-04-01 MED ORDER — AMIODARONE HCL 200 MG PO TABS
200.0000 mg | ORAL_TABLET | Freq: Two times a day (BID) | ORAL | Status: DC
  Administered 2017-04-01 – 2017-04-07 (×12): 200 mg via ORAL
  Filled 2017-04-01 (×12): qty 1

## 2017-04-01 MED ORDER — PHENOL 1.4 % MT LIQD
1.0000 | OROMUCOSAL | Status: DC | PRN
  Administered 2017-04-01: 1 via OROMUCOSAL
  Filled 2017-04-01: qty 177

## 2017-04-01 MED ORDER — MILRINONE LACTATE IN DEXTROSE 20-5 MG/100ML-% IV SOLN
0.3750 ug/kg/min | INTRAVENOUS | Status: DC
  Administered 2017-04-01 (×3): 0.375 ug/kg/min via INTRAVENOUS
  Filled 2017-04-01 (×2): qty 100

## 2017-04-01 MED ORDER — ENOXAPARIN SODIUM 30 MG/0.3ML ~~LOC~~ SOLN
30.0000 mg | SUBCUTANEOUS | Status: DC
  Administered 2017-04-01 – 2017-04-07 (×6): 30 mg via SUBCUTANEOUS
  Filled 2017-04-01 (×6): qty 0.3

## 2017-04-01 MED ORDER — AMIODARONE HCL 200 MG PO TABS: 400.0000 mg | ORAL_TABLET | Freq: Two times a day (BID) | ORAL | Status: DC

## 2017-04-01 MED ORDER — DIGOXIN 125 MCG PO TABS
0.1250 mg | ORAL_TABLET | Freq: Every day | ORAL | Status: DC
  Administered 2017-04-01 – 2017-04-10 (×10): 0.125 mg via ORAL
  Filled 2017-04-01 (×10): qty 1

## 2017-04-01 NOTE — Progress Notes (Addendum)
Patient ID: Luis [name], male   DOB: 05/29/1946, 71 y.o.   MRN: [id number] TCTS DAILY ICU PROGRESS NOTE                   Luis [name].[address]            [place] [address]          [phone]   1 Day Post-Op Procedure(s) (LRB): REMOVAL OF IMPELLA LEFT VENTRICULAR ASSIST DEVICE (N/A) TRANSESOPHAGEAL ECHOCARDIOGRAM (TEE) (N/A)  Total Length of Stay:  LOS: 7 days   Subjective: Awake and alert   Objective: Vital signs in last 24 hours: Temp:  [97 F (36.1 C)-100.8 F (38.2 C)] 99.7 F (37.6 C) (07/04 0630) Pulse Rate:  [75-104] 102 (07/04 0630) Cardiac Rhythm: Normal sinus rhythm (07/04 0400) Resp:  [16-33] 25 (07/04 0630) BP: (94-121)/(35-101) 112/61 (07/04 0630) SpO2:  [93 %-100 %] 97 % (07/04 0630) Arterial Line BP: (78-177)/(31-61) 107/46 (07/04 0630) FiO2 (%):  [40 %-60 %] 40 % (07/03 1215) Weight:  [197 lb 15.6 oz (89.8 kg)] 197 lb 15.6 oz (89.8 kg) (07/04 0530)  Filed Weights   03/30/17 0400 03/31/17 0500 04/01/17 0530  Weight: 200 lb 6.4 oz (90.9 kg) 187 lb 9.8 oz (85.1 kg) 197 lb 15.6 oz (89.8 kg)    Weight change: 10 lb 5.8 oz (4.7 kg)   Hemodynamic parameters for last 24 hours: PAP: (25-40)/(8-20) 25/10 CVP:  [7 mmHg-22 mmHg] 14 mmHg CO:  [5.8 L/min-6.3 L/min] 6.2 L/min CI:  [3.2 L/min/m2-21.1 L/min/m2] 3.2 L/min/m2  Intake/Output from previous day: 07/03 0701 - 07/04 0700 In: 2353.3 [I.V.:2303.3; IV Piggyback:50] Out: 6270 [JJKKX:3818; Blood:50; Chest Tube:50]  Intake/Output this shift: No intake/output data recorded.  Current Meds: Scheduled Meds: . aspirin EC  325 mg Oral Daily   Or  . aspirin  324 mg Per Tube Daily  . bisacodyl  10 mg Oral Daily   Or  . bisacodyl  10 mg Rectal Daily  . Chlorhexidine Gluconate Cloth  6 each Topical Daily  . docusate sodium  200 mg Oral Daily  . furosemide  40 mg Intravenous Daily  . insulin aspart  0-24 Units Subcutaneous Q4H  . insulin detemir  24 Units Subcutaneous BID  . pantoprazole  (PROTONIX) IV  40 mg Intravenous Q24H  . rosuvastatin  40 mg Oral q1800  . sodium bicarbonate  25 mEq Intravenous Once  . sodium chloride flush  10-40 mL Intracatheter Q12H  . sodium chloride flush  3 mL Intravenous Q12H  . spironolactone  12.5 mg Oral Daily   Continuous Infusions: . sodium chloride 10 mL/hr at 03/31/17 0600  . sodium chloride    . sodium chloride Stopped (03/30/17 1300)  . amiodarone 30 mg/hr (04/01/17 0758)  . cefTAZidime (FORTAZ)  IV 1 g (04/01/17 0757)  . dexmedetomidine (PRECEDEX) IV infusion 0.2 mcg/kg/hr (03/31/17 1200)  . epinephrine 2 mcg/min (04/01/17 0200)  . lactated ringers 10 mL/hr at 03/31/17 0600  . milrinone 0.337 mcg/kg/min (04/01/17 0757)  . phenylephrine (NEO-SYNEPHRINE) Adult infusion 20 mcg/min (03/31/17 1300)  . vancomycin     PRN Meds:.sodium chloride, fentaNYL (SUBLIMAZE) injection, levalbuterol, ondansetron (ZOFRAN) IV, oxyCODONE, sodium chloride flush, sodium chloride flush, traMADol  General appearance: alert, cooperative and no distress Neurologic: intact [name]: regular rate and rhythm, S1, S2 normal, no murmur, click, rub or gallop Lungs: diminished breath sounds bibasilar Abdomen: soft, non-tender; bowel sounds normal; no masses,  no organomegaly Extremities: extremities normal, atraumatic, no cyanosis or edema and Homans sign  is negative, no sign of DVT Wound: sternum intact  Lab Results: CBC: Recent Labs  03/31/17 1030  03/31/17 2129 04/01/17 0400  WBC 14.2*  --   --  10.2  HGB 9.5*  < > 8.8* 8.7*  HCT 28.7*  < > 26.6* 26.4*  PLT 99*  --   --  127*  < > = values in this interval not displayed. BMET:  Recent Labs  03/30/17 0339  03/31/17 1728 04/01/17 0400  NA 133*  < > 136 134*  K 3.1*  < > 3.9 3.9  CL 100*  < > 100* 103  CO2 24  --   --  19*  GLUCOSE 120*  < > 83 114*  BUN 22*  < > 22* 19  CREATININE 1.11  < > 1.00 1.17  CALCIUM 7.8*  --   --  7.7*  < > = values in this interval not displayed.  CMET: Lab  Results  Component Value Date   WBC 10.2 04/01/2017   HGB 8.7 (L) 04/01/2017   HCT 26.4 (L) 04/01/2017   PLT 127 (L) 04/01/2017   GLUCOSE 114 (H) 04/01/2017   ALT 23 04/01/2017   AST 26 04/01/2017   NA 134 (L) 04/01/2017   K 3.9 04/01/2017   CL 103 04/01/2017   CREATININE 1.17 04/01/2017   BUN 19 04/01/2017   CO2 19 (L) 04/01/2017   INR 1.30 03/25/2017   HGBA1C 6.1 (H) 03/26/2017      PT/INR: No results for input(s): LABPROT, INR in the last 72 hours. Radiology: Dg Chest Port 1 View  Result Date: 04/01/2017 CLINICAL DATA:  CABG EXAM: PORTABLE CHEST 1 VIEW COMPARISON:  03/31/2017 FINDINGS: Changes of CABG. Bilateral chest tubes remain in place. No pneumothorax. Right central lines including Swan-Ganz catheter are unchanged. Interval extubation. Mild vascular congestion and bibasilar atelectasis. IMPRESSION: Postoperative changes.  No pneumothorax. Vascular congestion, bibasilar atelectasis. Electronically Signed   By: Rolm Baptise M.D.   On: 04/01/2017 07:58   Dg Chest Portable 1 View  Result Date: 03/31/2017 CLINICAL DATA:  71 year old male. Removal of left ventricular assist device. Placement right central line. Subsequent encounter. EXAM: PORTABLE CHEST 1 VIEW COMPARISON:  03/31/2017 6:01 a.m. chest x-ray. FINDINGS: Endotracheal tube placement with tip 2.7 cm above the carina. Removal of Impella device. Right central line placement with tip in the region of the right atrium. To be within the distal superior vena cava, this can be retracted by 3.8 cm. Right Swan-Ganz catheter tip main pulmonary artery level. Mediastinal drains and bilateral chest tubes in place without pneumothorax. Subsegmental atelectatic changes greatest right lung base right upper lobe. Central pulmonary vascular prominence. Cardiomegaly. IMPRESSION: Endotracheal tube placement with tip 2.7 cm above the carina. Removal of Impella device. Right central line placement with tip in the region of the right atrium. To be  within the distal superior vena cava, this can be retracted by 3.8 cm. Subsegmental atelectatic changes greatest right lung base right upper lobe. Central pulmonary vascular prominence. Cardiomegaly. Electronically Signed   By: Genia Del M.D.   On: 03/31/2017 10:07   Cox: poor sable  cvp 12 Co 6.8    Assessment/Plan: S/P Procedure(s) (LRB): REMOVAL OF IMPELLA LEFT VENTRICULAR ASSIST DEVICE (N/A) TRANSESOPHAGEAL ECHOCARDIOGRAM (TEE) (N/A) Mobilize Diuresis d/c tubes/lines Still on milrinone 0.375 , epi 2, Cordarone drips  Convert to po Cordarone Wean off epi    Grace Isaac 04/01/2017 8:38 AM

## 2017-04-01 NOTE — Progress Notes (Addendum)
Advanced Heart Failure Rounding Note  Primary Cardiologist: Dr [name]  Subjective:    S/p emergent CABG x2 and Impella placement 03/25/17 for LM dissection  Extubated 6/29.  Impella removed in OR 7/3. EF 30-35% in OR RV mild HK   Now extubated.  On epi. Co-ox 99% (inaccurate). CVP 12  PA 33/16 CO/CI 6.2/3.2 on low-dose epi.   Epi now off. On milrinone 0.375  Feels ok. No dyspnea. + flatus Weak. Low-grade temps. Mild edema on CXR (viewed personally). On ceftaz for ? Aspiration   Objective:   Weight Range: 89.8 kg (197 lb 15.6 oz) Body mass index is 30.55 kg/m.   Vital Signs:   Temp:  [97 F (36.1 C)-100.8 F (38.2 C)] 99.9 F (37.7 C) (07/04 0900) Pulse Rate:  [63-104] 63 (07/04 1000) Resp:  [16-33] 26 (07/04 1000) BP: (94-121)/(38-101) 101/54 (07/04 1000) SpO2:  [93 %-100 %] 99 % (07/04 1000) Arterial Line BP: (78-177)/(31-61) 122/45 (07/04 1000) FiO2 (%):  [40 %-50 %] 40 % (07/03 1215) Weight:  [89.8 kg (197 lb 15.6 oz)] 89.8 kg (197 lb 15.6 oz) (07/04 0530) Last BM Date: 03/30/17  Weight change: Filed Weights   03/30/17 0400 03/31/17 0500 04/01/17 0530  Weight: 90.9 kg (200 lb 6.4 oz) 85.1 kg (187 lb 9.8 oz) 89.8 kg (197 lb 15.6 oz)    Intake/Output:   Intake/Output Summary (Last 24 hours) at 04/01/17 1024 Last data filed at 04/01/17 1000  Gross per 24 hour  Intake             1444 ml  Output             1435 ml  Net                9 ml      Physical Exam   General:  Fatigued appearing. NAD HEENT: normal Neck: supple. JVP to jaw. R Warrens TLC. Carotids 2+ bilat; no bruits. No lymphadenopathy or thryomegaly appreciated. Cor: PMI nondisplaced. Wound clean. Sternotomy ok . Distant regular. Lungs: clear Abdomen: soft, nontender, nondistended. No hepatosplenomegaly. No bruits or masses. Good bowel sounds. Extremities: no cyanosis, clubbing, rash, edema + Foley  Neuro: alert & orientedx3, cranial nerves grossly intact. moves all 4 extremities w/o  difficulty. Affect pleasant   Telemetry   Personally reviewed, NSR 70-80s  EKG    N/A  Labs    CBC  Recent Labs  03/31/17 1030  03/31/17 2129 04/01/17 0400  WBC 14.2*  --   --  10.2  HGB 9.5*  < > 8.8* 8.7*  HCT 28.7*  < > 26.6* 26.4*  MCV 87.0  --   --  87.7  PLT 99*  --   --  127*  < > = values in this interval not displayed. Basic Metabolic Panel  Recent Labs  03/30/17 0339  03/31/17 0300  03/31/17 1728 04/01/17 0400  NA 133*  < >  --   < > 136 134*  K 3.1*  < >  --   < > 3.9 3.9  CL 100*  < >  --   --  100* 103  CO2 24  --   --   --   --  19*  GLUCOSE 120*  < >  --   --  83 114*  BUN 22*  < >  --   --  22* 19  CREATININE 1.11  < >  --   --  1.00 1.17  CALCIUM 7.8*  --   --   --   --  7.7*  MG 2.2  --  2.3  --   --  2.2  < > = values in this interval not displayed. Liver Function Tests  Recent Labs  03/30/17 0339 04/01/17 0400  AST 34 26  ALT 27 23  ALKPHOS 59 66  BILITOT 1.6* 1.6*  PROT 5.4* 5.1*  ALBUMIN 2.9* 2.6*   No results for input(s): LIPASE, AMYLASE in the last 72 hours. Cardiac Enzymes No results for input(s): CKTOTAL, CKMB, CKMBINDEX, TROPONINI in the last 72 hours.  BNP: BNP (last 3 results) No results for input(s): BNP in the last 8760 hours.  ProBNP (last 3 results) No results for input(s): PROBNP in the last 8760 hours.   D-Dimer No results for input(s): DDIMER in the last 72 hours. Hemoglobin A1C No results for input(s): HGBA1C in the last 72 hours. Fasting Lipid Panel No results for input(s): CHOL, HDL, LDLCALC, TRIG, CHOLHDL, LDLDIRECT in the last 72 hours. Thyroid Function Tests No results for input(s): TSH, T4TOTAL, T3FREE, THYROIDAB in the last 72 hours.  Invalid input(s): FREET3  Other results:   Imaging    Dg Chest Port 1 View  Result Date: 04/01/2017 CLINICAL DATA:  CABG EXAM: PORTABLE CHEST 1 VIEW COMPARISON:  03/31/2017 FINDINGS: Changes of CABG. Bilateral chest tubes remain in place. No pneumothorax.  Right central lines including Swan-Ganz catheter are unchanged. Interval extubation. Mild vascular congestion and bibasilar atelectasis. IMPRESSION: Postoperative changes.  No pneumothorax. Vascular congestion, bibasilar atelectasis. Electronically Signed   By: Rolm Baptise M.D.   On: 04/01/2017 07:58     Medications:     Scheduled Medications: . amiodarone  400 mg Oral BID  . aspirin EC  325 mg Oral Daily   Or  . aspirin  324 mg Per Tube Daily  . bisacodyl  10 mg Oral Daily   Or  . bisacodyl  10 mg Rectal Daily  . Chlorhexidine Gluconate Cloth  6 each Topical Daily  . docusate sodium  200 mg Oral Daily  . furosemide  40 mg Intravenous Daily  . insulin aspart  0-24 Units Subcutaneous Q4H  . insulin detemir  24 Units Subcutaneous BID  . pantoprazole (PROTONIX) IV  40 mg Intravenous Q24H  . rosuvastatin  40 mg Oral q1800  . sodium bicarbonate  25 mEq Intravenous Once  . sodium chloride flush  10-40 mL Intracatheter Q12H  . sodium chloride flush  3 mL Intravenous Q12H  . spironolactone  12.5 mg Oral Daily    Infusions: . sodium chloride 10 mL/hr at 03/31/17 0600  . sodium chloride    . sodium chloride Stopped (03/30/17 1300)  . amiodarone 30 mg/hr (04/01/17 0758)  . cefTAZidime (FORTAZ)  IV 1 g (04/01/17 0757)  . dexmedetomidine (PRECEDEX) IV infusion 0.2 mcg/kg/hr (03/31/17 1200)  . epinephrine 1.5 mcg/min (04/01/17 0900)  . lactated ringers 10 mL/hr at 03/31/17 0600  . milrinone    . vancomycin      PRN Medications: sodium chloride, fentaNYL (SUBLIMAZE) injection, levalbuterol, ondansetron (ZOFRAN) IV, oxyCODONE, phenol, sodium chloride flush, sodium chloride flush, traMADol    Patient Profile   Luis [name] is 89 year admitted after LHC complicated by Left main dissection, acute MI, cardiogenic shock requiring urgent CABG x2 with impella 5.0 LD on 03/25/17  Assessment/Plan    1. Cardiogenic Shock in the setting of acute MI due to LM dissection - s/p emergent CABG  03/25/17 with insertion Impella LD 5.0  - s/p Impella removal 7/3 - EF 30-35% by intra-op TEE on  7/3 - Epi weaned off. Remains on low-dose milrinone. Co-ox inaccurate.  - Remains volume overloaded. Diurese with lasix 40 IV bid - Swan numbers viewed personally. Can d/c Swan today  - Follow CVP and co-ox. Watch for RV failure - Continue spiro. Add digoxin  - No BB yet. Losartan vs entresto soon.   2. 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 - s/p CABG. No s/s ischemia - Continue ASA and statin. No change.   3. Acute respiratory failure - Extubated 6/29.  - Re-intubated in OR on 7/3. Now extubated - CXR with mild CHF. Weight up 10-15 pounds. Continue slow diuresis  4. Acute blood loss anemia - Transfused 2u RBCs on 6/30. - Hgb 8.7 this am. Low threshold to transfuse  5. AKI - Resolved. Stable. Continue to follow.   6. Hypokalemia/Hypomag - K 3.9 today. Continue to follow.  - Continue spiro 12.5  7. ID - On ceftaz for ? Aspiration   8. PAF, post-op (transient) - now in NSR. amio switched to po. Will decrease to 200 bid  D/w Dr. Ron Agee.  CRITICAL CARE Performed by: Glori Bickers  Total critical care time: 35 minutes  Critical care time was exclusive of separately billable procedures and treating other patients.  Critical care was necessary to treat or prevent imminent or life-threatening deterioration.  Critical care was time spent personally by me (independent of midlevel providers or residents) on the following activities: development of treatment plan with patient and/or surrogate as well as nursing, discussions with consultants, evaluation of patient's response to treatment, examination of patient, obtaining history from patient or surrogate, ordering and performing treatments and interventions, ordering and review of laboratory studies, ordering and review of radiographic studies, pulse oximetry and re-evaluation of patient's condition.   Length of Stay:  7  Glori Bickers, MD  04/01/2017, 10:24 AM  Advanced Heart Failure Team Pager [phone] (M-F; 7a - 4p)  Please contact [facility] for night-coverage after hours (4p -7a ) and weekends on [email or web address]

## 2017-04-01 NOTE — Evaluation (Signed)
Physical Therapy Evaluation Patient Details Name: Josafat Enrico MRN: [id number] DOB: 04/06/1946 Today's Date: 04/01/2017   History of Present Illness  Mr Lederman is 30 year admitted after LHC complicated by Left main dissection, acute MI, cardiogenic shock requiring urgent CABG x2 with impella.  Extubated 7/3 with Impella removed as well.   Clinical Impression  Pt admitted with above diagnosis. Pt currently with functional limitations due to the deficits listed below (see PT Problem List). Pt was able to ambulate with RW with mod assist with chair follow.  Needs continued therapy to progress pt.  SNF would be appropriate.  Will follow acutely.  Pt will benefit from skilled PT to increase their independence and safety with mobility to allow discharge to the venue listed below.      Follow Up Recommendations SNF;Supervision/Assistance - 24 hour    Equipment Recommendations  Other (comment) (TBA)    Recommendations for Other Services OT consult     Precautions / Restrictions Precautions Precautions: Fall;Sternal Restrictions Weight Bearing Restrictions: No      Mobility  Bed Mobility Overal bed mobility: Needs Assistance Bed Mobility: Rolling;Sidelying to Sit Rolling: Mod assist Sidelying to sit: Mod assist;Max assist;HOB elevated       General bed mobility comments: Pt got LEs to EOB and off bed but needed close to max assist for elevation of trunk and incr time to obtain balance at EOB.  Flexed posture.   Transfers Overall transfer level: Needs assistance Equipment used: Rolling walker (2 wheeled) Transfers: Sit to/from Stand Sit to Stand: Mod assist;Min assist;+2 safety/equipment         General transfer comment: Pt able to stand holding pillow and also placing hands on knees.  Pt did well with cues for hand placement. Sometimes took 2 attempts to come to stand   Ambulation/Gait Ambulation/Gait assistance: Mod assist;+2 safety/equipment;Min assist Ambulation Distance  (Feet): 60 Feet (30 feet x 2) Assistive device: Rolling walker (2 wheeled) Gait Pattern/deviations: Step-through pattern;Decreased stride length;Decreased step length - right;Decreased weight shift to right;Leaning posteriorly;Drifts right/left;Trunk flexed;Wide base of support   Gait velocity interpretation: Below normal speed for age/gender General Gait Details: Pt was able to ambulate with RW with cues to stay close to RW and assist to steer RW.  Pt leaning posterior slightly needing mod assist at times to right balance.  Pt keeps right LE turned out.  Folllowed pt with chair with pt needing one seated rest break due to desats.    Stairs            Wheelchair Mobility    Modified Rankin (Stroke Patients Only)       Balance Overall balance assessment: Needs assistance Sitting-balance support: No upper extremity supported;Feet supported Sitting balance-Leahy Scale: Fair   Postural control: Posterior lean Standing balance support: Bilateral upper extremity supported;During functional activity Standing balance-Leahy Scale: Poor Standing balance comment: relies on RW for balance                             Pertinent Vitals/Pain Pain Assessment: Faces Faces Pain Scale: Hurts little more Pain Location: incisional Pain Descriptors / Indicators: Operative site guarding Pain Intervention(s): Limited activity within patient's tolerance;Monitored during session;Premedicated before session;Repositioned    Home Living Family/patient expects to be discharged to:: Private residence Living Arrangements: Spouse/significant other Available Help at Discharge: Family;Available 24 hours/day Type of Home: House Home Access: Stairs to enter Entrance Stairs-Rails: Right;Left;Can reach both Entrance Stairs-Number of Steps: 3 Home Layout:  One level Home Equipment: None Additional Comments: Pt reports he is retired.     Prior Function Level of Independence: Independent                Hand Dominance        Extremity/Trunk Assessment   Upper Extremity Assessment Upper Extremity Assessment: Defer to OT evaluation    Lower Extremity Assessment Lower Extremity Assessment: Generalized weakness    Cervical / Trunk Assessment Cervical / Trunk Assessment: Kyphotic  Communication   Communication: No difficulties  Cognition Arousal/Alertness: Awake/alert Behavior During Therapy: Flat affect Overall Cognitive Status: Within Functional Limits for tasks assessed                                        General Comments General comments (skin integrity, edema, etc.): HR 90-110 bpm, BP stable and sats to mid 80's on 2L to 4L with activity.     Exercises General Exercises - Lower Extremity Long Arc Quad: AROM;Both;10 reps;Seated   Assessment/Plan    PT Assessment Patient needs continued PT services  PT Problem List Decreased activity tolerance;Decreased balance;Decreased mobility;Decreased knowledge of use of DME;Decreased safety awareness;Decreased knowledge of precautions;Cardiopulmonary status limiting activity;Pain       PT Treatment Interventions DME instruction;Gait training;Stair training;Functional mobility training;Therapeutic activities;Therapeutic exercise;Balance training;Patient/family education    PT Goals (Current goals can be found in the Care Plan section)  Acute Rehab PT Goals Patient Stated Goal: to go home PT Goal Formulation: With patient Time For Goal Achievement: 04/15/17 Potential to Achieve Goals: Good    Frequency Min 3X/week   Barriers to discharge        Co-evaluation               AM-PAC PT "6 Clicks" Daily Activity  Outcome Measure Difficulty turning over in bed (including adjusting bedclothes, sheets and blankets)?: Total Difficulty moving from lying on back to sitting on the side of the bed? : Total Difficulty sitting down on and standing up from a chair with arms (e.g., wheelchair, bedside  commode, etc,.)?: Total Help needed moving to and from a bed to chair (including a wheelchair)?: A Lot Help needed walking in hospital room?: A Lot Help needed climbing 3-5 steps with a railing? : Total 6 Click Score: 8    End of Session Equipment Utilized During Treatment: Gait belt;Oxygen Activity Tolerance: Patient limited by fatigue;Patient limited by pain Patient left: in chair;with call bell/phone within reach Nurse Communication: Mobility status PT Visit Diagnosis: Unsteadiness on feet (R26.81);Muscle weakness (generalized) (M62.81);Pain Pain - part of body:  (incisional )    Time: 7262-0355 PT Time Calculation (min) (ACUTE ONLY): 38 min   Charges:   PT Evaluation $PT Eval Moderate Complexity: 1 Procedure PT Treatments $Gait Training: 23-37 mins   PT G Codes:        Julyssa Kyer,PT Acute Rehabilitation [phone] (pager)   Denice Paradise 04/01/2017, 12:48 PM

## 2017-04-01 NOTE — Progress Notes (Signed)
Patient ID: Luis [name], male   DOB: [birth year], 71 y.o.   MRN: [id number] EVENING ROUNDS NOTE :     [address]       [place] [address]             [phone]                 1 Day Post-Op Procedure(s) (LRB): REMOVAL OF IMPELLA LEFT VENTRICULAR ASSIST DEVICE (N/A) TRANSESOPHAGEAL ECHOCARDIOGRAM (TEE) (N/A)  Total Length of Stay:  LOS: 7 days  BP (!) 102/50   Pulse 87   Temp 99.5 F (37.5 C) (Oral)   Resp 19   Ht 5' 7.5" (1.715 m)   Wt 197 lb 15.6 oz (89.8 kg)   SpO2 97%   BMI 30.55 kg/m   .Intake/Output      07/03 0701 - 07/04 0700 07/04 0701 - 07/05 0700   P.O.  120   I.V. (mL/kg) 2303.3 (25.6) 315.3 (3.5)   Other     IV Piggyback 50 400   Total Intake(mL/kg) 2353.3 (26.2) 835.3 (9.3)   Urine (mL/kg/hr) 1485 (0.7) 1675 (1.6)   Stool  2 (0)   Blood 50 (0)    Chest Tube 50 (0) 0 (0)   Total Output 1585 1677   Net +768.3 -841.7        Stool Occurrence  2 x     . sodium chloride 10 mL/hr at 03/31/17 0600  . sodium chloride    . sodium chloride Stopped (03/30/17 1300)  . cefTAZidime (FORTAZ)  IV Stopped (04/01/17 1659)  . dexmedetomidine (PRECEDEX) IV infusion 0.2 mcg/kg/hr (03/31/17 1200)  . lactated ringers 10 mL/hr at 03/31/17 0600  . milrinone 0.375 mcg/kg/min (04/01/17 1700)  . potassium chloride 10 mEq (04/01/17 1807)     Lab Results  Component Value Date   WBC 10.2 04/01/2017   HGB 10.5 (L) 04/01/2017   HCT 31.0 (L) 04/01/2017   PLT 127 (L) 04/01/2017   GLUCOSE 94 04/01/2017   ALT 23 04/01/2017   AST 26 04/01/2017   NA 135 04/01/2017   K 3.5 04/01/2017   CL 102 04/01/2017   CREATININE 0.90 04/01/2017   BUN 20 04/01/2017   CO2 19 (L) 04/01/2017   INR 1.30 03/25/2017   HGBA1C 6.1 (H) 03/26/2017   Epi off Stable day   Grace Isaac MD  Beeper [phone] Office [phone] 04/01/2017 6:58 PM

## 2017-04-02 ENCOUNTER — Encounter (HOSPITAL_COMMUNITY): Payer: Self-pay | Admitting: Cardiothoracic Surgery

## 2017-04-02 ENCOUNTER — Inpatient Hospital Stay (HOSPITAL_COMMUNITY): Payer: Medicare Other

## 2017-04-02 DIAGNOSIS — J939 Pneumothorax, unspecified: Secondary | ICD-10-CM

## 2017-04-02 DIAGNOSIS — J95811 Postprocedural pneumothorax: Secondary | ICD-10-CM

## 2017-04-02 LAB — GLUCOSE, CAPILLARY
Glucose-Capillary: 106 mg/dL — ABNORMAL HIGH (ref 65–99)
Glucose-Capillary: 111 mg/dL — ABNORMAL HIGH (ref 65–99)
Glucose-Capillary: 119 mg/dL — ABNORMAL HIGH (ref 65–99)
Glucose-Capillary: 81 mg/dL (ref 65–99)
Glucose-Capillary: 83 mg/dL (ref 65–99)

## 2017-04-02 LAB — COMPREHENSIVE METABOLIC PANEL
ALT: 19 U/L (ref 17–63)
AST: 21 U/L (ref 15–41)
Albumin: 2.7 g/dL — ABNORMAL LOW (ref 3.5–5.0)
Alkaline Phosphatase: 63 U/L (ref 38–126)
Anion gap: 8 (ref 5–15)
BUN: 17 mg/dL (ref 6–20)
CO2: 22 mmol/L (ref 22–32)
Calcium: 7.8 mg/dL — ABNORMAL LOW (ref 8.9–10.3)
Chloride: 106 mmol/L (ref 101–111)
Creatinine, Ser: 1.05 mg/dL (ref 0.61–1.24)
GFR calc Af Amer: >60 mL/min (ref >60)
GFR calc non Af Amer: >60 mL/min (ref >60)
Glucose, Bld: 78 mg/dL (ref 65–99)
Potassium: 3.5 mmol/L (ref 3.5–5.1)
Sodium: 136 mmol/L (ref 135–145)
Total Bilirubin: 1.4 mg/dL — ABNORMAL HIGH (ref 0.3–1.2)
Total Protein: 5.3 g/dL — ABNORMAL LOW (ref 6.5–8.1)

## 2017-04-02 LAB — POCT I-STAT, CHEM 8
BUN: 20 mg/dL (ref 6–20)
Calcium, Ion: 1.09 mmol/L — ABNORMAL LOW (ref 1.15–1.40)
Chloride: 101 mmol/L (ref 101–111)
Creatinine, Ser: 0.9 mg/dL (ref 0.61–1.24)
Glucose, Bld: 109 mg/dL — ABNORMAL HIGH (ref 65–99)
HCT: 28 % — ABNORMAL LOW (ref 39.0–52.0)
Hemoglobin: 9.5 g/dL — ABNORMAL LOW (ref 13.0–17.0)
Potassium: 4.6 mmol/L (ref 3.5–5.1)
Sodium: 137 mmol/L (ref 135–145)
TCO2: 22 mmol/L (ref 0–100)

## 2017-04-02 LAB — CBC
HCT: 28.2 % — ABNORMAL LOW (ref 39.0–52.0)
Hemoglobin: 9.2 g/dL — ABNORMAL LOW (ref 13.0–17.0)
MCH: 28.8 pg (ref 26.0–34.0)
MCHC: 32.6 g/dL (ref 30.0–36.0)
MCV: 88.4 fL (ref 78.0–100.0)
Platelets: 204 10*3/uL (ref 150–400)
RBC: 3.19 MIL/uL — ABNORMAL LOW (ref 4.22–5.81)
RDW: 16.8 % — ABNORMAL HIGH (ref 11.5–15.5)
WBC: 10.8 10*3/uL — ABNORMAL HIGH (ref 4.0–10.5)

## 2017-04-02 LAB — COOXEMETRY PANEL
Carboxyhemoglobin: 1.9 % — ABNORMAL HIGH (ref 0.5–1.5)
Methemoglobin: 0.7 % (ref 0.0–1.5)
O2 Saturation: 71.5 %
Total hemoglobin: 9.4 g/dL — ABNORMAL LOW (ref 12.0–16.0)

## 2017-04-02 LAB — MAGNESIUM: Magnesium: 2.2 mg/dL (ref 1.7–2.4)

## 2017-04-02 LAB — POCT I-STAT 3, ART BLOOD GAS (G3+)
Acid-base deficit: 1 mmol/L (ref 0.0–2.0)
Bicarbonate: 20.7 mmol/L (ref 20.0–28.0)
O2 Saturation: 95 %
Patient temperature: 99.2
TCO2: 21 mmol/L (ref 0–100)
pCO2 arterial: 24.6 mmHg — ABNORMAL LOW (ref 32.0–48.0)
pH, Arterial: 7.535 — ABNORMAL HIGH (ref 7.350–7.450)
pO2, Arterial: 67 mmHg — ABNORMAL LOW (ref 83.0–108.0)

## 2017-04-02 MED ORDER — AMIODARONE HCL IN DEXTROSE 360-4.14 MG/200ML-% IV SOLN
60.0000 mg/h | INTRAVENOUS | Status: AC
  Administered 2017-04-02: 60 mg/h via INTRAVENOUS

## 2017-04-02 MED ORDER — FENTANYL CITRATE (PF) 100 MCG/2ML IJ SOLN
25.0000 ug | INTRAMUSCULAR | Status: DC | PRN
  Administered 2017-04-02: 25 ug via INTRAVENOUS
  Filled 2017-04-02: qty 2

## 2017-04-02 MED ORDER — AMIODARONE HCL IN DEXTROSE 360-4.14 MG/200ML-% IV SOLN: 30.0000 mg/h | INTRAVENOUS | Status: DC

## 2017-04-02 MED ORDER — POTASSIUM CHLORIDE 10 MEQ/50ML IV SOLN
10.0000 meq | INTRAVENOUS | Status: AC
  Administered 2017-04-02 (×2): 10 meq via INTRAVENOUS
  Filled 2017-04-02: qty 50

## 2017-04-02 MED ORDER — MORPHINE SULFATE (PF) 4 MG/ML IV SOLN
4.0000 mg | INTRAVENOUS | Status: DC | PRN
  Administered 2017-04-02 (×2): 4 mg via INTRAVENOUS
  Filled 2017-04-02 (×2): qty 1

## 2017-04-02 MED ORDER — LIDOCAINE HCL (PF) 1 % IJ SOLN
INTRAMUSCULAR | Status: AC
  Filled 2017-04-02: qty 30

## 2017-04-02 MED ORDER — FENTANYL CITRATE (PF) 100 MCG/2ML IJ SOLN: 25.0000 ug | Freq: Once | INTRAMUSCULAR | Status: AC

## 2017-04-02 MED ORDER — EPINEPHRINE PF 1 MG/ML IJ SOLN
0.5000 ug/min | INTRAVENOUS | Status: DC
  Filled 2017-04-02: qty 4

## 2017-04-02 MED ORDER — MORPHINE SULFATE (PF) 4 MG/ML IV SOLN: 4.0000 mg | INTRAVENOUS | Status: DC | PRN

## 2017-04-02 MED ORDER — LORAZEPAM 2 MG/ML IJ SOLN
0.5000 mg | Freq: Once | INTRAMUSCULAR | Status: AC
  Administered 2017-04-02: 0.5 mg via INTRAVENOUS

## 2017-04-02 MED ORDER — AMIODARONE HCL IN DEXTROSE 360-4.14 MG/200ML-% IV SOLN
INTRAVENOUS | Status: AC
  Administered 2017-04-02: 150 mg via INTRAVENOUS
  Filled 2017-04-02: qty 200

## 2017-04-02 MED ORDER — POTASSIUM CHLORIDE 10 MEQ/50ML IV SOLN
10.0000 meq | INTRAVENOUS | Status: DC | PRN
  Administered 2017-04-02: 10 meq via INTRAVENOUS
  Filled 2017-04-02 (×2): qty 50

## 2017-04-02 MED ORDER — PANTOPRAZOLE SODIUM 40 MG PO TBEC
40.0000 mg | DELAYED_RELEASE_TABLET | Freq: Every day | ORAL | Status: DC
  Administered 2017-04-03 – 2017-04-10 (×7): 40 mg via ORAL
  Filled 2017-04-02 (×7): qty 1

## 2017-04-02 MED ORDER — LORAZEPAM 2 MG/ML IJ SOLN
INTRAMUSCULAR | Status: AC
  Filled 2017-04-02: qty 1

## 2017-04-02 MED ORDER — FENTANYL CITRATE (PF) 100 MCG/2ML IJ SOLN
INTRAMUSCULAR | Status: AC
  Administered 2017-04-02: 25 ug
  Filled 2017-04-02: qty 2

## 2017-04-02 MED ORDER — MILRINONE LACTATE IN DEXTROSE 20-5 MG/100ML-% IV SOLN
0.1250 ug/kg/min | INTRAVENOUS | Status: DC
  Administered 2017-04-02 – 2017-04-05 (×4): 0.25 ug/kg/min via INTRAVENOUS
  Filled 2017-04-02 (×7): qty 100

## 2017-04-02 MED ORDER — AMIODARONE LOAD VIA INFUSION
150.0000 mg | Freq: Once | INTRAVENOUS | Status: AC
  Administered 2017-04-02: 150 mg via INTRAVENOUS
  Filled 2017-04-02: qty 83.34

## 2017-04-02 NOTE — Progress Notes (Signed)
CT surgery p.m. Rounds  Spontaneous right pneumothorax after pleural tube removed earlier today treated with pigtail catheter Chest x-ray shows full reexpansion right lung Currently no air leak through Pleur-evac on suction Sinus rhythm on IV amiodarone Surgical incisions clean and dry

## 2017-04-02 NOTE — Anesthesia Preprocedure Evaluation (Signed)
Anesthesia Evaluation  Patient identified by MRN, date of birth, ID band Patient awake    Reviewed: Allergy & Precautions, NPO status , Patient's Chart, lab work & pertinent test results  Airway Mallampati: II  TM Distance: >3 FB Neck ROM: Full    Dental  (+) Teeth Intact   Pulmonary     + decreased breath sounds      Cardiovascular  Rhythm:Regular Rate:Normal     Neuro/Psych    GI/Hepatic   Endo/Other  diabetes  Renal/GU      Musculoskeletal   Abdominal   Peds  Hematology   Anesthesia Other Findings   Reproductive/Obstetrics                             Anesthesia Physical Anesthesia Plan  ASA: IV  Anesthesia Plan: General   Post-op Pain Management:    Induction: Intravenous  PONV Risk Score and Plan: 1 and Ondansetron and Dexamethasone  Airway Management Planned: Oral ETT  Additional Equipment: Arterial line, 3D TEE, PA Cath and CVP  Intra-op Plan:   Post-operative Plan: Possible Post-op intubation/ventilation  Informed Consent: I have reviewed the patients History and Physical, chart, labs and discussed the procedure including the risks, benefits and alternatives for the proposed anesthesia with the patient or authorized representative who has indicated his/her understanding and acceptance.     Plan Discussed with: CRNA and Anesthesiologist  Anesthesia Plan Comments:         Anesthesia Quick Evaluation

## 2017-04-02 NOTE — Progress Notes (Signed)
PT Cancellation Note  Patient Details Name: Luis [name] MRN: [id number] DOB: 1945/10/11   Cancelled Treatment:    Reason Eval/Treat Not Completed: Medical issues which prohibited therapy (Nurse pulled chest tube and pt with SOB.  MD in room reinserting chest tube upon PT arrival.) Will return tomorrow and progress as able.   Godfrey Pick Rorey Bisson 04/02/2017, 1:09 PM Malayja Freund,PT Acute Rehabilitation [phone]

## 2017-04-02 NOTE — Progress Notes (Signed)
Removed pt chest tubes, PT 1 and PT 2 per order.  Pt became dyspneic and was holding breath, O2 saturation low 90s, pt HR in 140s-afib with RVR.  O2 applied, called MD orders received for stat chest xray, amiodarone bolus and infusion.

## 2017-04-02 NOTE — Progress Notes (Addendum)
Advanced Heart Failure Rounding Note  Primary Cardiologist: Dr [name]  Subjective:    S/p emergent CABG x2 and Impella placement 03/25/17 for LM dissection  Extubated 6/29.  Impella removed in OR 7/3. EF 30-35% in OR RV mild HK   Now extubated.  Coox 71.5% this am on milrinone 0.375 mcg/kg/min. CVP 6  Tmax 99.9.  Feeling Ok this am. No SOB. Still mildly sore in his chest. No lightheadedness or dizziness.   CXR this am with mild edema with some improvement (viewed personally). On ceftaz for ? Aspiration   Objective:   Weight Range: 192 lb 0.3 oz (87.1 kg) Body mass index is 29.63 kg/m.   Vital Signs:   Temp:  [98.4 F (36.9 C)-99.9 F (37.7 C)] 99.1 F (37.3 C) (07/05 0739) Pulse Rate:  [63-103] 93 (07/05 0700) Resp:  [16-27] 27 (07/05 0700) BP: (79-124)/(48-69) 113/57 (07/05 0700) SpO2:  [95 %-99 %] 97 % (07/05 0700) Arterial Line BP: (112-162)/(39-57) 118/44 (07/05 0700) Weight:  [192 lb 0.3 oz (87.1 kg)] 192 lb 0.3 oz (87.1 kg) (07/05 0600) Last BM Date: 04/02/17  Weight change: Filed Weights   03/31/17 0500 04/01/17 0530 04/02/17 0600  Weight: 187 lb 9.8 oz (85.1 kg) 197 lb 15.6 oz (89.8 kg) 192 lb 0.3 oz (87.1 kg)    Intake/Output:   Intake/Output Summary (Last 24 hours) at 04/02/17 0758 Last data filed at 04/02/17 0700  Gross per 24 hour  Intake          1129.49 ml  Output             2217 ml  Net         -1087.51 ml      Physical Exam   General: Fatigued. NAD HEENT: Normal Neck: Supple. JVP 6-7 cm. R Mayking TLC. Carotids 2+ bilat; no bruits. No thyromegaly or nodule noted. Cor: PMI nondisplaced. Distant, regular. Sternotomy C/D/I.  Lungs: CTAB, normal effort. Abdomen: Soft, non-tender, non-distended, no HSM. No bruits or masses. +BS  Extremities: No cyanosis, clubbing, rash, R and LLE no edema.  Neuro: Alert & orientedx3, cranial nerves grossly intact. moves all 4 extremities w/o difficulty. Affect pleasant    Telemetry   Personally  reviewed, NSR 70-80s   EKG    N/A  Labs    CBC  Recent Labs  04/01/17 0400 04/01/17 1642 04/02/17 0415  WBC 10.2  --  10.8*  HGB 8.7* 10.5* 9.2*  HCT 26.4* 31.0* 28.2*  MCV 87.7  --  88.4  PLT 127*  --  211   Basic Metabolic Panel  Recent Labs  04/01/17 0400 04/01/17 1642 04/02/17 0415  NA 134* 135 136  K 3.9 3.5 3.5  CL 103 102 106  CO2 19*  --  22  GLUCOSE 114* 94 78  BUN 19 20 17   CREATININE 1.17 0.90 1.05  CALCIUM 7.7*  --  7.8*  MG 2.2  --  2.2   Liver Function Tests  Recent Labs  04/01/17 0400 04/02/17 0415  AST 26 21  ALT 23 19  ALKPHOS 66 63  BILITOT 1.6* 1.4*  PROT 5.1* 5.3*  ALBUMIN 2.6* 2.7*   No results for input(s): LIPASE, AMYLASE in the last 72 hours. Cardiac Enzymes No results for input(s): CKTOTAL, CKMB, CKMBINDEX, TROPONINI in the last 72 hours.  BNP: BNP (last 3 results) No results for input(s): BNP in the last 8760 hours.  ProBNP (last 3 results) No results for input(s): PROBNP in the last 8760 hours.  D-Dimer No results for input(s): DDIMER in the last 72 hours. Hemoglobin A1C No results for input(s): HGBA1C in the last 72 hours. Fasting Lipid Panel No results for input(s): CHOL, HDL, LDLCALC, TRIG, CHOLHDL, LDLDIRECT in the last 72 hours. Thyroid Function Tests No results for input(s): TSH, T4TOTAL, T3FREE, THYROIDAB in the last 72 hours.  Invalid input(s): FREET3  Other results:   Imaging    Dg Chest Port 1 View  Result Date: 04/02/2017 CLINICAL DATA:  Chest tubes. EXAM: PORTABLE CHEST 1 VIEW COMPARISON:  04/01/2017. FINDINGS: Interim removal of Swan-Ganz catheter and mediastinal drainage catheter. Bilateral chest tubes in stable position. Right subclavian line in stable position. Prior CABG. Mild pulmonary venous congestion Interim clearing of basilar atelectasis and edema with mild residual subsegmental atelectasis. Small left pleural effusion. No pneumothorax . IMPRESSION: 1.Interim removal Swan-Ganz catheter  mediastinal drainage catheter. Bilateral chest tubes in stable position. Right subclavian line stable position. No pneumothorax. 2. Prior CABG.  Mild pulmonary venous congestion . 3. Interim clearing of basilar atelectasis and edema with mild residual subsegmental atelectasis. Electronically Signed   By: Marcello Moores  Register   On: 04/02/2017 07:17     Medications:     Scheduled Medications: . amiodarone  200 mg Oral BID  . aspirin EC  325 mg Oral Daily   Or  . aspirin  324 mg Per Tube Daily  . bisacodyl  10 mg Oral Daily   Or  . bisacodyl  10 mg Rectal Daily  . Chlorhexidine Gluconate Cloth  6 each Topical Daily  . digoxin  0.125 mg Oral Daily  . docusate sodium  200 mg Oral Daily  . enoxaparin (LOVENOX) injection  30 mg Subcutaneous Q24H  . furosemide  40 mg Intravenous Daily  . insulin aspart  0-24 Units Subcutaneous Q4H  . insulin detemir  24 Units Subcutaneous BID  . pantoprazole (PROTONIX) IV  40 mg Intravenous Q24H  . rosuvastatin  40 mg Oral q1800  . sodium bicarbonate  25 mEq Intravenous Once  . sodium chloride flush  10-40 mL Intracatheter Q12H  . sodium chloride flush  3 mL Intravenous Q12H  . spironolactone  12.5 mg Oral Daily    Infusions: . sodium chloride 10 mL/hr at 03/31/17 0600  . sodium chloride    . sodium chloride Stopped (03/30/17 1300)  . cefTAZidime (FORTAZ)  IV Stopped (04/02/17 0230)  . dexmedetomidine (PRECEDEX) IV infusion 0.2 mcg/kg/hr (03/31/17 1200)  . lactated ringers 10 mL/hr at 03/31/17 0600  . milrinone 0.375 mcg/kg/min (04/02/17 0700)  . potassium chloride 10 mEq (04/02/17 0751)    PRN Medications: sodium chloride, fentaNYL (SUBLIMAZE) injection, levalbuterol, ondansetron (ZOFRAN) IV, oxyCODONE, phenol, potassium chloride, sodium chloride flush, sodium chloride flush, traMADol    Patient Profile   Luis [name] is 23 year admitted after LHC complicated by Left main dissection, acute MI, cardiogenic shock requiring urgent CABG x2 with impella  5.0 LD on 03/25/17  Assessment/Plan    1. Cardiogenic Shock/acute systolic heart failure in the setting of acute MI due to LM dissection - s/p emergent CABG 03/25/17 with insertion Impella LD 5.0  - s/p Impella removal 7/3 - EF 30-35% by intra-op TEE on 7/3 - Volume status improved. CVP 6 this am. Down 5 lbs. But remains up 5 from baseline. Creatinine stable. Will give am dose of IV lasix then stop. Consider po in am.  - Coox 71% on milrinone 0.375 mcg/kg/min.  Will decrease to 0.25 mcg/kg/min.   - Continue spiro 12.5 mg daily -  Continue digoxin 0.125 mg daily.  - No BB yet. Losartan vs entresto soon.   2. 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 - s/p CABG. No s/s ischemia - Continue ASA and statin. No change.   3. Acute respiratory failure - Extubated 6/29.  - Re-intubated in OR on 7/3. Now extubated - CXR this am with slowly improving CHF. Weight shows down 5 lbs. Continue gentle diuresis.   4. Acute blood loss anemia - Transfused 2u RBCs on 6/30. - Hgb 9.2 this am. Low threshold to transfuse  5. AKI - Resolved. Continue to follow.   6. Hypokalemia/Hypomag - K 3.5 and Mg 2.2 today. Continue to follow. Potassium supped via runs this am.   - Continue spiro 12.5  7. ID - On ceftaz for ? Aspiration  - Tmax 99.9  8. PAF, post-op (transient) - now in NSR. amio switched to po. - Continue amio 200 mg po BID.   Length of Stay: [address]  Annamaria Helling  04/02/2017, 7:58 AM  Advanced Heart Failure Team Pager [phone] (M-F; 7a - 4p)  Please contact [facility] for night-coverage after hours (4p -7a ) and weekends on [email or web address]  Patient seen and examined with the above-signed Advanced Practice Provider and/or Housestaff. I personally reviewed laboratory data, imaging studies and relevant notes. I independently examined the patient and formulated the important aspects of the plan. I have edited the note to reflect any of my changes or salient points. I have personally  discussed the plan with the patient and/or family.  Improved this am. Co-ox better. Will cut milrinone down to 0.25. CVP 6 but weight still up.Will diurese gently. Likely can start losartan today or tomorrow. Will mobilize.   Luis Bickers, MD  12:13 PM

## 2017-04-02 NOTE — Progress Notes (Signed)
  Paged for acute decompensation s/p chest tube removal.  STAT CXR ordered which showed marked L side edema and R side pneumothorax with concerns for developing tension PTX.   Sats initially down in 80s but improved to 100s on NRB. RR 35-40. BP stable in 627 range systolic. HR increased to 110-120 and noted to be in Afib with RVR.   Dr. Haroldine Laws arrived and surgeon notified for stat replacement of chest tube.   Legrand Como [address]" [place], PA-C 04/02/2017 11:46 AM   Called to see patient for severe respiratory distress after chest tube pulled.   Developed hypoxemia into mid 80s and AF with RVR.Marland Kitchen Place on high flow O2 with sats in 90s.  On exam/ Patient visibly uncomfortable. Respiratory rate 35-40.  Cor IRR tachy. No breath sounds right upper lobe  Stat CXR confirmed large R-PTX with component of tension.  Case D/w Dr. PVT who was in OR. Dr. Servando Snare contact who came emergently and placed R chest tube at bedside with resolution of PTX.   Patient reverted to NSR.   CRITICAL CARE Performed by: Glori Bickers  Total critical care time: 40 minutes  Critical care time was exclusive of separately billable procedures and treating other patients.  Critical care was necessary to treat or prevent imminent or life-threatening deterioration.  Critical care was time spent personally by me (independent of midlevel providers or residents) on the following activities: development of treatment plan with patient and/or surrogate as well as nursing, discussions with consultants, evaluation of patient's response to treatment, examination of patient, obtaining history from patient or surrogate, ordering and performing treatments and interventions, ordering and review of laboratory studies, ordering and review of radiographic studies, pulse oximetry and re-evaluation of patient's condition.  Glori Bickers, MD  9:18 PM

## 2017-04-02 NOTE — Anesthesia Postprocedure Evaluation (Signed)
Anesthesia Post Note  Patient: Administrator, arts  Procedure(s) Performed: Procedure(s) (LRB): REMOVAL OF IMPELLA LEFT VENTRICULAR ASSIST DEVICE (N/A) TRANSESOPHAGEAL ECHOCARDIOGRAM (TEE) (N/A)     Patient location during evaluation: SICU Anesthesia Type: General Level of consciousness: awake and oriented Pain management: pain level controlled Vital Signs Assessment: post-procedure vital signs reviewed and stable Respiratory status: spontaneous breathing, nonlabored ventilation and respiratory function stable Cardiovascular status: blood pressure returned to baseline Anesthetic complications: no    Last Vitals:  Vitals:   04/02/17 0400 04/02/17 0500  BP: 116/60 (!) 116/59  Pulse: 91 89  Resp: 16 (!) 25  Temp:      Last Pain:  Vitals:   04/02/17 0500  TempSrc:   PainSc: Asleep                 Willetta York COKER

## 2017-04-02 NOTE — Procedures (Signed)
      [address]       [place] [address]             [phone]    Called stat to ICU with patient in acute respiratory distress with right tension pneumothorax, emergency right chest tube placed after confirming patient name and laterality     Chest Tube Insertion Procedure Note  Indications:  Clinically significant Pneumothorax tension on right  Pre-operative Diagnosis: Pneumothorax  Post-operative Diagnosis: Pneumothorax  Procedure Details  Informed consent was obtained for the procedure, including sedation.  Risks of lung perforation, hemorrhage, arrhythmia, and adverse drug reaction were discussed.   After sterile skin prep, using standard technique, a 14 French tube was placed in the right lateral 6 rib space.  Findings: Air and 600 mol of serous fluid   Estimated Blood Loss:  Minimal         Specimens:  None              Complications:  None; patient tolerated the procedure well.         Disposition: ICU - extubated and stable.         Condition: stable  Attending Attestation: I performed the procedure.

## 2017-04-02 NOTE — Progress Notes (Signed)
2 Days Post-Op Procedure(s) (LRB): REMOVAL OF IMPELLA LEFT VENTRICULAR ASSIST DEVICE (N/A) TRANSESOPHAGEAL ECHOCARDIOGRAM (TEE) (N/A) Subjective: Doing well Wean milrinone co-ox > 70% DCchest tubes Objective: Vital signs in last 24 hours: Temp:  [98.4 F (36.9 C)-99.9 F (37.7 C)] 99.1 F (37.3 C) (07/05 0739) Pulse Rate:  [63-103] 93 (07/05 0700) Cardiac Rhythm: Normal sinus rhythm (07/05 0500) Resp:  [16-27] 27 (07/05 0700) BP: (79-124)/(48-69) 113/57 (07/05 0700) SpO2:  [95 %-99 %] 97 % (07/05 0700) Arterial Line BP: (112-162)/(39-57) 118/44 (07/05 0700) Weight:  [192 lb 0.3 oz (87.1 kg)] 192 lb 0.3 oz (87.1 kg) (07/05 0600)  Hemodynamic parameters for last 24 hours: PAP: (33)/(16) 33/16 CVP:  [8 mmHg-10 mmHg] 8 mmHg  Intake/Output from previous day: 07/04 0701 - 07/05 0700 In: 1205.4 [P.O.:120; I.V.:480.4; IV Piggyback:500] Out: 2217 [Urine:2175; Helen; Chest Tube:40] Intake/Output this shift: No intake/output data recorded.       Exam    General- alert and comfortable   Lungs- clear without rales, wheezes   Cor- regular rate and rhythm, no murmur , gallop   Abdomen- soft, non-tender   Extremities - warm, non-tender, minimal edema   Neuro- oriented, appropriate, no focal weakness   Lab Results:  Recent Labs  04/01/17 0400 04/01/17 1642 04/02/17 0415  WBC 10.2  --  10.8*  HGB 8.7* 10.5* 9.2*  HCT 26.4* 31.0* 28.2*  PLT 127*  --  204   BMET:  Recent Labs  04/01/17 0400 04/01/17 1642 04/02/17 0415  NA 134* 135 136  K 3.9 3.5 3.5  CL 103 102 106  CO2 19*  --  22  GLUCOSE 114* 94 78  BUN 19 20 17   CREATININE 1.17 0.90 1.05  CALCIUM 7.7*  --  7.8*    PT/INR: No results for input(s): LABPROT, INR in the last 72 hours. ABG    Component Value Date/Time   PHART 7.535 (H) 04/02/2017 0429   HCO3 20.7 04/02/2017 0429   TCO2 21 04/02/2017 0429   ACIDBASEDEF 1.0 04/02/2017 0429   O2SAT 71.5 04/02/2017 0433   CBG (last 3)   Recent Labs  04/01/17 2302 04/02/17 0330 04/02/17 0741  GLUCAP 80 83 81    Assessment/Plan: S/P Procedure(s) (LRB): REMOVAL OF IMPELLA LEFT VENTRICULAR ASSIST DEVICE (N/A) TRANSESOPHAGEAL ECHOCARDIOGRAM (TEE) (N/A) Mobilize Diuresis Diabetes control d/c tubes/lines supp potassium   LOS: 8 days    Luis [name] 04/02/2017

## 2017-04-03 ENCOUNTER — Other Ambulatory Visit: Payer: Self-pay

## 2017-04-03 ENCOUNTER — Inpatient Hospital Stay (HOSPITAL_COMMUNITY): Payer: Medicare Other

## 2017-04-03 LAB — BPAM RBC
Blood Product Expiration Date: 201807102359
Blood Product Expiration Date: 201807112359
Blood Product Expiration Date: 201807112359
Blood Product Expiration Date: 201807112359
Blood Product Expiration Date: 201807182359
ISSUE DATE / TIME: 201807030804
ISSUE DATE / TIME: 201807030804
ISSUE DATE / TIME: 201807030804
ISSUE DATE / TIME: 201807030804
ISSUE DATE / TIME: 201807031324
Unit Type and Rh: 6200
Unit Type and Rh: 6200
Unit Type and Rh: 6200
Unit Type and Rh: 6200
Unit Type and Rh: 6200

## 2017-04-03 LAB — TYPE AND SCREEN
ABO/RH(D): A POS
Antibody Screen: NEGATIVE
Unit division: 0
Unit division: 0
Unit division: 0
Unit division: 0
Unit division: 0

## 2017-04-03 LAB — COOXEMETRY PANEL
Carboxyhemoglobin: 1.5 % (ref 0.5–1.5)
Methemoglobin: 0.8 % (ref 0.0–1.5)
O2 Saturation: 52.8 %
Total hemoglobin: 9.9 g/dL — ABNORMAL LOW (ref 12.0–16.0)

## 2017-04-03 LAB — CBC
HCT: 28.9 % — ABNORMAL LOW (ref 39.0–52.0)
Hemoglobin: 9.1 g/dL — ABNORMAL LOW (ref 13.0–17.0)
MCH: 28.1 pg (ref 26.0–34.0)
MCHC: 31.5 g/dL (ref 30.0–36.0)
MCV: 89.2 fL (ref 78.0–100.0)
Platelets: 275 10*3/uL (ref 150–400)
RBC: 3.24 MIL/uL — ABNORMAL LOW (ref 4.22–5.81)
RDW: 16.7 % — ABNORMAL HIGH (ref 11.5–15.5)
WBC: 11.6 10*3/uL — ABNORMAL HIGH (ref 4.0–10.5)

## 2017-04-03 LAB — COMPREHENSIVE METABOLIC PANEL
ALT: 19 U/L (ref 17–63)
AST: 20 U/L (ref 15–41)
Albumin: 2.8 g/dL — ABNORMAL LOW (ref 3.5–5.0)
Alkaline Phosphatase: 60 U/L (ref 38–126)
Anion gap: 6 (ref 5–15)
BUN: 21 mg/dL — ABNORMAL HIGH (ref 6–20)
CO2: 24 mmol/L (ref 22–32)
Calcium: 8.1 mg/dL — ABNORMAL LOW (ref 8.9–10.3)
Chloride: 103 mmol/L (ref 101–111)
Creatinine, Ser: 1.18 mg/dL (ref 0.61–1.24)
GFR calc Af Amer: >60 mL/min (ref >60)
GFR calc non Af Amer: >60 mL/min (ref >60)
Glucose, Bld: 116 mg/dL — ABNORMAL HIGH (ref 65–99)
Potassium: 4.2 mmol/L (ref 3.5–5.1)
Sodium: 133 mmol/L — ABNORMAL LOW (ref 135–145)
Total Bilirubin: 0.9 mg/dL (ref 0.3–1.2)
Total Protein: 5.5 g/dL — ABNORMAL LOW (ref 6.5–8.1)

## 2017-04-03 LAB — GLUCOSE, CAPILLARY
Glucose-Capillary: 111 mg/dL — ABNORMAL HIGH (ref 65–99)
Glucose-Capillary: 100 mg/dL — ABNORMAL HIGH (ref 65–99)
Glucose-Capillary: 95 mg/dL (ref 65–99)
Glucose-Capillary: 87 mg/dL (ref 65–99)
Glucose-Capillary: 105 mg/dL — ABNORMAL HIGH (ref 65–99)

## 2017-04-03 LAB — POCT I-STAT, CHEM 8
BUN: 21 mg/dL — ABNORMAL HIGH (ref 6–20)
Calcium, Ion: 1.1 mmol/L — ABNORMAL LOW (ref 1.15–1.40)
Chloride: 97 mmol/L — ABNORMAL LOW (ref 101–111)
Creatinine, Ser: 1.1 mg/dL (ref 0.61–1.24)
Glucose, Bld: 77 mg/dL (ref 65–99)
HCT: 27 % — ABNORMAL LOW (ref 39.0–52.0)
Hemoglobin: 9.2 g/dL — ABNORMAL LOW (ref 13.0–17.0)
Potassium: 4.1 mmol/L (ref 3.5–5.1)
Sodium: 135 mmol/L (ref 135–145)
TCO2: 25 mmol/L (ref 0–100)

## 2017-04-03 LAB — MAGNESIUM: Magnesium: 2.3 mg/dL (ref 1.7–2.4)

## 2017-04-03 MED ORDER — FUROSEMIDE 10 MG/ML IJ SOLN
40.0000 mg | Freq: Once | INTRAMUSCULAR | Status: AC
  Administered 2017-04-03: 40 mg via INTRAVENOUS
  Filled 2017-04-03: qty 4

## 2017-04-03 MED ORDER — GABAPENTIN 100 MG PO CAPS
200.0000 mg | ORAL_CAPSULE | Freq: Three times a day (TID) | ORAL | Status: DC
  Administered 2017-04-03 – 2017-04-10 (×21): 200 mg via ORAL
  Filled 2017-04-03 (×22): qty 2

## 2017-04-03 MED ORDER — FENTANYL CITRATE (PF) 100 MCG/2ML IJ SOLN
25.0000 ug | INTRAMUSCULAR | Status: DC | PRN
  Administered 2017-04-04: 25 ug via INTRAVENOUS
  Filled 2017-04-03 (×2): qty 2

## 2017-04-03 MED FILL — Heparin Sodium (Porcine) Inj 1000 Unit/ML: INTRAMUSCULAR | Qty: 30 | Status: AC

## 2017-04-03 MED FILL — Sodium Chloride IV Soln 0.9%: INTRAVENOUS | Qty: 1000 | Status: AC

## 2017-04-03 NOTE — Progress Notes (Addendum)
Physical Therapy Treatment Patient Details Name: Luis [name] MRN: [id number] DOB: April 05, 1946 Today's Date: 04/03/2017    History of Present Illness Luis [name] is 56 year admitted after LHC complicated by Left main dissection, acute MI, cardiogenic shock requiring urgent CABG x2 with impella.  Extubated 7/3 with Impella removed as well. Spontaneous PTX s/p chest tube removeal 04/02/17.Marland Kitchen     PT Comments    Pt admitted with above diagnosis. Pt currently with functional limitations due to the deficits listed below (see PT Problem List). Pt was able to ambulate in hall but limited by incr pain chest tube site.  Nurse brought pain meds and pt walked x2 with a rest break in sitting in between walks.  Min assist overall.  Will continue acute PT.   Pt will benefit from skilled PT to increase their independence and safety with mobility to allow discharge to the venue listed below.     Follow Up Recommendations  SNF;Supervision/Assistance - 24 hour     Equipment Recommendations  Other (comment) (TBA)    Recommendations for Other Services OT consult     Precautions / Restrictions Precautions Precautions: Fall;Sternal    Mobility  Bed Mobility               General bed mobility comments: in chair on arrival  Transfers Overall transfer level: Needs assistance Equipment used: Rolling walker (2 wheeled) Transfers: Sit to/from Stand Sit to Stand: Min assist;+2 safety/equipment         General transfer comment: Pt able to stand placing hands on knees.  Pt did well with cues for hand placement.  Ambulation/Gait Ambulation/Gait assistance: +2 safety/equipment;Min assist Ambulation Distance (Feet): 90 Feet (45 feet x 2) Assistive device: Rolling walker (2 wheeled) Gait Pattern/deviations: Step-through pattern;Decreased stride length;Decreased step length - right;Decreased weight shift to right;Leaning posteriorly;Drifts right/left;Trunk flexed;Wide base of support   Gait velocity  interpretation: Below normal speed for age/gender General Gait Details: Pt was able to ambulate with RW with cues to stay close to RW and assist to steer RW.  Pt leaning posterior slightly needing min assist at times to right balance.  Pt keeps right LE turned out.  Folllowed pt with chair with pt needing one seated rest break due to desats and fatigued.     Stairs            Wheelchair Mobility    Modified Rankin (Stroke Patients Only)       Balance Overall balance assessment: Needs assistance Sitting-balance support: No upper extremity supported;Feet supported Sitting balance-Leahy Scale: Fair   Postural control: Posterior lean Standing balance support: Bilateral upper extremity supported;During functional activity Standing balance-Leahy Scale: Poor Standing balance comment: relies on RW for balance                            Cognition Arousal/Alertness: Awake/alert Behavior During Therapy: Flat affect Overall Cognitive Status: Within Functional Limits for tasks assessed                                        Exercises General Exercises - Lower Extremity Long Arc Quad: AROM;Both;10 reps;Seated    General Comments General comments (skin integrity, edema, etc.): Pt on 1.5L at rest. Had to incr O2 to 3L to keep sats >90% as he drops to 87-89% on 1.5LO2 with activity.  Other VSS  Pertinent Vitals/Pain Pain Assessment: Faces Faces Pain Scale: Hurts worst Pain Location: chest tube site Pain Descriptors / Indicators: Grimacing;Guarding;Aching Pain Intervention(s): Limited activity within patient's tolerance;Monitored during session;Repositioned;RN gave pain meds during session;Patient requesting pain meds-RN notified    Home Living                      Prior Function            PT Goals (current goals can now be found in the care plan section) Progress towards PT goals: Progressing toward goals    Frequency    Min  3X/week      PT Plan Current plan remains appropriate    Co-evaluation              AM-PAC PT "6 Clicks" Daily Activity  Outcome Measure  Difficulty turning over in bed (including adjusting bedclothes, sheets and blankets)?: Total Difficulty moving from lying on back to sitting on the side of the bed? : Total Difficulty sitting down on and standing up from a chair with arms (e.g., wheelchair, bedside commode, etc,.)?: Total Help needed moving to and from a bed to chair (including a wheelchair)?: A Lot Help needed walking in hospital room?: A Lot Help needed climbing 3-5 steps with a railing? : Total 6 Click Score: 8    End of Session Equipment Utilized During Treatment: Gait belt;Oxygen Activity Tolerance: Patient limited by fatigue;Patient limited by pain Patient left: in chair;with call bell/phone within reach;with family/visitor present Nurse Communication: Mobility status;Patient requests pain meds PT Visit Diagnosis: Unsteadiness on feet (R26.81);Muscle weakness (generalized) (M62.81);Pain Pain - part of body:  (chest tube site)     Time: 9371-6967 PT Time Calculation (min) (ACUTE ONLY): 17 min  Charges:  $Gait Training: 8-22 mins                    G Codes:       Luis [name],PT Acute Rehabilitation [phone] (pager)    Luis [name] 04/03/2017, 2:51 PM

## 2017-04-03 NOTE — Progress Notes (Signed)
Advanced Heart Failure Rounding Note  Primary Cardiologist: Dr [name]  Subjective:    S/p emergent CABG x2 and Impella placement 03/25/17 for LM dissection  Impella removed in OR 7/3. EF 30-35% in OR RV mild HK   Developed acute resp distress with removal of chest tube 04/02/17. STAT CXR with large pneumothorax with developing tension.  Dr. Madolyn Frieze emergently placed pigtail  with resolution.   CXR this am with no residual PTX (Personally viewed). On ceftaz for ? Aspiration   Coox 52.8% this am on milrinone 0.25 mcg/kg/min. CVP 13-14 this am. Tmax 99.6   Breathing much better this am. Still sore at new chest tube site.  Walked a little this morning but stopped due to chest pain..    Objective:   Weight Range: 190 lb 4.1 oz (86.3 kg) Body mass index is 29.36 kg/m.   Vital Signs:   Temp:  [98 F (36.7 C)-99.6 F (37.6 C)] 98.6 F (37 C) (07/06 0754) Pulse Rate:  [78-99] 91 (07/06 0754) Resp:  [14-38] 24 (07/06 0754) BP: (94-146)/(55-85) 104/59 (07/06 0754) SpO2:  [94 %-100 %] 94 % (07/06 0754) Weight:  [190 lb 4.1 oz (86.3 kg)] 190 lb 4.1 oz (86.3 kg) (07/06 0500) Last BM Date: 04/02/17  Weight change: Filed Weights   04/01/17 0530 04/02/17 0600 04/03/17 0500  Weight: 197 lb 15.6 oz (89.8 kg) 192 lb 0.3 oz (87.1 kg) 190 lb 4.1 oz (86.3 kg)    Intake/Output:   Intake/Output Summary (Last 24 hours) at 04/03/17 0820 Last data filed at 04/03/17 0600  Gross per 24 hour  Intake          1125.09 ml  Output              480 ml  Net           645.09 ml      Physical Exam   CVP 13-14 with respiratory variability General: Fatigued. NAD at rest.  HEENT: Normal Neck: Supple. JVP elevated. Carotids 2+ bilat; no bruits. No thyromegaly or nodule noted. Cor: PMI nondisplaced. RRR, Sternotomy C/D/I Lungs: CTAB, normal effort. Abdomen: Soft, non-tender, non-distended, no HSM. No bruits or masses. +BS  Extremities: No cyanosis, clubbing, rash, R and LLE no edema.  Neuro:  Alert & orientedx3, cranial nerves grossly intact. moves all 4 extremities w/o difficulty. Affect flat  Telemetry   Personally reviewed, NSR 90s -> In and out of Afib over night.     EKG    Personally reviewed am of 04/03/17. X 2 show NSR in 90s  Labs    CBC  Recent Labs  04/02/17 0415 04/02/17 1657 04/03/17 0329  WBC 10.8*  --  11.6*  HGB 9.2* 9.5* 9.1*  HCT 28.2* 28.0* 28.9*  MCV 88.4  --  89.2  PLT 204  --  161   Basic Metabolic Panel  Recent Labs  04/02/17 0415 04/02/17 1657 04/03/17 0329  NA 136 137 133*  K 3.5 4.6 4.2  CL 106 101 103  CO2 22  --  24  GLUCOSE 78 109* 116*  BUN 17 20 21*  CREATININE 1.05 0.90 1.18  CALCIUM 7.8*  --  8.1*  MG 2.2  --  2.3   Liver Function Tests  Recent Labs  04/02/17 0415 04/03/17 0329  AST 21 20  ALT 19 19  ALKPHOS 63 60  BILITOT 1.4* 0.9  PROT 5.3* 5.5*  ALBUMIN 2.7* 2.8*   No results for input(s): LIPASE, AMYLASE in the last  72 hours. Cardiac Enzymes No results for input(s): CKTOTAL, CKMB, CKMBINDEX, TROPONINI in the last 72 hours.  BNP: BNP (last 3 results) No results for input(s): BNP in the last 8760 hours.  ProBNP (last 3 results) No results for input(s): PROBNP in the last 8760 hours.   D-Dimer No results for input(s): DDIMER in the last 72 hours. Hemoglobin A1C No results for input(s): HGBA1C in the last 72 hours. Fasting Lipid Panel No results for input(s): CHOL, HDL, LDLCALC, TRIG, CHOLHDL, LDLDIRECT in the last 72 hours. Thyroid Function Tests No results for input(s): TSH, T4TOTAL, T3FREE, THYROIDAB in the last 72 hours.  Invalid input(s): FREET3  Other results:   Imaging    Dg Chest 1 View  Result Date: 04/02/2017 CLINICAL DATA:  71 year old male with a history of coronary artery bypass grafting. Interval removal of thoracostomy tubes. EXAM: CHEST 1 VIEW COMPARISON:  None. FINDINGS: Interval development of right pneumothorax status post removal of the thoracostomy tube. Using Collins  method  % = 4.2 + 4.7 * (A+B+C) where A =1.4cm, B =2.6cm, C = 3.4cm  percentage is estimated 39%. There is slight leftward shift of the tracheal air column, more pronounced than the comparison. Surgical changes of median sternotomy and CABG, with relatively unchanged appearance of the cardiomediastinal silhouette. Interval removal of the left-sided thoracostomy tube with no evidence of left pneumothorax. Epicardial pacing leads may remain. Coarsened interstitial markings on the left, similar to prior. Unchanged right subclavian central venous catheter with the tip appearing to terminate in the superior vena cava. No displaced fracture IMPRESSION: Development of right-sided pneumothorax status post thoracostomy tube removal. There may be developing tension given the leftward shift of the tracheal air column. Percentage pneumothorax estimated 39%. Critical Value/emergent results were called by telephone at the time of interpretation on 04/02/2017 at 11:56 am to the nurse caring for the patient, Ms Venia Carbon, who verbally acknowledged these results and confirmed the plan to replace the thoracostomy tube. Interval removal of left-sided thoracostomy tube, with no visualized pneumothorax. Surgical changes of median sternotomy and CABG. Unchanged right subclavian central venous catheter. Epicardial pacing leads appear to remain. Electronically Signed   By: Corrie Mckusick D.O.   On: 04/02/2017 11:57   Dg Chest Port 1 View  Result Date: 04/03/2017 CLINICAL DATA:  71 year old male status post 2 vessel coronary artery bypass graft EXAM: PORTABLE CHEST 1 VIEW COMPARISON:  Prior chest x-ray 04/02/2017 FINDINGS: Stable position of a right-sided pigtail thoracostomy tube. No definite residual pneumothorax. Right subclavian approach central venous catheter remains in good position with the tip overlying the mid right atrium. Patient is status post median sternotomy with evidence of multivessel CABG. Persistent low inspiratory  volumes with bibasilar atelectasis and perihilar atelectasis. Mild vascular congestion but no overt edema. No acute osseous abnormality. IMPRESSION: 1. No residual pneumothorax with right-sided thoracostomy tube in place. 2. Persistently low inspiratory volumes with bibasilar and perihilar atelectasis. 3. Pulmonary vascular congestion without overt pulmonary edema. Electronically Signed   By: Jacqulynn Cadet M.D.   On: 04/03/2017 07:41   Dg Chest Port 1 View  Result Date: 04/02/2017 CLINICAL DATA:  Status post CABG on March 25, 2017 with ASD repair. Removal of Impella left ventricular assist device 2 days ago. Interval placement of small caliber chest tube on the right for right-sided pneumothorax. EXAM: PORTABLE CHEST 1 VIEW COMPARISON:  Chest x-ray of earlier today FINDINGS: The right lung is nearly totally re- expanded. A tiny amount of air is seen still in the  apex. The mediastinal shift has resolved. The small caliber chest tube overlies the posterior aspect of the right seventh rib. The left lung is also hypoinflated. There is no pneumothorax. The perihilar lung markings remain increased. The heart is top-normal in size. The central pulmonary vascularity is prominent. The right subclavian catheter tip projects over the distal third of the SVC. IMPRESSION: Interval near total re-expansion of the right lung. Both lungs remain hypoinflated. Persistent interstitial prominence greatest on the left likely reflects low-grade pulmonary edema. Electronically Signed   By: David  Martinique M.D.   On: 04/02/2017 12:45     Medications:     Scheduled Medications: . amiodarone  200 mg Oral BID  . aspirin EC  325 mg Oral Daily   Or  . aspirin  324 mg Per Tube Daily  . bisacodyl  10 mg Oral Daily   Or  . bisacodyl  10 mg Rectal Daily  . Chlorhexidine Gluconate Cloth  6 each Topical Daily  . digoxin  0.125 mg Oral Daily  . docusate sodium  200 mg Oral Daily  . enoxaparin (LOVENOX) injection  30 mg  Subcutaneous Q24H  . insulin aspart  0-24 Units Subcutaneous Q4H  . insulin detemir  24 Units Subcutaneous BID  . pantoprazole  40 mg Oral Q1200  . rosuvastatin  40 mg Oral q1800  . sodium chloride flush  10-40 mL Intracatheter Q12H  . sodium chloride flush  3 mL Intravenous Q12H  . spironolactone  12.5 mg Oral Daily    Infusions: . sodium chloride 10 mL/hr at 03/31/17 0600  . sodium chloride    . sodium chloride Stopped (03/30/17 1300)  . cefTAZidime (FORTAZ)  IV Stopped (04/03/17 0148)  . milrinone 0.25 mcg/kg/min (04/02/17 2000)  . potassium chloride 10 mEq (04/02/17 0751)    PRN Medications: sodium chloride, levalbuterol, morphine injection, ondansetron (ZOFRAN) IV, oxyCODONE, phenol, potassium chloride, sodium chloride flush, sodium chloride flush, traMADol    Patient Profile   Mr Pepitone is 49 year admitted after LHC complicated by Left main dissection, acute MI, cardiogenic shock requiring urgent CABG x2 with impella 5.0 LD on 03/25/17  Assessment/Plan    1. Cardiogenic Shock/acute systolic heart failure in the setting of acute MI due to LM dissection - s/p emergent CABG 03/25/17 with insertion Impella LD 5.0  - s/p Impella removal 7/3 - EF 30-35% by intra-op TEE on 7/3 - Volume status mildly elevated. CVP 13-14 this am. Possibly related to PAF and decompensation yesterday with PTX. Down another 2 lbs.   - Will give 40 mg IV lasix once.  - Coox 52.8% this am on milrinone 0.25 mcg/kg/min. ? Related to PAF. Check EKG, continue current support for now. Recheck coox this afternoon. If stable can move  - Continue spiro 12.5 mg daily - Continue digoxin 0.125 mg daily.  - No BB yet. Losartan vs entresto soon.   2. 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 - s/p CABG. No s/s ischemia - Continue ASA and statin. No change.   3. Acute respiratory failure - Extubated 6/29.  - Re-intubated in OR on 7/3. Now extubated - s/p PTX yesterday with chest tube removal.  Resolved with  emergent replacement of chest tube by Dr. Servando Snare.  - CXR this am with no residual PTX.  - Weight down 2 more lbs. No urine output recorded.   4. Acute blood loss anemia - Transfused 2u RBCs on 6/30. - Hgb 9.1 this am. Low threshold to transfuse.   5. AKI -  Creatinine 0.90 -> 1.18. Continue to follow.   6. Hypokalemia/Hypomag - K 4.2 and Mg 2.3 today. Continue to follow.  - Continue spiro 12.5  7. ID - Tmax 99.6. Finished course of ceftaz for ? Aspiration.   8. PAF, post-op (transient) - Briefly in Afib RVR yesterday with PTX.  Now in and out overnight with irregularity this am. - NSR this am by EKG. Continue to follow. Control pain.  - Continue amio 200 mg po BID. May need to re-bolus.  Fentanyl ordered for breakthrough pain.   Length of Stay: [place], [place]  04/03/2017, 8:20 AM  Advanced Heart Failure Team Pager [phone] (M-F; 7a - 4p)  Please contact [facility] for night-coverage after hours (4p -7a ) and weekends on [email or web address]  Patient seen and examined with the above-signed Advanced Practice Provider and/or Housestaff. I personally reviewed laboratory data, imaging studies and relevant notes. I independently examined the patient and formulated the important aspects of the plan. I have edited the note to reflect any of my changes or salient points. I have personally discussed the plan with the patient and/or family.  Remains sore this am from PTX. Volume status stable. CXR looks ok (viewed personally). Back in NSR on amio. Volume status and renal function ok. Will given tramadol for pain and can use fentanyl prn. Continue to mobilize. Electrolytes stable.   Glori Bickers, MD  5:00 PM

## 2017-04-03 NOTE — Clinical Social Work Note (Signed)
Clinical Social Work Assessment  Patient Details  Name: Luis [name] MRN: [id number] Date of Birth: [birth year]  Date of referral:  04/03/17               Reason for consult:  Facility Placement                Permission sought to share information with:  Chartered certified accountant granted to share information::  Yes, Verbal Permission Granted  Name::     Madhuben, Payal  Agency::  SNF  Relationship::  wife, dtr  Contact Information:     Housing/Transportation Living arrangements for the past 2 months:  Single Family Home Source of Information:  Patient, Adult Children, Spouse Patient Interpreter Needed:  None Criminal Activity/Legal Involvement Pertinent to Current Situation/Hospitalization:  No - Comment as needed Significant Relationships:  Adult Children, Spouse Lives with:  Spouse Do you feel safe going back to the place where you live?  No Need for family participation in patient care:  No (Coment)  Care giving concerns:  Pt lives at home with wife- some concern from pt dtrs if wife can manage pt alone with current impairment.   Social Worker assessment / plan:  CSW met with pt, pt wife, and two dtrs.  Explained SNF and SNF referral process.    Employment status:  Health visitor PT Recommendations:  [place] / Referral to community resources:  [place]  Patient/Family's Response to care:  Pt and family agreeable to having referral sent out but believes most of current mobility concerns are because of chest tube and feel as if he will be safe to return home after chest tube removal.  Patient/Family's Understanding of and Emotional Response to Diagnosis, Current Treatment, and Prognosis:  Family has good understanding of pt condition and asks appropriate questions.  Pt hopeful he can return home rather than go to SNF.  Emotional Assessment Appearance:  Appears stated  age Attitude/Demeanor/Rapport:    Affect (typically observed):  Appropriate, Accepting Orientation:  Oriented to Self, Oriented to Place, Oriented to  Time, Oriented to Situation Alcohol / Substance use:  Not Applicable Psych involvement (Current and /or in the community):  No (Comment)  Discharge Needs  Concerns to be addressed:  Care Coordination Readmission within the last 30 days:  No Current discharge risk:  Physical Impairment Barriers to Discharge:  Continued Medical Work up   Jorge Ny, LCSW 04/03/2017, 3:49 PM

## 2017-04-03 NOTE — Progress Notes (Addendum)
3 Days Post-Op Procedure(s) (LRB): REMOVAL OF IMPELLA LEFT VENTRICULAR ASSIST DEVICE (N/A) TRANSESOPHAGEAL ECHOCARDIOGRAM (TEE) (N/A) Subjective: Postop R pntx resolved with pig-tail catheter No air leak- leave tube to water seal cxr clear nsr co-ox 55 on .25 mil  Objective: Vital signs in last 24 hours: Temp:  [98 F (36.7 C)-99.6 F (37.6 C)] 98.6 F (37 C) (07/06 0354) Pulse Rate:  [78-99] 81 (07/06 0600) Cardiac Rhythm: Normal sinus rhythm (07/06 0400) Resp:  [14-38] 14 (07/06 0600) BP: (94-146)/(55-85) 105/57 (07/06 0600) SpO2:  [97 %-100 %] 98 % (07/06 0600) Arterial Line BP: (113)/(39) 113/39 (07/05 0800) Weight:  [190 lb 4.1 oz (86.3 kg)] 190 lb 4.1 oz (86.3 kg) (07/06 0500)  Hemodynamic parameters for last 24 hours:  stable  Intake/Output from previous day: 07/05 0701 - 07/06 0700 In: 1125.1 [P.O.:360; I.V.:465.1; IV Piggyback:300] Out: 480 [Chest Tube:480] Intake/Output this shift: No intake/output data recorded.       Exam    General- alert and comfortable   Lungs- clear without rales, wheezes   Cor- regular rate and rhythm, no murmur , gallop   Abdomen- soft, non-tender   Extremities - warm, non-tender, minimal edema   Neuro- oriented, appropriate, no focal weakness   Lab Results:  Recent Labs  04/02/17 0415 04/02/17 1657 04/03/17 0329  WBC 10.8*  --  11.6*  HGB 9.2* 9.5* 9.1*  HCT 28.2* 28.0* 28.9*  PLT 204  --  275   BMET:  Recent Labs  04/02/17 0415 04/02/17 1657 04/03/17 0329  NA 136 137 133*  K 3.5 4.6 4.2  CL 106 101 103  CO2 22  --  24  GLUCOSE 78 109* 116*  BUN 17 20 21*  CREATININE 1.05 0.90 1.18  CALCIUM 7.8*  --  8.1*    PT/INR: No results for input(s): LABPROT, INR in the last 72 hours. ABG    Component Value Date/Time   PHART 7.535 (H) 04/02/2017 0429   HCO3 20.7 04/02/2017 0429   TCO2 22 04/02/2017 1657   ACIDBASEDEF 1.0 04/02/2017 0429   O2SAT 52.8 04/03/2017 0338   CBG (last 3)   Recent Labs   04/02/17 1924 04/02/17 2343 04/03/17 0342  GLUCAP 111* 119* 111*    Assessment/Plan: S/P Procedure(s) (LRB): REMOVAL OF IMPELLA LEFT VENTRICULAR ASSIST DEVICE (N/A) TRANSESOPHAGEAL ECHOCARDIOGRAM (TEE) (N/A) Resume po amio and DC iv drip Transfer to stepdown soon Leave chest tube another 24-48 hrs  LOS: 9 days    Luis [name] 04/03/2017 4pm- having pain at pigtail site Will plan on DC tube in am- no air leak with cough on water seal neurontin started for nerve pain P [name] MD

## 2017-04-03 NOTE — NC FL2 (Signed)
[place] LEVEL OF CARE SCREENING TOOL     IDENTIFICATION  Patient Name: Luis [name] Birthdate: Nov 22, 1945 Sex: male Admission Date (Current Location): 03/25/2017  [facility] and [place] Number:  Herbalist and Address:  [facility] [place], [place] [address] [place], [place] [address]      Provider Number: [id number]  Attending Physician Name and Address:  [name], MD  Relative Name and Phone Number:       Current Level of Care: Hospital Recommended Level of Care: [place] Prior Approval Number:    Date Approved/Denied:   PASRR Number: [id number]  Discharge Plan: SNF    Current Diagnoses: Patient Active Problem List   Diagnosis Date Noted  . Pneumothorax on right   . Cardiogenic shock ([place]) 03/28/2017  . 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 Acute respiratory failure with hypoxia ([place]) 03/28/2017  . AKI (acute kidney injury) ([place]) 03/28/2017  . LVAD (left ventricular assist device) present ([place])   . Coronary artery disease 03/25/2017  . Chest pain due to CAD 03/24/2017  . 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    Orientation RESPIRATION BLADDER Height & Weight     Self, Time, Situation, Place  O2 ([place]) Continent Weight: 190 lb 4.1 oz (86.3 kg) Height:  5' 7.5" (171.5 cm)  BEHAVIORAL SYMPTOMS/MOOD NEUROLOGICAL BOWEL NUTRITION STATUS      Continent Diet (see DC summary)  AMBULATORY STATUS COMMUNICATION OF NEEDS Skin   Limited Assist Verbally Surgical wounds                       Personal Care Assistance Level of Assistance  Bathing, Dressing Bathing Assistance: Limited assistance   Dressing Assistance: Limited assistance     Functional Limitations Info             SPECIAL CARE FACTORS FREQUENCY  PT (By licensed PT), OT (By licensed OT)     PT Frequency: 5/wk OT Frequency: 5/wk            Contractures      Additional Factors Info  Code Status, Allergies, Insulin  Sliding Scale Code Status Info: FULL Allergies Info: NKA   Insulin Sliding Scale Info: 8/day       Current Medications (04/03/2017):  This is the current hospital active medication list Current Facility-Administered Medications  Medication Dose Route Frequency Provider Last Rate Last Dose  . 0.45 % sodium chloride infusion   Intravenous Continuous PRN Nani Skillern, PA-C 10 mL/hr at 03/31/17 0600    . 0.9 %  sodium chloride infusion  250 mL Intravenous Continuous Lars Pinks M, PA-C      . 0.9 %  sodium chloride infusion   Intravenous Continuous Lars Pinks M, PA-C   Stopped at 03/30/17 1300  . amiodarone (PACERONE) tablet 200 mg  200 mg Oral BID [name], [name], MD   200 mg at 04/03/17 0929  . aspirin EC tablet 325 mg  325 mg Oral Daily Lars Pinks M, PA-C   325 mg at 04/03/17 0930   Or  . aspirin chewable tablet 324 mg  324 mg Per Tube Daily Tacy Dura, Donielle M, PA-C      . bisacodyl (DULCOLAX) EC tablet 10 mg  10 mg Oral Daily Lars Pinks M, PA-C   10 mg at 04/03/17 6314   Or  . bisacodyl (DULCOLAX) suppository 10 mg  10 mg Rectal Daily Lars Pinks M, PA-C      .  cefTAZidime (FORTAZ) 1 g in dextrose 5 % 50 mL IVPB  1 g Intravenous Q8H [name], MD   Stopped at 04/03/17 [id number]  . Chlorhexidine Gluconate Cloth 2 % PADS 6 each  6 each Topical Daily [name], MD   6 each at 04/03/17 1000  . digoxin (LANOXIN) tablet 0.125 mg  0.125 mg Oral Daily [name], [name], MD   0.125 mg at 04/03/17 0930  . docusate sodium (COLACE) capsule 200 mg  200 mg Oral Daily Lars Pinks M, PA-C   200 mg at 04/03/17 7619  . enoxaparin (LOVENOX) injection 30 mg  30 mg Subcutaneous Q24H [name], [name], MD   30 mg at 04/03/17 1210  . fentaNYL (SUBLIMAZE) injection 25 mcg  25 mcg Intravenous Q2H PRN Shirley Friar, PA-C      . insulin aspart (novoLOG) injection 0-24 Units  0-24 Units Subcutaneous Q4H [name], MD   2 Units at  03/31/17 1226  . insulin detemir (LEVEMIR) injection 24 Units  24 Units Subcutaneous BID [name], MD   24 Units at 04/03/17 0930  . levalbuterol (XOPENEX) nebulizer solution 1.25 mg  1.25 mg Nebulization Q6H PRN [name], [name], MD   1.25 mg at 03/31/17 0453  . milrinone (PRIMACOR) 20 MG/100 ML (0.2 mg/mL) infusion  0.25 mcg/kg/min Intravenous Continuous [name], [name], MD 6.7 mL/hr at 04/03/17 1400 0.25 mcg/kg/min at 04/03/17 1400  . morphine 4 MG/ML injection 4 mg  4 mg Intravenous Q2H PRN [name], MD   4 mg at 04/02/17 1929  . ondansetron (ZOFRAN) injection 4 mg  4 mg Intravenous Q6H PRN Nani Skillern, PA-C   4 mg at 03/27/17 1112  . oxyCODONE (Oxy IR/ROXICODONE) immediate release tablet 5 mg  5 mg Oral Q3H PRN Lars Pinks M, PA-C   5 mg at 04/03/17 1420  . pantoprazole (PROTONIX) EC tablet 40 mg  40 mg Oral Q1200 [name], [name], MD   40 mg at 04/03/17 1210  . phenol (CHLORASEPTIC) mouth spray 1 spray  1 spray Mouth/Throat PRN Grace Isaac, MD   1 spray at 04/01/17 1054  . potassium chloride 10 mEq in 50 mL *CENTRAL LINE* IVPB  10 mEq Intravenous Q1H PRN [name], MD 50 mL/hr at 04/02/17 0751 10 mEq at 04/02/17 0751  . rosuvastatin (CRESTOR) tablet 40 mg  40 mg Oral q1800 Nani Skillern, PA-C   40 mg at 04/02/17 1653  . sodium chloride flush (NS) 0.9 % injection 10-40 mL  10-40 mL Intracatheter Q12H [name], [name], MD   10 mL at 04/03/17 0931  . sodium chloride flush (NS) 0.9 % injection 10-40 mL  10-40 mL Intracatheter PRN [name], MD   40 mL at 03/29/17 1037  . sodium chloride flush (NS) 0.9 % injection 3 mL  3 mL Intravenous Q12H Lars Pinks M, PA-C   3 mL at 04/03/17 0931  . sodium chloride flush (NS) 0.9 % injection 3 mL  3 mL Intravenous PRN Lars Pinks M, PA-C      . spironolactone (ALDACTONE) tablet 12.5 mg  12.5 mg Oral Daily Bensimhon, Shaune Pascal, MD   12.5 mg at 04/03/17 0929  . traMADol (ULTRAM) tablet  50 mg  50 mg Oral Q4H PRN Lars Pinks M, PA-C   50 mg at 04/03/17 1420     Discharge Medications: Please see discharge summary for a list of discharge medications.  Relevant Imaging Results:  Relevant  Lab Results:   Additional Information SS#: [id number]  Jorge Ny, LCSW

## 2017-04-03 NOTE — Progress Notes (Signed)
      [address]       [place] [address]             [phone]      Sleeping currently  BP 97/60   Pulse 81   Temp 98.6 F (37 C) (Oral)   Resp 17   Ht 5' 7.5" (1.715 m)   Wt 190 lb 4.1 oz (86.3 kg)   SpO2 98%   BMI 29.36 kg/m    Intake/Output Summary (Last 24 hours) at 04/03/17 2121 Last data filed at 04/03/17 2000  Gross per 24 hour  Intake           1784.2 ml  Output              300 ml  Net           1484.2 ml   Cr= 1.01, K= 4.1  Continue present care  Remo Lipps C. Roxan Hockey, MD [facility] [phone]

## 2017-04-03 NOTE — Progress Notes (Signed)
Nutrition Education Note  RD consulted for nutrition education regarding a Heart Healthy diet for patient who is a vegetarian. Spoke with patient, his wife, and his Patient and his wife eat a traditional [name] diet. Overall usual diet seems fairly healthy as they do not eat many processed foods.  RD provided "Heart Healthy Nutrition Therapy" handout from the Academy of [employer]. Reviewed patient's dietary recall. Provided examples on ways to decrease sodium and fat intake in diet. Discouraged intake of processed foods and use of salt shaker. Encouraged fresh fruits and vegetables as well as whole grain sources of carbohydrates to maximize fiber intake. Teach back method used.  Expect good compliance.  Body mass index is 29.36 kg/m. Pt meets criteria for overweight based on current BMI.  Current diet order is vegetarian, patient is consuming approximately 50% of meals at this time. Labs and medications reviewed. No further nutrition interventions warranted at this time. RD contact information provided. If additional nutrition issues arise, please re-consult RD.  Molli Barrows, RD, LDN, [place] Pager [phone] After Hours Pager [phone]

## 2017-04-04 ENCOUNTER — Inpatient Hospital Stay (HOSPITAL_COMMUNITY): Payer: Medicare Other

## 2017-04-04 LAB — POCT I-STAT, CHEM 8
BUN: 19 mg/dL (ref 6–20)
Calcium, Ion: 1.08 mmol/L — ABNORMAL LOW (ref 1.15–1.40)
Chloride: 96 mmol/L — ABNORMAL LOW (ref 101–111)
Creatinine, Ser: 1.1 mg/dL (ref 0.61–1.24)
Glucose, Bld: 75 mg/dL (ref 65–99)
HCT: 28 % — ABNORMAL LOW (ref 39.0–52.0)
Hemoglobin: 9.5 g/dL — ABNORMAL LOW (ref 13.0–17.0)
Potassium: 4.8 mmol/L (ref 3.5–5.1)
Sodium: 133 mmol/L — ABNORMAL LOW (ref 135–145)
TCO2: 26 mmol/L (ref 0–100)

## 2017-04-04 LAB — COOXEMETRY PANEL
Carboxyhemoglobin: 1.4 % (ref 0.5–1.5)
Methemoglobin: 1.2 % (ref 0.0–1.5)
O2 Saturation: 60.6 %
Total hemoglobin: 9.1 g/dL — ABNORMAL LOW (ref 12.0–16.0)

## 2017-04-04 LAB — GLUCOSE, CAPILLARY
Glucose-Capillary: 101 mg/dL — ABNORMAL HIGH (ref 65–99)
Glucose-Capillary: 103 mg/dL — ABNORMAL HIGH (ref 65–99)
Glucose-Capillary: 115 mg/dL — ABNORMAL HIGH (ref 65–99)
Glucose-Capillary: 51 mg/dL — ABNORMAL LOW (ref 65–99)
Glucose-Capillary: 74 mg/dL (ref 65–99)
Glucose-Capillary: 85 mg/dL (ref 65–99)
Glucose-Capillary: 99 mg/dL (ref 65–99)

## 2017-04-04 LAB — COMPREHENSIVE METABOLIC PANEL
ALT: 19 U/L (ref 17–63)
AST: 25 U/L (ref 15–41)
Albumin: 2.5 g/dL — ABNORMAL LOW (ref 3.5–5.0)
Alkaline Phosphatase: 53 U/L (ref 38–126)
Anion gap: 7 (ref 5–15)
BUN: 18 mg/dL (ref 6–20)
CO2: 25 mmol/L (ref 22–32)
Calcium: 8 mg/dL — ABNORMAL LOW (ref 8.9–10.3)
Chloride: 101 mmol/L (ref 101–111)
Creatinine, Ser: 1.09 mg/dL (ref 0.61–1.24)
GFR calc Af Amer: >60 mL/min (ref >60)
GFR calc non Af Amer: >60 mL/min (ref >60)
Glucose, Bld: 60 mg/dL — ABNORMAL LOW (ref 65–99)
Potassium: 3.8 mmol/L (ref 3.5–5.1)
Sodium: 133 mmol/L — ABNORMAL LOW (ref 135–145)
Total Bilirubin: 1 mg/dL (ref 0.3–1.2)
Total Protein: 5.1 g/dL — ABNORMAL LOW (ref 6.5–8.1)

## 2017-04-04 LAB — CBC
HCT: 26.6 % — ABNORMAL LOW (ref 39.0–52.0)
Hemoglobin: 8.5 g/dL — ABNORMAL LOW (ref 13.0–17.0)
MCH: 28.9 pg (ref 26.0–34.0)
MCHC: 32 g/dL (ref 30.0–36.0)
MCV: 90.5 fL (ref 78.0–100.0)
Platelets: 356 10*3/uL (ref 150–400)
RBC: 2.94 MIL/uL — ABNORMAL LOW (ref 4.22–5.81)
RDW: 16.6 % — ABNORMAL HIGH (ref 11.5–15.5)
WBC: 11.2 10*3/uL — ABNORMAL HIGH (ref 4.0–10.5)

## 2017-04-04 LAB — MAGNESIUM: Magnesium: 2.3 mg/dL (ref 1.7–2.4)

## 2017-04-04 MED ORDER — POTASSIUM CHLORIDE CRYS ER 20 MEQ PO TBCR
40.0000 meq | EXTENDED_RELEASE_TABLET | Freq: Every day | ORAL | Status: AC
  Administered 2017-04-04 – 2017-04-05 (×2): 40 meq via ORAL
  Filled 2017-04-04 (×2): qty 2

## 2017-04-04 MED ORDER — INSULIN ASPART 100 UNIT/ML ~~LOC~~ SOLN
0.0000 [IU] | Freq: Three times a day (TID) | SUBCUTANEOUS | Status: DC
  Administered 2017-04-07 – 2017-04-08 (×2): 2 [IU] via SUBCUTANEOUS

## 2017-04-04 MED ORDER — FUROSEMIDE 10 MG/ML IJ SOLN
40.0000 mg | Freq: Two times a day (BID) | INTRAMUSCULAR | Status: AC
  Administered 2017-04-04 (×2): 40 mg via INTRAVENOUS
  Filled 2017-04-04 (×2): qty 4

## 2017-04-04 NOTE — Progress Notes (Signed)
      [address]       [place] [address]             [phone]      Comfortable, pain better with pigtail out  BP 108/60   Pulse 86   Temp 98.5 F (36.9 C) (Oral)   Resp 18   Ht 5' 7.5" (1.715 m)   Wt 193 lb 12.6 oz (87.9 kg)   SpO2 93%   BMI 29.90 kg/m    Intake/Output Summary (Last 24 hours) at 04/04/17 1856 Last data filed at 04/04/17 1730  Gross per 24 hour  Intake            597.1 ml  Output             1170 ml  Net           -572.9 ml    Continue present care  Repeat CXR in AM  Magin Balbi C. Roxan Hockey, MD [facility] [phone]

## 2017-04-04 NOTE — Progress Notes (Signed)
4 Days Post-Op Procedure(s) (LRB): REMOVAL OF IMPELLA LEFT VENTRICULAR ASSIST DEVICE (N/A) TRANSESOPHAGEAL ECHOCARDIOGRAM (TEE) (N/A) Subjective: C/o pain from CT  Objective: Vital signs in last 24 hours: Temp:  [98.6 F (37 C)-99 F (37.2 C)] 99 F (37.2 C) (07/07 0400) Pulse Rate:  [80-92] 89 (07/07 0600) Cardiac Rhythm: Normal sinus rhythm (07/07 0400) Resp:  [12-30] 25 (07/07 0600) BP: (86-131)/(50-86) 101/53 (07/07 0500) SpO2:  [95 %-100 %] 97 % (07/07 0600) Weight:  [193 lb 12.6 oz (87.9 kg)] 193 lb 12.6 oz (87.9 kg) (07/07 0500)  Hemodynamic parameters for last 24 hours:    Intake/Output from previous day: 07/06 0701 - 07/07 0700 In: 1464.1 [P.O.:1160; I.V.:154.1; IV Piggyback:150] Out: 590 [Urine:400; Chest Tube:190] Intake/Output this shift: No intake/output data recorded.  General appearance: alert, cooperative and mild distress Neurologic: intact Heart: regular rate and rhythm Lungs: diminished breath sounds bilaterally Abdomen: normal findings: mildly distended, nontender no air leak  Lab Results:  Recent Labs  04/03/17 0329 04/03/17 1646 04/04/17 0357  WBC 11.6*  --  11.2*  HGB 9.1* 9.2* 8.5*  HCT 28.9* 27.0* 26.6*  PLT 275  --  356   BMET:  Recent Labs  04/03/17 0329 04/03/17 1646 04/04/17 0357  NA 133* 135 133*  K 4.2 4.1 3.8  CL 103 97* 101  CO2 24  --  25  GLUCOSE 116* 77 60*  BUN 21* 21* 18  CREATININE 1.18 1.10 1.09  CALCIUM 8.1*  --  8.0*    PT/INR: No results for input(s): LABPROT, INR in the last 72 hours. ABG    Component Value Date/Time   PHART 7.535 (H) 04/02/2017 0429   HCO3 20.7 04/02/2017 0429   TCO2 25 04/03/2017 1646   ACIDBASEDEF 1.0 04/02/2017 0429   O2SAT 52.8 04/03/2017 0338   CBG (last 3)   Recent Labs  04/04/17 0406 04/04/17 0518 04/04/17 0812  GLUCAP 51* 103* 101*    Assessment/Plan: S/P Procedure(s) (LRB): REMOVAL OF IMPELLA LEFT VENTRICULAR ASSIST DEVICE (N/A) TRANSESOPHAGEAL ECHOCARDIOGRAM  (TEE) (N/A) -CV- in SR on amiodarone  Co-ox pending  CVP elevated- Dr. Haroldine Laws has ordered lasix  -RESP- Ct has no air leak and CXR OK- dc chest tube  -RENAL - creatinine OK, mild hyponatremia  -ENDO_ hypoglycemia- dc levemir, change to AC/ HS  Continue to mobilize    LOS: 10 days    Melrose Nakayama 04/04/2017

## 2017-04-04 NOTE — Progress Notes (Signed)
Right pleural pig tail chest tube d/c'd per order.  No distress noted.  Spo2 remains 97-100%.  Pt states he feels much better.  Respirations even/unlabored.  Lungs sounds diminished and equal bilaterally.  Will continue to monitor closely.

## 2017-04-04 NOTE — Progress Notes (Signed)
Advanced Heart Failure Rounding Note  Primary Cardiologist: Dr [name]  Subjective:    S/p emergent CABG x2 and Impella placement 03/25/17 for LM dissection  Impella removed in OR 7/3. EF 30-35% in OR RV mild HK   Developed acute resp distress with removal of chest tube 04/02/17. STAT CXR with large pneumothorax with developing tension.  Dr. Madolyn Frieze emergently placed pigtail  with resolution.   CXR this am with no residual PTX (Personally viewed) ? Elevated right hemidiaphragm.   Felt fine this am prior to walk but more SOB and pain at CT site after walk.   Remains on milrinone 0.25 mcg/kg/min. CVP 10 this am. No co-ox    Objective:   Weight Range: 87.9 kg (193 lb 12.6 oz) Body mass index is 29.9 kg/m.   Vital Signs:   Temp:  [98.6 F (37 C)-99 F (37.2 C)] 99 F (37.2 C) (07/07 0400) Pulse Rate:  [80-92] 89 (07/07 0600) Resp:  [12-30] 25 (07/07 0600) BP: (86-131)/(50-86) 101/53 (07/07 0500) SpO2:  [94 %-100 %] 97 % (07/07 0600) Weight:  [87.9 kg (193 lb 12.6 oz)] 87.9 kg (193 lb 12.6 oz) (07/07 0500) Last BM Date: 04/02/17  Weight change: Filed Weights   04/02/17 0600 04/03/17 0500 04/04/17 0500  Weight: 87.1 kg (192 lb 0.3 oz) 86.3 kg (190 lb 4.1 oz) 87.9 kg (193 lb 12.6 oz)    Intake/Output:   Intake/Output Summary (Last 24 hours) at 04/04/17 0722 Last data filed at 04/04/17 0600  Gross per 24 hour  Intake           1464.1 ml  Output              590 ml  Net            874.1 ml      Physical Exam   CVP 10 General:  HEENT: normal Neck: supple. JVP to jaw. Carotids 2+ bilat; no bruits. No lymphadenopathy or thryomegaly appreciated. Cor: PMI nondisplaced. Regular rate & rhythm. No rubs, gallops or murmurs. Wounds ok  Lungs: clear decreased at right base. Tachypneic Abdomen: soft, nontender, nondistended. No hepatosplenomegaly. No bruits or masses. Good bowel sounds. Extremities: no cyanosis, clubbing, rash, edema Neuro: alert & orientedx3, cranial  nerves grossly intact. moves all 4 extremities w/o difficulty. Affect pleasant   Telemetry   NSR 90s. Personally reviewed   EKG    Personally reviewed am of 04/03/17. X 2 show NSR in 90s  Labs    CBC  Recent Labs  04/03/17 0329 04/03/17 1646 04/04/17 0357  WBC 11.6*  --  11.2*  HGB 9.1* 9.2* 8.5*  HCT 28.9* 27.0* 26.6*  MCV 89.2  --  90.5  PLT 275  --  665   Basic Metabolic Panel  Recent Labs  04/03/17 0329 04/03/17 1646 04/04/17 0357  NA 133* 135 133*  K 4.2 4.1 3.8  CL 103 97* 101  CO2 24  --  25  GLUCOSE 116* 77 60*  BUN 21* 21* 18  CREATININE 1.18 1.10 1.09  CALCIUM 8.1*  --  8.0*  MG 2.3  --  2.3   Liver Function Tests  Recent Labs  04/03/17 0329 04/04/17 0357  AST 20 25  ALT 19 19  ALKPHOS 60 53  BILITOT 0.9 1.0  PROT 5.5* 5.1*  ALBUMIN 2.8* 2.5*   No results for input(s): LIPASE, AMYLASE in the last 72 hours. Cardiac Enzymes No results for input(s): CKTOTAL, CKMB, CKMBINDEX, TROPONINI in the last 72  hours.  BNP: BNP (last 3 results) No results for input(s): BNP in the last 8760 hours.  ProBNP (last 3 results) No results for input(s): PROBNP in the last 8760 hours.   D-Dimer No results for input(s): DDIMER in the last 72 hours. Hemoglobin A1C No results for input(s): HGBA1C in the last 72 hours. Fasting Lipid Panel No results for input(s): CHOL, HDL, LDLCALC, TRIG, CHOLHDL, LDLDIRECT in the last 72 hours. Thyroid Function Tests No results for input(s): TSH, T4TOTAL, T3FREE, THYROIDAB in the last 72 hours.  Invalid input(s): FREET3  Other results:   Imaging    No results found.   Medications:     Scheduled Medications: . amiodarone  200 mg Oral BID  . aspirin EC  325 mg Oral Daily   Or  . aspirin  324 mg Per Tube Daily  . bisacodyl  10 mg Oral Daily   Or  . bisacodyl  10 mg Rectal Daily  . Chlorhexidine Gluconate Cloth  6 each Topical Daily  . digoxin  0.125 mg Oral Daily  . docusate sodium  200 mg Oral Daily  .  enoxaparin (LOVENOX) injection  30 mg Subcutaneous Q24H  . gabapentin  200 mg Oral TID  . insulin aspart  0-24 Units Subcutaneous Q4H  . insulin detemir  24 Units Subcutaneous BID  . pantoprazole  40 mg Oral Q1200  . rosuvastatin  40 mg Oral q1800  . sodium chloride flush  10-40 mL Intracatheter Q12H  . sodium chloride flush  3 mL Intravenous Q12H  . spironolactone  12.5 mg Oral Daily    Infusions: . sodium chloride 10 mL/hr at 03/31/17 0600  . sodium chloride    . sodium chloride Stopped (03/30/17 1300)  . cefTAZidime (FORTAZ)  IV Stopped (04/04/17 0038)  . milrinone 0.25 mcg/kg/min (04/03/17 2316)  . potassium chloride 10 mEq (04/02/17 0751)    PRN Medications: sodium chloride, fentaNYL (SUBLIMAZE) injection, levalbuterol, morphine injection, ondansetron (ZOFRAN) IV, oxyCODONE, phenol, potassium chloride, sodium chloride flush, sodium chloride flush, traMADol    Patient Profile   Luis [name] is 19 year admitted after LHC complicated by Left main dissection, acute MI, cardiogenic shock requiring urgent CABG x2 with impella 5.0 LD on 03/25/17  Assessment/Plan    1. Cardiogenic Shock/acute systolic heart failure in the setting of acute MI due to LM dissection - s/p emergent CABG 03/25/17 with insertion Impella LD 5.0  - s/p Impella removal 7/3 - EF 30-35% by intra-op TEE on 7/3 - Volume status mildly elevated. CVP 10-11 this am. Weight up - Will give 40 mg IV lasix bid today - Remains on milrinone 0.25 mcg/kg/min. Check co-os. Wean as tolerated. - Continue spiro 12.5 mg daily - Continue digoxin 0.125 mg daily.  - No BB yet.  - Start losartan soon as BP tolerates   2. 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 - s/p CABG. No s/s ischemia  - Continue ASA and statin. No change.   3. Acute respiratory failure - Extubated 6/29.  - Re-intubated in OR on 7/3. Now extubated - s/p PTX 7/5 with chest tube removal.  Resolved with emergent replacement of chest tube by Dr. Servando Snare.    - CXR this am with no residual PTX. (reviewed personally) ? Elevated right hemidiaphragm - Weight up continue IV diuresis - Management of CT ber TCTS  4. Acute blood loss anemia - Transfused 2u RBCs on 6/30. - Hgb 9.1-> 8.5 this am. Will follow  5. AKI - Resolved  6. Hypokalemia/Hypomag -  Keep K >4.0 and Mg > 2.0  7. ID - No s/s of infection Finished course of ceftaz for ? Aspiration.   8. PAF, post-op (transient) - Now in NSR - Continue amio 200 mg po BID.   Length of Stay: 10  Glori Bickers, MD  04/04/2017, 7:22 AM  Advanced Heart Failure Team Pager [phone] (M-F; 7a - 4p)  Please contact [facility] for night-coverage after hours (4p -7a ) and weekends on [email or web address]

## 2017-04-04 NOTE — Progress Notes (Signed)
Hypoglycemic Event  CBG: 51  Treatment: 15 GM carbohydrate snack  Symptoms: None  Follow-up CBG: Time:0500 CBG Result:103  Possible Reasons for Event: Inadequate meal intake  Comments/MD notified: Patient asymptomatic. Orange juice and graham crackers given and blood sugar came back up.     [place]

## 2017-04-05 ENCOUNTER — Inpatient Hospital Stay (HOSPITAL_COMMUNITY): Payer: Medicare Other

## 2017-04-05 LAB — COMPREHENSIVE METABOLIC PANEL
ALT: 19 U/L (ref 17–63)
AST: 25 U/L (ref 15–41)
Albumin: 2.6 g/dL — ABNORMAL LOW (ref 3.5–5.0)
Alkaline Phosphatase: 53 U/L (ref 38–126)
Anion gap: 7 (ref 5–15)
BUN: 16 mg/dL (ref 6–20)
CO2: 26 mmol/L (ref 22–32)
Calcium: 8.1 mg/dL — ABNORMAL LOW (ref 8.9–10.3)
Chloride: 99 mmol/L — ABNORMAL LOW (ref 101–111)
Creatinine, Ser: 1.16 mg/dL (ref 0.61–1.24)
GFR calc Af Amer: >60 mL/min (ref >60)
GFR calc non Af Amer: >60 mL/min (ref >60)
Glucose, Bld: 112 mg/dL — ABNORMAL HIGH (ref 65–99)
Potassium: 4.3 mmol/L (ref 3.5–5.1)
Sodium: 132 mmol/L — ABNORMAL LOW (ref 135–145)
Total Bilirubin: 0.8 mg/dL (ref 0.3–1.2)
Total Protein: 5.3 g/dL — ABNORMAL LOW (ref 6.5–8.1)

## 2017-04-05 LAB — COOXEMETRY PANEL
Carboxyhemoglobin: 1.5 % (ref 0.5–1.5)
Methemoglobin: 1.2 % (ref 0.0–1.5)
O2 Saturation: 64.6 %
Total hemoglobin: 8.4 g/dL — ABNORMAL LOW (ref 12.0–16.0)

## 2017-04-05 LAB — MAGNESIUM: Magnesium: 2.2 mg/dL (ref 1.7–2.4)

## 2017-04-05 LAB — GLUCOSE, CAPILLARY
Glucose-Capillary: 95 mg/dL (ref 65–99)
Glucose-Capillary: 117 mg/dL — ABNORMAL HIGH (ref 65–99)
Glucose-Capillary: 122 mg/dL — ABNORMAL HIGH (ref 65–99)
Glucose-Capillary: 106 mg/dL — ABNORMAL HIGH (ref 65–99)

## 2017-04-05 LAB — CBC
HCT: 26.7 % — ABNORMAL LOW (ref 39.0–52.0)
Hemoglobin: 8.5 g/dL — ABNORMAL LOW (ref 13.0–17.0)
MCH: 28.6 pg (ref 26.0–34.0)
MCHC: 31.8 g/dL (ref 30.0–36.0)
MCV: 89.9 fL (ref 78.0–100.0)
Platelets: 406 10*3/uL — ABNORMAL HIGH (ref 150–400)
RBC: 2.97 MIL/uL — ABNORMAL LOW (ref 4.22–5.81)
RDW: 16.3 % — ABNORMAL HIGH (ref 11.5–15.5)
WBC: 10.3 10*3/uL (ref 4.0–10.5)

## 2017-04-05 LAB — POCT I-STAT, CHEM 8
BUN: 15 mg/dL (ref 6–20)
Calcium, Ion: 1.08 mmol/L — ABNORMAL LOW (ref 1.15–1.40)
Chloride: 97 mmol/L — ABNORMAL LOW (ref 101–111)
Creatinine, Ser: 1.1 mg/dL (ref 0.61–1.24)
Glucose, Bld: 113 mg/dL — ABNORMAL HIGH (ref 65–99)
HCT: 29 % — ABNORMAL LOW (ref 39.0–52.0)
Hemoglobin: 9.9 g/dL — ABNORMAL LOW (ref 13.0–17.0)
Potassium: 4.8 mmol/L (ref 3.5–5.1)
Sodium: 134 mmol/L — ABNORMAL LOW (ref 135–145)
TCO2: 27 mmol/L (ref 0–100)

## 2017-04-05 MED ORDER — FUROSEMIDE 40 MG PO TABS
40.0000 mg | ORAL_TABLET | Freq: Every day | ORAL | Status: DC
  Administered 2017-04-05: 40 mg via ORAL
  Filled 2017-04-05: qty 1

## 2017-04-05 NOTE — Progress Notes (Signed)
      [address]       [place] [address]             [phone]      Up in chair  BP 102/60   Pulse 83   Temp 98.2 F (36.8 C) (Oral)   Resp (!) 25   Ht 5' 7.5" (1.715 m)   Wt 188 lb 15 oz (85.7 kg)   SpO2 96%   BMI 29.15 kg/m    Intake/Output Summary (Last 24 hours) at 04/05/17 1919 Last data filed at 04/05/17 1732  Gross per 24 hour  Intake           1700.8 ml  Output             3075 ml  Net          -1374.2 ml   Continues to diurese  Possible transfer tomorrow  Remo Lipps C. Roxan Hockey, MD [facility] [phone]

## 2017-04-05 NOTE — Progress Notes (Signed)
5 Days Post-Op Procedure(s) (LRB): REMOVAL OF IMPELLA LEFT VENTRICULAR ASSIST DEVICE (N/A) TRANSESOPHAGEAL ECHOCARDIOGRAM (TEE) (N/A) Subjective: Still has some incisional pain Feels stronger  Objective: Vital signs in last 24 hours: Temp:  [97.9 F (36.6 C)-99.7 F (37.6 C)] 97.9 F (36.6 C) (07/08 0700) Pulse Rate:  [83-87] 85 (07/08 0600) Cardiac Rhythm: Normal sinus rhythm (07/07 2000) Resp:  [13-25] 14 (07/08 0600) BP: (91-120)/(46-69) 91/56 (07/08 0600) SpO2:  [93 %-100 %] 93 % (07/08 0600) Weight:  [188 lb 15 oz (85.7 kg)] 188 lb 15 oz (85.7 kg) (07/08 0600)  Hemodynamic parameters for last 24 hours: CVP:  [9 mmHg] 9 mmHg  Intake/Output from previous day: 07/07 0701 - 07/08 0700 In: 1504.1 [P.O.:1200; I.V.:154.1; IV Piggyback:150] Out: 2525 [Urine:2525] Intake/Output this shift: No intake/output data recorded.  General appearance: alert, cooperative and no distress Neurologic: intact Heart: regular rate and rhythm Lungs: diminished BS left base Abdomen: normal findings: soft, non-tender Wound: clean and dry  Lab Results:  Recent Labs  04/04/17 0357 04/04/17 1713 04/05/17 0353  WBC 11.2*  --  10.3  HGB 8.5* 9.5* 8.5*  HCT 26.6* 28.0* 26.7*  PLT 356  --  406*   BMET:  Recent Labs  04/04/17 0357 04/04/17 1713 04/05/17 0353  NA 133* 133* 132*  K 3.8 4.8 4.3  CL 101 96* 99*  CO2 25  --  26  GLUCOSE 60* 75 112*  BUN 18 19 16   CREATININE 1.09 1.10 1.16  CALCIUM 8.0*  --  8.1*    PT/INR: No results for input(s): LABPROT, INR in the last 72 hours. ABG    Component Value Date/Time   PHART 7.535 (H) 04/02/2017 0429   HCO3 20.7 04/02/2017 0429   TCO2 26 04/04/2017 1713   ACIDBASEDEF 1.0 04/02/2017 0429   O2SAT 64.6 04/05/2017 0355   CBG (last 3)   Recent Labs  04/04/17 1135 04/04/17 1610 04/04/17 2148  GLUCAP 115* 85 99    Assessment/Plan: S/P Procedure(s) (LRB): REMOVAL OF IMPELLA LEFT VENTRICULAR ASSIST DEVICE (N/A) TRANSESOPHAGEAL  ECHOCARDIOGRAM (TEE) (N/A) -CV- stable on milrinone drip  -RESP- left effusion noted on CXR, continue diuresis/ IS  -RENAL- creatinine OK, diuresing well on current regimen  -ENDO- CBG well controlled  - DVT prophylaxis- SCD + enoxaparin  Deconditioning- continue cardiac rehab   LOS: 11 days    Luis [name] 04/05/2017

## 2017-04-05 NOTE — Plan of Care (Signed)
Problem: Cardiac: Goal: Hemodynamic stability will improve Outcome: Progressing Pt maintaining adequate hemodynamics after Impella removed, remains on low dose Milrinone. Dysrhythmias controlled on PO Amiodarone and Digoxin per orders. Epicardial pacing wires taped and protected.  Problem: Nutritional: Goal: Risk for body nutrition deficit will decrease Outcome: Progressing Pt prefers vegan diet and [name] food. Pt family met with dietician and discussed appropriate food they could bring from home that would meet the Pt's nutritional needs. Pt demonstrating meal intake of 75-100 % of meals. BM x 1 04-02-17.

## 2017-04-05 NOTE — Progress Notes (Signed)
Advanced Heart Failure Rounding Note  Primary Cardiologist: Dr [name]  Subjective:    S/p emergent CABG x2 and Impella placement 03/25/17 for LM dissection  Impella removed in OR 7/3. EF 30-35% in OR RV mild HK   Developed acute resp distress with removal of chest tube 04/02/17. STAT CXR with large pneumothorax with developing tension.  Dr. Madolyn Frieze emergently placed pigtail  with resolution.   Feels better this am. CP resolved. CXR with mild edema. No PTX (viewed personally). Diuresing well weight down 5 pounds. Nearing baseline.   Co-ox 65%. CVP 9   Objective:   Weight Range: 85.7 kg (188 lb 15 oz) Body mass index is 29.15 kg/m.   Vital Signs:   Temp:  [97.9 F (36.6 C)-99.7 F (37.6 C)] 97.9 F (36.6 C) (07/08 1100) Pulse Rate:  [83-88] 88 (07/08 0900) Resp:  [13-25] 21 (07/08 0900) BP: (91-118)/(46-69) 103/61 (07/08 0800) SpO2:  [93 %-100 %] 99 % (07/08 0900) Weight:  [85.7 kg (188 lb 15 oz)] 85.7 kg (188 lb 15 oz) (07/08 0600) Last BM Date: 04/03/17  Weight change: Filed Weights   04/03/17 0500 04/04/17 0500 04/05/17 0600  Weight: 86.3 kg (190 lb 4.1 oz) 87.9 kg (193 lb 12.6 oz) 85.7 kg (188 lb 15 oz)    Intake/Output:   Intake/Output Summary (Last 24 hours) at 04/05/17 1139 Last data filed at 04/05/17 1000  Gross per 24 hour  Intake           1490.7 ml  Output             2525 ml  Net          -1034.3 ml      Physical Exam   General:  Lying in bed. NAD HEENT: normal Neck: supple. JVP 9 Carotids 2+ bilat; no bruits. No lymphadenopathy or thryomegaly appreciated. Cor: Sternum ok. PMI nondisplaced. Regular rate & rhythm. No rubs, gallops or murmurs. Lungs: clear anteriorly  Abdomen: soft, nontender, nondistended. No hepatosplenomegaly. No bruits or masses. Good bowel sounds. Extremities: no cyanosis, clubbing, rash, edema Neuro: alert & orientedx3, cranial nerves grossly intact. moves all 4 extremities w/o difficulty. Affect pleasant   Telemetry    NSR 80s Personally reviewed    EKG    N/A  Labs    CBC  Recent Labs  04/04/17 0357 04/04/17 1713 04/05/17 0353  WBC 11.2*  --  10.3  HGB 8.5* 9.5* 8.5*  HCT 26.6* 28.0* 26.7*  MCV 90.5  --  89.9  PLT 356  --  378*   Basic Metabolic Panel  Recent Labs  04/04/17 0357 04/04/17 1713 04/05/17 0353  NA 133* 133* 132*  K 3.8 4.8 4.3  CL 101 96* 99*  CO2 25  --  26  GLUCOSE 60* 75 112*  BUN 18 19 16   CREATININE 1.09 1.10 1.16  CALCIUM 8.0*  --  8.1*  MG 2.3  --  2.2   Liver Function Tests  Recent Labs  04/04/17 0357 04/05/17 0353  AST 25 25  ALT 19 19  ALKPHOS 53 53  BILITOT 1.0 0.8  PROT 5.1* 5.3*  ALBUMIN 2.5* 2.6*   No results for input(s): LIPASE, AMYLASE in the last 72 hours. Cardiac Enzymes No results for input(s): CKTOTAL, CKMB, CKMBINDEX, TROPONINI in the last 72 hours.  BNP: BNP (last 3 results) No results for input(s): BNP in the last 8760 hours.  ProBNP (last 3 results) No results for input(s): PROBNP in the last 8760 hours.  D-Dimer No results for input(s): DDIMER in the last 72 hours. Hemoglobin A1C No results for input(s): HGBA1C in the last 72 hours. Fasting Lipid Panel No results for input(s): CHOL, HDL, LDLCALC, TRIG, CHOLHDL, LDLDIRECT in the last 72 hours. Thyroid Function Tests No results for input(s): TSH, T4TOTAL, T3FREE, THYROIDAB in the last 72 hours.  Invalid input(s): FREET3  Other results:   Imaging    Dg Chest Port 1 View  Result Date: 04/05/2017 CLINICAL DATA:  Evaluate for pneumothorax. EXAM: PORTABLE CHEST 1 VIEW COMPARISON:  April 04, 2017 FINDINGS: The right chest tube is been removed. No pneumothorax. The right central line appears to terminate within the right atrium, 3 cm below the caval atrial junction. Increased bilateral pulmonary opacities may represent mild edema. Thickening of the pleural stripe superiorly on the left suggests pleural fluid. Opacity in the medial right lung base is mildly more  focal. IMPRESSION: 1. The right chest tube has been removed without pneumothorax. 2. Increased interstitial opacities suggest edema. 3. Increasing density of the pleural stripe superiorly on the left suggests pleural fluid, not seen previously. 4. Mildly more patchy opacity in the right base could represent edema or atelectasis. Early infiltrate not excluded. Recommend attention on follow-up. Electronically Signed   By: Dorise Bullion III M.D   On: 04/05/2017 06:58     Medications:     Scheduled Medications: . amiodarone  200 mg Oral BID  . aspirin EC  325 mg Oral Daily   Or  . aspirin  324 mg Per Tube Daily  . Chlorhexidine Gluconate Cloth  6 each Topical Daily  . digoxin  0.125 mg Oral Daily  . enoxaparin (LOVENOX) injection  30 mg Subcutaneous Q24H  . gabapentin  200 mg Oral TID  . insulin aspart  0-15 Units Subcutaneous TID WC  . pantoprazole  40 mg Oral Q1200  . rosuvastatin  40 mg Oral q1800  . sodium chloride flush  10-40 mL Intracatheter Q12H  . sodium chloride flush  3 mL Intravenous Q12H  . spironolactone  12.5 mg Oral Daily    Infusions: . sodium chloride 10 mL/hr at 03/31/17 0600  . sodium chloride    . sodium chloride 10 mL/hr (04/05/17 0951)  . cefTAZidime (FORTAZ)  IV Stopped (04/05/17 0837)  . milrinone 0.25 mcg/kg/min (04/05/17 0731)  . potassium chloride 10 mEq (04/02/17 0751)    PRN Medications: sodium chloride, fentaNYL (SUBLIMAZE) injection, levalbuterol, morphine injection, ondansetron (ZOFRAN) IV, oxyCODONE, phenol, potassium chloride, sodium chloride flush, sodium chloride flush, traMADol    Patient Profile   Mr Budai is 56 year admitted after LHC complicated by Left main dissection, acute MI, cardiogenic shock requiring urgent CABG x2 with impella 5.0 LD on 03/25/17  Assessment/Plan    1. Cardiogenic Shock/acute systolic heart failure in the setting of acute MI due to LM dissection - s/p emergent CABG 03/25/17 with insertion Impella LD 5.0  - s/p  Impella removal 7/3 - EF 30-35% by intra-op TEE on 7/3 - Volume status improving. CVP 9 this am. Weight coming down. Close to baseline. Change to po lasix 40 daily  - Remains on milrinone 0.25 mcg/kg/min. Co-ox improved. Wean milrinone to 0.125 - Continue spiro 12.5 mg daily - Continue digoxin 0.125 mg daily.  - No BB yet.  - BP too low for losartan  - Hopefully to 2W today.  2. 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 - s/p CABG. No s/s ischemia. - Continue ASA and statin. No change.   3.  Acute respiratory failure - Extubated 6/29.  - Re-intubated in OR on 7/3. Now extubated - s/p PTX 7/5 with chest tube removal.  Resolved with emergent replacement of chest tube by Dr. Servando Snare. Chest tube now out.  - CXR this am with no residual PTX. (reviewed personally) ? Elevated right hemidiaphragm - Continue IS. Respiratory status improving  4. Acute blood loss anemia - Transfused 2u RBCs on 6/30. - Hgb 9.1-> 8.5 this am. Will follow. Low threshold to transfuse  5. AKI - Resolved  6. Hypokalemia/Hypomag - Keep K >4.0 and Mg > 2.0. K 4.3 today  7. ID - No s/s of infection Finished course of ceftaz for ? Aspiration.   8. PAF, post-op (transient) - Now in NSR - Continue amio 200 mg po BID.   Length of Stay: 11  Glori Bickers, MD  04/05/2017, 11:39 AM  Advanced Heart Failure Team Pager [phone] (M-F; 7a - 4p)  Please contact [facility] for night-coverage after hours (4p -7a ) and weekends on [email or web address]

## 2017-04-06 ENCOUNTER — Inpatient Hospital Stay (HOSPITAL_COMMUNITY): Payer: Medicare Other

## 2017-04-06 LAB — COMPREHENSIVE METABOLIC PANEL
ALT: 20 U/L (ref 17–63)
AST: 20 U/L (ref 15–41)
Albumin: 2.7 g/dL — ABNORMAL LOW (ref 3.5–5.0)
Alkaline Phosphatase: 56 U/L (ref 38–126)
Anion gap: 6 (ref 5–15)
BUN: 11 mg/dL (ref 6–20)
CO2: 26 mmol/L (ref 22–32)
Calcium: 8.3 mg/dL — ABNORMAL LOW (ref 8.9–10.3)
Chloride: 100 mmol/L — ABNORMAL LOW (ref 101–111)
Creatinine, Ser: 1.11 mg/dL (ref 0.61–1.24)
GFR calc Af Amer: >60 mL/min (ref >60)
GFR calc non Af Amer: >60 mL/min (ref >60)
Glucose, Bld: 109 mg/dL — ABNORMAL HIGH (ref 65–99)
Potassium: 4.8 mmol/L (ref 3.5–5.1)
Sodium: 132 mmol/L — ABNORMAL LOW (ref 135–145)
Total Bilirubin: 1 mg/dL (ref 0.3–1.2)
Total Protein: 5.8 g/dL — ABNORMAL LOW (ref 6.5–8.1)

## 2017-04-06 LAB — CBC
HCT: 28.3 % — ABNORMAL LOW (ref 39.0–52.0)
Hemoglobin: 9 g/dL — ABNORMAL LOW (ref 13.0–17.0)
MCH: 28.9 pg (ref 26.0–34.0)
MCHC: 31.8 g/dL (ref 30.0–36.0)
MCV: 91 fL (ref 78.0–100.0)
Platelets: 500 10*3/uL — ABNORMAL HIGH (ref 150–400)
RBC: 3.11 MIL/uL — ABNORMAL LOW (ref 4.22–5.81)
RDW: 16.3 % — ABNORMAL HIGH (ref 11.5–15.5)
WBC: 9.6 10*3/uL (ref 4.0–10.5)

## 2017-04-06 LAB — MAGNESIUM: Magnesium: 2.2 mg/dL (ref 1.7–2.4)

## 2017-04-06 LAB — COOXEMETRY PANEL
Carboxyhemoglobin: 1.4 % (ref 0.5–1.5)
Methemoglobin: 1.2 % (ref 0.0–1.5)
O2 Saturation: 54.4 %
Total hemoglobin: 9.2 g/dL — ABNORMAL LOW (ref 12.0–16.0)

## 2017-04-06 LAB — GLUCOSE, CAPILLARY
Glucose-Capillary: 105 mg/dL — ABNORMAL HIGH (ref 65–99)
Glucose-Capillary: 105 mg/dL — ABNORMAL HIGH (ref 65–99)
Glucose-Capillary: 121 mg/dL — ABNORMAL HIGH (ref 65–99)
Glucose-Capillary: 133 mg/dL — ABNORMAL HIGH (ref 65–99)

## 2017-04-06 LAB — DIGOXIN LEVEL: Digoxin Level: <0.2 ng/mL — ABNORMAL LOW (ref 0.8–2.0)

## 2017-04-06 MED ORDER — METOLAZONE 5 MG PO TABS
5.0000 mg | ORAL_TABLET | Freq: Once | ORAL | Status: AC
  Administered 2017-04-06: 5 mg via ORAL
  Filled 2017-04-06: qty 1

## 2017-04-06 MED ORDER — FUROSEMIDE 40 MG PO TABS
40.0000 mg | ORAL_TABLET | Freq: Once | ORAL | Status: AC
  Administered 2017-04-06: 40 mg via ORAL
  Filled 2017-04-06: qty 1

## 2017-04-06 MED ORDER — FUROSEMIDE 20 MG PO TABS
20.0000 mg | ORAL_TABLET | Freq: Every day | ORAL | Status: DC
  Administered 2017-04-07: 20 mg via ORAL
  Filled 2017-04-06: qty 1

## 2017-04-06 MED ORDER — FUROSEMIDE 40 MG PO TABS: 40.0000 mg | ORAL_TABLET | Freq: Two times a day (BID) | ORAL | Status: DC

## 2017-04-06 NOTE — Progress Notes (Signed)
CARDIAC REHAB PHASE I   PRE:  Rate/Rhythm: 87 SR    BP: sitting 107/65    SaO2: 93 RA  MODE:  Ambulation: 690 ft   POST:  Rate/Rhythm: 102 ST with PVCs    BP: sitting 117/66     SaO2: 95 RA  Mod assist to get out of bed. Eager to walk. Used RW for increased distance. Fairly steady with RW but unsteady in room without it. No c/o. Return to BR for BM then recliner. VSS with PVCs. Will f/u. [place], ACSM 04/06/2017 2:29 PM

## 2017-04-06 NOTE — Progress Notes (Signed)
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  Extubated 7/3 with Impella removed as well.     PT Comments    Patient is progressing well toward mobility goals. Pt tolerated increased gait distance. Noted increased RR into 30s and SpO2 down to 88% on RA while ambulating and up to 94% with standing break and breathing technique. PT will continue to follow for mobility progression and d/c planning.    Follow Up Recommendations  SNF;Supervision/Assistance - 24 hour     Equipment Recommendations  Other (comment) (TBA)    Recommendations for Other Services OT consult     Precautions / Restrictions Precautions Precautions: Fall;Sternal Precaution Comments: pt required cues to maintain precautions throughout session  Restrictions Weight Bearing Restrictions: No    Mobility  Bed Mobility Overal bed mobility: Needs Assistance Bed Mobility: Rolling;Sit to Sidelying Rolling: Min guard       Sit to sidelying: Min assist General bed mobility comments: cues for precautions and use of heart pillow; cues for sequencing  Transfers Overall transfer level: Needs assistance Equipment used: Rolling walker (2 wheeled) Transfers: Sit to/from Stand Sit to Stand: Min assist         General transfer comment: cues for hand placement; assist to power up into standing; pt used momentum   Ambulation/Gait Ambulation/Gait assistance: Museum/gallery curator (Feet): 350 Feet Assistive device: Rolling walker (2 wheeled) Gait Pattern/deviations: Step-through pattern;Decreased stride length     General Gait Details: cues for posture and for rest break; pt denied need for rest break however with O2 desat at 88% on RA and RR up to 30s; cues for  pursed lip breathing and vitals returned to [facility]   Stairs            Wheelchair Mobility    Modified Rankin (Stroke Patients Only)       Balance Overall balance assessment: Needs assistance Sitting-balance support: No upper extremity supported;Feet supported Sitting balance-Leahy Scale: Good     Standing balance support: Bilateral upper extremity supported;During functional activity Standing balance-Leahy Scale: Fair                              Cognition Arousal/Alertness: Awake/alert Behavior During Therapy: Flat affect Overall Cognitive Status: Within Functional Limits for tasks assessed                                        Exercises      General Comments        Pertinent Vitals/Pain Pain Assessment: 0-10 Pain Score: 6  Pain Location: incisional Pain Descriptors / Indicators: Operative site guarding;Sore Pain Intervention(s): Limited activity within patient's tolerance;Monitored during session;Premedicated before session;Repositioned    Home Living                      Prior Function            PT Goals (current goals can now be found in the care plan section) Acute Rehab PT Goals Patient Stated Goal: to go home PT Goal Formulation: With patient Time For Goal Achievement: 04/15/17 Potential to Achieve Goals: Good Progress towards PT goals:  Progressing toward goals    Frequency    Min 3X/week      PT Plan Current plan remains appropriate    Co-evaluation              AM-PAC PT "6 Clicks" Daily Activity  Outcome Measure  Difficulty turning over in bed (including adjusting bedclothes, sheets and blankets)?: A Lot Difficulty moving from lying on back to sitting on the side of the bed? : A Lot Difficulty sitting down on and standing up from a chair with arms (e.g., wheelchair, bedside commode, etc,.)?: Total Help needed moving to and from a bed to chair (including a wheelchair)?: A Little Help  needed walking in hospital room?: A Little Help needed climbing 3-5 steps with a railing? : A Lot 6 Click Score: 13    End of Session Equipment Utilized During Treatment: Gait belt Activity Tolerance: Patient tolerated treatment well Patient left: with call bell/phone within reach;in bed Nurse Communication: Mobility status PT Visit Diagnosis: Unsteadiness on feet (R26.81);Muscle weakness (generalized) (M62.81);Pain Pain - part of body:  (incisional )     Time: 0240-9735 PT Time Calculation (min) (ACUTE ONLY): 16 min  Charges:  $Gait Training: 8-22 mins                    G Codes:       Earney Navy, PTA Pager: [phone]     Darliss Cheney 04/06/2017, 1:30 PM

## 2017-04-06 NOTE — Progress Notes (Signed)
6 Days Post-Op Procedure(s) (LRB): REMOVAL OF IMPELLA LEFT VENTRICULAR ASSIST DEVICE (N/A) TRANSESOPHAGEAL ECHOCARDIOGRAM (TEE) (N/A) Subjective: \chest tube out nsr progressing well  tx to stepdown Leave on mil for low co-ox Objective: Vital signs in last 24 hours: Temp:  [97.9 F (36.6 C)-99.1 F (37.3 C)] 98.7 F (37.1 C) (07/09 0800) Pulse Rate:  [81-88] 82 (07/09 0800) Cardiac Rhythm: Normal sinus rhythm (07/08 2000) Resp:  [13-33] 15 (07/09 0800) BP: (99-120)/(55-87) 117/87 (07/09 0800) SpO2:  [93 %-100 %] 98 % (07/09 0800) Weight:  [185 lb 3 oz (84 kg)-186 lb 11.7 oz (84.7 kg)] 186 lb 11.7 oz (84.7 kg) (07/09 0600)  Hemodynamic parameters for last 24 hours:  stable  Intake/Output from previous day: 07/08 0701 - 07/09 0700 In: 1681.4 [P.O.:1200; I.V.:331.4; IV Piggyback:150] Out: 2725 [Urine:2725] Intake/Output this shift: Total I/O In: -  Out: 100 [Urine:100]       Exam    General- alert and comfortable   Lungs- clear without rales, wheezes   Cor- regular rate and rhythm, no murmur , gallop   Abdomen- soft, non-tender   Extremities - warm, non-tender, minimal edema   Neuro- oriented, appropriate, no focal weakness   Lab Results:  Recent Labs  04/05/17 0353 04/05/17 1758 04/06/17 0417  WBC 10.3  --  9.6  HGB 8.5* 9.9* 9.0*  HCT 26.7* 29.0* 28.3*  PLT 406*  --  500*   BMET:  Recent Labs  04/05/17 0353 04/05/17 1758 04/06/17 0417  NA 132* 134* 132*  K 4.3 4.8 4.8  CL 99* 97* 100*  CO2 26  --  26  GLUCOSE 112* 113* 109*  BUN 16 15 11   CREATININE 1.16 1.10 1.11  CALCIUM 8.1*  --  8.3*    PT/INR: No results for input(s): LABPROT, INR in the last 72 hours. ABG    Component Value Date/Time   PHART 7.535 (H) 04/02/2017 0429   HCO3 20.7 04/02/2017 0429   TCO2 27 04/05/2017 1758   ACIDBASEDEF 1.0 04/02/2017 0429   O2SAT 54.4 04/06/2017 0420   CBG (last 3)   Recent Labs  04/05/17 1130 04/05/17 1543 04/05/17 2134  GLUCAP 117* 122* 106*     Assessment/Plan: S/P Procedure(s) (LRB): REMOVAL OF IMPELLA LEFT VENTRICULAR ASSIST DEVICE (N/A) TRANSESOPHAGEAL ECHOCARDIOGRAM (TEE) (N/A) Mobilize Diuresis Plan for transfer to step-down: see transfer orders   LOS: 12 days    Tharon Aquas Trigt III 04/06/2017

## 2017-04-06 NOTE — Progress Notes (Signed)
Advanced Heart Failure Rounding Note  Primary Cardiologist: Dr [name]  Subjective:    S/p emergent CABG x2 and Impella placement 03/25/17 for LM dissection  Impella removed in OR 7/3. EF 30-35% in OR RV mild HK   Developed acute resp distress with removal of chest tube 04/02/17. STAT CXR with large pneumothorax with developing tension.  Dr. Madolyn Frieze emergently placed pigtail  with resolution.   Yesterday milrinone was cut back to 0.125 mcg . Todays CO-OX is 54%.   Denies SOB/Pain.   Objective:   Weight Range: 186 lb 11.7 oz (84.7 kg) Body mass index is 28.81 kg/m.   Vital Signs:   Temp:  [97.9 F (36.6 C)-99.1 F (37.3 C)] 98.7 F (37.1 C) (07/09 0800) Pulse Rate:  [81-88] 82 (07/09 0800) Resp:  [13-33] 15 (07/09 0800) BP: (99-120)/(55-87) 117/87 (07/09 0800) SpO2:  [93 %-100 %] 98 % (07/09 0800) Weight:  [185 lb 3 oz (84 kg)-186 lb 11.7 oz (84.7 kg)] 186 lb 11.7 oz (84.7 kg) (07/09 0600) Last BM Date: 04/03/17  Weight change: Filed Weights   04/05/17 0600 04/06/17 0400 04/06/17 0600  Weight: 188 lb 15 oz (85.7 kg) 185 lb 3 oz (84 kg) 186 lb 11.7 oz (84.7 kg)    Intake/Output:   Intake/Output Summary (Last 24 hours) at 04/06/17 0959 Last data filed at 04/06/17 0800  Gross per 24 hour  Intake             1018 ml  Output             2825 ml  Net            -1807 ml      Physical Exam   CVP 9-10 General:  Well appearing. No resp difficulty. In bed HEENT: normal Neck: supple. ~10. Carotids 2+ bilat; no bruits. No lymphadenopathy or thryomegaly appreciated. Cor: PMI nondisplaced. Regular rate & rhythm. No rubs, gallops or murmurs. Lungs: clear Abdomen: soft, nontender, nondistended. No hepatosplenomegaly. No bruits or masses. Good bowel sounds. Extremities: no cyanosis, clubbing, rash, edema Neuro: alert & orientedx3, cranial nerves grossly intact. moves all 4 extremities w/o difficulty. Affect pleasant   Telemetry  NSR 80s personally reviewed.     EKG     N/A  Labs    CBC  Recent Labs  04/05/17 0353 04/05/17 1758 04/06/17 0417  WBC 10.3  --  9.6  HGB 8.5* 9.9* 9.0*  HCT 26.7* 29.0* 28.3*  MCV 89.9  --  91.0  PLT 406*  --  856*   Basic Metabolic Panel  Recent Labs  04/05/17 0353 04/05/17 1758 04/06/17 0417  NA 132* 134* 132*  K 4.3 4.8 4.8  CL 99* 97* 100*  CO2 26  --  26  GLUCOSE 112* 113* 109*  BUN 16 15 11   CREATININE 1.16 1.10 1.11  CALCIUM 8.1*  --  8.3*  MG 2.2  --  2.2   Liver Function Tests  Recent Labs  04/05/17 0353 04/06/17 0417  AST 25 20  ALT 19 20  ALKPHOS 53 56  BILITOT 0.8 1.0  PROT 5.3* 5.8*  ALBUMIN 2.6* 2.7*   No results for input(s): LIPASE, AMYLASE in the last 72 hours. Cardiac Enzymes No results for input(s): CKTOTAL, CKMB, CKMBINDEX, TROPONINI in the last 72 hours.  BNP: BNP (last 3 results) No results for input(s): BNP in the last 8760 hours.  ProBNP (last 3 results) No results for input(s): PROBNP in the last 8760 hours.   D-Dimer  No results for input(s): DDIMER in the last 72 hours. Hemoglobin A1C No results for input(s): HGBA1C in the last 72 hours. Fasting Lipid Panel No results for input(s): CHOL, HDL, LDLCALC, TRIG, CHOLHDL, LDLDIRECT in the last 72 hours. Thyroid Function Tests No results for input(s): TSH, T4TOTAL, T3FREE, THYROIDAB in the last 72 hours.  Invalid input(s): FREET3  Other results:   Imaging    Dg Chest Port 1 View  Result Date: 04/06/2017 CLINICAL DATA:  Atelectasis EXAM: PORTABLE CHEST 1 VIEW COMPARISON:  04/05/2017 FINDINGS: Left lower lobe atelectasis/ infiltrate unchanged. Resolution of pleural fluid in the left apex. Mild right lower lobe atelectasis unchanged. CABG. Negative for heart failure or edema. Central line tip in the lower right atrium unchanged in position. IMPRESSION: Left lower lobe atelectasis/infiltrate unchanged. Left apical pleural effusion improved Mild right lower lobe atelectasis unchanged. Electronically Signed    By: Franchot Gallo M.D.   On: 04/06/2017 07:43     Medications:     Scheduled Medications: . amiodarone  200 mg Oral BID  . aspirin EC  325 mg Oral Daily   Or  . aspirin  324 mg Per Tube Daily  . Chlorhexidine Gluconate Cloth  6 each Topical Daily  . digoxin  0.125 mg Oral Daily  . enoxaparin (LOVENOX) injection  30 mg Subcutaneous Q24H  . furosemide  40 mg Oral Daily  . gabapentin  200 mg Oral TID  . insulin aspart  0-15 Units Subcutaneous TID WC  . metolazone  5 mg Oral Once  . pantoprazole  40 mg Oral Q1200  . rosuvastatin  40 mg Oral q1800  . sodium chloride flush  10-40 mL Intracatheter Q12H  . sodium chloride flush  3 mL Intravenous Q12H  . spironolactone  12.5 mg Oral Daily    Infusions: . sodium chloride 10 mL/hr (04/05/17 0951)  . milrinone 0.125 mcg/kg/min (04/06/17 0700)  . potassium chloride 10 mEq (04/02/17 0751)    PRN Medications: levalbuterol, ondansetron (ZOFRAN) IV, oxyCODONE, phenol, potassium chloride, sodium chloride flush, sodium chloride flush, traMADol    Patient Profile   Luis [name] is 35 year admitted after LHC complicated by Left main dissection, acute MI, cardiogenic shock requiring urgent CABG x2 with impella 5.0 LD on 03/25/17  Assessment/Plan    1. Cardiogenic Shock/acute systolic heart failure in the setting of acute MI due to LM dissection - s/p emergent CABG 03/25/17 with insertion Impella LD 5.0  - s/p Impella removal 7/3 - EF 30-35% by intra-op TEE on 7/3 -Today Co-ox is 54%. Continue milrinone 0.125 mcg.  - Volume status mildly elevated. CVP 10. Increase lasix to 40 mg twice a day.  - Continue spiro 12.5 mg daily. K 4.8  - Continue digoxin 0.125 mg daily.  - No BB yet.  - Hold off on losartan.   2. 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 - s/p CABG. No s/s ischemia. - Continue ASA and statin. No change.   3. Acute respiratory failure - Extubated 6/29.  - Re-intubated in OR on 7/3. Now extubated - s/p PTX 7/5 with chest  tube removal.  Resolved with emergent replacement of chest tube by Dr. Servando Snare. Chest tube now out.  -Resolved. On room air.  4. Acute blood loss anemia - Transfused 2u RBCs on 6/30. - Hgb 9. Stable.   5. AKI - Resolved  6. Hypokalemia/Hypomag - Keep K >4.0 and Mg > 2.0. K 4.8   7. ID - No s/s of infection Finished course of ceftaz  for ? Aspiration.   8. PAF, post-op (transient) - Maintaining NSR.  - Continue amio 200 mg po BID.   Length of Stay: [place], NP  04/06/2017, 9:59 AM  Advanced Heart Failure Team Pager [phone] (M-F; [place])  Please contact [facility] for night-coverage after hours (4p -7a ) and weekends on [email or web address]  Patient seen and examined with Darrick Grinder, NP. We discussed all aspects of the encounter. I agree with the assessment and plan as stated above.   Improving slowly but co-ox remains marginal on low-dose milrinone. Will not wean off today. Volume status looks good. Will reduce dose of po lasix. Maintaining NSR. Continue po amio. Continue to ambulate.   Glori Bickers, MD  11:53 PM

## 2017-04-06 NOTE — Plan of Care (Signed)
Problem: Activity: Goal: Risk for activity intolerance will decrease Outcome: Progressing Pt tolerating increase in activity, demonstrating the ability to ambulate 370 ft three times daily without difficulty. OOB for meals as well.  Problem: Pain Management: Goal: Pain level will decrease Outcome: Progressing Pt pain tolerance and control better managed with scheduled Neurontin and PRN oxycodone. Demonstrates appropriate use of splinting pillow with C/DB exercises.

## 2017-04-07 DIAGNOSIS — I5021 Acute systolic (congestive) heart failure: Secondary | ICD-10-CM

## 2017-04-07 LAB — CBC
HCT: 30.1 % — ABNORMAL LOW (ref 39.0–52.0)
Hemoglobin: 9.5 g/dL — ABNORMAL LOW (ref 13.0–17.0)
MCH: 28.8 pg (ref 26.0–34.0)
MCHC: 31.6 g/dL (ref 30.0–36.0)
MCV: 91.2 fL (ref 78.0–100.0)
Platelets: 560 10*3/uL — ABNORMAL HIGH (ref 150–400)
RBC: 3.3 MIL/uL — ABNORMAL LOW (ref 4.22–5.81)
RDW: 16 % — ABNORMAL HIGH (ref 11.5–15.5)
WBC: 10 10*3/uL (ref 4.0–10.5)

## 2017-04-07 LAB — COOXEMETRY PANEL
Carboxyhemoglobin: 1.3 % (ref 0.5–1.5)
Carboxyhemoglobin: 1.4 % (ref 0.5–1.5)
Methemoglobin: 1.2 % (ref 0.0–1.5)
Methemoglobin: 1.1 % (ref 0.0–1.5)
O2 Saturation: 49.3 %
O2 Saturation: 58.3 %
Total hemoglobin: 10.7 g/dL — ABNORMAL LOW (ref 12.0–16.0)
Total hemoglobin: 10 g/dL — ABNORMAL LOW (ref 12.0–16.0)

## 2017-04-07 LAB — COMPREHENSIVE METABOLIC PANEL
ALT: 20 U/L (ref 17–63)
AST: 22 U/L (ref 15–41)
Albumin: 2.9 g/dL — ABNORMAL LOW (ref 3.5–5.0)
Alkaline Phosphatase: 54 U/L (ref 38–126)
Anion gap: 8 (ref 5–15)
BUN: 12 mg/dL (ref 6–20)
CO2: 27 mmol/L (ref 22–32)
Calcium: 8.8 mg/dL — ABNORMAL LOW (ref 8.9–10.3)
Chloride: 97 mmol/L — ABNORMAL LOW (ref 101–111)
Creatinine, Ser: 1.26 mg/dL — ABNORMAL HIGH (ref 0.61–1.24)
GFR calc Af Amer: >60 mL/min (ref >60)
GFR calc non Af Amer: 56 mL/min — ABNORMAL LOW (ref >60)
Glucose, Bld: 115 mg/dL — ABNORMAL HIGH (ref 65–99)
Potassium: 4.4 mmol/L (ref 3.5–5.1)
Sodium: 132 mmol/L — ABNORMAL LOW (ref 135–145)
Total Bilirubin: 1 mg/dL (ref 0.3–1.2)
Total Protein: 6.2 g/dL — ABNORMAL LOW (ref 6.5–8.1)

## 2017-04-07 LAB — GLUCOSE, CAPILLARY
Glucose-Capillary: 118 mg/dL — ABNORMAL HIGH (ref 65–99)
Glucose-Capillary: 90 mg/dL (ref 65–99)
Glucose-Capillary: 132 mg/dL — ABNORMAL HIGH (ref 65–99)
Glucose-Capillary: 103 mg/dL — ABNORMAL HIGH (ref 65–99)

## 2017-04-07 MED ORDER — AMIODARONE HCL 200 MG PO TABS
200.0000 mg | ORAL_TABLET | Freq: Every day | ORAL | Status: DC
  Administered 2017-04-08 – 2017-04-10 (×3): 200 mg via ORAL
  Filled 2017-04-07 (×3): qty 1

## 2017-04-07 NOTE — Progress Notes (Addendum)
[address]unctionSuite [address]       ,[place] [address]             [phone]      7 Days Post-Op Procedure(s) (LRB): REMOVAL OF IMPELLA LEFT VENTRICULAR ASSIST DEVICE (N/A) TRANSESOPHAGEAL ECHOCARDIOGRAM (TEE) (N/A) Subjective: conts to feel better, denies SOB/DOE, ambulated 4 times yesterday  Objective: Vital signs in last 24 hours: Temp:  [98.2 F (36.8 C)-98.7 F (37.1 C)] 98.3 F (36.8 C) (07/10 0500) Pulse Rate:  [82-85] 84 (07/10 0500) Cardiac Rhythm: Normal sinus rhythm (07/10 0700) Resp:  [15-20] 18 (07/10 0500) BP: (99-124)/(60-87) 109/61 (07/10 0500) SpO2:  [95 %-98 %] 95 % (07/10 0500) Weight:  [178 lb 12.8 oz (81.1 kg)] 178 lb 12.8 oz (81.1 kg) (07/10 0500)  Hemodynamic parameters for last 24 hours:    Intake/Output from previous day: 07/09 0701 - 07/10 0700 In: 493.6 [P.O.:480; I.V.:13.6] Out: 2050 [Urine:2050] Intake/Output this shift: No intake/output data recorded.  General appearance: alert, cooperative and no distress Heart: regular rate and rhythm Lungs: dim in bases Abdomen: benign Extremities: no edema Wound: incis healing well  Lab Results:  Recent Labs  04/06/17 0417 04/07/17 0459  WBC 9.6 10.0  HGB 9.0* 9.5*  HCT 28.3* 30.1*  PLT 500* 560*   BMET:  Recent Labs  04/06/17 0417 04/07/17 0459  NA 132* 132*  K 4.8 4.4  CL 100* 97*  CO2 26 27  GLUCOSE 109* 115*  BUN 11 12  CREATININE 1.11 1.26*  CALCIUM 8.3* 8.8*    PT/INR: No results for input(s): LABPROT, INR in the last 72 hours. ABG    Component Value Date/Time   PHART 7.535 (H) 04/02/2017 0429   HCO3 20.7 04/02/2017 0429   TCO2 27 04/05/2017 1758   ACIDBASEDEF 1.0 04/02/2017 0429   O2SAT 49.3 04/07/2017 0500   CBG (last 3)   Recent Labs  04/06/17 1626 04/06/17 2112 04/07/17 0601  GLUCAP 121* 133* 118*    Meds Scheduled Meds: . amiodarone  200 mg Oral BID  . aspirin EC  325 mg Oral Daily   Or  . aspirin  324 mg Per Tube Daily  . digoxin  0.125  mg Oral Daily  . enoxaparin (LOVENOX) injection  30 mg Subcutaneous Q24H  . furosemide  20 mg Oral Daily  . gabapentin  200 mg Oral TID  . insulin aspart  0-15 Units Subcutaneous TID WC  . pantoprazole  40 mg Oral Q1200  . rosuvastatin  40 mg Oral q1800  . sodium chloride flush  10-40 mL Intracatheter Q12H  . sodium chloride flush  3 mL Intravenous Q12H  . spironolactone  12.5 mg Oral Daily   Continuous Infusions: . sodium chloride 10 mL/hr at 04/06/17 1945  . milrinone 0.125 mcg/kg/min (04/06/17 0700)  . potassium chloride 10 mEq (04/02/17 0751)   PRN Meds:.levalbuterol, ondansetron (ZOFRAN) IV, oxyCODONE, phenol, potassium chloride, sodium chloride flush, sodium chloride flush, traMADol  Xrays Dg Chest Port 1 View  Result Date: 04/06/2017 CLINICAL DATA:  Atelectasis EXAM: PORTABLE CHEST 1 VIEW COMPARISON:  04/05/2017 FINDINGS: Left lower lobe atelectasis/ infiltrate unchanged. Resolution of pleural fluid in the left apex. Mild right lower lobe atelectasis unchanged. CABG. Negative for heart failure or edema. Central line tip in the lower right atrium unchanged in position. IMPRESSION: Left lower lobe atelectasis/infiltrate unchanged. Left apical pleural effusion improved Mild right lower lobe atelectasis unchanged. Electronically Signed   By: Franchot Gallo M.D.   On: 04/06/2017 07:43  Assessment/Plan: S/P Procedure(s) (LRB): REMOVAL OF IMPELLA LEFT VENTRICULAR ASSIST DEVICE (N/A) TRANSESOPHAGEAL ECHOCARDIOGRAM (TEE) (N/A)  1 steady improvement 2 milrinone at .159mcg/kg/min , CO-OX 49.3 today from 54.4 yesterday. - AHF team managing. Also in digoxin/lasix- Creat up slightly today, monitor 3 sinus rhythm on amiodarone , BP well controlled 4 sugars well controlled, A1C 6.1 preop - needs aggressive dietary management  5 push rehab and pulm toilet as able  LOS: 13 days    GOLD,WAYNE E 04/07/2017  The patient is recovering from his CABG with transaortic Impeiia VAD His medical  regimen for post MI heart failure is being titrated by [facility] heart failure service. Surgical incisions are healing. Maintaining sinus rhythm. patient examined and medical record reviewed,agree with above note. Tharon Aquas Trigt III 04/07/2017

## 2017-04-07 NOTE — Progress Notes (Signed)
CARDIAC REHAB PHASE I   PRE:  Rate/Rhythm: 86 SR    BP: sitting 113/64    SaO2: 91 RA  MODE:  Ambulation: 690 ft   POST:  Rate/Rhythm: 97 SR    BP: sitting 127/61     SaO2: 98 RA  Pt doing well, moved out of bed and walked with min assist, RW. Steady. Rest x1. He does not complain much. Tired toward end of walk, to recliner. VSS. Encouraged more walking and IS.  [place], ACSM 04/07/2017 9:56 AM

## 2017-04-07 NOTE — Progress Notes (Signed)
Advanced Heart Failure Rounding Note  Primary Cardiologist: Dr [name]  Subjective:    S/p emergent CABG x2 and Impella placement 03/25/17 for LM dissection  Impella removed in OR 7/3. EF 30-35% in OR RV mild HK   Developed acute resp distress with removal of chest tube 04/02/17. STAT CXR with large pneumothorax with developing tension.  Dr. Madolyn Frieze emergently placed pigtail  with resolution.   04/05/17 milrinone was cut back to 0.125 mcg . Todays coox is 49%.  Stat repeat 58%  Feeling good. Denies exertional CP, SOB, lightheadedness, or dizziness. Chest wounds still sore.   Objective:   Weight Range: 178 lb 12.8 oz (81.1 kg) Body mass index is 27.59 kg/m.   Vital Signs:   Temp:  [98.2 F (36.8 C)-98.3 F (36.8 C)] 98.3 F (36.8 C) (07/10 0500) Pulse Rate:  [84-85] 84 (07/10 0500) Resp:  [16-20] 18 (07/10 0500) BP: (99-124)/(60-68) 109/61 (07/10 0500) SpO2:  [95 %-98 %] 95 % (07/10 0500) Weight:  [178 lb 12.8 oz (81.1 kg)] 178 lb 12.8 oz (81.1 kg) (07/10 0500) Last BM Date: 04/06/17  Weight change: Filed Weights   04/06/17 0400 04/06/17 0600 04/07/17 0500  Weight: 185 lb 3 oz (84 kg) 186 lb 11.7 oz (84.7 kg) 178 lb 12.8 oz (81.1 kg)    Intake/Output:   Intake/Output Summary (Last 24 hours) at 04/07/17 0851 Last data filed at 04/07/17 0500  Gross per 24 hour  Intake            250.2 ml  Output             1950 ml  Net          -1699.8 ml      Physical Exam   General: Well appearing. No resp difficulty. HEENT: Normal Neck: Supple. JVP 8-9. Carotids 2+ bilat; no bruits. No thyromegaly or nodule noted. Cor: PMI nondisplaced. RRR, No M/G/R noted. Surgical wounds C/D/I Lungs: CTAB, normal effort. Abdomen: Soft, non-tender, non-distended, no HSM. No bruits or masses. +BS  Extremities: No cyanosis, clubbing, rash, R and LLE no edema.  Neuro: Alert & orientedx3, cranial nerves grossly intact. moves all 4 extremities w/o difficulty. Affect pleasant   Telemetry     Personally reviewed, NSR 80s  EKG    N/A  Labs    CBC  Recent Labs  04/06/17 0417 04/07/17 0459  WBC 9.6 10.0  HGB 9.0* 9.5*  HCT 28.3* 30.1*  MCV 91.0 91.2  PLT 500* 546*   Basic Metabolic Panel  Recent Labs  04/05/17 0353  04/06/17 0417 04/07/17 0459  NA 132*  < > 132* 132*  K 4.3  < > 4.8 4.4  CL 99*  < > 100* 97*  CO2 26  --  26 27  GLUCOSE 112*  < > 109* 115*  BUN 16  < > 11 12  CREATININE 1.16  < > 1.11 1.26*  CALCIUM 8.1*  --  8.3* 8.8*  MG 2.2  --  2.2  --   < > = values in this interval not displayed. Liver Function Tests  Recent Labs  04/06/17 0417 04/07/17 0459  AST 20 22  ALT 20 20  ALKPHOS 56 54  BILITOT 1.0 1.0  PROT 5.8* 6.2*  ALBUMIN 2.7* 2.9*   No results for input(s): LIPASE, AMYLASE in the last 72 hours. Cardiac Enzymes No results for input(s): CKTOTAL, CKMB, CKMBINDEX, TROPONINI in the last 72 hours.  BNP: BNP (last 3 results) No results  for input(s): BNP in the last 8760 hours.  ProBNP (last 3 results) No results for input(s): PROBNP in the last 8760 hours.   D-Dimer No results for input(s): DDIMER in the last 72 hours. Hemoglobin A1C No results for input(s): HGBA1C in the last 72 hours. Fasting Lipid Panel No results for input(s): CHOL, HDL, LDLCALC, TRIG, CHOLHDL, LDLDIRECT in the last 72 hours. Thyroid Function Tests No results for input(s): TSH, T4TOTAL, T3FREE, THYROIDAB in the last 72 hours.  Invalid input(s): FREET3  Other results:   Imaging    No results found.   Medications:     Scheduled Medications: . amiodarone  200 mg Oral BID  . aspirin EC  325 mg Oral Daily   Or  . aspirin  324 mg Per Tube Daily  . digoxin  0.125 mg Oral Daily  . enoxaparin (LOVENOX) injection  30 mg Subcutaneous Q24H  . furosemide  20 mg Oral Daily  . gabapentin  200 mg Oral TID  . insulin aspart  0-15 Units Subcutaneous TID WC  . pantoprazole  40 mg Oral Q1200  . rosuvastatin  40 mg Oral q1800  . sodium chloride  flush  10-40 mL Intracatheter Q12H  . sodium chloride flush  3 mL Intravenous Q12H  . spironolactone  12.5 mg Oral Daily    Infusions: . sodium chloride 10 mL/hr at 04/06/17 1945  . milrinone 0.125 mcg/kg/min (04/06/17 0700)  . potassium chloride 10 mEq (04/02/17 0751)    PRN Medications: levalbuterol, ondansetron (ZOFRAN) IV, oxyCODONE, phenol, potassium chloride, sodium chloride flush, sodium chloride flush, traMADol    Patient Profile   Luis [name] is 73 year admitted after LHC complicated by Left main dissection, acute MI, cardiogenic shock requiring urgent CABG x2 with impella 5.0 LD on 03/25/17  Assessment/Plan    1. Cardiogenic Shock/acute systolic heart failure in the setting of acute MI due to LM dissection - s/p emergent CABG 03/25/17 with insertion Impella LD 5.0  - s/p Impella removal 7/3 - EF 30-35% by intra-op TEE on 7/3 - Todays coox initially 49%. Stat repeat 58%. Milrinone remains at 0.125 mcg/kg/min.  - Volume status looks better on lasix 40 mg BID.  - Continue spiro 12.5 mg daily. K 4.4 today.  - Continue digoxin 0.125 mg daily.  - No BB with marginal mixed venous sat.  - Hold off on losartan.   2. 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 - s/p CABG. No s/s ischemia. - Continue ASA and statin. No change.    3. Acute respiratory failure - Extubated 6/29.  - Re-intubated in OR on 7/3. Now extubated - s/p PTX 7/5 with chest tube removal.  Resolved with emergent replacement of chest tube by Dr. Servando Snare. Chest tube now out.  - Resolved. Now stable on room air.    4. Acute blood loss anemia - Transfused 2u RBCs on 6/30. - Hgb 9.5 this am. Stable.    5. AKI - Creatinine mildly elevated 1.1 -> 1.26. Continue to follow.   6. Hypokalemia/Hypomag - Keep K >4.0 and Mg > 2.0.  - K 4.4 this am. Mg 2.2 04/06/17. Follow tomorrow.  7. ID - No s/s of infection Finished course of ceftaz for ? Aspiration.  - Afebrile. WBC 10.0   8. PAF, post-op (transient) -  Maintaining NSR on amio 200 mg BID.   ADDENDUM:  Recheck Coox 58%. Continue current milrinone vs consider wean. Pt feeling well overall.   Length of Stay: [place], [place]  04/07/2017, 8:51 AM  Advanced Heart Failure Team Pager [phone] (M-F; [place])  Please contact [facility] for night-coverage after hours (4p -7a ) and weekends on [email or web address]  Patient seen and examined with the above-signed Advanced Practice Provider and/or Housestaff. I personally reviewed laboratory data, imaging studies and relevant notes. I independently examined the patient and formulated the important aspects of the plan. I have edited the note to reflect any of my changes or salient points. I have personally discussed the plan with the patient and/or family.  Co-ox remains marginal but otherwise doing well. Will continue milrinone one ore day and try to wean to off tomorrow. Weight down below baseline. Will hold am lasix. Can likely cut to 20 or 40 daily prior to d/c. Maintaining NSR. Decrease amio to 200 daily.   Glori Bickers, MD  8:14 PM

## 2017-04-07 NOTE — Care Management Important Message (Signed)
Important Message  Patient Details  Name: Luis [name] MRN: [id number] Date of Birth: [birth year]   Medicare Important Message Given:  Yes    Brenner Visconti [name] 04/07/2017, 2:32 PM

## 2017-04-08 DIAGNOSIS — I25110 Atherosclerotic heart disease of native coronary artery with unstable angina pectoris: Secondary | ICD-10-CM

## 2017-04-08 DIAGNOSIS — I2542 Coronary artery dissection: Secondary | ICD-10-CM

## 2017-04-08 LAB — BASIC METABOLIC PANEL
Anion gap: 8 (ref 5–15)
BUN: 12 mg/dL (ref 6–20)
CO2: 27 mmol/L (ref 22–32)
Calcium: 8.9 mg/dL (ref 8.9–10.3)
Chloride: 97 mmol/L — ABNORMAL LOW (ref 101–111)
Creatinine, Ser: 1.16 mg/dL (ref 0.61–1.24)
GFR calc Af Amer: >60 mL/min (ref >60)
GFR calc non Af Amer: >60 mL/min (ref >60)
Glucose, Bld: 105 mg/dL — ABNORMAL HIGH (ref 65–99)
Potassium: 4.7 mmol/L (ref 3.5–5.1)
Sodium: 132 mmol/L — ABNORMAL LOW (ref 135–145)

## 2017-04-08 LAB — GLUCOSE, CAPILLARY
Glucose-Capillary: 107 mg/dL — ABNORMAL HIGH (ref 65–99)
Glucose-Capillary: 120 mg/dL — ABNORMAL HIGH (ref 65–99)
Glucose-Capillary: 142 mg/dL — ABNORMAL HIGH (ref 65–99)
Glucose-Capillary: 132 mg/dL — ABNORMAL HIGH (ref 65–99)

## 2017-04-08 LAB — COOXEMETRY PANEL
Carboxyhemoglobin: 1.7 % — ABNORMAL HIGH (ref 0.5–1.5)
Methemoglobin: 0.8 % (ref 0.0–1.5)
O2 Saturation: 56.6 %
Total hemoglobin: 10.2 g/dL — ABNORMAL LOW (ref 12.0–16.0)

## 2017-04-08 MED ORDER — ENOXAPARIN SODIUM 40 MG/0.4ML ~~LOC~~ SOLN
40.0000 mg | SUBCUTANEOUS | Status: DC
  Administered 2017-04-09 – 2017-04-10 (×2): 40 mg via SUBCUTANEOUS
  Filled 2017-04-08 (×2): qty 0.4

## 2017-04-08 MED ORDER — ENOXAPARIN SODIUM 30 MG/0.3ML ~~LOC~~ SOLN: 30.0000 mg | SUBCUTANEOUS | Status: DC

## 2017-04-08 MED ORDER — LOSARTAN POTASSIUM 25 MG PO TABS
12.5000 mg | ORAL_TABLET | Freq: Every day | ORAL | Status: DC
  Administered 2017-04-08 – 2017-04-09 (×2): 12.5 mg via ORAL
  Filled 2017-04-08 (×2): qty 1

## 2017-04-08 NOTE — Progress Notes (Signed)
Advanced Heart Failure Rounding Note  Primary Cardiologist: Dr [name]  Subjective:    S/p emergent CABG x2 and Impella placement 03/25/17 for LM dissection  Impella removed in OR 7/3. EF 30-35% in OR RV mild HK   Developed acute resp distress with removal of chest tube 04/02/17. STAT CXR with large pneumothorax with developing tension.  Dr. Madolyn Frieze emergently placed pigtail  with resolution.   04/05/17 milrinone was cut back to 0.125 mcg .  Remains on milrinone 0.125 mcg. CO-OX 57%.   Overall feeling good. Denies SOB. Had BM today.    Objective:   Weight Range: 81.4 kg (179 lb 6.4 oz) Body mass index is 27.68 kg/m.   Vital Signs:   Temp:  [98.1 F (36.7 C)-99.1 F (37.3 C)] 98.5 F (36.9 C) (07/11 0639) Pulse Rate:  [88] 88 (07/11 0639) Resp:  [18-22] 22 (07/10 2212) BP: (97-113)/(58-67) 113/67 (07/11 0843) SpO2:  [93 %-98 %] 93 % (07/11 0639) Weight:  [81.4 kg (179 lb 6.4 oz)] 81.4 kg (179 lb 6.4 oz) (07/11 0639) Last BM Date: 04/07/17  Weight change: Filed Weights   04/06/17 0600 04/07/17 0500 04/08/17 0639  Weight: 84.7 kg (186 lb 11.7 oz) 81.1 kg (178 lb 12.8 oz) 81.4 kg (179 lb 6.4 oz)    Intake/Output:   Intake/Output Summary (Last 24 hours) at 04/08/17 1050 Last data filed at 04/08/17 0700  Gross per 24 hour  Intake           1110.8 ml  Output             2226 ml  Net          -1115.2 ml      Physical Exam   General:  Well appearing. No resp difficulty. In bed.  HEENT: normal Neck: supple. JVP 6-7 . Carotids 2+ bilat; no bruits. No lymphadenopathy or thryomegaly appreciated. Cor: PMI nondisplaced. Regular rate & rhythm. No rubs, gallops or murmurs. Sternal incision approximated.  Lungs: clear Abdomen: soft, nontender, nondistended. No hepatosplenomegaly. No bruits or masses. Good bowel sounds. Extremities: no cyanosis, clubbing, rash, edema Neuro: alert & orientedx3, cranial nerves grossly intact. moves all 4 extremities w/o difficulty. Affect  pleasant   Telemetry   NSR 80s. Personally reviewed.   EKG    N/A  Labs    CBC  Recent Labs  04/06/17 0417 04/07/17 0459  WBC 9.6 10.0  HGB 9.0* 9.5*  HCT 28.3* 30.1*  MCV 91.0 91.2  PLT 500* 010*   Basic Metabolic Panel  Recent Labs  04/06/17 0417 04/07/17 0459 04/08/17 0256  NA 132* 132* 132*  K 4.8 4.4 4.7  CL 100* 97* 97*  CO2 26 27 27   GLUCOSE 109* 115* 105*  BUN 11 12 12   CREATININE 1.11 1.26* 1.16  CALCIUM 8.3* 8.8* 8.9  MG 2.2  --   --    Liver Function Tests  Recent Labs  04/06/17 0417 04/07/17 0459  AST 20 22  ALT 20 20  ALKPHOS 56 54  BILITOT 1.0 1.0  PROT 5.8* 6.2*  ALBUMIN 2.7* 2.9*   No results for input(s): LIPASE, AMYLASE in the last 72 hours. Cardiac Enzymes No results for input(s): CKTOTAL, CKMB, CKMBINDEX, TROPONINI in the last 72 hours.  BNP: BNP (last 3 results) No results for input(s): BNP in the last 8760 hours.  ProBNP (last 3 results) No results for input(s): PROBNP in the last 8760 hours.   D-Dimer No results for input(s): DDIMER in the  last 72 hours. Hemoglobin A1C No results for input(s): HGBA1C in the last 72 hours. Fasting Lipid Panel No results for input(s): CHOL, HDL, LDLCALC, TRIG, CHOLHDL, LDLDIRECT in the last 72 hours. Thyroid Function Tests No results for input(s): TSH, T4TOTAL, T3FREE, THYROIDAB in the last 72 hours.  Invalid input(s): FREET3  Other results:   Imaging    No results found.   Medications:     Scheduled Medications: . amiodarone  200 mg Oral Daily  . aspirin EC  325 mg Oral Daily   Or  . aspirin  324 mg Per Tube Daily  . digoxin  0.125 mg Oral Daily  . [START ON 04/09/2017] enoxaparin (LOVENOX) injection  40 mg Subcutaneous Q24H  . gabapentin  200 mg Oral TID  . insulin aspart  0-15 Units Subcutaneous TID WC  . pantoprazole  40 mg Oral Q1200  . rosuvastatin  40 mg Oral q1800  . sodium chloride flush  10-40 mL Intracatheter Q12H  . sodium chloride flush  3 mL  Intravenous Q12H  . spironolactone  12.5 mg Oral Daily    Infusions: . sodium chloride 10 mL/hr at 04/06/17 1945  . milrinone 0.125 mcg/kg/min (04/06/17 0700)  . potassium chloride 10 mEq (04/02/17 0751)    PRN Medications: levalbuterol, ondansetron (ZOFRAN) IV, oxyCODONE, phenol, potassium chloride, sodium chloride flush, sodium chloride flush, traMADol    Patient Profile   [name] is 51 year admitted after LHC complicated by Left main dissection, acute MI, cardiogenic shock requiring urgent CABG x2 with impella 5.0 LD on 03/25/17  Assessment/Plan    1. Cardiogenic Shock/acute systolic heart failure in the setting of acute MI due to LM dissection - s/p emergent CABG 03/25/17 with insertion Impella LD 5.0  - s/p Impella removal 7/3 - EF 30-35% by intra-op TEE on 7/3 - Todays CO-OX is 57%. Stop milrinone.  - Volume status looks better on lasix 40 mg BID.  - Continue spiro 12.5 mg daily. K 4.7   - Continue digoxin 0.125 mg daily.  - No BB with marginal mixed venous sat.  - Add 12.5 mg losartan at bed time. Watch potassium  - BMET in am.    2. 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 - s/p CABG. No s/s ischemia. - Continue ASA and statin. No change.    3. Acute respiratory failure - Extubated 6/29.  - Re-intubated in OR on 7/3. Now extubated - s/p PTX 7/5 with chest tube removal.  Resolved with emergent replacement of chest tube by Dr. Servando Snare. Chest tube now out.  - Resolved. Now stable on room air.    4. Acute blood loss anemia - Transfused 2u RBCs on 6/30. -CBC in am.  5. AKI - Creatinine down to 1.16.    6. Hypokalemia/Hypomag - Keep K >4.0 and Mg > 2.0.  - K 4.7. Mag in am.   7. ID - No s/s of infection Finished course of ceftaz for ? Aspiration.  Afebrile.   8. PAF, post-op (transient) - Maintaining NSR. Continue amio 200 mg twice a day.     Length of Stay: [place], [place]  04/08/2017, 10:50 AM  Advanced Heart Failure Team Pager [phone] (M-F;  [place])  Please contact [facility] for night-coverage after hours (4p -7a ) and weekends on [email or web address]   Patient seen and examined with Darrick Grinder, NP. We discussed all aspects of the encounter. I agree with the assessment and plan as stated above.   Doing very  well. Mild chest soreness. Volume status ok. Walking halls.Maintaining NSR. Co-ox marginal on low-dose milrinone. Will stop. Recheck in am. Encouraged IS.   Possible home tomorrow or Friday from our standpoint.   Glori Bickers, MD  10:23 PM

## 2017-04-08 NOTE — Discharge Summary (Signed)
Physician Discharge Summary       [address]       [place] [address]             [phone]    Patient ID: Luis [name] MRN: [id number] DOB/AGE: 04/08/1946 71 y.o.  Admit date: 03/25/2017 Discharge date: 04/10/2017  Admission Diagnoses: 1. Cardiogenic shock ([place]) 2. Dissected left main 3. Coronary artery disease  Active Diagnoses:  1. ASD 2. Acute respiratory failure with hypoxia (H[facility]) 3. Chronic kidney disease-stage II 4. Sleep apnea 5. Diabetes mellitus without complication ([place]) 6. Dyslipidemia 7.  History of hepatomegaly 9. Vitamin D deficiency 10. ABL anemia 11. Pneumothorax on right  Consults: Heart failure  Procedure (s):  Left Heart Cath and Coronary Angiography by Dr. [name] on 03/25/2017:  Conclusion     Inf Sept lesion, 100 %stenosed.   Coronary angiogram 03/25/2017: Mild decrease in LV systolic function, EF 32% with inferior, inferoapical severe hypokinesis. LAD CTO midsegment just after origin of a large D1, short segment occlusion with type II collaterals from the RCA. Bridging Collaterals also evident. Mid circumflex very large with a 75% stenosis. Large OM1. Mild disease. RCA mild diffuse disease, large vessel. Type II collaterals to the LAD. Distal small PL secondary branch is occluded and has collaterals from left to right.   Interventional data: Unsuccessful attempt at PTCA LAD, left main dissection occurred, patient had to be emergently transferred to the operating room. Patient's family members including wife and son were met with, Dr. Dahlia Byes evaluated the patient in the Cath Lab. 150 mL contrast utilized.  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    1. Emergency coronary artery bypass grafting x2 (left internal mammary     artery to LAD, saphenous vein graft to circumflex marginal). 2. Closure of patent foramen ovale. 3. Placement of percutaneous left ventricular assist device,  Impella     LD via 10-mm Hemashield graft sewn to the ascending aorta. 4. Endoscopic harvest of right leg greater saphenous vein by Dr. [name] on 03/25/2017.  History of Presenting Illness: The patient is a 71 year old male presenting for an evaluation of an abnormal EKG. Luis [name] is 71 years old Asian [name] male. Patient has felt epigastric pain and lower substernal discomfort off and on. It is not related to exertion. For one week, he also had nausea associated with these symptoms. No history of radiation, no associated dyspnea or diaphoresis.  No complaints of shortness of breath, orthopnea or PND. No history of palpitation, dizziness, near-syncope or syncope. No history of swelling on the legs and no claudication.  No history of hypertension. He has prediabetes. He also has hypercholesterolemia, patient was given Crestor which he took for about one year and then stopped 2 months ago. He does not smoke. He usually walks for couple of miles a day.  He underwent a cardiac catheterization on 03/25/2017. Unfortunately, the left main was dissected and an emergent consultation was obtained with Dr. [name]. Potential risks, benefits, and complications of the surgery were discussed with the patient and his family. They agreed to proceed with emergent coronary artery bypass grafting surgery. Patient underwent an emergent CABG x 2, placement of Impella LD, and closure of ASD on 03/25/2017.  Brief Hospital Course:  The patient was extubated on 03/27/2017 without difficulty.He was on a Heparin drip because of the Impella. He was also on Nitric oxide, Levophed, and Epinephrine drips.  He remained afebrile and hemodynamically stable. Impella  was removed on 07/03. Drips were weaned over the next several days. He was also on a Milrinone drip. Co ox's were monitored. Heart failure followed patient post op. Milrinone was weaned off 04/08/2017. Gordy Councilman, a line, chest tubes, and foley were removed  early in the post operative course. He was put on Ceftazidime for possible PNA. Digoxin and Spironolactone were started and titrated accordingly. He was volume over loaded and diuresed. Chest tubes were removed on 04/02/2017. He had acute decompensation afterward. His oxygen saturation decreased into the 80's, he became tachypnic, and he went into a fib with RVR, CXR showed right sided pneumothorax (about 39%, possibly developing tension). Dr. Servando Snare placed a pigtail catheter. Follow up chest x ray showed near total re expansion of the right lung. He was started on Amiodarone for a fib. Pigtail did not have an air leak. It was put to water seal on 07/06.He had ABL anemia. He did require a post op transfusion. Last H and H was 9.5 and 30.1. He was weaned off the insulin drip.  The patient's HGA1C pre op was  6.1. He is likely pre diabetic. He will require further surveillance as an outpatient by the medical doctor. The patient was felt surgically stable for transfer from the ICU to PCTU for further convalescence on 04/06/2017. He continues to progress with cardiac rehab. He was ambulating on room air. He has been tolerating a diet and has had a bowel movement. Epicardial pacing wires were removed on 04/08/2017. Chest tube sutures will be removed the day of discharge. The patient is felt surgically stable for discharge today.   Latest Vital Signs: Blood pressure 104/60, pulse 79, temperature 98.1 F (36.7 C), temperature source Oral, resp. rate 16, height 5' 7.5" (1.715 m), weight 189 lb 14.4 oz (86.1 kg), SpO2 95 %.  Physical Exam: General appearance: alert, cooperative and no distress Heart: regular rate and rhythm Lungs: min basilar crackles Abdomen: benign Extremities: no edema Wound: incis healing well    Discharge Condition: Stable and discharged to home.   Recent laboratory studies:  Lab Results  Component Value Date   WBC 8.2 04/10/2017   HGB 10.0 (L) 04/10/2017   HCT 31.9 (L)  04/10/2017   MCV 89.9 04/10/2017   PLT 605 (H) 04/10/2017   Lab Results  Component Value Date   NA 131 (L) 04/10/2017   K 4.5 04/10/2017   CL 98 (L) 04/10/2017   CO2 26 04/10/2017   CREATININE 1.13 04/10/2017   GLUCOSE 106 (H) 04/10/2017    Diagnostic Studies:  Dg Chest Port 1 View  Result Date: 04/06/2017 CLINICAL DATA:  Atelectasis EXAM: PORTABLE CHEST 1 VIEW COMPARISON:  04/05/2017 FINDINGS: Left lower lobe atelectasis/ infiltrate unchanged. Resolution of pleural fluid in the left apex. Mild right lower lobe atelectasis unchanged. CABG. Negative for heart failure or edema. Central line tip in the lower right atrium unchanged in position. IMPRESSION: Left lower lobe atelectasis/infiltrate unchanged. Left apical pleural effusion improved Mild right lower lobe atelectasis unchanged. Electronically Signed   By: Franchot Gallo M.D.   On: 04/06/2017 07:43  Discharge Instructions    Amb Referral to Cardiac Rehabilitation    Complete by:  As directed    Diagnosis:   CABG NSTEMI     CABG X ___:  2      Discharge Medications: Allergies as of 04/10/2017      Reactions   No Known Allergies       Medication List    TAKE these  medications   amiodarone 200 MG tablet Commonly known as:  PACERONE Take 1 tablet (200 mg total) by mouth daily.   aspirin 325 MG EC tablet Take 1 tablet (325 mg total) by mouth daily.   carvedilol 3.125 MG tablet Commonly known as:  COREG Take 1 tablet (3.125 mg total) by mouth 2 (two) times daily with a meal.   clotrimazole-betamethasone cream Commonly known as:  LOTRISONE APPLY TO AFFECED AREA LOCALLY TWICE DAILY AS NEEDED FOR RASH   digoxin 0.125 MG tablet Commonly known as:  LANOXIN Take 1 tablet (0.125 mg total) by mouth daily.   gabapentin 100 MG capsule Commonly known as:  NEURONTIN Take 2 capsules (200 mg total) by mouth 3 (three) times daily.   losartan 25 MG tablet Commonly known as:  COZAAR Take 0.5 tablets (12.5 mg total) by mouth at  bedtime.   oxyCODONE 5 MG immediate release tablet Commonly known as:  Oxy IR/ROXICODONE Take 1 tablet (5 mg total) by mouth every 6 (six) hours as needed for severe pain.   rosuvastatin 40 MG tablet Commonly known as:  CRESTOR Take 40 mg by mouth daily.   spironolactone 25 MG tablet Commonly known as:  ALDACTONE Take 0.5 tablets (12.5 mg total) by mouth daily.      The patient has been discharged on:   1.Beta Blocker:  Yes [  y ]                              No   [   ]                              If No, reason:  2.Ace Inhibitor/ARB: Yes [ y  ]                                     No  [    ]                                     If No, reason:  3.Statin:   Yes [  x ]                  No  [   ]                  If No, reason:  4.Ecasa:  Yes  [ x  ]                  No   [   ]                  If No, reason:  Follow Up Appointments:  Contact information for follow-up providers    [name], MD Follow up on 05/06/2017.   Specialty:  Cardiothoracic Surgery Why:  PA/LAT CXR to be taken (at [facility] which is in the same building as Dr. Lucianne Lei Trigt's office) on 05/06/2017 at 1:00 pm;Appointment time is at 1:30 pm Contact information: [place] [address] [phone]        Miquel Dunn, NP Follow up on 05/01/2017.   Specialty:  Cardiology Why:  Appointment time is at 2:00 pm Contact information:  [place] [address] [phone]        Bensimhon, Shaune Pascal, MD Follow up on 05/11/2017.   Specialty:  Cardiology Why:  9:40 [address] information: [place] [address] [phone]            Contact information for after-discharge care    Destination    [facility] .   Specialty:  Skilled Nursing Facility Contact information: [address] [place] [phone]                   Signed: Macy Mis 04/10/2017, 8:13 AM

## 2017-04-08 NOTE — Progress Notes (Signed)
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  Extubated 7/3 with Impella removed as well.     PT Comments    Pt is making good progress towards his goals. Pt is supervision for transfers and ambulation of 700 feet with RW. Pt still requires occassional verbal cuing to his sternal precautions with getting in and out of the chair. Pt requires skilled PT to continue to progress his mobility and improve LE strength and endurance to safely mobilize in his discharge environment.    Follow Up Recommendations  SNF;Supervision/Assistance - 24 hour     Equipment Recommendations  Other (comment) (TBA)    Recommendations for Other Services OT consult     Precautions / Restrictions Precautions Precautions: Fall;Sternal Precaution Comments: pt required cues to maintain precautions throughout session  Restrictions Weight Bearing Restrictions: No    Mobility  Bed Mobility               General bed mobility comments: pt in recliner at entry  Transfers Overall transfer level: Needs assistance Equipment used: Rolling walker (2 wheeled) Transfers: Sit to/from Stand Sit to Stand: Supervision         General transfer comment: cues for hand placement to maintain precautions  Ambulation/Gait Ambulation/Gait assistance: Supervision Ambulation Distance (Feet): 700 Feet Assistive device: Rolling walker (2 wheeled) Gait Pattern/deviations: Step-through pattern;Trunk flexed Gait velocity: decreased Gait velocity interpretation: Below normal speed for age/gender General Gait Details: cues for upright posture as pt progressed with ambulation        Balance Overall balance assessment: Needs assistance Sitting-balance support: No upper  extremity supported;Feet supported Sitting balance-Leahy Scale: Good     Standing balance support: During functional activity Standing balance-Leahy Scale: Fair                              Cognition Arousal/Alertness: Awake/alert Behavior During Therapy: Flat affect Overall Cognitive Status: Within Functional Limits for tasks assessed                                           General Comments General comments (skin integrity, edema, etc.): SaO2 on RA throughout session >93%O2      Pertinent Vitals/Pain Pain Assessment: Faces Faces Pain Scale: Hurts a little bit Pain Location: incisional Pain Descriptors / Indicators: Operative site guarding;Sore Pain Intervention(s): Monitored during session  VSS           PT Goals (current goals can now be found in the care plan section) Acute Rehab PT Goals Patient Stated Goal: to go home PT Goal Formulation: With patient Time For Goal Achievement: 04/15/17 Potential to Achieve Goals: Good Progress towards PT goals: Progressing toward goals    Frequency    Min 3X/week      PT Plan Current plan remains appropriate       AM-PAC PT "6 Clicks" Daily Activity  Outcome Measure  Difficulty turning over in bed (including adjusting bedclothes, sheets and blankets)?: A Lot Difficulty moving from lying on back to sitting on the side of the bed? : A Lot Difficulty sitting down on and standing up from a chair with arms (  e.g., wheelchair, bedside commode, etc,.)?: A Lot Help needed moving to and from a bed to chair (including a wheelchair)?: A Little Help needed walking in hospital room?: A Little Help needed climbing 3-5 steps with a railing? : A Lot 6 Click Score: 14    End of Session Equipment Utilized During Treatment: Gait belt Activity Tolerance: Patient tolerated treatment well Patient left: with call bell/phone within reach;in bed Nurse Communication: Mobility status PT Visit Diagnosis:  Unsteadiness on feet (R26.81);Muscle weakness (generalized) (M62.81);Pain Pain - part of body:  (incisional )     Time: 9518-8416 PT Time Calculation (min) (ACUTE ONLY): 18 min  Charges:  $Gait Training: 8-22 mins                    G Codes:       Zachry Hopfensperger B. Migdalia Dk PT, DPT Acute Rehabilitation  [phone] Pager [phone]     [place] 04/08/2017, 4:40 PM

## 2017-04-08 NOTE — Assessment & Plan Note (Signed)
Left internal mammary artery to LAD, saphenous vein graft to circumflex marginal), closure of patent foramen ovale and placement of percutaneous left ventricular assist device, Impella LD via 10-mm Hemashield graft sewn to the ascending aorta.

## 2017-04-08 NOTE — Progress Notes (Signed)
Patient education completed fro removal of pacer wires, 4 pacer wires removed as ordered, patient now on bedrest will continue to monitor.  

## 2017-04-08 NOTE — Clinical Social Work Note (Signed)
CSW provided bed offers to patient and his wife: Luis [name] and [place]. CSW explained that insurance likely will not approve due to ambulation distance. Patient walked 350 feet in PT on 7/9 and 780 feet in cardiac rehab today. Patient's wife asked that CSW check into CIR. CSW discussed with admissions coordinator who stated that patient is not a candidate due to ambulation distance. CSW notified patient and his wife. They asked how much it is because they will private pay. CSW told them it was thousands of dollars and for SNF it is at least $7000 per month. They asked about home health and CSW explained that insurance would cover that. Patient's wife will discuss with her son and let CSW know. RNCM aware.  Dayton Scrape, [place]

## 2017-04-08 NOTE — Progress Notes (Signed)
CARDIAC REHAB PHASE I   PRE:  Rate/Rhythm: 90 SR    BP: sitting 109/61    SaO2: 95 RA  MODE:  Ambulation: 780 ft   POST:  Rate/Rhythm: 105 ST    BP: sitting 121/76     SaO2: 96 RA   Pt moving independently with RW. I pushed IV pole. No c/o, steady, feels well. He has been walking x4 qd. No rest needed. To BR for BM then return to recliner. He will need RW for d/c. To walk more later, encouraged IS. [phone]  [place], ACSM 04/08/2017 10:55 AM

## 2017-04-08 NOTE — Progress Notes (Signed)
      [address]       [place] [id number]             [phone]      8 Days Post-Op Procedure(s) (LRB): REMOVAL OF IMPELLA LEFT VENTRICULAR ASSIST DEVICE (N/A) TRANSESOPHAGEAL ECHOCARDIOGRAM (TEE) (N/A) Subjective: Looks and feels well  Objective: Vital signs in last 24 hours: Temp:  [98.1 F (36.7 C)-99.1 F (37.3 C)] 98.5 F (36.9 C) (07/11 0639) Pulse Rate:  [88] 88 (07/11 0639) Cardiac Rhythm: Normal sinus rhythm (07/11 0700) Resp:  [18-22] 22 (07/10 2212) BP: (97-108)/(58-63) 108/63 (07/10 2212) SpO2:  [93 %-98 %] 93 % (07/11 0639) Weight:  [179 lb 6.4 oz (81.4 kg)] 179 lb 6.4 oz (81.4 kg) (07/11 0639)  Hemodynamic parameters for last 24 hours:    Intake/Output from previous day: 07/10 0701 - 07/11 0700 In: 1470.8 [P.O.:1430; I.V.:40.8] Out: 2676 [Urine:2675; Stool:1] Intake/Output this shift: No intake/output data recorded.  General appearance: alert, cooperative and no distress Heart: regular rate and rhythm Lungs: min basilar crackles Abdomen: benign Extremities: no edema Wound: incis healing well  Lab Results:  Recent Labs  04/06/17 0417 04/07/17 0459  WBC 9.6 10.0  HGB 9.0* 9.5*  HCT 28.3* 30.1*  PLT 500* 560*   BMET:  Recent Labs  04/07/17 0459 04/08/17 0256  NA 132* 132*  K 4.4 4.7  CL 97* 97*  CO2 27 27  GLUCOSE 115* 105*  BUN 12 12  CREATININE 1.26* 1.16  CALCIUM 8.8* 8.9    PT/INR: No results for input(s): LABPROT, INR in the last 72 hours. ABG    Component Value Date/Time   PHART 7.535 (H) 04/02/2017 0429   HCO3 20.7 04/02/2017 0429   TCO2 27 04/05/2017 1758   ACIDBASEDEF 1.0 04/02/2017 0429   O2SAT 56.6 04/08/2017 0514   CBG (last 3)   Recent Labs  04/07/17 1643 04/07/17 2201 04/08/17 0635  GLUCAP 132* 103* 107*    Meds Scheduled Meds: . amiodarone  200 mg Oral Daily  . aspirin EC  325 mg Oral Daily   Or  . aspirin  324 mg Per Tube Daily  . digoxin  0.125 mg Oral Daily  . enoxaparin  (LOVENOX) injection  30 mg Subcutaneous Q24H  . gabapentin  200 mg Oral TID  . insulin aspart  0-15 Units Subcutaneous TID WC  . pantoprazole  40 mg Oral Q1200  . rosuvastatin  40 mg Oral q1800  . sodium chloride flush  10-40 mL Intracatheter Q12H  . sodium chloride flush  3 mL Intravenous Q12H  . spironolactone  12.5 mg Oral Daily   Continuous Infusions: . sodium chloride 10 mL/hr at 04/06/17 1945  . milrinone 0.125 mcg/kg/min (04/06/17 0700)  . potassium chloride 10 mEq (04/02/17 0751)   PRN Meds:.levalbuterol, ondansetron (ZOFRAN) IV, oxyCODONE, phenol, potassium chloride, sodium chloride flush, sodium chloride flush, traMADol  Xrays No results found.  Assessment/Plan: S/P Procedure(s) (LRB): REMOVAL OF IMPELLA LEFT VENTRICULAR ASSIST DEVICE (N/A) TRANSESOPHAGEAL ECHOCARDIOGRAM (TEE) (N/A)  1 doing very well 2 CO-Ox 56- hopefully d/c milrinone today- AHF team managing  3 sugars pretty well controlled- dietary restrictions only 4 creat improved  5 d/c epw's 6 routine rehab and pulm toilet  LOS: 14 days    GOLD,WAYNE E 04/08/2017

## 2017-04-08 NOTE — Discharge Instructions (Signed)

## 2017-04-09 LAB — BASIC METABOLIC PANEL
Anion gap: 8 (ref 5–15)
BUN: 13 mg/dL (ref 6–20)
CO2: 27 mmol/L (ref 22–32)
Calcium: 8.5 mg/dL — ABNORMAL LOW (ref 8.9–10.3)
Chloride: 99 mmol/L — ABNORMAL LOW (ref 101–111)
Creatinine, Ser: 1.05 mg/dL (ref 0.61–1.24)
GFR calc Af Amer: >60 mL/min (ref >60)
GFR calc non Af Amer: >60 mL/min (ref >60)
Glucose, Bld: 109 mg/dL — ABNORMAL HIGH (ref 65–99)
Potassium: 4.4 mmol/L (ref 3.5–5.1)
Sodium: 134 mmol/L — ABNORMAL LOW (ref 135–145)

## 2017-04-09 LAB — COOXEMETRY PANEL
Carboxyhemoglobin: 1.2 % (ref 0.5–1.5)
Methemoglobin: 1.1 % (ref 0.0–1.5)
O2 Saturation: 64.4 %
Total hemoglobin: 10.2 g/dL — ABNORMAL LOW (ref 12.0–16.0)

## 2017-04-09 LAB — CBC
HCT: 31.8 % — ABNORMAL LOW (ref 39.0–52.0)
Hemoglobin: 10.2 g/dL — ABNORMAL LOW (ref 13.0–17.0)
MCH: 28.6 pg (ref 26.0–34.0)
MCHC: 32.1 g/dL (ref 30.0–36.0)
MCV: 89.1 fL (ref 78.0–100.0)
Platelets: 625 10*3/uL — ABNORMAL HIGH (ref 150–400)
RBC: 3.57 MIL/uL — ABNORMAL LOW (ref 4.22–5.81)
RDW: 15.3 % (ref 11.5–15.5)
WBC: 9.6 10*3/uL (ref 4.0–10.5)

## 2017-04-09 LAB — GLUCOSE, CAPILLARY
Glucose-Capillary: 113 mg/dL — ABNORMAL HIGH (ref 65–99)
Glucose-Capillary: 118 mg/dL — ABNORMAL HIGH (ref 65–99)
Glucose-Capillary: 98 mg/dL (ref 65–99)
Glucose-Capillary: 131 mg/dL — ABNORMAL HIGH (ref 65–99)

## 2017-04-09 LAB — MAGNESIUM: Magnesium: 2.3 mg/dL (ref 1.7–2.4)

## 2017-04-09 MED ORDER — CARVEDILOL 3.125 MG PO TABS
3.1250 mg | ORAL_TABLET | Freq: Two times a day (BID) | ORAL | Status: DC
  Administered 2017-04-10: 3.125 mg via ORAL
  Filled 2017-04-09: qty 1

## 2017-04-09 NOTE — Evaluation (Signed)
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  Extubated 7/3 with Impella removed as well.    Clinical Impression   Pt admitted with above. He demonstrates the below listed deficits.  He currently requires supervision for basic ADLs with DOE 2/4 and HR 98 with activity.  He was very independent PTA, and would benefit from a short stint of continued OT in next venue to address safety, endurance, and independence with IADLs.  All further OT needs can be addressed in next venue of care.  Acute OT will sign off.       Follow Up Recommendations  Supervision/Assistance - 24 hour;SNF    Equipment Recommendations  None recommended by OT;3 in 1 bedside commode    Recommendations for Other Services       Precautions / Restrictions Precautions Precautions: Fall;Sternal Precaution Comments: Pt requires min cues for precautions       Mobility Bed Mobility               General bed mobility comments: Pt sitting up in recliner   Transfers Overall transfer level: Needs assistance Equipment used: Rolling walker (2 wheeled) Transfers: Sit to/from [employer] Transfers Sit to Stand: Supervision Stand pivot transfers: Supervision            Balance Overall balance assessment: Needs assistance Sitting-balance support: No upper extremity supported;Feet supported Sitting balance-Leahy Scale: Good     Standing balance support: During functional activity Standing balance-Leahy Scale: Fair                             ADL either performed or assessed with clinical judgement   ADL Overall ADL's : Needs assistance/impaired Eating/Feeding: Independent   Grooming: Wash/dry hands;Wash/dry face;Oral care;Brushing  hair;Supervision/safety;Standing Grooming Details (indicate cue type and reason): Pt reports he performed grooming at sink earlier today  Upper Body Bathing: Supervision/ safety;Set up;Sitting   Lower Body Bathing: Supervison/ safety;Sit to/from stand   Upper Body Dressing : Supervision/safety;Set up;Sitting Upper Body Dressing Details (indicate cue type and reason): simulated.  Instructed pt and son how to safely don shirt with sternal precautions  Lower Body Dressing: Supervision/safety;Sit to/from stand   Toilet Transfer: Supervision/safety;Ambulation;Comfort height toilet;RW   Toileting- Clothing Manipulation and Hygiene: Supervision/safety;Sit to/from stand       Functional mobility during ADLs: Supervision/safety;Rolling walker General ADL Comments: DOE 2/4 with ADLs      Vision         Perception     Praxis      Pertinent Vitals/Pain Pain Assessment: 0-10 Pain Score: 6  Pain Location: incisional Pain Descriptors / Indicators: Aching Pain Intervention(s): Monitored during session     Hand Dominance Right   Extremity/Trunk Assessment Upper Extremity Assessment Upper Extremity Assessment: Generalized weakness   Lower Extremity Assessment Lower Extremity Assessment: Defer to PT evaluation   Cervical / Trunk Assessment Cervical / Trunk Assessment: Kyphotic   Communication Communication Communication: No difficulties   Cognition Arousal/Alertness: Awake/alert Behavior During Therapy: WFL for tasks assessed/performed Overall Cognitive Status: Within Functional Limits for tasks assessed  General Comments  resting HR 88.  Working HR 98    Exercises     Shoulder Instructions      Home Living Family/patient expects to be discharged to:: Skilled nursing facility Living Arrangements: Spouse/significant other Available Help at Discharge: Family;Available 24 hours/day Type of Home: House Home Access: Stairs  to enter [employer] of Steps: 3 Entrance Stairs-Rails: Right;Left;Can reach both Home Layout: One level     Bathroom Shower/Tub: Tub/shower unit         Home Equipment: None   Additional Comments: Pt reports he is retired.       Prior Functioning/Environment Level of Independence: Independent        Comments: Pt is independent in the community         OT Problem List: Decreased strength;Decreased activity tolerance;Decreased knowledge of precautions;Decreased knowledge of use of DME or AE;Pain      OT Treatment/Interventions:      OT Goals(Current goals can be found in the care plan section) Acute Rehab OT Goals Patient Stated Goal: to get better  OT Goal Formulation: All assessment and education complete, DC therapy  OT Frequency:     Barriers to D/C:            Co-evaluation              AM-PAC PT "6 Clicks" Daily Activity     Outcome Measure Help from another person eating meals?: None Help from another person taking care of personal grooming?: A Little Help from another person toileting, which includes using toliet, bedpan, or urinal?: A Little Help from another person bathing (including washing, rinsing, drying)?: A Little Help from another person to put on and taking off regular upper body clothing?: A Little Help from another person to put on and taking off regular lower body clothing?: A Little 6 Click Score: 19   End of Session Equipment Utilized During Treatment: Rolling walker Nurse Communication: Mobility status  Activity Tolerance: Patient tolerated treatment well Patient left: in chair;with call bell/phone within reach;with family/visitor present  OT Visit Diagnosis: Unsteadiness on feet (R26.81)                Time: 2336-1224 OT Time Calculation (min): 29 min Charges:  OT General Charges $OT Visit: 1 Procedure OT Evaluation $OT Eval Moderate Complexity: 1 Procedure OT Treatments $Self Care/Home Management : 8-22  mins G-Codes:     [facility], OTR/L [phone]   Ladonna Snide, Burnell Hurta M 04/09/2017, 2:28 PM

## 2017-04-09 NOTE — Care Management Note (Addendum)
Case Management Note Marvetta Gibbons RN, BSN Unit 2W-Case Manager-- [place] coverage [phone]  Patient Details  Name: Luis [name] MRN: [id number] Date of Birth: 1945/11/18  Subjective/Objective:   Pt admitted with Acute anterior MI with cardiogenic shock following attempted PCI of chronically occluded LAD with dissection of left main. Patient developed hypotension requiring norepinephrine and metabolic acidosis in the cath lab and was intubated-- pt had emergent CABGx2 on 03/25/17                 Action/Plan: PTA pt lived lived at home with wife- CM to follow for d/c needs  Expected Discharge Date:                  Expected Discharge Plan:  [place]  In-House Referral:  Clinical Social Work  Discharge planning Services  CM Consult  Post Acute Care Choice:    Choice offered to:     DME Arranged:    DME Agency:     HH Arranged:    [place] Agency:     Status of Service:  In process, will continue to follow  If discussed at Long Length of Stay Meetings, dates discussed:  7/10, 7/12  Discharge Disposition:   Additional Comments:  04/09/17- 1500- Marvetta Gibbons RN, CM - pt has progressed well post op- family interested in rehab/SNF, CSW has been consulted- CIR consulted- pt is not a candidate for CIR- per CSW insurance has denied for STSNF- attending team to do peer-2-peer - if not approved then plan for home with [facility]- CM has given family/son- info on [facility] agencies in [facility] along with Private Duty list if pt is not approved for rehab stay. If pt returns home would benefit from RW.  CSW and CM will continue to follow.   Update-1627- received word from [place] that peer to peer with insurance has been denied for SNF - spoke with pt and wife at bedside to see if any choice had been made to for [facility] services- both pt and wife state that son was going to speak with MD and he would be reviewing list that were provided for choice- CM will f/u prior to d/c for [facility] choice if [facility]  services are ordered for discharge.    Dawayne Patricia, RN 04/09/2017, 3:11 PM

## 2017-04-09 NOTE — Clinical Social Work Note (Addendum)
CSW faxed clinicals to [facility]. SNF choice is [facility]. Admissions coordinator notified.  Dayton Scrape, CSW [phone]  1:39 pm [facility] stated they did not receive the clinicals. CSW received a successful fax notice an hour ago. CSW started a verbal authorization and faxed clinicals again.  Dayton Scrape, [place] [phone]  3:07 pm Received call from [facility]. Patient's case will require peer-to-peer review. Attending is not on site today. PA is agreeable to completing peer-to-peer review. CSW notified pre-service coordinator, Apolonio Schneiders.  Dayton Scrape, CSW [phone]  4:23 pm [employer] has denied authorization to SNF. CSW notified patient and his wife. [employer] will send paperwork to CSW for patient to sign regarding the denial process. CSW has not received it yet but will provide the paperwork when available.  Dayton Scrape, [place]

## 2017-04-09 NOTE — Progress Notes (Addendum)
CARDIAC REHAB PHASE I   Ed completed with pt and wife. Voiced understanding. Discussed HF booklet and management as well. Will refer to [facility]. Pt declined ambulation, sts he is in pain today. Pt needed to move up in bed therefore I had him roll out of bed, coaching on correct mechanics, take steps up to head of bed, then roll back into bed. Reinforced that pt is moving well/doing well. Wife is very anxious and refusing for pt to d/c today. Would like 2 more days since he is in pain and he has heart damage. Discussed situation with CM. Pt sts he will walk in a little bit with his wife. I encouraged walks x3 if no d/c today. [phone]  [place], ACSM 04/09/2017 11:51 AM

## 2017-04-09 NOTE — Progress Notes (Addendum)
Physical medicine rehabilitation consult requested chart reviewed. Patient moving independently greater than 700 feet supervision. Patient will not need inpatient rehabilitation services. Hold on formal rehabilitation consult at this time

## 2017-04-09 NOTE — Progress Notes (Signed)
Advanced Heart Failure Rounding Note  Primary Cardiologist: Dr [name]  Subjective:    S/p emergent CABG x2 and Impella placement 03/25/17 for LM dissection  Impella removed in OR 7/3. EF 30-35% in OR RV mild HK   Developed acute resp distress with removal of chest tube 04/02/17. STAT CXR with large pneumothorax with developing tension.  Dr. Madolyn Frieze emergently placed pigtail  with resolution.   04/05/17 milrinone was cut back to 0.125 mcg .   Stable off milrinone. Co-ox 64%. Yesterday losartan added at bed time. Ambulated 780 feet.   Complaining of chest discomfort when he coughs. Denies SOB. His wife is concerned about discharge.    Objective:   Weight Range: 78.4 kg (172 lb 12.8 oz) Body mass index is 26.66 kg/m.   Vital Signs:   Temp:  [98.1 F (36.7 C)-98.8 F (37.1 C)] 98.1 F (36.7 C) (07/12 0815) Pulse Rate:  [84-86] 86 (07/12 0815) Resp:  [18-23] 21 (07/12 0815) BP: (99-115)/(60-78) 99/61 (07/12 0815) SpO2:  [95 %-98 %] 97 % (07/12 0815) Weight:  [78.4 kg (172 lb 12.8 oz)] 78.4 kg (172 lb 12.8 oz) (07/12 0641) Last BM Date: 04/08/17  Weight change: Filed Weights   04/07/17 0500 04/08/17 0639 04/09/17 0641  Weight: 81.1 kg (178 lb 12.8 oz) 81.4 kg (179 lb 6.4 oz) 78.4 kg (172 lb 12.8 oz)    Intake/Output:   Intake/Output Summary (Last 24 hours) at 04/09/17 0917 Last data filed at 04/08/17 2342  Gross per 24 hour  Intake              240 ml  Output              750 ml  Net             -510 ml      Physical Exam   General:  Well appearing. No resp difficulty. Sitting in the chair.  HEENT: normal Neck: supple. JVP 5-6. Carotids 2+ bilat; no bruits. No lymphadenopathy or thryomegaly appreciated. Cor: PMI nondisplaced. Regular rate & rhythm. No rubs, gallops or murmurs. Lungs: clear Abdomen: soft, nontender, nondistended. No hepatosplenomegaly. No bruits or masses. Good bowel sounds. Extremities: no cyanosis, clubbing, rash, edema Neuro: alert &  orientedx3, cranial nerves grossly intact. moves all 4 extremities w/o difficulty. Affect pleasant    Telemetry   NSR 80s personally reviewed. NSR 80s. Personally reviewed.   EKG    N/A  Labs    CBC  Recent Labs  04/07/17 0459 04/09/17 0324  WBC 10.0 9.6  HGB 9.5* 10.2*  HCT 30.1* 31.8*  MCV 91.2 89.1  PLT 560* 381*   Basic Metabolic Panel  Recent Labs  04/08/17 0256 04/09/17 0324  NA 132* 134*  K 4.7 4.4  CL 97* 99*  CO2 27 27  GLUCOSE 105* 109*  BUN 12 13  CREATININE 1.16 1.05  CALCIUM 8.9 8.5*  MG  --  2.3   Liver Function Tests  Recent Labs  04/07/17 0459  AST 22  ALT 20  ALKPHOS 54  BILITOT 1.0  PROT 6.2*  ALBUMIN 2.9*   No results for input(s): LIPASE, AMYLASE in the last 72 hours. Cardiac Enzymes No results for input(s): CKTOTAL, CKMB, CKMBINDEX, TROPONINI in the last 72 hours.  BNP: BNP (last 3 results) No results for input(s): BNP in the last 8760 hours.  ProBNP (last 3 results) No results for input(s): PROBNP in the last 8760 hours.   D-Dimer No results for input(s):  DDIMER in the last 72 hours. Hemoglobin A1C No results for input(s): HGBA1C in the last 72 hours. Fasting Lipid Panel No results for input(s): CHOL, HDL, LDLCALC, TRIG, CHOLHDL, LDLDIRECT in the last 72 hours. Thyroid Function Tests No results for input(s): TSH, T4TOTAL, T3FREE, THYROIDAB in the last 72 hours.  Invalid input(s): FREET3  Other results:   Imaging    No results found.   Medications:     Scheduled Medications: . amiodarone  200 mg Oral Daily  . aspirin EC  325 mg Oral Daily   Or  . aspirin  324 mg Per Tube Daily  . digoxin  0.125 mg Oral Daily  . enoxaparin (LOVENOX) injection  40 mg Subcutaneous Q24H  . gabapentin  200 mg Oral TID  . insulin aspart  0-15 Units Subcutaneous TID WC  . losartan  12.5 mg Oral QHS  . pantoprazole  40 mg Oral Q1200  . rosuvastatin  40 mg Oral q1800  . sodium chloride flush  10-40 mL Intracatheter Q12H    . sodium chloride flush  3 mL Intravenous Q12H  . spironolactone  12.5 mg Oral Daily    Infusions: . sodium chloride 10 mL/hr at 04/06/17 1945    PRN Medications: levalbuterol, ondansetron (ZOFRAN) IV, oxyCODONE, phenol, sodium chloride flush, sodium chloride flush, traMADol    Patient Profile   Luis [name] is 89 year admitted after LHC complicated by Left main dissection, acute MI, cardiogenic shock requiring urgent CABG x2 with impella 5.0 LD on 03/25/17  Assessment/Plan    1. Cardiogenic Shock/acute systolic heart failure in the setting of acute MI due to LM dissection - s/p emergent CABG 03/25/17 with insertion Impella LD 5.0  - s/p Impella removal 7/3 - EF 30-35% by intra-op TEE on 7/3 - Stable off milrinone. Volume status stable. Continue lasix 40 mg BID.  - Continue spiro 12.5 mg daily. K 4.4   - Continue digoxin 0.125 mg daily.  - No BB with marginal mixed venous sat.  - Continue  12.5 mg losartan at bed time. .    2. 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 - s/p CABG. No S/S ischemia. . - Continue ASA and statin. No change.    3. Acute respiratory failure - Extubated 6/29.  - Re-intubated in OR on 7/3. Now extubated - s/p PTX 7/5 with chest tube removal.  Resolved with emergent replacement of chest tube by Dr. Servando Snare. Chest tube now out.  - Resolved. Now stable on room air.    4. Acute blood loss anemia - Transfused 2u RBCs on 6/30. Hgb stable.    5. AKI - Creatinine down to 1.05.     6. Hypokalemia/Hypomag - Keep K >4.0 and Mg > 2.0.  - K 4.4 Mag 2.3   7. ID - No s/s of infection Finished course of ceftaz for ? Aspiration.  Afebrile.   8. PAF, post-op (transient) - Maintaining NSR. Continue amio 200 mg daily  Disposition- SNF versus HH.   Ok for D/C  HF meds Lasix 40 mg twice a day Dig 0.125 mg daily Spiro 12.5 mg daily Losartan 12.5 mg Amio 200 mg daily (can stop in one month) Carvedilol 3.125 bid Rosuvastatin 40 daily  Length of Stay:  [place], NP  04/09/2017, 9:17 AM  Advanced Heart Failure Team Pager [phone] (M-F; 7a - 4p)  Please contact [name] for night-coverage after hours (4p -7a ) and weekends on [email or web address]  Patient seen and examined with Darrick Grinder,  NP. We discussed all aspects of the encounter. I agree with the assessment and plan as stated above.   He continues to improve. MV sat 64% off inotropes. Volume status looks good.   Wife hesitant to take him home. Long talk with her, Luis. [name] and their son. Assured them that he was safe to go. Discussed with Luis [name]. HHRN will visit them on Saturday to make sure they are ok. Will also arrange HHPT. D/w Dr. Roxy Manns who agrees.  D/c meds as above. Will add low dose b-blocker. Will arrange f/u with [facility] as well as Dr. [name].   Glori Bickers, MD  10:03 PM

## 2017-04-09 NOTE — Progress Notes (Signed)
Patient's family, wife states that she is afraid to take him home as she lives in a small motel, worried about infection, worried about his mobility and given him bath, making him walk.  States that she would prefer him to be in the rehabilitation for at least 3-4 days without monitors and see how he does and then take him home safely.  No other supporting family members present or live with him.Marland Kitchen

## 2017-04-09 NOTE — Progress Notes (Signed)
OT Cancellation Note  Patient Details Name: Luis [name] MRN: [id number] DOB: 01/30/1946   Cancelled Treatment:    Reason Eval/Treat Not Completed: Pt eating lunch.  Will reattempt.  [employer], OTR/L [phone]  Lucille Passy M 04/09/2017, 12:21 PM

## 2017-04-09 NOTE — Progress Notes (Addendum)
[address]       [place] [address]             [phone]      9 Days Post-Op Procedure(s) (LRB): REMOVAL OF IMPELLA LEFT VENTRICULAR ASSIST DEVICE (N/A) TRANSESOPHAGEAL ECHOCARDIOGRAM (TEE) (N/A) Subjective: Feels pretty well, wife is very anxious about discharge because she is alone with him. [facility] arrangements are being made- does not qualify for SNF or CIR  Objective: Vital signs in last 24 hours: Temp:  [98.6 F (37 C)-98.8 F (37.1 C)] 98.6 F (37 C) (07/12 0641) Pulse Rate:  [84] 84 (07/11 2100) Cardiac Rhythm: Normal sinus rhythm (07/11 1900) Resp:  [18-23] 18 (07/12 0300) BP: (100-115)/(60-78) 107/68 (07/12 0641) SpO2:  [95 %-98 %] 95 % (07/12 0641) Weight:  [172 lb 12.8 oz (78.4 kg)] 172 lb 12.8 oz (78.4 kg) (07/12 0641)  Hemodynamic parameters for last 24 hours:    Intake/Output from previous day: 07/11 0701 - 07/12 0700 In: 240 [P.O.:240] Out: 750 [Urine:750] Intake/Output this shift: No intake/output data recorded.  General appearance: alert, cooperative and no distress Heart: regular rate and rhythm Lungs: clear to auscultation bilaterally Abdomen: soft, non-tender; bowel sounds normal; no masses,  no organomegaly Extremities: no edema Wound: incis healing well  Lab Results:  Recent Labs  04/07/17 0459 04/09/17 0324  WBC 10.0 9.6  HGB 9.5* 10.2*  HCT 30.1* 31.8*  PLT 560* 625*   BMET:  Recent Labs  04/08/17 0256 04/09/17 0324  NA 132* 134*  K 4.7 4.4  CL 97* 99*  CO2 27 27  GLUCOSE 105* 109*  BUN 12 13  CREATININE 1.16 1.05  CALCIUM 8.9 8.5*    PT/INR: No results for input(s): LABPROT, INR in the last 72 hours. ABG    Component Value Date/Time   PHART 7.535 (H) 04/02/2017 0429   HCO3 20.7 04/02/2017 0429   TCO2 27 04/05/2017 1758   ACIDBASEDEF 1.0 04/02/2017 0429   O2SAT 64.4 04/09/2017 0325   CBG (last 3)   Recent Labs  04/08/17 1630 04/08/17 2113 04/09/17 0638  GLUCAP 142* 132* 113*     Meds Scheduled Meds: . amiodarone  200 mg Oral Daily  . aspirin EC  325 mg Oral Daily   Or  . aspirin  324 mg Per Tube Daily  . digoxin  0.125 mg Oral Daily  . enoxaparin (LOVENOX) injection  40 mg Subcutaneous Q24H  . gabapentin  200 mg Oral TID  . insulin aspart  0-15 Units Subcutaneous TID WC  . losartan  12.5 mg Oral QHS  . pantoprazole  40 mg Oral Q1200  . rosuvastatin  40 mg Oral q1800  . sodium chloride flush  10-40 mL Intracatheter Q12H  . sodium chloride flush  3 mL Intravenous Q12H  . spironolactone  12.5 mg Oral Daily   Continuous Infusions: . sodium chloride 10 mL/hr at 04/06/17 1945   PRN Meds:.levalbuterol, ondansetron (ZOFRAN) IV, oxyCODONE, phenol, sodium chloride flush, sodium chloride flush, traMADol  Xrays No results found.  Assessment/Plan: S/P Procedure(s) (LRB): REMOVAL OF IMPELLA LEFT VENTRICULAR ASSIST DEVICE (N/A) TRANSESOPHAGEAL ECHOCARDIOGRAM (TEE) (N/A)  1 doing well, hemodyn stable in sinus , Co-Ox 64 off milrinone 2 H/H stable 3 creat improved 4 SW assisting- he appears stable for discharge but they want to speak to MD about it.      LOS: 15 days    GOLD,WAYNE E 04/09/2017   I have seen and examined the patient and agree with  the assessment and plan as outlined.  Patient looks quite good and ready for d/c home.  Discussed expectations with patient and his wife at bedside.  All questions answered  Rexene Alberts, MD 04/09/2017 10:17 AM

## 2017-04-10 LAB — BASIC METABOLIC PANEL
Anion gap: 7 (ref 5–15)
BUN: 13 mg/dL (ref 6–20)
CO2: 26 mmol/L (ref 22–32)
Calcium: 8.5 mg/dL — ABNORMAL LOW (ref 8.9–10.3)
Chloride: 98 mmol/L — ABNORMAL LOW (ref 101–111)
Creatinine, Ser: 1.13 mg/dL (ref 0.61–1.24)
GFR calc Af Amer: >60 mL/min (ref >60)
GFR calc non Af Amer: >60 mL/min (ref >60)
Glucose, Bld: 106 mg/dL — ABNORMAL HIGH (ref 65–99)
Potassium: 4.5 mmol/L (ref 3.5–5.1)
Sodium: 131 mmol/L — ABNORMAL LOW (ref 135–145)

## 2017-04-10 LAB — CBC
HCT: 31.9 % — ABNORMAL LOW (ref 39.0–52.0)
Hemoglobin: 10 g/dL — ABNORMAL LOW (ref 13.0–17.0)
MCH: 28.2 pg (ref 26.0–34.0)
MCHC: 31.3 g/dL (ref 30.0–36.0)
MCV: 89.9 fL (ref 78.0–100.0)
Platelets: 605 10*3/uL — ABNORMAL HIGH (ref 150–400)
RBC: 3.55 MIL/uL — ABNORMAL LOW (ref 4.22–5.81)
RDW: 15.3 % (ref 11.5–15.5)
WBC: 8.2 10*3/uL (ref 4.0–10.5)

## 2017-04-10 LAB — GLUCOSE, CAPILLARY
Glucose-Capillary: 107 mg/dL — ABNORMAL HIGH (ref 65–99)
Glucose-Capillary: 108 mg/dL — ABNORMAL HIGH (ref 65–99)

## 2017-04-10 MED ORDER — AMIODARONE HCL 200 MG PO TABS: 200.0000 mg | ORAL_TABLET | Freq: Every day | ORAL | 0 refills | Status: DC

## 2017-04-10 MED ORDER — OXYCODONE HCL 5 MG PO TABS: 5.0000 mg | ORAL_TABLET | Freq: Four times a day (QID) | ORAL | 0 refills | Status: DC | PRN

## 2017-04-10 MED ORDER — LOSARTAN POTASSIUM 25 MG PO TABS: 12.5000 mg | ORAL_TABLET | Freq: Every day | ORAL | 1 refills | Status: DC

## 2017-04-10 MED ORDER — SPIRONOLACTONE 25 MG PO TABS: 12.5000 mg | ORAL_TABLET | Freq: Every day | ORAL | 1 refills | Status: DC

## 2017-04-10 MED ORDER — DIGOXIN 125 MCG PO TABS: 0.1250 mg | ORAL_TABLET | Freq: Every day | ORAL | 1 refills | Status: DC

## 2017-04-10 MED ORDER — ASPIRIN 325 MG PO TBEC: 325.0000 mg | DELAYED_RELEASE_TABLET | Freq: Every day | ORAL | Status: DC

## 2017-04-10 MED ORDER — CARVEDILOL 3.125 MG PO TABS: 3.1250 mg | ORAL_TABLET | Freq: Two times a day (BID) | ORAL | 1 refills | Status: DC

## 2017-04-10 MED ORDER — GABAPENTIN 100 MG PO CAPS: 200.0000 mg | ORAL_CAPSULE | Freq: Three times a day (TID) | ORAL | 0 refills | Status: DC

## 2017-04-10 NOTE — Progress Notes (Signed)
Physical Therapy Treatment Patient Details Name: Luis [name] MRN: [id number] DOB: 22-May-1946 Today's Date: 04/10/2017    History of Present Illness Luis [name] is 2 year admitted after LHC complicated by Left main dissection, acute MI, cardiogenic shock requiring urgent CABG x2 with impella.  Extubated 7/3 with Impella removed as well.     PT Comments    Pt discharge has been changed to home and as such pt has requires stair training to ensure safety in his home environment. Pt is currently, minA for bed mobility, supervision for transfers and ambulation of 250 feet with RW and min guard for ascend/descend of 6 steps. Pt requires skilled PT to progress gait and stair training as well as to improve strength and endurance to safely mobilize in his home environment at discharge.    Follow Up Recommendations  Home health PT;Supervision/Assistance - 24 hour     Equipment Recommendations  Rolling walker with 5" wheels;3in1 (PT)    Recommendations for Other Services       Precautions / Restrictions Precautions Precautions: Fall;Sternal Precaution Comments: Pt requires min cues for precautions  Restrictions Weight Bearing Restrictions: Yes (sternal)    Mobility  Bed Mobility Overal bed mobility: Needs Assistance Bed Mobility: Supine to Sit;Rolling Rolling: Min guard   Supine to sit: Min assist     General bed mobility comments: minA for trunk to upright verbal cuing for bringing trunk to upright  Transfers Overall transfer level: Needs assistance Equipment used: Rolling walker (2 wheeled) Transfers: Sit to/from Stand Sit to Stand: Supervision         General transfer comment: cues for scooting hips out to EoB and hand placement on knees to maintain sternal precautions  Ambulation/Gait Ambulation/Gait assistance: Supervision Ambulation Distance (Feet): 250 Feet Assistive device: Rolling walker (2 wheeled) Gait Pattern/deviations: Step-through pattern;Trunk flexed Gait  velocity: decreased Gait velocity interpretation: Below normal speed for age/gender General Gait Details: vc for upright posture throughout gait,   Stairs Stairs: Yes   Stair Management: One rail Right;Forwards;Sideways;Step to pattern Number of Stairs: 6 (2x3 due to IV pole) General stair comments: pt with safe,steady ascent/descent of stairs with use of handrail for steadying      Balance Overall balance assessment: Needs assistance Sitting-balance support: No upper extremity supported;Feet supported Sitting balance-Leahy Scale: Good     Standing balance support: During functional activity Standing balance-Leahy Scale: Fair                              Cognition Arousal/Alertness: Awake/alert Behavior During Therapy: WFL for tasks assessed/performed Overall Cognitive Status: Within Functional Limits for tasks assessed                                           General Comments General comments (skin integrity, edema, etc.): supine at rest BP 97/52, HR 82 bpm, after ascent/desecnt of 3 steps HR 93bpm and SaO2 on RA 92% O2, with slight SoB, after ambulation BP 120/66, HR 92 bpm, SaO2 on RA 94%O2      Pertinent Vitals/Pain Pain Assessment: 0-10 Pain Score: 5  Pain Location: incisional Pain Descriptors / Indicators: Aching Pain Intervention(s): Monitored during session;Limited activity within patient's tolerance           PT Goals (current goals can now be found in the care plan section) Acute Rehab PT Goals Patient Stated  Goal: to get better  PT Goal Formulation: With patient Time For Goal Achievement: 04/15/17 Potential to Achieve Goals: Good Progress towards PT goals: Progressing toward goals    Frequency    Min 3X/week      PT Plan Discharge plan needs to be updated       AM-PAC PT "6 Clicks" Daily Activity  Outcome Measure  Difficulty turning over in bed (including adjusting bedclothes, sheets and blankets)?: A  Lot Difficulty moving from lying on back to sitting on the side of the bed? : Total Difficulty sitting down on and standing up from a chair with arms (e.g., wheelchair, bedside commode, etc,.)?: A Lot Help needed moving to and from a bed to chair (including a wheelchair)?: A Little Help needed walking in hospital room?: A Little Help needed climbing 3-5 steps with a railing? : A Little 6 Click Score: 14    End of Session Equipment Utilized During Treatment: Gait belt Activity Tolerance: Patient tolerated treatment well Patient left: in chair;with call bell/phone within reach;with family/visitor present Nurse Communication: Mobility status;Other (comment) (contact physician to ensure that there is an order of [place]) PT Visit Diagnosis: Unsteadiness on feet (R26.81);Muscle weakness (generalized) (M62.81);Pain Pain - part of body:  (incisional)     Time: 7412-8786 PT Time Calculation (min) (ACUTE ONLY): 31 min  Charges:  $Gait Training: 23-37 mins                    G Codes:       Cheris Tweten B. Migdalia Dk PT, DPT Acute Rehabilitation  [phone] Pager [phone]     [place] 04/10/2017, 10:23 AM

## 2017-04-10 NOTE — Care Management Important Message (Signed)
Important Message  Patient Details  Name: Luis [name] MRN: [id number] Date of Birth: [birth year]   Medicare Important Message Given:  Yes    [name] [name] 04/10/2017, 1:33 PM

## 2017-04-10 NOTE — Care Management Note (Addendum)
Case Management Note  Patient Details  Name: Luis [name] MRN: [id number] Date of Birth: [birth year]  Subjective/Objective:     CM following for progression and d/c planning.              Action/Plan: 04/10/2017 Met with pt and family, son has selected [facility] for [facility] services. Rolling walker ordered. [place] notified of DME and [place] needs. Pt requesting hospital bed, [facility] currently reviewing the chart re justification for hospital bed.  Pt does not meet criteria for a hospital bed, family informed that they may rent a bed if they wish. No further needs identified. Rolling walker delivered to pt room.   Expected Discharge Date:  04/10/17               Expected Discharge Plan:  [place]  In-House Referral:  Clinical Social Work  Discharge planning Services  CM Consult  Post Acute Care Choice:  Durable Medical Equipment, Home Health Choice offered to:  Adult Children  DME Arranged:  Walker rolling DME Agency:   [facility]  HH Arranged:  RN, PT, OT [place] Agency:  [facility]  Status of Service:  Completed, signed off  If discussed at [place] of Stay Meetings, dates discussed:    Additional Comments:  Adron Bene, RN 04/10/2017, 11:49 AM

## 2017-04-10 NOTE — Progress Notes (Signed)
CSW provided patient and patient's wife denial information for SNF. Patient's wife signed denial receipt. CSW will fax signed letter back to [facility].  Laveda Abbe, [place] Clinical Social Worker [phone]

## 2017-04-10 NOTE — Progress Notes (Signed)
Patient in a stable condition, discharge education reviwed with patient and family at bedside, they verbalized understanding, picc removed, tele dc patient belongings at bedside, family to transport patient home

## 2017-04-10 NOTE — Progress Notes (Signed)
[facility]  Patient Status: New  [facility] is following at the request of Dr. Haroldine Laws to provide home health services starting on Saturday. If patient discharges after hours, please call [phone].   Edwinna Areola 04/10/2017, 8:57 AM

## 2017-04-10 NOTE — Progress Notes (Signed)
      [address]       [place] [address]             [phone]      10 Days Post-Op Procedure(s) (LRB): REMOVAL OF IMPELLA LEFT VENTRICULAR ASSIST DEVICE (N/A) TRANSESOPHAGEAL ECHOCARDIOGRAM (TEE) (N/A) Subjective: Feels well  Objective: Vital signs in last 24 hours: Temp:  [98.1 F (36.7 C)-98.5 F (36.9 C)] 98.1 F (36.7 C) (07/13 0450) Pulse Rate:  [79-88] 79 (07/13 0618) Cardiac Rhythm: Normal sinus rhythm (07/13 0700) Resp:  [16-23] 16 (07/13 0618) BP: (87-104)/(54-66) 104/60 (07/13 0400) SpO2:  [95 %-97 %] 95 % (07/13 0618) Weight:  [189 lb 14.4 oz (86.1 kg)] 189 lb 14.4 oz (86.1 kg) (07/13 0618)  Hemodynamic parameters for last 24 hours:    Intake/Output from previous day: 07/12 0701 - 07/13 0700 In: 720 [P.O.:720] Out: 200 [Urine:200] Intake/Output this shift: No intake/output data recorded.  General appearance: alert, cooperative and no distress Heart: regular rate and rhythm Lungs: mildly dim in bases Abdomen: benign Extremities: no edema Wound: incis healing well  Lab Results:  Recent Labs  04/09/17 0324 04/10/17 0529  WBC 9.6 8.2  HGB 10.2* 10.0*  HCT 31.8* 31.9*  PLT 625* 605*   BMET:  Recent Labs  04/09/17 0324 04/10/17 0529  NA 134* 131*  K 4.4 4.5  CL 99* 98*  CO2 27 26  GLUCOSE 109* 106*  BUN 13 13  CREATININE 1.05 1.13  CALCIUM 8.5* 8.5*    PT/INR: No results for input(s): LABPROT, INR in the last 72 hours. ABG    Component Value Date/Time   PHART 7.535 (H) 04/02/2017 0429   HCO3 20.7 04/02/2017 0429   TCO2 27 04/05/2017 1758   ACIDBASEDEF 1.0 04/02/2017 0429   O2SAT 64.4 04/09/2017 0325   CBG (last 3)   Recent Labs  04/09/17 1652 04/09/17 2123 04/10/17 0614  GLUCAP 98 131* 107*    Meds Scheduled Meds: . amiodarone  200 mg Oral Daily  . aspirin EC  325 mg Oral Daily   Or  . aspirin  324 mg Per Tube Daily  . carvedilol  3.125 mg Oral BID WC  . digoxin  0.125 mg Oral Daily  .  enoxaparin (LOVENOX) injection  40 mg Subcutaneous Q24H  . gabapentin  200 mg Oral TID  . insulin aspart  0-15 Units Subcutaneous TID WC  . losartan  12.5 mg Oral QHS  . pantoprazole  40 mg Oral Q1200  . rosuvastatin  40 mg Oral q1800  . sodium chloride flush  10-40 mL Intracatheter Q12H  . sodium chloride flush  3 mL Intravenous Q12H  . spironolactone  12.5 mg Oral Daily   Continuous Infusions: . sodium chloride 10 mL/hr at 04/06/17 1945   PRN Meds:.levalbuterol, ondansetron (ZOFRAN) IV, oxyCODONE, phenol, sodium chloride flush, sodium chloride flush, traMADol  Xrays No results found.  Assessment/Plan: S/P Procedure(s) (LRB): REMOVAL OF IMPELLA LEFT VENTRICULAR ASSIST DEVICE (N/A) TRANSESOPHAGEAL ECHOCARDIOGRAM (TEE) (N/A) Plan for discharge: see discharge orders- with [facility] Labs stable, a little hyponatremic Sinus rhythm, BP low at times.  Sugars fairly well controlled A1C is 6.1, eats Vegan with little fat or protein- should find sources but religion limits which foods he can eat Meds as outlined per AHF team  LOS: 16 days    [name],Luis E 04/10/2017

## 2017-04-14 ENCOUNTER — Telehealth (HOSPITAL_COMMUNITY): Payer: Self-pay

## 2017-04-14 DIAGNOSIS — J939 Pneumothorax, unspecified: Secondary | ICD-10-CM | POA: Diagnosis not present

## 2017-04-14 DIAGNOSIS — Z48812 Encounter for surgical aftercare following surgery on the circulatory system: Secondary | ICD-10-CM | POA: Diagnosis not present

## 2017-04-14 NOTE — Telephone Encounter (Signed)
Patient insurance is active and benefits verified. Patient has [place] Medicare - no co-payment, no deductible, out of pocket $6700/$411.52 has been met, no co-insurance, no pre-authorization and no limit on visit. Passport/reference [phone].  Referral is currently on hold, patient was discharged from hospital with [place] services for PT and OT.   Patient is scheduled to follow up with Dr. [name] on 05/01/17, Dr. [name] on 05/06/17 and Dr. Haroldine Laws 05/11/17.

## 2017-04-16 ENCOUNTER — Encounter (HOSPITAL_COMMUNITY): Payer: Self-pay | Admitting: Emergency Medicine

## 2017-04-16 ENCOUNTER — Emergency Department (HOSPITAL_COMMUNITY): Payer: Medicare Other

## 2017-04-16 ENCOUNTER — Observation Stay (HOSPITAL_COMMUNITY): Payer: Medicare Other

## 2017-04-16 ENCOUNTER — Observation Stay (HOSPITAL_COMMUNITY)
Admission: EM | Admit: 2017-04-16 | Discharge: 2017-04-17 | Disposition: A | Discharge status: Home / Self Care | Payer: Medicare Other | Attending: Cardiology | Admitting: Cardiology

## 2017-04-16 DIAGNOSIS — R0602 Shortness of breath: Principal | ICD-10-CM | POA: Insufficient documentation

## 2017-04-16 DIAGNOSIS — E1122 Type 2 diabetes mellitus with diabetic chronic kidney disease: Secondary | ICD-10-CM | POA: Diagnosis not present

## 2017-04-16 DIAGNOSIS — Z79899 Other long term (current) drug therapy: Secondary | ICD-10-CM | POA: Insufficient documentation

## 2017-04-16 DIAGNOSIS — R0600 Dyspnea, unspecified: Secondary | ICD-10-CM | POA: Diagnosis present

## 2017-04-16 DIAGNOSIS — I5041 Acute combined systolic (congestive) and diastolic (congestive) heart failure: Secondary | ICD-10-CM

## 2017-04-16 DIAGNOSIS — Z7982 Long term (current) use of aspirin: Secondary | ICD-10-CM | POA: Insufficient documentation

## 2017-04-16 DIAGNOSIS — N182 Chronic kidney disease, stage 2 (mild): Secondary | ICD-10-CM | POA: Diagnosis not present

## 2017-04-16 HISTORY — DX: Acute combined systolic (congestive) and diastolic (congestive) heart failure: I50.41

## 2017-04-16 LAB — BASIC METABOLIC PANEL
Anion gap: 8 (ref 5–15)
Anion gap: 7 (ref 5–15)
BUN: 10 mg/dL (ref 6–20)
BUN: 10 mg/dL (ref 6–20)
CO2: 23 mmol/L (ref 22–32)
CO2: 26 mmol/L (ref 22–32)
Calcium: 8.4 mg/dL — ABNORMAL LOW (ref 8.9–10.3)
Calcium: 8.4 mg/dL — ABNORMAL LOW (ref 8.9–10.3)
Chloride: 100 mmol/L — ABNORMAL LOW (ref 101–111)
Chloride: 100 mmol/L — ABNORMAL LOW (ref 101–111)
Creatinine, Ser: 1.05 mg/dL (ref 0.61–1.24)
Creatinine, Ser: 1.18 mg/dL (ref 0.61–1.24)
GFR calc Af Amer: >60 mL/min (ref >60)
GFR calc Af Amer: >60 mL/min (ref >60)
GFR calc non Af Amer: >60 mL/min (ref >60)
GFR calc non Af Amer: >60 mL/min (ref >60)
Glucose, Bld: 116 mg/dL — ABNORMAL HIGH (ref 65–99)
Glucose, Bld: 102 mg/dL — ABNORMAL HIGH (ref 65–99)
Potassium: 5.4 mmol/L — ABNORMAL HIGH (ref 3.5–5.1)
Potassium: 4.8 mmol/L (ref 3.5–5.1)
Sodium: 131 mmol/L — ABNORMAL LOW (ref 135–145)
Sodium: 133 mmol/L — ABNORMAL LOW (ref 135–145)

## 2017-04-16 LAB — BRAIN NATRIURETIC PEPTIDE
B Natriuretic Peptide: 1028.2 pg/mL — ABNORMAL HIGH (ref 0.0–100.0)
B Natriuretic Peptide: 1052.5 pg/mL — ABNORMAL HIGH (ref 0.0–100.0)

## 2017-04-16 LAB — CBC
HCT: 30.8 % — ABNORMAL LOW (ref 39.0–52.0)
Hemoglobin: 9.9 g/dL — ABNORMAL LOW (ref 13.0–17.0)
MCH: 28.4 pg (ref 26.0–34.0)
MCHC: 32.1 g/dL (ref 30.0–36.0)
MCV: 88.3 fL (ref 78.0–100.0)
Platelets: 414 10*3/uL — ABNORMAL HIGH (ref 150–400)
RBC: 3.49 MIL/uL — ABNORMAL LOW (ref 4.22–5.81)
RDW: 14.8 % (ref 11.5–15.5)
WBC: 7.7 10*3/uL (ref 4.0–10.5)

## 2017-04-16 LAB — I-STAT TROPONIN, ED: Troponin i, poc: 0.02 ng/mL (ref 0.00–0.08)

## 2017-04-16 LAB — TROPONIN I: Troponin I: 0.03 ng/mL (ref <0.03)

## 2017-04-16 MED ORDER — FUROSEMIDE 20 MG PO TABS
20.0000 mg | ORAL_TABLET | Freq: Every day | ORAL | Status: DC
  Administered 2017-04-17: 20 mg via ORAL
  Filled 2017-04-16: qty 1

## 2017-04-16 MED ORDER — ROSUVASTATIN CALCIUM 10 MG PO TABS
40.0000 mg | ORAL_TABLET | Freq: Every day | ORAL | Status: DC
  Administered 2017-04-16 – 2017-04-17 (×2): 40 mg via ORAL
  Filled 2017-04-16 (×3): qty 4

## 2017-04-16 MED ORDER — SODIUM CHLORIDE 0.9 % IV SOLN: 250.0000 mL | INTRAVENOUS | Status: DC | PRN

## 2017-04-16 MED ORDER — CARVEDILOL 3.125 MG PO TABS: 3.1250 mg | ORAL_TABLET | Freq: Two times a day (BID) | ORAL | Status: DC

## 2017-04-16 MED ORDER — CLOTRIMAZOLE 1 % EX CREA: TOPICAL_CREAM | Freq: Two times a day (BID) | CUTANEOUS | Status: DC | PRN

## 2017-04-16 MED ORDER — FUROSEMIDE 10 MG/ML IJ SOLN
20.0000 mg | Freq: Once | INTRAMUSCULAR | Status: AC
  Administered 2017-04-16: 20 mg via INTRAVENOUS
  Filled 2017-04-16: qty 2

## 2017-04-16 MED ORDER — ONDANSETRON HCL 4 MG/2ML IJ SOLN: 4.0000 mg | Freq: Four times a day (QID) | INTRAMUSCULAR | Status: DC | PRN

## 2017-04-16 MED ORDER — OXYCODONE HCL 5 MG PO TABS
5.0000 mg | ORAL_TABLET | Freq: Four times a day (QID) | ORAL | Status: DC | PRN
  Administered 2017-04-16: 5 mg via ORAL
  Filled 2017-04-16: qty 1

## 2017-04-16 MED ORDER — SODIUM CHLORIDE 0.9% FLUSH: 3.0000 mL | Freq: Two times a day (BID) | INTRAVENOUS | Status: DC

## 2017-04-16 MED ORDER — CLOPIDOGREL BISULFATE 75 MG PO TABS
75.0000 mg | ORAL_TABLET | Freq: Every day | ORAL | Status: DC
  Administered 2017-04-16 – 2017-04-17 (×2): 75 mg via ORAL
  Filled 2017-04-16 (×3): qty 1

## 2017-04-16 MED ORDER — SPIRONOLACTONE 12.5 MG HALF TABLET: 12.5000 mg | ORAL_TABLET | Freq: Every day | ORAL | Status: DC

## 2017-04-16 MED ORDER — HEPARIN SODIUM (PORCINE) 5000 UNIT/ML IJ SOLN
5000.0000 [IU] | Freq: Three times a day (TID) | INTRAMUSCULAR | Status: DC
  Administered 2017-04-16 – 2017-04-17 (×3): 5000 [IU] via SUBCUTANEOUS
  Filled 2017-04-16 (×3): qty 1

## 2017-04-16 MED ORDER — AMIODARONE HCL 100 MG PO TABS
100.0000 mg | ORAL_TABLET | Freq: Every day | ORAL | Status: DC
  Administered 2017-04-17: 100 mg via ORAL
  Filled 2017-04-16: qty 1

## 2017-04-16 MED ORDER — GABAPENTIN 100 MG PO CAPS
200.0000 mg | ORAL_CAPSULE | Freq: Three times a day (TID) | ORAL | Status: DC
  Administered 2017-04-16 – 2017-04-17 (×4): 200 mg via ORAL
  Filled 2017-04-16 (×5): qty 2

## 2017-04-16 MED ORDER — CARVEDILOL 3.125 MG PO TABS
3.1250 mg | ORAL_TABLET | Freq: Two times a day (BID) | ORAL | Status: DC
  Administered 2017-04-16 – 2017-04-17 (×3): 3.125 mg via ORAL
  Filled 2017-04-16 (×3): qty 1

## 2017-04-16 MED ORDER — ENSURE ENLIVE PO LIQD
237.0000 mL | Freq: Two times a day (BID) | ORAL | Status: DC
  Administered 2017-04-17: 237 mL via ORAL

## 2017-04-16 MED ORDER — LOSARTAN POTASSIUM 25 MG PO TABS: 12.5000 mg | ORAL_TABLET | Freq: Every day | ORAL | Status: DC

## 2017-04-16 MED ORDER — AMIODARONE HCL 200 MG PO TABS
200.0000 mg | ORAL_TABLET | Freq: Every day | ORAL | Status: DC
Start: 2017-04-16 — End: 2017-04-16

## 2017-04-16 MED ORDER — SODIUM CHLORIDE 0.9% FLUSH: 3.0000 mL | INTRAVENOUS | Status: DC | PRN

## 2017-04-16 MED ORDER — DIGOXIN 125 MCG PO TABS
0.1250 mg | ORAL_TABLET | Freq: Every day | ORAL | Status: DC
  Administered 2017-04-16 – 2017-04-17 (×2): 0.125 mg via ORAL
  Filled 2017-04-16 (×2): qty 1

## 2017-04-16 MED ORDER — ASPIRIN EC 325 MG PO TBEC
325.0000 mg | DELAYED_RELEASE_TABLET | Freq: Every day | ORAL | Status: DC
  Administered 2017-04-16 – 2017-04-17 (×2): 325 mg via ORAL
  Filled 2017-04-16 (×2): qty 1

## 2017-04-16 MED ORDER — ACETAMINOPHEN 325 MG PO TABS: 650.0000 mg | ORAL_TABLET | ORAL | Status: DC | PRN

## 2017-04-16 MED ORDER — FUROSEMIDE 10 MG/ML IJ SOLN
40.0000 mg | Freq: Once | INTRAMUSCULAR | Status: AC
  Administered 2017-04-16: 40 mg via INTRAVENOUS
  Filled 2017-04-16: qty 4

## 2017-04-16 NOTE — ED Provider Notes (Signed)
By signing my name below, I, Ephriam Jenkins, attest that this documentation has been prepared under the direction and in the presence of Ward, Delice Bison, DO. Electronically signed, Ephriam Jenkins, ED Scribe. 04/16/17. 12:41 AM.  TIME SEEN: 12:40 AM  CHIEF COMPLAINT: Shortness of breath  HPI:   HPI Comments: Luis [name] is a 71 y.o. male with hx of CAD, recent CABG x 2V, brought in by ambulance, who presents to the Emergency Department complaining of gradual onset dyspnea on exertion that started around 2200 . Per triage note, pt's O2 sat was 99% on RA. She was placed on 2L O2 for comfort which he stated improved his shortness of breath. Pt had a CABG on 03/25/17 and was released from the hospital 04/10/17; six days ago. Before his admission pt had a routine EKG which was abnormal. He then had a cardiac stress test which was also abnormal. This led him to have a cardiac Cath on 03/25/17 which showed circumflex with 75% stenosis and completely occluded LAD. During the cath they dissected his left main artery. He then had to have a 2 vessel CABG by Dr. [name] and had LVAD placed for cardiogenic shock. The LVAD was removed on 03/31/17. His last Echocardiogram on July 3rd showed an ejection fraction of 30%. Pt is not currently experiencing any any acute chest pain or discomfort. He did not have similar shortness of breath during his stress test. He does not wear supplemental O2 at home. He has been eating and drinking per normal. Pt notes he has had a mild non-productive cough recently but denies fever. No lower extremity swelling or calf pain. No Hx of asthma, COPD.  ROS: See HPI Constitutional: no fever  Eyes: no drainage  ENT: no runny nose   Cardiovascular:  no chest pain  Resp: + SOB  GI: no vomiting GU: no dysuria Integumentary: no rash  Allergy: no hives  Musculoskeletal: no leg swelling  Neurological: no slurred speech ROS otherwise negative  PAST MEDICAL HISTORY/PAST SURGICAL HISTORY:  Past  Medical History:  Diagnosis Date  . Chronic kidney disease    kidney stones; noted per H&P per Dr [name] 02/06/2015 chronic kidney disease stage II  . Diabetes mellitus without complication ([place])    per H&P Dr [name] noted to be prediabetic 02/06/2015  . Dyslipidemia   . History of hepatomegaly    with fatty liver discussed per H&P per Dr [name] 02/06/2015 secondary to abd ultrasound   . Sleep apnea    AHI 17 on sleep study 08/23/2013 pt states was given CPAP machine but sent it back - does not use   . Vitamin D deficiency    as noted per H&P per Dr [name] 02/06/2015    MEDICATIONS:  Prior to Admission medications   Medication Sig Start Date End Date Taking? Authorizing Provider  amiodarone (PACERONE) 200 MG tablet Take 1 tablet (200 mg total) by mouth daily. 04/10/17   Gold, Wilder Glade, PA-C  aspirin EC 325 MG EC tablet Take 1 tablet (325 mg total) by mouth daily. 04/10/17   Gold, Wilder Glade, PA-C  carvedilol (COREG) 3.125 MG tablet Take 1 tablet (3.125 mg total) by mouth 2 (two) times daily with a meal. 04/10/17   Gold, Wayne E, PA-C  clotrimazole-betamethasone (LOTRISONE) cream APPLY TO AFFECED AREA LOCALLY TWICE DAILY AS NEEDED FOR RASH 02/26/17   [provider]  digoxin (LANOXIN) 0.125 MG tablet Take 1 tablet (0.125 mg total) by mouth daily. 04/10/17   Jadene Pierini  E, PA-C  gabapentin (NEURONTIN) 100 MG capsule Take 2 capsules (200 mg total) by mouth 3 (three) times daily. 04/10/17   Gold, Wayne E, PA-C  losartan (COZAAR) 25 MG tablet Take 0.5 tablets (12.5 mg total) by mouth at bedtime. 04/10/17   Gold, Patrick Jupiter E, PA-C  oxyCODONE (OXY IR/ROXICODONE) 5 MG immediate release tablet Take 1 tablet (5 mg total) by mouth every 6 (six) hours as needed for severe pain. 04/10/17   Gold, Wayne E, PA-C  rosuvastatin (CRESTOR) 40 MG tablet Take 40 mg by mouth daily. 03/17/17   [provider]  spironolactone (ALDACTONE) 25 MG tablet Take 0.5 tablets (12.5 mg total) by mouth daily. 04/10/17    John Giovanni, PA-C    ALLERGIES:  Allergies  Allergen Reactions  . No Known Allergies     SOCIAL HISTORY:  Social History  Substance Use Topics  . Smoking status: Never Smoker  . Smokeless tobacco: Never Used  . Alcohol use No   FAMILY HISTORY: No family history on file.  EXAM: BP 101/72   Pulse 76   Temp 98.2 F (36.8 C) (Oral)   Resp 18   SpO2 97%  CONSTITUTIONAL: Alert and oriented and responds appropriately to questions. Elderly, chronically ill appearing; well-nourished HEAD: Normocephalic EYES: Conjunctivae clear, pupils appear equal, EOMI ENT: normal nose; moist mucous membranes NECK: Supple, no meningismus, no nuchal rigidity, no LAD  CARD: RRR; S1 and S2 appreciated; no murmurs, no clicks, no rubs, no gallops RESP: Normal chest excursion without splinting or tachypnea;  Bibasilar crackles. Breath sounds equal bilaterally; no wheezes, no rhonchi, no rales, no hypoxia or respiratory distress, speaking full sentences ABD/GI: Normal bowel sounds; non-distended; soft, non-tender, no rebound, no guarding, no peritoneal signs, no hepatosplenomegaly BACK:  The back appears normal and is non-tender to palpation, there is no CVA tenderness EXT: Normal ROM in all joints; non-tender to palpation; no edema; normal capillary refill; no cyanosis, no calf tenderness or swelling    SKIN: Normal color for age and race; warm; no rash NEURO: Moves all extremities equally PSYCH: The patient's mood and manner are appropriate. Grooming and personal hygiene are appropriate.  MEDICAL DECISION MAKING: Patient here with acute onset shortness of breath. He does seem to have bibasilar crackles on exam but no other sign of volume overload. No chest pain or chest discomfort. Will obtain cardiac labs, chest x-ray. He is currently hemodynamically stable.  ED PROGRESS:  Patient's troponin is 0.02. It is not crossing over into the system. His BNP is elevated at 1028. No old for comparison. Chest  x-ray shows small left pleural effusion but no edema, infiltrate. Mild hyperkalemia without other EKG changes. No ischemic abnormality on EKG. We'll discuss with his cardiologist for recommendations.   3:05 AM  D/w Dr. [name].  He agrees with giving patient small dose of IV Lasix. We'll give 20 mg in the ED. He will admit patient to his service and has requested I place holding orders for telemetry, observation. Will order morning labs and patient's home medications. Patient and family have been updated with this plan. He is still hemodynamically stable without current complaints.  I reviewed all nursing notes, vitals, pertinent previous records, EKGs, lab and urine results, imaging (as available).     EKG Interpretation  Date/Time:  Thursday April 16 2017 00:24:47 EDT Ventricular Rate:  77 PR Interval:    QRS Duration: 80 QT Interval:  521 QTC Calculation: 590 R Axis:   51 Text Interpretation:  Sinus rhythm  Borderline T abnormalities, anterior leads Prolonged QT interval No significant change since last tracing Confirmed by Ward, Cyril Mourning [phone]) on 04/16/2017 12:40:32 AM        This chart was scribed in my presence and reviewed by me personally.    Ward, Delice Bison, DO 04/16/17 [phone]

## 2017-04-16 NOTE — ED Notes (Signed)
Attempted to call report x 1  

## 2017-04-16 NOTE — H&P (Signed)
Luis [name] is an 71 y.o. male.   Chief Complaint: Dyspnea HPI: Luis [name]  is a 71 y.o. male . Recently discharged on 04/10/2017 after emergent CABG on 03/25/2017, had been doing well until yesterday around 10:30*noticing acute onset of shortness of breath. EMS was called and patient was brought to the emergency room, BNP was markedly elevated, received intravenous Lasix with immediate relief. This morning she feels well and denies any chest pain. Otherwise he's been doing well and has been getting along well. States that for the first time yesterday he drank a lot of fluids as per the instructions from home health. After receiving Lasix he slept well without any PND or orthopnea. He has not had any chest pain or palpitation. No leg edema.  Past Medical History:  Diagnosis Date  . Chronic kidney disease    kidney stones; noted per H&P per Dr [name] 02/06/2015 chronic kidney disease stage II  . Diabetes mellitus without complication ([place])    per H&P Dr [name] noted to be prediabetic 02/06/2015  . Dyslipidemia   . History of hepatomegaly    with fatty liver discussed per H&P per Dr [name] 02/06/2015 secondary to abd ultrasound   . Sleep apnea    AHI 17 on sleep study 08/23/2013 pt states was given CPAP machine but sent it back - does not use   . Vitamin D deficiency    as noted per H&P per Dr [name] 02/06/2015    Past Surgical History:  Procedure Laterality Date  . ASD REPAIR N/A 03/25/2017   Procedure: ATRIAL SEPTAL DEFECT (ASD) REPAIR;  Surgeon: [name], MD;  Location: [facility];  Service: Open Heart Surgery;  Laterality: N/A;  . CORONARY ARTERY BYPASS GRAFT N/A 03/25/2017   Procedure: CORONARY ARTERY BYPASS GRAFTING (CABG) x 2 using left internal mammary artery and right greater saphenous leg vein using endosciope.;  Surgeon: [name], MD;  Location: [facility];  Service: Open Heart Surgery;  Laterality: N/A;  . CYSTOSCOPY W/ RETROGRADES Left 03/26/2015   Procedure: CYSTOSCOPY  WITH RETROGRADE PYELOGRAM;  Surgeon: [name], MD;  Location: [facility];  Service: Urology;  Laterality: Left;  . LEFT HEART CATH AND CORONARY ANGIOGRAPHY N/A 03/25/2017   Procedure: Left Heart Cath and Coronary Angiography;  Surgeon: [name], MD;  Location: [facility];  Service: Cardiovascular;  Laterality: N/A;  . NO PAST SURGERIES    . PLACEMENT OF IMPELLA LEFT VENTRICULAR ASSIST DEVICE  03/25/2017   Procedure: PLACEMENT OF IMPELLA LEFT VENTRICULAR ASSIST DEVICE;  Surgeon: [name], MD;  Location: [facility];  Service: Open Heart Surgery;;  . REMOVAL OF IMPELLA LEFT VENTRICULAR ASSIST DEVICE N/A 03/31/2017   Procedure: REMOVAL OF IMPELLA LEFT VENTRICULAR ASSIST DEVICE;  Surgeon: [name], MD;  Location: [facility];  Service: Open Heart Surgery;  Laterality: N/A;  . ROBOT ASSISTED PYELOPLASTY Left 03/26/2015   Procedure: ROBOTIC ASSISTED PYELOPLASTY WITH STENT PLACEMENT, REMOVAL OF STONE AND CYST DECORTICATION;  Surgeon: [name], MD;  Location: [facility];  Service: Urology;  Laterality: Left;  . TEE WITHOUT CARDIOVERSION N/A 03/25/2017   Procedure: TRANSESOPHAGEAL ECHOCARDIOGRAM (TEE);  Surgeon: [name], [name], MD;  Location: [facility];  Service: Open Heart Surgery;  Laterality: N/A;  . TEE WITHOUT CARDIOVERSION N/A 03/31/2017   Procedure: TRANSESOPHAGEAL ECHOCARDIOGRAM (TEE);  Surgeon: [name], [name], MD;  Location: [facility];  Service: Open Heart Surgery;  Laterality: N/A;    No family history on file. Social History:  reports that  he has never smoked. He has never used smokeless tobacco. He reports that he does not drink alcohol or use drugs.  Allergies:  Allergies  Allergen Reactions  . No Known Allergies     Review of Systems - Generalized weakness present, no dark stools but has noticed mild constipation, shortness of breath present, no chest pain, no weight changes. Not a diabetic. No sleep disturbance. No symptoms to suggest TIA or claudication. Has been progressing  well post CABG. Other systems negative.  Blood pressure (!) 94/58, pulse 81, temperature 98.2 F (36.8 C), temperature source Oral, resp. rate 16, SpO2 95 %. There is no height or weight on file to calculate BMI.  General appearance: alert, cooperative, appears stated age and no distress Eyes: negative findings: lids and lashes normal Neck: no adenopathy, no carotid bruit, supple, symmetrical, trachea midline and thyroid not enlarged, symmetric, no tenderness/mass/nodules Neck: JVP - increased to mid neck, carotids 2+= without bruits Resp: Bilateral basal crackles left worse than the right. Occupies less than one third. Chest wall: Sternotomy scar noted. Healed well. Cardio: regular rate and rhythm, S1, S2 normal, no murmur, click, rub or gallop GI: soft, non-tender; bowel sounds normal; no masses,  no organomegaly Extremities: extremities normal, atraumatic, no cyanosis or edema Pulses: 2+ and symmetric Skin: Skin color, texture, turgor normal. No rashes or lesions Neurologic: Grossly normal  Results for orders placed or performed during the hospital encounter of 04/16/17 (from the past 48 hour(s))  Basic metabolic panel     Status: Abnormal   Collection Time: 04/16/17 12:30 AM  Result Value Ref Range   Sodium 131 (L) 135 - 145 mmol/L   Potassium 5.4 (H) 3.5 - 5.1 mmol/L   Chloride 100 (L) 101 - 111 mmol/L   CO2 23 22 - 32 mmol/L   Glucose, Bld 116 (H) 65 - 99 mg/dL   BUN 10 6 - 20 mg/dL   Creatinine, Ser 1.05 0.61 - 1.24 mg/dL   Calcium 8.4 (L) 8.9 - 10.3 mg/dL   GFR calc non Af Amer >60 >60 mL/min   GFR calc Af Amer >60 >60 mL/min    Comment: (NOTE) The eGFR has been calculated using the CKD EPI equation. This calculation has not been validated in all clinical situations. eGFR's persistently <60 mL/min signify possible Chronic Kidney Disease.    Anion gap 8 5 - 15  CBC     Status: Abnormal   Collection Time: 04/16/17 12:30 AM  Result Value Ref Range   WBC 7.7 4.0 - 10.5  K/uL   RBC 3.49 (L) 4.22 - 5.81 MIL/uL   Hemoglobin 9.9 (L) 13.0 - 17.0 g/dL   HCT 30.8 (L) 39.0 - 52.0 %   MCV 88.3 78.0 - 100.0 fL   MCH 28.4 26.0 - 34.0 pg   MCHC 32.1 30.0 - 36.0 g/dL   RDW 14.8 11.5 - 15.5 %   Platelets 414 (H) 150 - 400 K/uL  Brain natriuretic peptide     Status: Abnormal   Collection Time: 04/16/17 12:36 AM  Result Value Ref Range   B Natriuretic Peptide 1,028.2 (H) 0.0 - 100.0 pg/mL  I-stat troponin, ED     Status: None   Collection Time: 04/16/17  1:29 AM  Result Value Ref Range   Troponin i, poc 0.02 0.00 - 0.08 ng/mL   Comment 3            Comment: Due to the release kinetics of cTnI, a negative result within the first hours  of the onset of symptoms does not rule out myocardial infarction with certainty. If myocardial infarction is still suspected, repeat the test at appropriate intervals.   Brain natriuretic peptide     Status: Abnormal   Collection Time: 04/16/17  6:34 AM  Result Value Ref Range   B Natriuretic Peptide 1,052.5 (H) 0.0 - 100.0 pg/mL  Troponin I     Status: Abnormal   Collection Time: 04/16/17  6:34 AM  Result Value Ref Range   Troponin I 0.03 (HH) <0.03 ng/mL    Comment: CRITICAL RESULT CALLED TO, READ BACK BY AND VERIFIED WITH: A.JASPER,RN 0730 04/16/17 CLARK,S   Basic metabolic panel     Status: Abnormal   Collection Time: 04/16/17  6:34 AM  Result Value Ref Range   Sodium 133 (L) 135 - 145 mmol/L   Potassium 4.8 3.5 - 5.1 mmol/L   Chloride 100 (L) 101 - 111 mmol/L   CO2 26 22 - 32 mmol/L   Glucose, Bld 102 (H) 65 - 99 mg/dL   BUN 10 6 - 20 mg/dL   Creatinine, Ser 1.18 0.61 - 1.24 mg/dL   Calcium 8.4 (L) 8.9 - 10.3 mg/dL   GFR calc non Af Amer >60 >60 mL/min   GFR calc Af Amer >60 >60 mL/min    Comment: (NOTE) The eGFR has been calculated using the CKD EPI equation. This calculation has not been validated in all clinical situations. eGFR's persistently <60 mL/min signify possible Chronic Kidney Disease.    Anion gap  7 5 - 15    Labs:   Lab Results  Component Value Date   WBC 7.7 04/16/2017   HGB 9.9 (L) 04/16/2017   HCT 30.8 (L) 04/16/2017   MCV 88.3 04/16/2017   PLT 414 (H) 04/16/2017    Recent Labs Lab 04/16/17 0634  NA 133*  K 4.8  CL 100*  CO2 26  BUN 10  CREATININE 1.18  CALCIUM 8.4*  GLUCOSE 102*    Lipid Panel  No results found for: CHOL, TRIG, HDL, CHOLHDL, VLDL, LDLCALC  BNP (last 3 results)  Recent Labs  04/16/17 0036 04/16/17 0634  BNP 1,028.2* 1,052.5*    HEMOGLOBIN A1C Lab Results  Component Value Date   HGBA1C 6.1 (H) 03/26/2017   MPG 128 03/26/2017    Cardiac Panel (last 3 results)  Recent Labs  04/16/17 0634  TROPONINI 0.03*    Lab Results  Component Value Date   TROPONINI 0.03 (HH) 04/16/2017     TSH No results for input(s): TSH in the last 8760 hours.    (Not in a hospital admission)    Current Facility-Administered Medications:  .  0.9 %  sodium chloride infusion, 250 mL, Intravenous, PRN, [name], MD .  acetaminophen (TYLENOL) tablet 650 mg, 650 mg, Oral, Q4H PRN, [name], MD .  amiodarone (PACERONE) tablet 200 mg, 200 mg, Oral, Daily, Ward, Kristen N, DO .  aspirin EC tablet 325 mg, 325 mg, Oral, Daily, Ward, Kristen N, DO .  carvedilol (COREG) tablet 3.125 mg, 3.125 mg, Oral, BID WC, Ward, Kristen N, DO .  carvedilol (COREG) tablet 3.125 mg, 3.125 mg, Oral, BID WC, [name], MD .  clopidogrel (PLAVIX) tablet 75 mg, 75 mg, Oral, Daily, [name], MD .  clotrimazole (LOTRIMIN) 1 % cream, , Topical, Q12H PRN, Ward, Kristen N, DO .  digoxin (LANOXIN) tablet 0.125 mg, 0.125 mg, Oral, Daily, Ward, Kristen N, DO .  furosemide (LASIX) injection 40 mg, 40 mg, Intravenous, Once,  [name], MD .  Derrill Memo ON 04/17/2017] furosemide (LASIX) tablet 20 mg, 20 mg, Oral, Daily, [name], MD .  gabapentin (NEURONTIN) capsule 200 mg, 200 mg, Oral, TID, Ward, Kristen N, DO .  heparin injection 5,000 Units, 5,000 Units, Subcutaneous, Q8H,  [name], MD .  ondansetron [facility]) injection 4 mg, 4 mg, Intravenous, Q6H PRN, [name], MD .  oxyCODONE (Oxy IR/ROXICODONE) immediate release tablet 5 mg, 5 mg, Oral, Q6H PRN, Ward, Kristen N, DO .  rosuvastatin (CRESTOR) tablet 40 mg, 40 mg, Oral, Daily, Ward, Kristen N, DO .  sodium chloride flush (NS) 0.9 % injection 3 mL, 3 mL, Intravenous, Q12H, [name], MD .  sodium chloride flush (NS) 0.9 % injection 3 mL, 3 mL, Intravenous, PRN, [name], MD  Current Outpatient Prescriptions:  .  amiodarone (PACERONE) 200 MG tablet, Take 1 tablet (200 mg total) by mouth daily., Disp: 30 tablet, Rfl: 0 .  aspirin EC 325 MG EC tablet, Take 1 tablet (325 mg total) by mouth daily., Disp: , Rfl:  .  carvedilol (COREG) 3.125 MG tablet, Take 1 tablet (3.125 mg total) by mouth 2 (two) times daily with a meal., Disp: 60 tablet, Rfl: 1 .  clotrimazole-betamethasone (LOTRISONE) cream, APPLY TO AFFECED AREA LOCALLY TWICE DAILY AS NEEDED FOR RASH, Disp: , Rfl: 1 .  digoxin (LANOXIN) 0.125 MG tablet, Take 1 tablet (0.125 mg total) by mouth daily., Disp: 30 tablet, Rfl: 1 .  gabapentin (NEURONTIN) 100 MG capsule, Take 2 capsules (200 mg total) by mouth 3 (three) times daily., Disp: 90 capsule, Rfl: 0 .  losartan (COZAAR) 25 MG tablet, Take 0.5 tablets (12.5 mg total) by mouth at bedtime., Disp: 15 tablet, Rfl: 1 .  oxyCODONE (OXY IR/ROXICODONE) 5 MG immediate release tablet, Take 1 tablet (5 mg total) by mouth every 6 (six) hours as needed for severe pain., Disp: 30 tablet, Rfl: 0 .  rosuvastatin (CRESTOR) 40 MG tablet, Take 40 mg by mouth daily., Disp: , Rfl: 4 .  spironolactone (ALDACTONE) 25 MG tablet, Take 0.5 tablets (12.5 mg total) by mouth daily., Disp: 15 tablet, Rfl: 1  CARDIAC STUDIES:  EKG 04/16/2017: Normal sinus rhythm at a rate of 77 bpm, nonspecific T-wave flattening, prolonged QT.  Intraoperative TEE 03/25/2017:  Left ventricle: Cavity is moderately dilated. LV systolic function is  moderately to severely reduced with an EF of 30-35%. Wall motion is abnormal. Mid, anterolateral wall motion is hypokinetic. Apical wall motion is akinetic. No thrombus present. No mass present.  Septum: Small interatrial septal defect present with left to right shunting visualized by color doppler. Lipomatous atrial septal hypertrophy present.  Mitral valve: Moderate leaflet thickening is present. Moderate leaflet calcification is present. Mild mitral annular calcification. Mild regurgitation  Assessment/Plan 1. Acute on chronic systolic and diastolic heart failure 2. CAD S/P emergent CABG  with LIMA to LAD and SVG to OM 3. Hypertension, presently hypotensive after diuresis 4. Abnormal EKG with prolonged QT. 5. Hyperglycemia  Recommendation: Admit the patient for observation, there is gently, hold Spiriva lactone and losartan for now, continue low-dose carvedilol. Suspect his acute decompensation was related to excessive fluid intake yesterday. I will also start the patient on clopidogrel due to recent ACS/non-STEMI.  [name], MD 04/16/2017, 9:08 AM [facility]. [place] Pager: [phone] Office: [phone] If no answer: Cell:  [phone]

## 2017-04-16 NOTE — ED Triage Notes (Addendum)
Per [facility]: Pt to ED from home c/o SOB upon exertion x 6 days as well as productive cough. Upon EMS arrival, pt 99% RA, placed on 2L O2 for comfort, which patient states improved his SOB. Pt states at rest, the SOB is not there. Pt has CABG 6/27 and was released from hospital 7/13. Pt denies fevers/chills, new CP (just soreness from coughing and from surgery), no N/V/D. EMS VS: 108/70, P 78 regular, 100% 2L O2. Patient A&O x 4. No redness or discharge noted from surgical incision. Expiratory wheezes noted in R lower lobe and diminished breath sounds in L lower lobe. Skin warm/dry, resp currently e/u.

## 2017-04-17 ENCOUNTER — Observation Stay (HOSPITAL_BASED_OUTPATIENT_CLINIC_OR_DEPARTMENT_OTHER): Payer: Medicare Other

## 2017-04-17 DIAGNOSIS — I361 Nonrheumatic tricuspid (valve) insufficiency: Secondary | ICD-10-CM | POA: Diagnosis not present

## 2017-04-17 LAB — BASIC METABOLIC PANEL
Anion gap: 9 (ref 5–15)
BUN: 12 mg/dL (ref 6–20)
CO2: 24 mmol/L (ref 22–32)
Calcium: 8.5 mg/dL — ABNORMAL LOW (ref 8.9–10.3)
Chloride: 101 mmol/L (ref 101–111)
Creatinine, Ser: 1.19 mg/dL (ref 0.61–1.24)
GFR calc Af Amer: >60 mL/min (ref >60)
GFR calc non Af Amer: >60 mL/min (ref >60)
Glucose, Bld: 93 mg/dL (ref 65–99)
Potassium: 4.3 mmol/L (ref 3.5–5.1)
Sodium: 134 mmol/L — ABNORMAL LOW (ref 135–145)

## 2017-04-17 LAB — BRAIN NATRIURETIC PEPTIDE: B Natriuretic Peptide: 494.8 pg/mL — ABNORMAL HIGH (ref 0.0–100.0)

## 2017-04-17 LAB — ECHOCARDIOGRAM COMPLETE
Height: 67.5 in
Weight: 2681.6 oz

## 2017-04-17 MED ORDER — FUROSEMIDE 20 MG PO TABS: 40.0000 mg | ORAL_TABLET | Freq: Every day | ORAL | 0 refills | Status: DC | PRN

## 2017-04-17 MED ORDER — AMIODARONE HCL 200 MG PO TABS: 100.0000 mg | ORAL_TABLET | Freq: Every day | ORAL | 0 refills | Status: DC

## 2017-04-17 MED ORDER — CLOPIDOGREL BISULFATE 75 MG PO TABS: 75.0000 mg | ORAL_TABLET | Freq: Every day | ORAL | 1 refills | Status: DC

## 2017-04-17 MED ORDER — ASPIRIN 81 MG PO TBEC: 81.0000 mg | DELAYED_RELEASE_TABLET | Freq: Every day | ORAL | Status: DC

## 2017-04-17 MED ORDER — PSYLLIUM 58.6 % PO POWD: 1.0000 | Freq: Every day | ORAL | 12 refills | Status: DC

## 2017-04-17 NOTE — Progress Notes (Signed)
  Echocardiogram 2D Echocardiogram has been performed.  Summer Mccolgan T Arina Torry 04/17/2017, 8:42 AM

## 2017-04-17 NOTE — Care Management Obs Status (Signed)
[place] NOTIFICATION   Patient Details  Name: Luis [name] MRN: [id number] Date of Birth: 03-06-1946   Medicare Observation Status Notification Given:  Yes    Bethena Roys, RN 04/17/2017, 10:48 AM

## 2017-04-17 NOTE — Care Management Note (Signed)
Case Management Note  Patient Details  Name: Luis [name] MRN: [id number] Date of Birth: December 26, 1945  Subjective/Objective:  Pt presented for Dyspnea. . Recently discharged on 04/10/2017 post emergent CABG on 03/25/2017. Pt is from home with wife and has support of children at the bedside.                    Action/Plan: Pt was previously active with [facility]. [facility] Liaison made aware pt is hospitalized and plan for d/c today. SOC to begin within 24-48 hours post d/c. No orders needed due to Observation Stay. No further needs from CM at this time.    Expected Discharge Date:  04/17/17               Expected Discharge Plan:  [place]  In-House Referral:  NA  Discharge planning Services  CM Consult  Post Acute Care Choice:  NA Choice offered to:  Patient, Spouse  DME Arranged:  N/A DME Agency:  NA  HH Arranged:  RN, PT, OT HH Agency:  [facility]  Status of Service:  Completed, signed off  If discussed at [place] of Stay Meetings, dates discussed:    Additional Comments:  Bethena Roys, RN 04/17/2017, 10:49 AM

## 2017-04-17 NOTE — Progress Notes (Signed)
Patient received all discharge information and education. Him and his wife verbalized their understanding.

## 2017-04-17 NOTE — Discharge Summary (Signed)
Physician Discharge Summary  Patient ID: Luis [name] MRN: [id number] DOB/AGE: [birth year] 71 y.o.  Admit date: 04/16/2017 Discharge date: 04/17/2017  Primary Discharge Diagnosis 1. Acute systolic and diastolic heart failure 2. CAD S/P emergent CABG  with LIMA to LAD and SVG to OM 3. Hypertension, presently hypotensive after diuresis 4. Abnormal EKG with prolonged QT. 5. Hyperglycemia. Significant Diagnostic Studies:  Echocardiogram 04/16/2017: Moderate LV systolic dysfunction, EF 83-41%.  Grade 1 diastolic dysfunction.  Akinesis of the  entire anterior and anteroseptal and anterolateral wall.  Right ventricular size is normal, right ventricular systolic function moderately reduced.  EKG 04/16/2017: Normal sinus rhythm at rate of 77 bpm, nonspecific T-wave flattening, prolonged QT at 521 ms.  Hospital Course: Patient was admitted to the hospital with acute onset of shortness of breath, elevated BNP.  He was diuresed in the emergency room and also placed in the telemetry with intravenous Lasix, excellent diuresis obtained.  Length of stay 2 days, discharged home as she was asymptomatic with regard to heart failure and BNP had decreased significantly.  Recommendations on discharge: The amiodarone dose decreased to 100 mg daily, he'll be followed in the outpatient basis with repeat EKG for follow-up of QT interval.  Furosemide was added to be taken as a p.r.n. basis.  Patient education regarding heart failure and fluid restriction was done.   Discharge Exam: Blood pressure 95/78, pulse 85, temperature 98.7 F (37.1 C), temperature source Oral, resp. rate 16, height 5' 7.5" (1.715 m), weight 76 kg (167 lb 9.6 oz), SpO2 94 %.   General appearance: alert, cooperative, appears stated age and no distress Resp: clear to auscultation bilaterally Chest wall: no tenderness, Sternotomy scar has healed well. Cardio: regular rate and rhythm, S1, S2 normal, no murmur, click, rub or gallop GI: soft,  non-tender; bowel sounds normal; no masses,  no organomegaly Extremities: extremities normal, atraumatic, no cyanosis or edema Pulses: 2+ and symmetric Neurologic: Grossly normal Labs:   Lab Results  Component Value Date   WBC 7.7 04/16/2017   HGB 9.9 (L) 04/16/2017   HCT 30.8 (L) 04/16/2017   MCV 88.3 04/16/2017   PLT 414 (H) 04/16/2017    Recent Labs Lab 04/17/17 0403  NA 134*  K 4.3  CL 101  CO2 24  BUN 12  CREATININE 1.19  CALCIUM 8.5*  GLUCOSE 93    Lipid Panel  No results found for: CHOL, TRIG, HDL, CHOLHDL, VLDL, LDLCALC  BNP (last 3 results)  Recent Labs  04/16/17 0036 04/16/17 0634 04/17/17 0844  BNP 1,028.2* 1,052.5* 494.8*    HEMOGLOBIN A1C Lab Results  Component Value Date   HGBA1C 6.1 (H) 03/26/2017   MPG 128 03/26/2017    Cardiac Panel (last 3 results)  Recent Labs  04/16/17 0634  TROPONINI 0.03*    Lab Results  Component Value Date   TROPONINI 0.03 ([place]) 04/16/2017   Radiology: Dg Chest 1 View  Result Date: 04/02/2017 CLINICAL DATA:  71 year old male with a history of coronary artery bypass grafting. Interval removal of thoracostomy tubes. EXAM: CHEST 1 VIEW COMPARISON:  None. FINDINGS: Interval development of right pneumothorax status post removal of the thoracostomy tube. Using Collins method  % = 4.2 + 4.7 * (A+B+C) where A =1.4cm, B =2.6cm, C = 3.4cm  percentage is estimated 39%. There is slight leftward shift of the tracheal air column, more pronounced than the comparison. Surgical changes of median sternotomy and CABG, with relatively unchanged appearance of the cardiomediastinal silhouette. Interval removal of the left-sided thoracostomy tube  with no evidence of left pneumothorax. Epicardial pacing leads may remain. Coarsened interstitial markings on the left, similar to prior. Unchanged right subclavian central venous catheter with the tip appearing to terminate in the superior vena cava. No displaced fracture IMPRESSION: Development  of right-sided pneumothorax status post thoracostomy tube removal. There may be developing tension given the leftward shift of the tracheal air column. Percentage pneumothorax estimated 39%. Critical Value/emergent results were called by telephone at the time of interpretation on 04/02/2017 at 11:56 am to the nurse caring for the patient, Ms Venia Carbon, who verbally acknowledged these results and confirmed the plan to replace the thoracostomy tube. Interval removal of left-sided thoracostomy tube, with no visualized pneumothorax. Surgical changes of median sternotomy and CABG. Unchanged right subclavian central venous catheter. Epicardial pacing leads appear to remain. Electronically Signed   By: Corrie Mckusick D.O.   On: 04/02/2017 11:57   Dg Chest 2 View  Result Date: 04/16/2017 CLINICAL DATA:  71-year-old male with shortness of breath. Recent CABG surgery. EXAM: CHEST  2 VIEW COMPARISON:  Chest radiograph dated 04/06/2017 FINDINGS: There has been interval removal of the right-sided central venous line. There is overall better aeration of the lungs. Small left pleural effusion and left lung base atelectatic changes similar to prior radiograph. Streaky interstitial and hazy airspace density throughout the left lung similar to prior radiograph. The right lung is clear. Median sternotomy wires and postsurgical changes of CABG noted. No acute osseous pathology. IMPRESSION: Small left pleural effusion and left lung base atelectatic changes similar to prior radiograph. There is overall improved aeration of the lungs. Follow-up recommended. Electronically Signed   By: Anner Crete M.D.   On: 04/16/2017 01:38   Dg Chest Port 1 View  Result Date: 04/06/2017 CLINICAL DATA:  Atelectasis EXAM: PORTABLE CHEST 1 VIEW COMPARISON:  04/05/2017 FINDINGS: Left lower lobe atelectasis/ infiltrate unchanged. Resolution of pleural fluid in the left apex. Mild right lower lobe atelectasis unchanged. CABG. Negative for heart  failure or edema. Central line tip in the lower right atrium unchanged in position. IMPRESSION: Left lower lobe atelectasis/infiltrate unchanged. Left apical pleural effusion improved Mild right lower lobe atelectasis unchanged. Electronically Signed   By: Franchot Gallo M.D.   On: 04/06/2017 07:43   Dg Chest Port 1 View  Result Date: 04/05/2017 CLINICAL DATA:  Evaluate for pneumothorax. EXAM: PORTABLE CHEST 1 VIEW COMPARISON:  April 04, 2017 FINDINGS: The right chest tube is been removed. No pneumothorax. The right central line appears to terminate within the right atrium, 3 cm below the caval atrial junction. Increased bilateral pulmonary opacities may represent mild edema. Thickening of the pleural stripe superiorly on the left suggests pleural fluid. Opacity in the medial right lung base is mildly more focal. IMPRESSION: 1. The right chest tube has been removed without pneumothorax. 2. Increased interstitial opacities suggest edema. 3. Increasing density of the pleural stripe superiorly on the left suggests pleural fluid, not seen previously. 4. Mildly more patchy opacity in the right base could represent edema or atelectasis. Early infiltrate not excluded. Recommend attention on follow-up. Electronically Signed   By: Dorise Bullion III M.D   On: 04/05/2017 06:58   Dg Chest Port 1 View  Result Date: 04/04/2017 CLINICAL DATA:  History chest tube. EXAM: PORTABLE CHEST 1 VIEW COMPARISON:  April 03, 2017 FINDINGS: A right PICC line is in good position as is a pigtail catheter on the right pleural space. No pneumothorax. Cardiomediastinal silhouette is stable. Mild vascular congestion, left greater than  right without overt edema. Mild atelectasis in the left base. IMPRESSION: Support apparatus as above.  Mild vascular congestion, unchanged. Electronically Signed   By: Dorise Bullion III M.D   On: 04/04/2017 07:21   Dg Chest Port 1 View  Result Date: 04/03/2017 CLINICAL DATA:  71 year old male status post 2  vessel coronary artery bypass graft EXAM: PORTABLE CHEST 1 VIEW COMPARISON:  Prior chest x-ray 04/02/2017 FINDINGS: Stable position of a right-sided pigtail thoracostomy tube. No definite residual pneumothorax. Right subclavian approach central venous catheter remains in good position with the tip overlying the mid right atrium. Patient is status post median sternotomy with evidence of multivessel CABG. Persistent low inspiratory volumes with bibasilar atelectasis and perihilar atelectasis. Mild vascular congestion but no overt edema. No acute osseous abnormality. IMPRESSION: 1. No residual pneumothorax with right-sided thoracostomy tube in place. 2. Persistently low inspiratory volumes with bibasilar and perihilar atelectasis. 3. Pulmonary vascular congestion without overt pulmonary edema. Electronically Signed   By: Jacqulynn Cadet M.D.   On: 04/03/2017 07:41   Dg Chest Port 1 View  Result Date: 04/02/2017 CLINICAL DATA:  Status post CABG on March 25, 2017 with ASD repair. Removal of Impella left ventricular assist device 2 days ago. Interval placement of small caliber chest tube on the right for right-sided pneumothorax. EXAM: PORTABLE CHEST 1 VIEW COMPARISON:  Chest x-ray of earlier today FINDINGS: The right lung is nearly totally re- expanded. A tiny amount of air is seen still in the apex. The mediastinal shift has resolved. The small caliber chest tube overlies the posterior aspect of the right seventh rib. The left lung is also hypoinflated. There is no pneumothorax. The perihilar lung markings remain increased. The heart is top-normal in size. The central pulmonary vascularity is prominent. The right subclavian catheter tip projects over the distal third of the SVC. IMPRESSION: Interval near total re-expansion of the right lung. Both lungs remain hypoinflated. Persistent interstitial prominence greatest on the left likely reflects low-grade pulmonary edema. Electronically Signed   By: David  Martinique M.D.    On: 04/02/2017 12:45   Dg Chest Port 1 View  Result Date: 04/02/2017 CLINICAL DATA:  Chest tubes. EXAM: PORTABLE CHEST 1 VIEW COMPARISON:  04/01/2017. FINDINGS: Interim removal of Swan-Ganz catheter and mediastinal drainage catheter. Bilateral chest tubes in stable position. Right subclavian line in stable position. Prior CABG. Mild pulmonary venous congestion Interim clearing of basilar atelectasis and edema with mild residual subsegmental atelectasis. Small left pleural effusion. No pneumothorax . IMPRESSION: 1.Interim removal Swan-Ganz catheter mediastinal drainage catheter. Bilateral chest tubes in stable position. Right subclavian line stable position. No pneumothorax. 2. Prior CABG.  Mild pulmonary venous congestion . 3. Interim clearing of basilar atelectasis and edema with mild residual subsegmental atelectasis. Electronically Signed   By: Marcello Moores  Register   On: 04/02/2017 07:17   Dg Chest Port 1 View  Result Date: 04/01/2017 CLINICAL DATA:  CABG EXAM: PORTABLE CHEST 1 VIEW COMPARISON:  03/31/2017 FINDINGS: Changes of CABG. Bilateral chest tubes remain in place. No pneumothorax. Right central lines including Swan-Ganz catheter are unchanged. Interval extubation. Mild vascular congestion and bibasilar atelectasis. IMPRESSION: Postoperative changes.  No pneumothorax. Vascular congestion, bibasilar atelectasis. Electronically Signed   By: Rolm Baptise M.D.   On: 04/01/2017 07:58   Dg Chest Portable 1 View  Result Date: 03/31/2017 CLINICAL DATA:  71 year old male. Removal of left ventricular assist device. Placement right central line. Subsequent encounter. EXAM: PORTABLE CHEST 1 VIEW COMPARISON:  03/31/2017 6:01 a.m. chest x-ray. FINDINGS: Endotracheal  tube placement with tip 2.7 cm above the carina. Removal of Impella device. Right central line placement with tip in the region of the right atrium. To be within the distal superior vena cava, this can be retracted by 3.8 cm. Right Swan-Ganz catheter  tip main pulmonary artery level. Mediastinal drains and bilateral chest tubes in place without pneumothorax. Subsegmental atelectatic changes greatest right lung base right upper lobe. Central pulmonary vascular prominence. Cardiomegaly. IMPRESSION: Endotracheal tube placement with tip 2.7 cm above the carina. Removal of Impella device. Right central line placement with tip in the region of the right atrium. To be within the distal superior vena cava, this can be retracted by 3.8 cm. Subsegmental atelectatic changes greatest right lung base right upper lobe. Central pulmonary vascular prominence. Cardiomegaly. Electronically Signed   By: Genia Del M.D.   On: 03/31/2017 10:07   Dg Chest Port 1 View  Result Date: 03/31/2017 CLINICAL DATA:  71 year old male post CABG. Left ventricular assist device. Subsequent encounter. EXAM: PORTABLE CHEST 1 VIEW COMPARISON:  03/30/2017. FINDINGS: Bilateral chest tubes in place without pneumothorax detected. Right Swan-Ganz catheter tip main pulmonary artery level. Impella heart pump unchanged in position. Mediastinal drains in place. Mediastinal and cardiac silhouette stable. Central pulmonary vascular prominence greater on the left. IMPRESSION: Overall, no significant change. Pulmonary vascular prominence greatest centrally and more notable on the left. Electronically Signed   By: Genia Del M.D.   On: 03/31/2017 07:38   Dg Chest Port 1 View  Result Date: 03/30/2017 CLINICAL DATA:  CABG. EXAM: PORTABLE CHEST 1 VIEW COMPARISON:  03/29/2017. FINDINGS: Bilateral chest tubes, Swan-Ganz catheter, mediastinal drainage catheters, Impella device in stable position. Prior CABG. Heart size stable. Improvement of bilateral interstitial prominence and bilateral pleural effusions suggesting improving CHF. No pneumothorax . IMPRESSION: 1. Lines and tubes including bilateral chest tubes in stable position. 2. Prior CABG. Improvement of bilateral interstitial prominence and bilateral  pleural effusions consistent with improving CHF . Electronically Signed   By: Marcello Moores  Register   On: 03/30/2017 07:26   Dg Chest Port 1 View  Result Date: 03/29/2017 CLINICAL DATA:  CABG EXAM: PORTABLE CHEST 1 VIEW COMPARISON:  03/28/2017 FINDINGS: Changes of CABG. Bilateral chest tubes remain in place. No pneumothorax. Swan-Ganz catheter remains in the main pulmonary artery, stable. Small right pleural effusion. Bibasilar atelectasis, right greater than left and mild vascular congestion. Decreasing left effusion since prior study. IMPRESSION: Continued bibasilar atelectasis, right greater than left and vascular congestion. Small right effusion.  Decreasing left effusion. No pneumothorax. Electronically Signed   By: Rolm Baptise M.D.   On: 03/29/2017 10:06   Dg Chest Port 1 View  Result Date: 03/28/2017 CLINICAL DATA:  Post cardiac surgery, LEFT ventricular assist device EXAM: PORTABLE CHEST 1 VIEW COMPARISON:  Portable exam 0519 hours compared to 03/27/2017 FINDINGS: Interval removal of nasogastric and endotracheal tubes. Impella device unchanged breaking over RIGHT heart. RIGHT jugular Swan-Ganz catheter with tip projecting over proximal RIGHT pulmonary artery. Mediastinal drain and BILATERAL thoracostomy tubes present. Epicardial pacing wires present. Stable heart size post CABG. Bibasilar effusions and atelectasis. No pneumothorax. IMPRESSION: Persistent bibasilar effusions and atelectasis. Stable support devices. Electronically Signed   By: Lavonia Dana M.D.   On: 03/28/2017 09:13   Dg Chest Port 1 View  Result Date: 03/27/2017 CLINICAL DATA:  Coronary bypass postop imaging EXAM: PORTABLE CHEST 1 VIEW COMPARISON:  03/26/2017 FINDINGS: Endotracheal tube is 4 cm above the carina. Enteric tube extends into the stomach and beyond the inferior edge  of the image. Right jugular Swan-Ganz catheter tip is in the main pulmonary artery. Chest tubes and mediastinal drains are present. No pneumothorax. Probable  small pleural effusions. Mild central and basilar airspace opacities have partially cleared compared to 03/26/2017. No new or worsening opacities. IMPRESSION: 1.  Support equipment appears satisfactorily positioned. 2. Slight improvement in the bilateral airspace opacities. Unchanged small effusions. Electronically Signed   By: Andreas Newport M.D.   On: 03/27/2017 04:58   Dg Chest Port 1 View  Result Date: 03/26/2017 CLINICAL DATA:  Prior CABG. EXAM: PORTABLE CHEST 1 VIEW COMPARISON:  03/25/2017. FINDINGS: Endotracheal tube, NG tube, Swan-Ganz catheter, mediastinal drainage catheter, bilateral chest tubes in stable position. No pneumothorax. Prior CABG. Diffuse but improved bilateral pulmonary infiltrates/edema. Stable bibasilar subsegmental atelectasis and small right pleural effusion. IMPRESSION: 1. Lines and tubes including bilateral chest tubes in stable position. No pneumothorax. 2. Persistent but improved bilateral pulmonary edema. Mild bibasilar subsegmental atelectasis again noted. Small right pleural effusion again noted. Electronically Signed   By: Marcello Moores  Register   On: 03/26/2017 07:41   Dg Chest Port 1 View  Result Date: 03/25/2017 CLINICAL DATA:  CABG. EXAM: PORTABLE CHEST 1 VIEW COMPARISON:  09/31/2016 . FINDINGS: Endotracheal tube tip noted 1.9 cm above the carina. NG tube tip noted below left hemidiaphragm. Swan-Ganz catheter tip noted in the main pulmonary artery. Mediastinal drainage catheters in good anatomic position. Bilateral chest tubes in good anatomic position. Heart size normal. Diffuse bilateral pulmonary alveolar infiltrates consistent pulmonary edema noted. Low lung volumes. Small right pleural effusion cannot be excluded. No pneumothorax . IMPRESSION: 1. Prior CABG. Lines and tubes including bilateral chest tubes as above. No pneumothorax. 2. Bilateral pulmonary infiltrates most consistent with pulmonary edema. Lung volumes with basilar atelectasis. Small right pleural  effusion. Electronically Signed   By: Marcello Moores  Register   On: 03/25/2017 17:30   FOLLOW UP PLANS AND APPOINTMENTS  Allergies as of 04/17/2017      Reactions   No Known Allergies       Medication List    TAKE these medications   amiodarone 200 MG tablet Commonly known as:  PACERONE Take 0.5 tablets (100 mg total) by mouth daily. What changed:  how much to take   aspirin 81 MG EC tablet Take 1 tablet (81 mg total) by mouth daily. What changed:  medication strength  how much to take   carvedilol 3.125 MG tablet Commonly known as:  COREG Take 1 tablet (3.125 mg total) by mouth 2 (two) times daily with a meal.   clopidogrel 75 MG tablet Commonly known as:  PLAVIX Take 1 tablet (75 mg total) by mouth daily.   clotrimazole-betamethasone cream Commonly known as:  LOTRISONE APPLY TO AFFECED AREA LOCALLY TWICE DAILY AS NEEDED FOR RASH   digoxin 0.125 MG tablet Commonly known as:  LANOXIN Take 1 tablet (0.125 mg total) by mouth daily.   furosemide 20 MG tablet Commonly known as:  LASIX Take 2 tablets (40 mg total) by mouth daily as needed (dyspnea).   gabapentin 100 MG capsule Commonly known as:  NEURONTIN Take 2 capsules (200 mg total) by mouth 3 (three) times daily.   losartan 25 MG tablet Commonly known as:  COZAAR Take 0.5 tablets (12.5 mg total) by mouth at bedtime.   oxyCODONE 5 MG immediate release tablet Commonly known as:  Oxy IR/ROXICODONE Take 1 tablet (5 mg total) by mouth every 6 (six) hours as needed for severe pain.   psyllium 58.6 % powder Commonly  known as:  METAMUCIL SMOOTH TEXTURE Take 1 packet by mouth daily after supper.   rosuvastatin 40 MG tablet Commonly known as:  CRESTOR Take 40 mg by mouth daily.   spironolactone 25 MG tablet Commonly known as:  ALDACTONE Take 0.5 tablets (12.5 mg total) by mouth daily.      Follow-up Information    [name], MD Follow up on 04/27/2017.   Specialty:  Cardiology Why:  We will call you Contact  information: [place] [address] [place] Follow up.   Why:  Registered Nurse Contact information: [address] [place] [address] [phone]          [name], MD 04/17/2017, 10:21 AM  Pager: [phone] Office: [phone] If no answer: [phone]

## 2017-05-05 ENCOUNTER — Other Ambulatory Visit: Payer: Self-pay | Admitting: Cardiothoracic Surgery

## 2017-05-05 DIAGNOSIS — Z951 Presence of aortocoronary bypass graft: Secondary | ICD-10-CM

## 2017-05-06 ENCOUNTER — Encounter: Payer: Self-pay | Admitting: Cardiothoracic Surgery

## 2017-05-06 ENCOUNTER — Ambulatory Visit
Admission: RE | Admit: 2017-05-06 | Discharge: 2017-05-06 | Disposition: A | Discharge status: Home / Self Care | Payer: Medicare Other | Source: Ambulatory Visit | Attending: Cardiothoracic Surgery | Admitting: Cardiothoracic Surgery

## 2017-05-06 ENCOUNTER — Ambulatory Visit (INDEPENDENT_AMBULATORY_CARE_PROVIDER_SITE_OTHER): Payer: Self-pay | Admitting: Cardiothoracic Surgery

## 2017-05-06 VITALS — BP 101/62 | HR 82 | Resp 20 | Ht 67.5 in | Wt 163.8 lb

## 2017-05-06 DIAGNOSIS — Z95811 Presence of heart assist device: Secondary | ICD-10-CM

## 2017-05-06 DIAGNOSIS — Z951 Presence of aortocoronary bypass graft: Secondary | ICD-10-CM

## 2017-05-06 DIAGNOSIS — IMO0002 Reserved for concepts with insufficient information to code with codable children: Secondary | ICD-10-CM

## 2017-05-08 ENCOUNTER — Other Ambulatory Visit: Payer: Self-pay | Admitting: Surgical

## 2017-05-11 ENCOUNTER — Ambulatory Visit (HOSPITAL_COMMUNITY)
Admit: 2017-05-11 | Discharge: 2017-05-11 | Disposition: A | Discharge status: Home / Self Care | Payer: Medicare Other | Attending: Internal Medicine | Admitting: Internal Medicine

## 2017-05-11 ENCOUNTER — Encounter (HOSPITAL_COMMUNITY): Payer: Self-pay | Admitting: Internal Medicine

## 2017-05-11 VITALS — BP 112/64 | HR 87 | Wt 164.0 lb

## 2017-05-11 DIAGNOSIS — Z7982 Long term (current) use of aspirin: Secondary | ICD-10-CM | POA: Diagnosis not present

## 2017-05-11 DIAGNOSIS — Z951 Presence of aortocoronary bypass graft: Secondary | ICD-10-CM | POA: Diagnosis not present

## 2017-05-11 DIAGNOSIS — E1122 Type 2 diabetes mellitus with diabetic chronic kidney disease: Secondary | ICD-10-CM | POA: Diagnosis not present

## 2017-05-11 DIAGNOSIS — I480 Paroxysmal atrial fibrillation: Secondary | ICD-10-CM | POA: Diagnosis not present

## 2017-05-11 DIAGNOSIS — Z79899 Other long term (current) drug therapy: Secondary | ICD-10-CM | POA: Insufficient documentation

## 2017-05-11 DIAGNOSIS — N182 Chronic kidney disease, stage 2 (mild): Secondary | ICD-10-CM | POA: Diagnosis not present

## 2017-05-11 DIAGNOSIS — I2510 Atherosclerotic heart disease of native coronary artery without angina pectoris: Secondary | ICD-10-CM | POA: Diagnosis not present

## 2017-05-11 DIAGNOSIS — E785 Hyperlipidemia, unspecified: Secondary | ICD-10-CM | POA: Diagnosis not present

## 2017-05-11 DIAGNOSIS — Z7902 Long term (current) use of antithrombotics/antiplatelets: Secondary | ICD-10-CM | POA: Insufficient documentation

## 2017-05-11 DIAGNOSIS — I5022 Chronic systolic (congestive) heart failure: Secondary | ICD-10-CM | POA: Diagnosis not present

## 2017-05-11 DIAGNOSIS — R42 Dizziness and giddiness: Secondary | ICD-10-CM | POA: Insufficient documentation

## 2017-05-11 DIAGNOSIS — G4733 Obstructive sleep apnea (adult) (pediatric): Secondary | ICD-10-CM | POA: Insufficient documentation

## 2017-05-11 MED ORDER — FUROSEMIDE 20 MG PO TABS: 40.0000 mg | ORAL_TABLET | Freq: Every day | ORAL | 0 refills | Status: DC | PRN

## 2017-05-11 NOTE — Patient Instructions (Signed)
Take Furosemide (Lasix) for weight of 165 lb or greather  Follow up with [place] as needed

## 2017-05-11 NOTE — Addendum Note (Signed)
Encounter addended by: Scarlette Calico, RN on: 05/11/2017 10:09 AM<BR>    Actions taken: Order list changed

## 2017-05-11 NOTE — Addendum Note (Signed)
Encounter addended by: Scarlette Calico, RN on: 05/11/2017 10:08 AM<BR>    Actions taken: Sign clinical note

## 2017-05-11 NOTE — Progress Notes (Signed)
ADVANCED HF CLINIC NOTE  Primary Cardiologist: [name]   HPI:  71 y/o male with h/o DM2, HL, OSA, systolic HF and CAD s/p CABG 6/19 (LIMA to LAD and SVG to OM) in setting of acute LM dissection.  Underwent attempted PCI of totalled LAD in 9/60 complicated by LM dissection. Taken emergently to OR on 03/25/17 for CABG and Impella placement. Post-op course was slow and complicated by PAF and recurrent PTX requiring CT placement.  D/c'd home on 04/10/17. Readmitted on 04/16/17 with HF in setting of drinking lots of fluid. . Echo EF 35-40%. Diuresed and discharged home. Feels better. Chest remains sore. Has been referred to CR but hasn't started yet. Weighing himself everyday. Weight stable 161-163. Not taking lasix any more. Watching fluid intake closely. SBP running 95-110. Occasionally dizzy. Walks every day. No problems with ADLs.    Past Medical History:  Diagnosis Date  . Chronic kidney disease    kidney stones; noted per H&P per Dr [name] 02/06/2015 chronic kidney disease stage II  . Diabetes mellitus without complication ([place])    per H&P Dr [name] noted to be prediabetic 02/06/2015  . Dyslipidemia   . History of hepatomegaly    with fatty liver discussed per H&P per Dr [name] 02/06/2015 secondary to abd ultrasound   . Sleep apnea    AHI 17 on sleep study 08/23/2013 pt states was given CPAP machine but sent it back - does not use   . Vitamin D deficiency    as noted per H&P per Dr [name] 02/06/2015    SH:  Social History   Social History  . Marital status: Married    Spouse name: N/A  . Number of children: N/A  . Years of education: N/A   Occupational History  . Not on file.   Social History Main Topics  . Smoking status: Never Smoker  . Smokeless tobacco: Never Used  . Alcohol use No  . Drug use: No  . Sexual activity: Not on file   Other Topics Concern  . Not on file   Social History Narrative  . No narrative on file     Current Outpatient Prescriptions    Medication Sig Dispense Refill  . amiodarone (PACERONE) 200 MG tablet Take 0.5 tablets (100 mg total) by mouth daily. 30 tablet 0  . aspirin 81 MG EC tablet Take 1 tablet (81 mg total) by mouth daily.    . carvedilol (COREG) 3.125 MG tablet Take 3.125 mg by mouth 3 (three) times daily.    . clopidogrel (PLAVIX) 75 MG tablet Take 1 tablet (75 mg total) by mouth daily. 90 tablet 1  . clotrimazole-betamethasone (LOTRISONE) cream APPLY TO AFFECED AREA LOCALLY TWICE DAILY AS NEEDED FOR RASH  1  . digoxin (LANOXIN) 0.125 MG tablet Take 1 tablet (0.125 mg total) by mouth daily. 30 tablet 1  . gabapentin (NEURONTIN) 100 MG capsule Take 2 capsules (200 mg total) by mouth 3 (three) times daily. 90 capsule 0  . losartan (COZAAR) 25 MG tablet Take 0.5 tablets (12.5 mg total) by mouth at bedtime. 15 tablet 1  . magnesium citrate SOLN Take 3-4 mLs by mouth once.    . psyllium (METAMUCIL SMOOTH TEXTURE) 58.6 % powder Take 1 packet by mouth daily after supper. 283 g 12  . rosuvastatin (CRESTOR) 40 MG tablet Take 40 mg by mouth daily.  4  . spironolactone (ALDACTONE) 25 MG tablet Take 0.5 tablets (12.5 mg total) by mouth daily. 15 tablet 1  .  furosemide (LASIX) 20 MG tablet Take 2 tablets (40 mg total) by mouth daily as needed (dyspnea). (Patient not taking: Reported on 05/11/2017) 30 tablet 0   No current facility-administered medications for this encounter.     Vitals:   05/11/17 0943  BP: 112/64  Pulse: 87  SpO2: 99%  Weight: 164 lb (74.4 kg)    PHYSICAL EXAM:  General:  Well appearing. No resp difficulty HEENT: normal Neck: supple. JVP flat. Carotids 2+ bilaterally; no bruits. No lymphadenopathy or thryomegaly appreciated. Cor: Sternal wound healing well. PMI normal. Regular rate & rhythm. No rubs, gallops or murmurs. Lungs: clear Abdomen: soft, nontender, nondistended. No hepatosplenomegaly. No bruits or masses. Good bowel sounds. Extremities: no cyanosis, clubbing, rash, edema Neuro: alert &  orientedx3, cranial nerves grossly intact. Moves all 4 extremities w/o difficulty. Affect pleasant.   ASSESSMENT & PLAN: 1. Chronic systolic HF - Due to iCM. EF 35-40% (7/18) - s/p CABG 6/18 - NYHA II. Volume status ok.  - on excellent medical regimen. Continue carvedilol, losartan, dig and spiro - BP too low to titrate meds or switch to Entresto - If weight 165 or over take a dose of lasix.   2. CAD s/p emergent CABG due to LM dissection in setting of attempted PCI of tLAD - doing well. No s/s ischemia - continue ASA, statin, Plavix - has referral to CR.   3. PAF - maintaining NSR on amio, Can likely stop in one month.   Has labs in one week. Can f/u with Dr. [name]. We are ahppy to see gaian as needed.   Glori Bickers, MD  9:53 AM

## 2017-05-27 ENCOUNTER — Ambulatory Visit (INDEPENDENT_AMBULATORY_CARE_PROVIDER_SITE_OTHER): Payer: Self-pay | Admitting: Cardiothoracic Surgery

## 2017-05-27 ENCOUNTER — Encounter: Payer: Self-pay | Admitting: Cardiothoracic Surgery

## 2017-05-27 VITALS — BP 91/62 | HR 78 | Resp 16 | Ht 67.5 in | Wt 163.2 lb

## 2017-05-27 DIAGNOSIS — Z8774 Personal history of (corrected) congenital malformations of heart and circulatory system: Secondary | ICD-10-CM

## 2017-05-27 DIAGNOSIS — Z951 Presence of aortocoronary bypass graft: Secondary | ICD-10-CM

## 2017-05-27 DIAGNOSIS — Z95811 Presence of heart assist device: Secondary | ICD-10-CM

## 2017-05-27 NOTE — Progress Notes (Signed)
PCP is [name], MD Referring Provider is [name], MD  Chief Complaint  Patient presents with  . Routine Post Op    3-4 wk f/u.Luis KitchenNO CXR..s/p CABG X 2, ASD REPAIR, IMPELLA PLACEMENT 03/25/17 (removal postop)    HPI: Scheduled office visit one month after emergency CABG for dissection of left main during attempted PCI of occluded LAD. Patient is progressing well. He is ambulating well. He has no shortness of breath or symptoms of angina. Surgical incisions are healing well. Last chest x-ray was clear. He is not on a beta blocker because of blood pressure 90 systolic. He maintained sinus rhythm. He is ready to start driving and to start outpatient cardiac rehabilitation at [facility] hospital which for which he will be referred.   Past Medical History:  Diagnosis Date  . Chronic kidney disease    kidney stones; noted per H&P per Dr [name] 02/06/2015 chronic kidney disease stage II  . Diabetes mellitus without complication ([place])    per H&P Dr [name] noted to be prediabetic 02/06/2015  . Dyslipidemia   . History of hepatomegaly    with fatty liver discussed per H&P per Dr [name] 02/06/2015 secondary to abd ultrasound   . Sleep apnea    AHI 17 on sleep study 08/23/2013 pt states was given CPAP machine but sent it back - does not use   . Vitamin D deficiency    as noted per H&P per Dr [name] 02/06/2015    Past Surgical History:  Procedure Laterality Date  . ASD REPAIR N/A 03/25/2017   Procedure: ATRIAL SEPTAL DEFECT (ASD) REPAIR;  Surgeon: [name], MD;  Location: [place];  Service: Open Heart Surgery;  Laterality: N/A;  . CORONARY ARTERY BYPASS GRAFT N/A 03/25/2017   Procedure: CORONARY ARTERY BYPASS GRAFTING (CABG) x 2 using left internal mammary artery and right greater saphenous leg vein using endosciope.;  Surgeon: [name], MD;  Location: [place];  Service: Open Heart Surgery;  Laterality: N/A;  . CYSTOSCOPY W/ RETROGRADES Left 03/26/2015   Procedure: CYSTOSCOPY WITH  RETROGRADE PYELOGRAM;  Surgeon: [name], MD;  Location: [facility];  Service: Urology;  Laterality: Left;  . LEFT HEART CATH AND CORONARY ANGIOGRAPHY N/A 03/25/2017   Procedure: Left Heart Cath and Coronary Angiography;  Surgeon: [name], MD;  Location: [place] [facility];  Service: Cardiovascular;  Laterality: N/A;  . NO PAST SURGERIES    . PLACEMENT OF IMPELLA LEFT VENTRICULAR ASSIST DEVICE  03/25/2017   Procedure: PLACEMENT OF IMPELLA LEFT VENTRICULAR ASSIST DEVICE;  Surgeon: [name], MD;  Location: [place];  Service: Open Heart Surgery;;  . REMOVAL OF IMPELLA LEFT VENTRICULAR ASSIST DEVICE N/A 03/31/2017   Procedure: REMOVAL OF IMPELLA LEFT VENTRICULAR ASSIST DEVICE;  Surgeon: [name], MD;  Location: [place];  Service: Open Heart Surgery;  Laterality: N/A;  . ROBOT ASSISTED PYELOPLASTY Left 03/26/2015   Procedure: ROBOTIC ASSISTED PYELOPLASTY WITH STENT PLACEMENT, REMOVAL OF STONE AND CYST DECORTICATION;  Surgeon: [name], MD;  Location: [facility];  Service: Urology;  Laterality: Left;  . TEE WITHOUT CARDIOVERSION N/A 03/25/2017   Procedure: TRANSESOPHAGEAL ECHOCARDIOGRAM (TEE);  Surgeon: [name], [name], MD;  Location: [place];  Service: Open Heart Surgery;  Laterality: N/A;  . TEE WITHOUT CARDIOVERSION N/A 03/31/2017   Procedure: TRANSESOPHAGEAL ECHOCARDIOGRAM (TEE);  Surgeon: [name], [name], MD;  Location: [place];  Service: Open Heart Surgery;  Laterality: N/A;    No family history on file.  Social History Social  History  Substance Use Topics  . Smoking status: Never Smoker  . Smokeless tobacco: Never Used  . Alcohol use No    Current Outpatient Prescriptions  Medication Sig Dispense Refill  . aspirin 81 MG EC tablet Take 1 tablet (81 mg total) by mouth daily.    . clopidogrel (PLAVIX) 75 MG tablet Take 1 tablet (75 mg total) by mouth daily. 90 tablet 1  . magnesium citrate SOLN Take 3-4 mLs by mouth once.    . psyllium (METAMUCIL SMOOTH TEXTURE) 58.6 % powder  Take 1 packet by mouth daily after supper. 283 g 12  . rosuvastatin (CRESTOR) 40 MG tablet Take 40 mg by mouth daily.  4   No current facility-administered medications for this visit.     Allergies  Allergen Reactions  . No Known Allergies     Review of Systems  Generally improved appetite strength and sleeping pattern  BP 91/62 (BP Location: Left Arm, Patient Position: Sitting, Cuff Size: Large)   Pulse 78   Resp 16   Ht 5' 7.5" (1.715 m)   Wt 163 lb 3.2 oz (74 kg)   SpO2 97% Comment: ON RA  BMI 25.18 kg/m  Physical Exam      Exam    General- alert and comfortable   Lungs- clear without rales, wheezes   Cor- regular rate and rhythm, no murmur , gallop   Abdomen- soft, non-tender   Extremities - warm, non-tender, minimal edema   Neuro- oriented, appropriate, no focal weakness   Diagnostic Tests: None today  Impression: Excellent early recovery after emergency CABG Plan: Patient can drive, lift up to 20 pounds maximum. He needs to follow sternal precautions until 3 months after surgery. He will be referred to outpatient cardiac rehabilitation at [facility]. He will follow directions for medications as per Dr. [name] He'll return as needed. Len Childs, MD [facility] [phone]

## 2017-06-02 ENCOUNTER — Telehealth (HOSPITAL_COMMUNITY): Payer: Self-pay | Admitting: *Deleted

## 2017-06-02 NOTE — Telephone Encounter (Signed)
Left message for pt to return call for scheduling for cardiac rehab. Phone rang back immediately.  Pt returned call and scheduled to attend orientation on 9/11. Cherre Huger, BSN Cardiac and Pulmonary Rehab Nurse Navigator '

## 2017-06-03 ENCOUNTER — Telehealth (HOSPITAL_COMMUNITY): Payer: Self-pay | Admitting: Pharmacy Technician

## 2017-06-03 NOTE — Addendum Note (Signed)
Addended by: Fanny Bien R on: 06/03/2017 12:09 PM   Modules accepted: Orders

## 2017-06-03 NOTE — Telephone Encounter (Signed)
Cardiac Rehab Medication Review by a Pharmacist  Does the patient  feel that his/her medications are working for him/her?  yes  Has the patient been experiencing any side effects to the medications prescribed?  no  Does the patient measure his/her own blood pressure or blood glucose at home?  yes   Does the patient have any problems obtaining medications due to transportation or finances?   no  Understanding of regimen: good Understanding of indications: good Potential of compliance: good    Pharmacist comments: Mr. Dredge and his wife, who helps manage his medications, both confirmed his current med list and denied any problems with medications at this time.  They both were able to confirm the date and time of the cardiac rehab appointment.   Bertis Ruddy, PharmD Pharmacy Resident Pager #: [phone] 06/03/2017 4:02 PM

## 2017-06-08 ENCOUNTER — Telehealth (HOSPITAL_COMMUNITY): Payer: Self-pay

## 2017-06-08 NOTE — Telephone Encounter (Signed)
*  Updated insurance benefits* BC[employer] Medicare - no co-payment, no deductible, out of pocket $6700/$2744.16 has been met, no co-insurance and no pre-authorization. Passport/reference [phone].

## 2017-06-09 ENCOUNTER — Encounter (HOSPITAL_COMMUNITY)
Admission: RE | Admit: 2017-06-09 | Discharge: 2017-06-09 | Disposition: A | Discharge status: Home / Self Care | Payer: Medicare Other | Source: Ambulatory Visit | Attending: Cardiology | Admitting: Cardiology

## 2017-06-09 ENCOUNTER — Encounter (HOSPITAL_COMMUNITY): Payer: Self-pay

## 2017-06-09 VITALS — BP 110/60 | HR 75 | Ht 67.0 in | Wt 168.0 lb

## 2017-06-09 DIAGNOSIS — I5022 Chronic systolic (congestive) heart failure: Secondary | ICD-10-CM

## 2017-06-09 DIAGNOSIS — Z48812 Encounter for surgical aftercare following surgery on the circulatory system: Secondary | ICD-10-CM | POA: Diagnosis not present

## 2017-06-09 DIAGNOSIS — Z951 Presence of aortocoronary bypass graft: Secondary | ICD-10-CM | POA: Diagnosis present

## 2017-06-09 NOTE — Progress Notes (Signed)
Cardiac Individual Treatment Plan  Patient Details  Name: Luis [name] MRN: [id number] Date of Birth: 1946-06-24 Referring Provider:     CARDIAC REHAB PHASE II ORIENTATION from 06/09/2017 in [facility]  Referring Provider  Kela Millin MD      Initial Encounter Date:    CARDIAC REHAB PHASE II ORIENTATION from 06/09/2017 in [facility]  Date  06/09/17  Referring Provider  Kela Millin MD      Visit Diagnosis: S/P CABG x 2  Chronic systolic congestive heart failure ([place])  Patient's Home Medications on Admission:  Current Outpatient Prescriptions:  .  aspirin 81 MG EC tablet, Take 1 tablet (81 mg total) by mouth daily., Disp: , Rfl:  .  carvedilol (COREG) 3.125 MG tablet, Take 3.125 mg by mouth 3 (three) times daily., Disp: , Rfl:  .  clopidogrel (PLAVIX) 75 MG tablet, Take 1 tablet (75 mg total) by mouth daily., Disp: 90 tablet, Rfl: 1 .  Multiple Vitamin (MULTIVITAMIN) capsule, Take 1 capsule by mouth daily., Disp: , Rfl:  .  psyllium (METAMUCIL SMOOTH TEXTURE) 58.6 % powder, Take 1 packet by mouth daily after supper. (Patient taking differently: Take 1 packet by mouth daily as needed. ), Disp: 283 g, Rfl: 12 .  rosuvastatin (CRESTOR) 40 MG tablet, Take 40 mg by mouth daily., Disp: , Rfl: 4  Past Medical History: Past Medical History:  Diagnosis Date  . Chronic kidney disease    kidney stones; noted per H&P per Dr [name] 02/06/2015 chronic kidney disease stage II  . Diabetes mellitus without complication ([place])    per H&P Dr [name] noted to be prediabetic 02/06/2015  . Dyslipidemia   . History of hepatomegaly    with fatty liver discussed per H&P per Dr [name] 02/06/2015 secondary to abd ultrasound   . Sleep apnea    AHI 17 on sleep study 08/23/2013 pt states was given CPAP machine but sent it back - does not use   . Vitamin D deficiency    as noted per H&P per Dr [name] 02/06/2015    Tobacco  Use: History  Smoking Status  . Never Smoker  Smokeless Tobacco  . Never Used    Labs: Recent Review Flowsheet Data    Labs for ITP Cardiac and Pulmonary Rehab Latest Ref Rng & Units 04/06/2017 04/07/2017 04/07/2017 04/08/2017 04/09/2017   Hemoglobin A1c 4.8 - 5.6 % - - - - -   PHART 7.350 - 7.450 - - - - -   PCO2ART 32.0 - 48.0 mmHg - - - - -   HCO3 20.0 - 28.0 mmol/L - - - - -   TCO2 0 - 100 mmol/L - - - - -   ACIDBASEDEF 0.0 - 2.0 mmol/L - - - - -   O2SAT % 54.4 49.3 58.3 56.6 64.4      Capillary Blood Glucose: Lab Results  Component Value Date   GLUCAP 108 (H) 04/10/2017   GLUCAP 107 (H) 04/10/2017   GLUCAP 131 (H) 04/09/2017   GLUCAP 98 04/09/2017   GLUCAP 118 (H) 04/09/2017     Exercise Target Goals: Date: 06/09/17  Exercise Program Goal: Individual exercise prescription set with THRR, safety & activity barriers. Participant demonstrates ability to understand and report RPE using BORG scale, to self-measure pulse accurately, and to acknowledge the importance of the exercise prescription.  Exercise Prescription Goal: Starting with aerobic activity 30 plus minutes a day, 3 days per week for initial exercise prescription.  Provide home exercise prescription and guidelines that participant acknowledges understanding prior to discharge.  Activity Barriers & Risk Stratification:     Activity Barriers & Cardiac Risk Stratification - 06/09/17 0919      Activity Barriers & Cardiac Risk Stratification   Activity Barriers Deconditioning;Muscular Weakness;Assistive Device;Other (comment)   Comments B knee discomfort when climbing stairs or incline   Cardiac Risk Stratification High      6 Minute Walk:     6 Minute Walk    Row Name 06/09/17 1055         6 Minute Walk   Phase Initial     Distance 882 feet     Walk Time 6 minutes     # of Rest Breaks 0     MPH 1.67     METS 2.3     RPE 15     VO2 Peak 8.04     Symptoms Yes (comment)     Comments fatigue!      Resting HR 75 bpm     Resting BP 110/60     Resting Oxygen Saturation  98 %     Exercise Oxygen Saturation  during 6 min walk 98 %     Max Ex. HR 99 bpm     Max Ex. BP 140/80     2 Minute Post BP 100/54        Oxygen Initial Assessment:   Oxygen Re-Evaluation:   Oxygen Discharge (Final Oxygen Re-Evaluation):   Initial Exercise Prescription:     Initial Exercise Prescription - 06/09/17 1000      Date of Initial Exercise RX and Referring Provider   Date 06/09/17   Referring Provider Kela Millin MD     Recumbant Bike   Level 1.5   Minutes 10   METs 1.5     NuStep   Level 2   SPM 70   Minutes 10   METs 1.5     Track   Laps 6   Minutes 10   METs 2.03     Prescription Details   Frequency (times per week) 3   Duration Progress to 30 minutes of continuous aerobic without signs/symptoms of physical distress     Intensity   THRR 40-80% of Max Heartrate 60-120   Ratings of Perceived Exertion 11-13   Perceived Dyspnea 0-4     Progression   Progression Continue to progress workloads to maintain intensity without signs/symptoms of physical distress.     Resistance Training   Training Prescription Yes   Weight 2lbs   Reps 10-15      Perform Capillary Blood Glucose checks as needed.  Exercise Prescription Changes:   Exercise Comments:   Exercise Goals and Review:      Exercise Goals    Row Name 06/09/17 0920             Exercise Goals   Increase Physical Activity Yes       Intervention Provide advice, education, support and counseling about physical activity/exercise needs.;Develop an individualized exercise prescription for aerobic and resistive training based on initial evaluation findings, risk stratification, comorbidities and participant's personal goals.       Expected Outcomes Achievement of increased cardiorespiratory fitness and enhanced flexibility, muscular endurance and strength shown through measurements of functional capacity and  personal statement of participant.       Increase Strength and Stamina Yes       Intervention Provide advice, education, support and counseling about physical activity/exercise needs.;Develop an  individualized exercise prescription for aerobic and resistive training based on initial evaluation findings, risk stratification, comorbidities and participant's personal goals.       Expected Outcomes Achievement of increased cardiorespiratory fitness and enhanced flexibility, muscular endurance and strength shown through measurements of functional capacity and personal statement of participant.       Able to understand and use rate of perceived exertion (RPE) scale Yes       Intervention Provide education and explanation on how to use RPE scale       Expected Outcomes Short Term: Able to use RPE daily in rehab to express subjective intensity level;Long Term:  Able to use RPE to guide intensity level when exercising independently       Knowledge and understanding of Target Heart Rate Range (THRR) Yes       Intervention Provide education and explanation of THRR including how the numbers were predicted and where they are located for reference       Expected Outcomes Short Term: Able to state/look up THRR;Short Term: Able to use daily as guideline for intensity in rehab;Long Term: Able to use THRR to govern intensity when exercising independently       Able to check pulse independently Yes       Intervention Provide education and demonstration on how to check pulse in carotid and radial arteries.;Review the importance of being able to check your own pulse for safety during independent exercise       Expected Outcomes Long Term: Able to check pulse independently and accurately;Short Term: Able to explain why pulse checking is important during independent exercise       Understanding of Exercise Prescription Yes       Intervention Provide education, explanation, and written materials on patient's individual  exercise prescription       Expected Outcomes Short Term: Able to explain program exercise prescription;Long Term: Able to explain home exercise prescription to exercise independently          Exercise Goals Re-Evaluation :    Discharge Exercise Prescription (Final Exercise Prescription Changes):   Nutrition:  Target Goals: Understanding of nutrition guidelines, daily intake of sodium 1500mg , cholesterol 200mg , calories 30% from fat and 7% or less from saturated fats, daily to have 5 or more servings of fruits and vegetables.  Biometrics:     Pre Biometrics - 06/09/17 0931      Pre Biometrics   Height 5\' 7"  (1.702 m)   Weight 167 lb 15.9 oz (76.2 kg)   Waist Circumference 39.5 inches   Hip Circumference 39 inches   Waist to Hip Ratio 1.01 %   BMI (Calculated) 26.3   Triceps Skinfold 20 mm   Grip Strength 33 kg   Flexibility 9.5 in   Single Leg Stand 5 seconds       Nutrition Therapy Plan and Nutrition Goals:   Nutrition Discharge: Nutrition Scores:   Nutrition Goals Re-Evaluation:   Nutrition Goals Re-Evaluation:   Nutrition Goals Discharge (Final Nutrition Goals Re-Evaluation):   Psychosocial: Target Goals: Acknowledge presence or absence of significant depression and/or stress, maximize coping skills, provide positive support system. Participant is able to verbalize types and ability to use techniques and skills needed for reducing stress and depression.  Initial Review & Psychosocial Screening:     Initial Psych Review & Screening - 06/09/17 1126      Initial Review   Current issues with None Identified     Family Dynamics   Good Support System? Yes  spouse, children   Comments upon brief assessment, no psychosocial needs identified, no inteventions necessary      Barriers   Psychosocial barriers to participate in program There are no identifiable barriers or psychosocial needs.     Screening Interventions   Interventions Encouraged to  exercise;Provide feedback about the scores to participant      Quality of Life Scores:     Quality of Life - 06/09/17 1102      Quality of Life Scores   Health/Function Pre 30 %   Socioeconomic Pre 30 %   Psych/Spiritual Pre 30 %   Family Pre 30 %   GLOBAL Pre 30 %      PHQ-9: Recent Review Flowsheet Data    There is no flowsheet data to display.     Interpretation of Total Score  Total Score Depression Severity:  1-4 = Minimal depression, 5-9 = Mild depression, 10-14 = Moderate depression, 15-19 = Moderately severe depression, 20-27 = Severe depression   Psychosocial Evaluation and Intervention:   Psychosocial Re-Evaluation:   Psychosocial Discharge (Final Psychosocial Re-Evaluation):   Vocational Rehabilitation: Provide vocational rehab assistance to qualifying candidates.   Vocational Rehab Evaluation & Intervention:     Vocational Rehab - 06/09/17 1126      Initial Vocational Rehab Evaluation & Intervention   Assessment shows need for Vocational Rehabilitation No  retired       Education: Education Goals: Education classes will be provided on a weekly basis, covering required topics. Participant will state understanding/return demonstration of topics presented.  Learning Barriers/Preferences:     Learning Barriers/Preferences - 06/09/17 0919      Learning Barriers/Preferences   Learning Barriers Sight   Learning Preferences Written Material      Education Topics: Count Your Pulse:  -Group instruction provided by verbal instruction, demonstration, patient participation and written materials to support subject.  Instructors address importance of being able to find your pulse and how to count your pulse when at home without a heart monitor.  Patients get hands on experience counting their pulse with staff help and individually.   Heart Attack, Angina, and Risk Factor Modification:  -Group instruction provided by verbal instruction, video, and  written materials to support subject.  Instructors address signs and symptoms of angina and heart attacks.    Also discuss risk factors for heart disease and how to make changes to improve heart health risk factors.   Functional Fitness:  -Group instruction provided by verbal instruction, demonstration, patient participation, and written materials to support subject.  Instructors address safety measures for doing things around the house.  Discuss how to get up and down off the floor, how to pick things up properly, how to safely get out of a chair without assistance, and balance training.   Meditation and Mindfulness:  -Group instruction provided by verbal instruction, patient participation, and written materials to support subject.  Instructor addresses importance of mindfulness and meditation practice to help reduce stress and improve awareness.  Instructor also leads participants through a meditation exercise.    Stretching for Flexibility and Mobility:  -Group instruction provided by verbal instruction, patient participation, and written materials to support subject.  Instructors lead participants through series of stretches that are designed to increase flexibility thus improving mobility.  These stretches are additional exercise for major muscle groups that are typically performed during regular warm up and cool down.   Hands Only CPR:  -Group verbal, video, and participation provides a basic overview of AHA guidelines for  community CPR. Role-play of emergencies allow participants the opportunity to practice calling for help and chest compression technique with discussion of AED use.   Hypertension: -Group verbal and written instruction that provides a basic overview of hypertension including the most recent diagnostic guidelines, risk factor reduction with self-care instructions and medication management.    Nutrition I class: Heart Healthy Eating:  -Group instruction provided by  PowerPoint slides, verbal discussion, and written materials to support subject matter. The instructor gives an explanation and review of the Therapeutic Lifestyle Changes diet recommendations, which includes a discussion on lipid goals, dietary fat, sodium, fiber, plant stanol/sterol esters, sugar, and the components of a well-balanced, healthy diet.   Nutrition II class: Lifestyle Skills:  -Group instruction provided by PowerPoint slides, verbal discussion, and written materials to support subject matter. The instructor gives an explanation and review of label reading, grocery shopping for heart health, heart healthy recipe modifications, and ways to make healthier choices when eating out.   Diabetes Question & Answer:  -Group instruction provided by PowerPoint slides, verbal discussion, and written materials to support subject matter. The instructor gives an explanation and review of diabetes co-morbidities, pre- and post-prandial blood glucose goals, pre-exercise blood glucose goals, signs, symptoms, and treatment of hypoglycemia and hyperglycemia, and foot care basics.   Diabetes Blitz:  -Group instruction provided by PowerPoint slides, verbal discussion, and written materials to support subject matter. The instructor gives an explanation and review of the physiology behind type 1 and type 2 diabetes, diabetes medications and rational behind using different medications, pre- and post-prandial blood glucose recommendations and Hemoglobin A1c goals, diabetes diet, and exercise including blood glucose guidelines for exercising safely.    Portion Distortion:  -Group instruction provided by PowerPoint slides, verbal discussion, written materials, and food models to support subject matter. The instructor gives an explanation of serving size versus portion size, changes in portions sizes over the last 20 years, and what consists of a serving from each food group.   Stress Management:  -Group  instruction provided by verbal instruction, video, and written materials to support subject matter.  Instructors review role of stress in heart disease and how to cope with stress positively.     Exercising on Your Own:  -Group instruction provided by verbal instruction, power point, and written materials to support subject.  Instructors discuss benefits of exercise, components of exercise, frequency and intensity of exercise, and end points for exercise.  Also discuss use of nitroglycerin and activating EMS.  Review options of places to exercise outside of rehab.  Review guidelines for sex with heart disease.   Cardiac Drugs I:  -Group instruction provided by verbal instruction and written materials to support subject.  Instructor reviews cardiac drug classes: antiplatelets, anticoagulants, beta blockers, and statins.  Instructor discusses reasons, side effects, and lifestyle considerations for each drug class.   Cardiac Drugs II:  -Group instruction provided by verbal instruction and written materials to support subject.  Instructor reviews cardiac drug classes: angiotensin converting enzyme inhibitors (ACE-I), angiotensin II receptor blockers (ARBs), nitrates, and calcium channel blockers.  Instructor discusses reasons, side effects, and lifestyle considerations for each drug class.   Anatomy and Physiology of the Circulatory System:  Group verbal and written instruction and models provide basic cardiac anatomy and physiology, with the coronary electrical and arterial systems. Review of: AMI, Angina, Valve disease, Heart Failure, Peripheral Artery Disease, Cardiac Arrhythmia, Pacemakers, and the ICD.   Other Education:  -Group or individual verbal, written, or video instructions  that support the educational goals of the cardiac rehab program.   Knowledge Questionnaire Score:     Knowledge Questionnaire Score - 06/09/17 1055      Knowledge Questionnaire Score   Pre Score 17/24       Core Components/Risk Factors/Patient Goals at Admission:     Personal Goals and Risk Factors at Admission - 06/09/17 0937      Core Components/Risk Factors/Patient Goals on Admission   Heart Failure Yes   Intervention Provide a combined exercise and nutrition program that is supplemented with education, support and counseling about heart failure. Directed toward relieving symptoms such as shortness of breath, decreased exercise tolerance, and extremity edema.   Expected Outcomes Improve functional capacity of life;Short term: Attendance in program 2-3 days a week with increased exercise capacity. Reported lower sodium intake. Reported increased fruit and vegetable intake. Reports medication compliance.;Short term: Daily weights obtained and reported for increase. Utilizing diuretic protocols set by physician.;Long term: Adoption of self-care skills and reduction of barriers for early signs and symptoms recognition and intervention leading to self-care maintenance.   Lipids Yes   Intervention Provide education and support for participant on nutrition & aerobic/resistive exercise along with prescribed medications to achieve LDL 70mg , HDL >40mg .   Expected Outcomes Short Term: Participant states understanding of desired cholesterol values and is compliant with medications prescribed. Participant is following exercise prescription and nutrition guidelines.;Long Term: Cholesterol controlled with medications as prescribed, with individualized exercise RX and with personalized nutrition plan. Value goals: LDL < 70mg , HDL > 40 mg.      Core Components/Risk Factors/Patient Goals Review:    Core Components/Risk Factors/Patient Goals at Discharge (Final Review):    ITP Comments:     ITP Comments    Row Name 06/09/17 0916           ITP Comments Medical Director, Dr. Fransico Him          Comments: Patient attended orientation from 0835to 1015 to review rules and guidelines for program.  Completed 6 minute walk test, Intitial ITP, and exercise prescription.  VSS. Telemetry-sinus rhythm.  Asymptomatic.

## 2017-06-09 NOTE — Progress Notes (Signed)
Luis [name] 71 y.o. male DOB: [birth year] MRN: [id number]      Nutrition Note  1. S/P CABG x 2   2. Chronic systolic congestive heart failure ([facility])    Past Medical History:  Diagnosis Date  . Chronic kidney disease    kidney stones; noted per H&P per Dr [name] 02/06/2015 chronic kidney disease stage II  . Diabetes mellitus without complication ([place])    per H&P Dr [name] noted to be prediabetic 02/06/2015  . Dyslipidemia   . History of hepatomegaly    with fatty liver discussed per H&P per Dr [name] 02/06/2015 secondary to abd ultrasound   . Sleep apnea    AHI 17 on sleep study 08/23/2013 pt states was given CPAP machine but sent it back - does not use   . Vitamin D deficiency    as noted per H&P per Dr [name] 02/06/2015   Meds reviewed.  HT: Ht Readings from Last 1 Encounters:  06/09/17 5\' 7"  (1.702 m)    WT: Wt Readings from Last 3 Encounters:  06/09/17 167 lb 15.9 oz (76.2 kg)  05/27/17 163 lb 3.2 oz (74 kg)  05/11/17 164 lb (74.4 kg)     BMI 26.3   Current tobacco use? No  Labs:  Lipid Panel  No results found for: CHOL, TRIG, HDL, CHOLHDL, VLDL, LDLCALC, LDLDIRECT  Lab Results  Component Value Date   HGBA1C 6.1 (H) 03/26/2017   CBG (last 3)  No results for input(s): GLUCAP in the last 72 hours.  Nutrition Note Spoke with pt. Nutrition plan and goals reviewed with pt. Pt is following Step 2 of the Therapeutic Lifestyle Changes diet. Pt is a lacto-vegetarian. Pt is a diabetic according to medical record. Pt denies being DM.  Last A1c indicates blood glucose well-controlled. Pt expressed understanding of the information reviewed. Pt aware of nutrition education classes offered and plans on attending nutrition classes.  Nutrition Diagnosis ? Food-and nutrition-related knowledge deficit related to lack of exposure to information as related to diagnosis of: ? CVD ? Pre-DM  Nutrition Intervention ? Pt's individual nutrition plan and goals reviewed with  pt.  Nutrition Goal(s):  ? Pt able to name foods that affect blood glucose   Plan:  Pt to attend nutrition classes ? Nutrition I ? Nutrition II ? Portion Distortion ? Diabetes Q & A Will provide client-centered nutrition education as part of interdisciplinary care.   Monitor and evaluate progress toward nutrition goal with team.  Derek Mound, M.Ed, RD, LDN, CDE 06/09/2017 1:53 PM

## 2017-06-09 NOTE — Progress Notes (Signed)
Cardiac Individual Treatment Plan  Patient Details  Name: Luis [name] MRN: [id number] Date of Birth: May 11, 1946 Referring Provider:     CARDIAC REHAB PHASE II ORIENTATION from 06/09/2017 in [place]  Referring Provider  Kela Millin MD      Initial Encounter Date:    CARDIAC REHAB PHASE II ORIENTATION from 06/09/2017 in [facility]  Date  06/09/17  Referring Provider  Kela Millin MD      Visit Diagnosis: S/P CABG x 2  Chronic systolic congestive heart failure ([place])  Patient's Home Medications on Admission:  Current Outpatient Prescriptions:  .  aspirin 81 MG EC tablet, Take 1 tablet (81 mg total) by mouth daily., Disp: , Rfl:  .  carvedilol (COREG) 3.125 MG tablet, Take 3.125 mg by mouth 3 (three) times daily., Disp: , Rfl:  .  clopidogrel (PLAVIX) 75 MG tablet, Take 1 tablet (75 mg total) by mouth daily., Disp: 90 tablet, Rfl: 1 .  Multiple Vitamin (MULTIVITAMIN) capsule, Take 1 capsule by mouth daily., Disp: , Rfl:  .  psyllium (METAMUCIL SMOOTH TEXTURE) 58.6 % powder, Take 1 packet by mouth daily after supper. (Patient taking differently: Take 1 packet by mouth daily as needed. ), Disp: 283 g, Rfl: 12 .  rosuvastatin (CRESTOR) 40 MG tablet, Take 40 mg by mouth daily., Disp: , Rfl: 4  Past Medical History: Past Medical History:  Diagnosis Date  . Chronic kidney disease    kidney stones; noted per H&P per Dr [name] 02/06/2015 chronic kidney disease stage II  . Diabetes mellitus without complication ([place])    per H&P Dr [name] noted to be prediabetic 02/06/2015  . Dyslipidemia   . History of hepatomegaly    with fatty liver discussed per H&P per Dr [name] 02/06/2015 secondary to abd ultrasound   . Sleep apnea    AHI 17 on sleep study 08/23/2013 pt states was given CPAP machine but sent it back - does not use   . Vitamin D deficiency    as noted per H&P per Dr [name] 02/06/2015    Tobacco  Use: History  Smoking Status  . Never Smoker  Smokeless Tobacco  . Never Used    Labs: Recent Review Flowsheet Data    Labs for ITP Cardiac and Pulmonary Rehab Latest Ref Rng & Units 04/06/2017 04/07/2017 04/07/2017 04/08/2017 04/09/2017   Hemoglobin A1c 4.8 - 5.6 % - - - - -   PHART 7.350 - 7.450 - - - - -   PCO2ART 32.0 - 48.0 mmHg - - - - -   HCO3 20.0 - 28.0 mmol/L - - - - -   TCO2 0 - 100 mmol/L - - - - -   ACIDBASEDEF 0.0 - 2.0 mmol/L - - - - -   O2SAT % 54.4 49.3 58.3 56.6 64.4      Capillary Blood Glucose: Lab Results  Component Value Date   GLUCAP 108 (H) 04/10/2017   GLUCAP 107 (H) 04/10/2017   GLUCAP 131 (H) 04/09/2017   GLUCAP 98 04/09/2017   GLUCAP 118 (H) 04/09/2017     Exercise Target Goals: Date: 06/09/17  Exercise Program Goal: Individual exercise prescription set with THRR, safety & activity barriers. Participant demonstrates ability to understand and report RPE using BORG scale, to self-measure pulse accurately, and to acknowledge the importance of the exercise prescription.  Exercise Prescription Goal: Starting with aerobic activity 30 plus minutes a day, 3 days per week for initial exercise prescription.  Provide home exercise prescription and guidelines that participant acknowledges understanding prior to discharge.  Activity Barriers & Risk Stratification:     Activity Barriers & Cardiac Risk Stratification - 06/09/17 0919      Activity Barriers & Cardiac Risk Stratification   Activity Barriers Deconditioning;Muscular Weakness;Assistive Device;Other (comment)   Comments B knee discomfort when climbing stairs or incline   Cardiac Risk Stratification High      6 Minute Walk:     6 Minute Walk    Row Name 06/09/17 1055         6 Minute Walk   Phase Initial     Distance 882 feet     Walk Time 6 minutes     # of Rest Breaks 0     MPH 1.67     METS 2.3     RPE 15     VO2 Peak 8.04     Symptoms Yes (comment)     Comments fatigue!      Resting HR 75 bpm     Resting BP 110/60     Resting Oxygen Saturation  98 %     Exercise Oxygen Saturation  during 6 min walk 98 %     Max Ex. HR 99 bpm     Max Ex. BP 140/80     2 Minute Post BP 100/54        Oxygen Initial Assessment:   Oxygen Re-Evaluation:   Oxygen Discharge (Final Oxygen Re-Evaluation):   Initial Exercise Prescription:     Initial Exercise Prescription - 06/09/17 1000      Date of Initial Exercise RX and Referring Provider   Date 06/09/17   Referring Provider Kela Millin MD     Recumbant Bike   Level 1.5  upright scifit   Minutes 10   METs 1.5     NuStep   Level 2   SPM 70   Minutes 10   METs 1.5     Track   Laps 6   Minutes 10   METs 2.03     Prescription Details   Frequency (times per week) 3   Duration Progress to 30 minutes of continuous aerobic without signs/symptoms of physical distress     Intensity   THRR 40-80% of Max Heartrate 60-120   Ratings of Perceived Exertion 11-13   Perceived Dyspnea 0-4     Progression   Progression Continue to progress workloads to maintain intensity without signs/symptoms of physical distress.     Resistance Training   Training Prescription Yes   Weight 2lbs   Reps 10-15      Perform Capillary Blood Glucose checks as needed.  Exercise Prescription Changes:   Exercise Comments:   Exercise Goals and Review:     Exercise Goals    Row Name 06/09/17 0920 06/09/17 1131           Exercise Goals   Increase Physical Activity Yes  -      Intervention Provide advice, education, support and counseling about physical activity/exercise needs.;Develop an individualized exercise prescription for aerobic and resistive training based on initial evaluation findings, risk stratification, comorbidities and participant's personal goals.  -      Expected Outcomes Achievement of increased cardiorespiratory fitness and enhanced flexibility, muscular endurance and strength shown through  measurements of functional capacity and personal statement of participant.  -      Increase Strength and Stamina Yes -  reduc fatigue! increase walking tolerance and be more active  Intervention Provide advice, education, support and counseling about physical activity/exercise needs.;Develop an individualized exercise prescription for aerobic and resistive training based on initial evaluation findings, risk stratification, comorbidities and participant's personal goals.  -      Expected Outcomes Achievement of increased cardiorespiratory fitness and enhanced flexibility, muscular endurance and strength shown through measurements of functional capacity and personal statement of participant.  -      Able to understand and use rate of perceived exertion (RPE) scale Yes  -      Intervention Provide education and explanation on how to use RPE scale  -      Expected Outcomes Short Term: Able to use RPE daily in rehab to express subjective intensity level;Long Term:  Able to use RPE to guide intensity level when exercising independently  -      Knowledge and understanding of Target Heart Rate Range (THRR) Yes  -      Intervention Provide education and explanation of THRR including how the numbers were predicted and where they are located for reference  -      Expected Outcomes Short Term: Able to state/look up THRR;Short Term: Able to use daily as guideline for intensity in rehab;Long Term: Able to use THRR to govern intensity when exercising independently  -      Able to check pulse independently Yes  -      Intervention Provide education and demonstration on how to check pulse in carotid and radial arteries.;Review the importance of being able to check your own pulse for safety during independent exercise  -      Expected Outcomes Long Term: Able to check pulse independently and accurately;Short Term: Able to explain why pulse checking is important during independent exercise  -      Understanding of  Exercise Prescription Yes  -      Intervention Provide education, explanation, and written materials on patient's individual exercise prescription  -      Expected Outcomes Short Term: Able to explain program exercise prescription;Long Term: Able to explain home exercise prescription to exercise independently  -         Exercise Goals Re-Evaluation :    Discharge Exercise Prescription (Final Exercise Prescription Changes):   Nutrition:  Target Goals: Understanding of nutrition guidelines, daily intake of sodium 1500mg , cholesterol 200mg , calories 30% from fat and 7% or less from saturated fats, daily to have 5 or more servings of fruits and vegetables.  Biometrics:     Pre Biometrics - 06/09/17 0931      Pre Biometrics   Height 5\' 7"  (1.702 m)   Weight 167 lb 15.9 oz (76.2 kg)   Waist Circumference 39.5 inches   Hip Circumference 39 inches   Waist to Hip Ratio 1.01 %   BMI (Calculated) 26.3   Triceps Skinfold 20 mm   Grip Strength 33 kg   Flexibility 9.5 in   Single Leg Stand 5 seconds       Nutrition Therapy Plan and Nutrition Goals:   Nutrition Discharge: Nutrition Scores:   Nutrition Goals Re-Evaluation:   Nutrition Goals Re-Evaluation:   Nutrition Goals Discharge (Final Nutrition Goals Re-Evaluation):   Psychosocial: Target Goals: Acknowledge presence or absence of significant depression and/or stress, maximize coping skills, provide positive support system. Participant is able to verbalize types and ability to use techniques and skills needed for reducing stress and depression.  Initial Review & Psychosocial Screening:     Initial Psych Review & Screening - 06/09/17 1126  Initial Review   Current issues with None Identified     Family Dynamics   Good Support System? Yes  spouse, children   Comments upon brief assessment, no psychosocial needs identified, no inteventions necessary      Barriers   Psychosocial barriers to participate in  program There are no identifiable barriers or psychosocial needs.     Screening Interventions   Interventions Encouraged to exercise;Provide feedback about the scores to participant      Quality of Life Scores:     Quality of Life - 06/09/17 1102      Quality of Life Scores   Health/Function Pre 30 %   Socioeconomic Pre 30 %   Psych/Spiritual Pre 30 %   Family Pre 30 %   GLOBAL Pre 30 %      PHQ-9: Recent Review Flowsheet Data    There is no flowsheet data to display.     Interpretation of Total Score  Total Score Depression Severity:  1-4 = Minimal depression, 5-9 = Mild depression, 10-14 = Moderate depression, 15-19 = Moderately severe depression, 20-27 = Severe depression   Psychosocial Evaluation and Intervention:   Psychosocial Re-Evaluation:   Psychosocial Discharge (Final Psychosocial Re-Evaluation):   Vocational Rehabilitation: Provide vocational rehab assistance to qualifying candidates.   Vocational Rehab Evaluation & Intervention:     Vocational Rehab - 06/09/17 1126      Initial Vocational Rehab Evaluation & Intervention   Assessment shows need for Vocational Rehabilitation No  retired       Education: Education Goals: Education classes will be provided on a weekly basis, covering required topics. Participant will state understanding/return demonstration of topics presented.  Learning Barriers/Preferences:     Learning Barriers/Preferences - 06/09/17 0919      Learning Barriers/Preferences   Learning Barriers Sight   Learning Preferences Written Material      Education Topics: Count Your Pulse:  -Group instruction provided by verbal instruction, demonstration, patient participation and written materials to support subject.  Instructors address importance of being able to find your pulse and how to count your pulse when at home without a heart monitor.  Patients get hands on experience counting their pulse with staff help and  individually.   Heart Attack, Angina, and Risk Factor Modification:  -Group instruction provided by verbal instruction, video, and written materials to support subject.  Instructors address signs and symptoms of angina and heart attacks.    Also discuss risk factors for heart disease and how to make changes to improve heart health risk factors.   Functional Fitness:  -Group instruction provided by verbal instruction, demonstration, patient participation, and written materials to support subject.  Instructors address safety measures for doing things around the house.  Discuss how to get up and down off the floor, how to pick things up properly, how to safely get out of a chair without assistance, and balance training.   Meditation and Mindfulness:  -Group instruction provided by verbal instruction, patient participation, and written materials to support subject.  Instructor addresses importance of mindfulness and meditation practice to help reduce stress and improve awareness.  Instructor also leads participants through a meditation exercise.    Stretching for Flexibility and Mobility:  -Group instruction provided by verbal instruction, patient participation, and written materials to support subject.  Instructors lead participants through series of stretches that are designed to increase flexibility thus improving mobility.  These stretches are additional exercise for major muscle groups that are typically performed during regular warm up  and cool down.   Hands Only CPR:  -Group verbal, video, and participation provides a basic overview of AHA guidelines for community CPR. Role-play of emergencies allow participants the opportunity to practice calling for help and chest compression technique with discussion of AED use.   Hypertension: -Group verbal and written instruction that provides a basic overview of hypertension including the most recent diagnostic guidelines, risk factor reduction with  self-care instructions and medication management.    Nutrition I class: Heart Healthy Eating:  -Group instruction provided by PowerPoint slides, verbal discussion, and written materials to support subject matter. The instructor gives an explanation and review of the Therapeutic Lifestyle Changes diet recommendations, which includes a discussion on lipid goals, dietary fat, sodium, fiber, plant stanol/sterol esters, sugar, and the components of a well-balanced, healthy diet.   Nutrition II class: Lifestyle Skills:  -Group instruction provided by PowerPoint slides, verbal discussion, and written materials to support subject matter. The instructor gives an explanation and review of label reading, grocery shopping for heart health, heart healthy recipe modifications, and ways to make healthier choices when eating out.   Diabetes Question & Answer:  -Group instruction provided by PowerPoint slides, verbal discussion, and written materials to support subject matter. The instructor gives an explanation and review of diabetes co-morbidities, pre- and post-prandial blood glucose goals, pre-exercise blood glucose goals, signs, symptoms, and treatment of hypoglycemia and hyperglycemia, and foot care basics.   Diabetes Blitz:  -Group instruction provided by PowerPoint slides, verbal discussion, and written materials to support subject matter. The instructor gives an explanation and review of the physiology behind type 1 and type 2 diabetes, diabetes medications and rational behind using different medications, pre- and post-prandial blood glucose recommendations and Hemoglobin A1c goals, diabetes diet, and exercise including blood glucose guidelines for exercising safely.    Portion Distortion:  -Group instruction provided by PowerPoint slides, verbal discussion, written materials, and food models to support subject matter. The instructor gives an explanation of serving size versus portion size, changes in  portions sizes over the last 20 years, and what consists of a serving from each food group.   Stress Management:  -Group instruction provided by verbal instruction, video, and written materials to support subject matter.  Instructors review role of stress in heart disease and how to cope with stress positively.     Exercising on Your Own:  -Group instruction provided by verbal instruction, power point, and written materials to support subject.  Instructors discuss benefits of exercise, components of exercise, frequency and intensity of exercise, and end points for exercise.  Also discuss use of nitroglycerin and activating EMS.  Review options of places to exercise outside of rehab.  Review guidelines for sex with heart disease.   Cardiac Drugs I:  -Group instruction provided by verbal instruction and written materials to support subject.  Instructor reviews cardiac drug classes: antiplatelets, anticoagulants, beta blockers, and statins.  Instructor discusses reasons, side effects, and lifestyle considerations for each drug class.   Cardiac Drugs II:  -Group instruction provided by verbal instruction and written materials to support subject.  Instructor reviews cardiac drug classes: angiotensin converting enzyme inhibitors (ACE-I), angiotensin II receptor blockers (ARBs), nitrates, and calcium channel blockers.  Instructor discusses reasons, side effects, and lifestyle considerations for each drug class.   Anatomy and Physiology of the Circulatory System:  Group verbal and written instruction and models provide basic cardiac anatomy and physiology, with the coronary electrical and arterial systems. Review of: AMI, Angina, Valve disease, Heart Failure,  Peripheral Artery Disease, Cardiac Arrhythmia, Pacemakers, and the ICD.   Other Education:  -Group or individual verbal, written, or video instructions that support the educational goals of the cardiac rehab program.   Knowledge Questionnaire  Score:     Knowledge Questionnaire Score - 06/09/17 1055      Knowledge Questionnaire Score   Pre Score 17/24      Core Components/Risk Factors/Patient Goals at Admission:     Personal Goals and Risk Factors at Admission - 06/09/17 0937      Core Components/Risk Factors/Patient Goals on Admission   Heart Failure Yes   Intervention Provide a combined exercise and nutrition program that is supplemented with education, support and counseling about heart failure. Directed toward relieving symptoms such as shortness of breath, decreased exercise tolerance, and extremity edema.   Expected Outcomes Improve functional capacity of life;Short term: Attendance in program 2-3 days a week with increased exercise capacity. Reported lower sodium intake. Reported increased fruit and vegetable intake. Reports medication compliance.;Short term: Daily weights obtained and reported for increase. Utilizing diuretic protocols set by physician.;Long term: Adoption of self-care skills and reduction of barriers for early signs and symptoms recognition and intervention leading to self-care maintenance.   Lipids Yes   Intervention Provide education and support for participant on nutrition & aerobic/resistive exercise along with prescribed medications to achieve LDL 70mg , HDL >40mg .   Expected Outcomes Short Term: Participant states understanding of desired cholesterol values and is compliant with medications prescribed. Participant is following exercise prescription and nutrition guidelines.;Long Term: Cholesterol controlled with medications as prescribed, with individualized exercise RX and with personalized nutrition plan. Value goals: LDL < 70mg , HDL > 40 mg.      Core Components/Risk Factors/Patient Goals Review:    Core Components/Risk Factors/Patient Goals at Discharge (Final Review):    ITP Comments:     ITP Comments    Row Name 06/09/17 0916           ITP Comments Medical Director, Dr. Fransico Him          Comments: Patient attended orientation from [phone] to 1015  to review rules and guidelines for program. Completed 6 minute walk test, Intitial ITP, and exercise prescription.  VSS. Telemetry-sinus rhythm, nonspecific ST changes .  Asymptomatic.

## 2017-06-15 ENCOUNTER — Encounter (HOSPITAL_COMMUNITY)
Admission: RE | Admit: 2017-06-15 | Discharge: 2017-06-15 | Disposition: A | Discharge status: Home / Self Care | Payer: Medicare Other | Source: Ambulatory Visit | Attending: Cardiology | Admitting: Cardiology

## 2017-06-15 ENCOUNTER — Encounter (HOSPITAL_COMMUNITY): Payer: Medicare Other

## 2017-06-15 DIAGNOSIS — I5022 Chronic systolic (congestive) heart failure: Secondary | ICD-10-CM

## 2017-06-15 DIAGNOSIS — Z951 Presence of aortocoronary bypass graft: Secondary | ICD-10-CM

## 2017-06-15 DIAGNOSIS — Z48812 Encounter for surgical aftercare following surgery on the circulatory system: Secondary | ICD-10-CM | POA: Diagnosis not present

## 2017-06-15 NOTE — Progress Notes (Signed)
Daily Session Note  Patient Details  Name: Luis [name] MRN: [id number] Date of Birth: 05-31-1946 Referring Provider:     CARDIAC REHAB PHASE II ORIENTATION from 06/09/2017 in [facility]  Referring Provider  Kela Millin MD      Encounter Date: 06/15/2017  Check In:     Session Check In - 06/15/17 1143      Check-In   Location [facility]   Staff Present Maurice Small, RN, BSN;Amber Fair, MS, ACSM RCEP, Exercise Physiologist;Maria Whitaker, RN, BSN;Joann Rion, RN, BSN   Supervising physician immediately available to respond to emergencies [facility] immediately available   Physician(s) Dr. Carolin Sicks    Medication changes reported     No   Fall or balance concerns reported    No   Tobacco Cessation No Change   Warm-up and Cool-down Performed as group-led instruction   Resistance Training Performed Yes   VAD Patient? No     Pain Assessment   Currently in Pain? No/denies      Capillary Blood Glucose: No results found for this or any previous visit (from the past 24 hour(s)).    History  Smoking Status  . Never Smoker  Smokeless Tobacco  . Never Used    Goals Met:  Exercise tolerated well Personal goals reviewed No report of cardiac concerns or symptoms  Goals Unmet:  Not Applicable  Comments:  Pt started cardiac rehab today.  Pt tolerated light exercise without difficulty. VSS, telemetry-Sr with no noted ectopy asymptomatic.  Medication list reconciled. Pt denies barriers to medication compliance.  PSYCHOSOCIAL ASSESSMENT:  PHQ-0.  Pt completed post assessment quality of life survey.  Pt scored the following:     Quality of Life - 06/09/17 1102      Quality of Life Scores   Health/Function Pre 30 %   Socioeconomic Pre 30 %   Psych/Spiritual Pre 30 %   Family Pre 30 %   GLOBAL Pre 30 %      Pt exhibits positive coping skills, hopeful outlook with supportive family. No psychosocial needs  identified at this time, no psychosocial interventions necessary.    Pt enjoys visiting with friends and family.  Pt desires to increase stamina and strength.  Pt would like to be less fatigued so he may return to walking in the mall.  Pt enjoys walking and would like to get back to walking.   Pt oriented to exercise equipment and routine.  Understanding verbalized. Maurice Small RN, BSN Cardiac and Pulmonary Rehab Nurse Navigator      Dr. Rush Farmer is Medical Director for Pulmonary Rehab at [facility].

## 2017-06-17 ENCOUNTER — Encounter (HOSPITAL_COMMUNITY)
Admission: RE | Admit: 2017-06-17 | Discharge: 2017-06-17 | Disposition: A | Discharge status: Home / Self Care | Payer: Medicare Other | Source: Ambulatory Visit | Attending: Cardiology | Admitting: Cardiology

## 2017-06-17 ENCOUNTER — Encounter (HOSPITAL_COMMUNITY): Payer: Medicare Other

## 2017-06-17 DIAGNOSIS — Z48812 Encounter for surgical aftercare following surgery on the circulatory system: Secondary | ICD-10-CM | POA: Diagnosis not present

## 2017-06-17 DIAGNOSIS — I5022 Chronic systolic (congestive) heart failure: Secondary | ICD-10-CM

## 2017-06-17 DIAGNOSIS — Z951 Presence of aortocoronary bypass graft: Secondary | ICD-10-CM

## 2017-06-19 ENCOUNTER — Encounter (HOSPITAL_COMMUNITY): Admission: RE | Admit: 2017-06-19 | Payer: Medicare Other | Source: Ambulatory Visit

## 2017-06-19 ENCOUNTER — Telehealth (HOSPITAL_COMMUNITY): Payer: Self-pay | Admitting: *Deleted

## 2017-06-19 ENCOUNTER — Encounter (HOSPITAL_COMMUNITY): Payer: Medicare Other

## 2017-06-22 ENCOUNTER — Encounter (HOSPITAL_COMMUNITY): Payer: Medicare Other

## 2017-06-22 ENCOUNTER — Encounter (HOSPITAL_COMMUNITY)
Admission: RE | Admit: 2017-06-22 | Discharge: 2017-06-22 | Disposition: A | Discharge status: Home / Self Care | Payer: Medicare Other | Source: Ambulatory Visit | Attending: Cardiology | Admitting: Cardiology

## 2017-06-22 DIAGNOSIS — I5022 Chronic systolic (congestive) heart failure: Secondary | ICD-10-CM

## 2017-06-22 DIAGNOSIS — Z951 Presence of aortocoronary bypass graft: Secondary | ICD-10-CM

## 2017-06-22 DIAGNOSIS — Z48812 Encounter for surgical aftercare following surgery on the circulatory system: Secondary | ICD-10-CM | POA: Diagnosis not present

## 2017-06-24 ENCOUNTER — Encounter (HOSPITAL_COMMUNITY)
Admission: RE | Admit: 2017-06-24 | Discharge: 2017-06-24 | Disposition: A | Discharge status: Home / Self Care | Payer: Medicare Other | Source: Ambulatory Visit | Attending: Cardiology | Admitting: Cardiology

## 2017-06-24 ENCOUNTER — Encounter (HOSPITAL_COMMUNITY): Payer: Medicare Other

## 2017-06-24 DIAGNOSIS — Z951 Presence of aortocoronary bypass graft: Secondary | ICD-10-CM

## 2017-06-24 DIAGNOSIS — I5022 Chronic systolic (congestive) heart failure: Secondary | ICD-10-CM

## 2017-06-24 DIAGNOSIS — Z48812 Encounter for surgical aftercare following surgery on the circulatory system: Secondary | ICD-10-CM | POA: Diagnosis not present

## 2017-06-26 ENCOUNTER — Encounter (HOSPITAL_COMMUNITY)
Admission: RE | Admit: 2017-06-26 | Discharge: 2017-06-26 | Disposition: A | Discharge status: Home / Self Care | Payer: Medicare Other | Source: Ambulatory Visit | Attending: Cardiology | Admitting: Cardiology

## 2017-06-26 ENCOUNTER — Encounter (HOSPITAL_COMMUNITY): Payer: Medicare Other

## 2017-06-26 DIAGNOSIS — I5022 Chronic systolic (congestive) heart failure: Secondary | ICD-10-CM

## 2017-06-26 DIAGNOSIS — Z951 Presence of aortocoronary bypass graft: Secondary | ICD-10-CM

## 2017-06-26 DIAGNOSIS — Z48812 Encounter for surgical aftercare following surgery on the circulatory system: Secondary | ICD-10-CM | POA: Diagnosis not present

## 2017-06-29 ENCOUNTER — Encounter (HOSPITAL_COMMUNITY): Payer: Medicare Other

## 2017-06-29 ENCOUNTER — Encounter (HOSPITAL_COMMUNITY)
Admission: RE | Admit: 2017-06-29 | Discharge: 2017-06-29 | Disposition: A | Discharge status: Home / Self Care | Payer: Medicare Other | Source: Ambulatory Visit | Attending: Cardiology | Admitting: Cardiology

## 2017-06-29 DIAGNOSIS — I5022 Chronic systolic (congestive) heart failure: Secondary | ICD-10-CM

## 2017-06-29 DIAGNOSIS — Z951 Presence of aortocoronary bypass graft: Secondary | ICD-10-CM | POA: Diagnosis present

## 2017-06-29 DIAGNOSIS — Z48812 Encounter for surgical aftercare following surgery on the circulatory system: Secondary | ICD-10-CM | POA: Diagnosis not present

## 2017-07-01 ENCOUNTER — Encounter (HOSPITAL_COMMUNITY)
Admission: RE | Admit: 2017-07-01 | Discharge: 2017-07-01 | Disposition: A | Discharge status: Home / Self Care | Payer: Medicare Other | Source: Ambulatory Visit | Attending: Cardiology | Admitting: Cardiology

## 2017-07-01 ENCOUNTER — Encounter (HOSPITAL_COMMUNITY): Payer: Medicare Other

## 2017-07-01 DIAGNOSIS — Z48812 Encounter for surgical aftercare following surgery on the circulatory system: Secondary | ICD-10-CM | POA: Diagnosis not present

## 2017-07-01 NOTE — Progress Notes (Signed)
Reviewed home exercise with pt today.  Pt plans to walk at mall or shopping center and use stationary bike for exercise, 3x/week in addition to coming to cardiac rehab.  Reviewed THR, pulse, RPE, sign and symptoms, and when to call 911 or MD.  Also discussed weather considerations and indoor options.  Pt voiced understanding.    Beryle Bagsby [employer]

## 2017-07-03 ENCOUNTER — Encounter (HOSPITAL_COMMUNITY): Payer: Medicare Other

## 2017-07-05 ENCOUNTER — Encounter (HOSPITAL_COMMUNITY): Payer: Self-pay

## 2017-07-05 NOTE — Progress Notes (Signed)
Cardiac Individual Treatment Plan  Patient Details  Name: Luis [name] MRN: [id number] Date of Birth: Sep 04, 1946 Referring Provider:     CARDIAC REHAB PHASE II ORIENTATION from 06/09/2017 in [facility]  Referring Provider  Kela Millin MD      Initial Encounter Date:    CARDIAC REHAB PHASE II ORIENTATION from 06/09/2017 in [facility]  Date  06/09/17  Referring Provider  Kela Millin MD      Visit Diagnosis: No diagnosis found.  Patient's Home Medications on Admission:  Current Outpatient Prescriptions:  .  aspirin 81 MG EC tablet, Take 1 tablet (81 mg total) by mouth daily., Disp: , Rfl:  .  carvedilol (COREG) 3.125 MG tablet, Take 3.125 mg by mouth 3 (three) times daily., Disp: , Rfl:  .  clopidogrel (PLAVIX) 75 MG tablet, Take 1 tablet (75 mg total) by mouth daily., Disp: 90 tablet, Rfl: 1 .  Multiple Vitamin (MULTIVITAMIN) capsule, Take 1 capsule by mouth daily., Disp: , Rfl:  .  psyllium (METAMUCIL SMOOTH TEXTURE) 58.6 % powder, Take 1 packet by mouth daily after supper. (Patient taking differently: Take 1 packet by mouth daily as needed. ), Disp: 283 g, Rfl: 12 .  rosuvastatin (CRESTOR) 40 MG tablet, Take 40 mg by mouth daily., Disp: , Rfl: 4  Past Medical History: Past Medical History:  Diagnosis Date  . Chronic kidney disease    kidney stones; noted per H&P per Dr [name] 02/06/2015 chronic kidney disease stage II  . Diabetes mellitus without complication ([place])    per H&P Dr [name] noted to be prediabetic 02/06/2015  . Dyslipidemia   . History of hepatomegaly    with fatty liver discussed per H&P per Dr [name] 02/06/2015 secondary to abd ultrasound   . Sleep apnea    AHI 17 on sleep study 08/23/2013 pt states was given CPAP machine but sent it back - does not use   . Vitamin D deficiency    as noted per H&P per Dr [name] 02/06/2015    Tobacco Use: History  Smoking Status  . Never Smoker   Smokeless Tobacco  . Never Used    Labs: Recent Review Flowsheet Data    Labs for ITP Cardiac and Pulmonary Rehab Latest Ref Rng & Units 04/06/2017 04/07/2017 04/07/2017 04/08/2017 04/09/2017   Hemoglobin A1c 4.8 - 5.6 % - - - - -   PHART 7.350 - 7.450 - - - - -   PCO2ART 32.0 - 48.0 mmHg - - - - -   HCO3 20.0 - 28.0 mmol/L - - - - -   TCO2 0 - 100 mmol/L - - - - -   ACIDBASEDEF 0.0 - 2.0 mmol/L - - - - -   O2SAT % 54.4 49.3 58.3 56.6 64.4      Capillary Blood Glucose: Lab Results  Component Value Date   GLUCAP 108 (H) 04/10/2017   GLUCAP 107 (H) 04/10/2017   GLUCAP 131 (H) 04/09/2017   GLUCAP 98 04/09/2017   GLUCAP 118 (H) 04/09/2017     Exercise Target Goals:    Exercise Program Goal: Individual exercise prescription set with THRR, safety & activity barriers. Participant demonstrates ability to understand and report RPE using BORG scale, to self-measure pulse accurately, and to acknowledge the importance of the exercise prescription.  Exercise Prescription Goal: Starting with aerobic activity 30 plus minutes a day, 3 days per week for initial exercise prescription. Provide home exercise prescription and guidelines that participant  acknowledges understanding prior to discharge.  Activity Barriers & Risk Stratification:     Activity Barriers & Cardiac Risk Stratification - 06/09/17 0919      Activity Barriers & Cardiac Risk Stratification   Activity Barriers Deconditioning;Muscular Weakness;Assistive Device;Other (comment)   Comments B knee discomfort when climbing stairs or incline   Cardiac Risk Stratification High      6 Minute Walk:     6 Minute Walk    Row Name 06/09/17 1055         6 Minute Walk   Phase Initial     Distance 882 feet     Walk Time 6 minutes     # of Rest Breaks 0     MPH 1.67     METS 2.3     RPE 15     VO2 Peak 8.04     Symptoms Yes (comment)     Comments fatigue!     Resting HR 75 bpm     Resting BP 110/60     Resting Oxygen  Saturation  98 %     Exercise Oxygen Saturation  during 6 min walk 98 %     Max Ex. HR 99 bpm     Max Ex. BP 140/80     2 Minute Post BP 100/54        Oxygen Initial Assessment:   Oxygen Re-Evaluation:   Oxygen Discharge (Final Oxygen Re-Evaluation):   Initial Exercise Prescription:     Initial Exercise Prescription - 06/09/17 1000      Date of Initial Exercise RX and Referring Provider   Date 06/09/17   Referring Provider Kela Millin MD     Recumbant Bike   Level 1.5  upright scifit   Minutes 10   METs 1.5     NuStep   Level 2   SPM 70   Minutes 10   METs 1.5     Track   Laps 6   Minutes 10   METs 2.03     Prescription Details   Frequency (times per week) 3   Duration Progress to 30 minutes of continuous aerobic without signs/symptoms of physical distress     Intensity   THRR 40-80% of Max Heartrate 60-120   Ratings of Perceived Exertion 11-13   Perceived Dyspnea 0-4     Progression   Progression Continue to progress workloads to maintain intensity without signs/symptoms of physical distress.     Resistance Training   Training Prescription Yes   Weight 2lbs   Reps 10-15      Perform Capillary Blood Glucose checks as needed.  Exercise Prescription Changes:      Exercise Prescription Changes    Row Name 06/15/17 0924 07/01/17 0900           Response to Exercise   Blood Pressure (Admit) (P)  106/64 (P)  99/66      Blood Pressure (Exercise) (P)  104/72 (P)  108/78      Blood Pressure (Exit) (P)  98/66 (P)  98/64      Heart Rate (Admit) (P)  82 bpm (P)  74 bpm      Heart Rate (Exercise) (P)  92 bpm (P)  111 bpm      Heart Rate (Exit) (P)  82 bpm (P)  74 bpm      Rating of Perceived Exertion (Exercise) (P)  12 (P)  12      Symptoms (P)  none (P)  none  Comments (P)  pt oriented to exercise equipment (P)  pt is responded well to exercise prescription      Duration (P)  Continue with 30 min of aerobic exercise without  signs/symptoms of physical distress. (P)  Continue with 30 min of aerobic exercise without signs/symptoms of physical distress.      Intensity (P)  THRR unchanged (P)  THRR unchanged        Progression   Progression (P)  Continue to progress workloads to maintain intensity without signs/symptoms of physical distress. (P)  Continue to progress workloads to maintain intensity without signs/symptoms of physical distress.      Average METs (P)  2.4 (P)  3.4        Resistance Training   Training Prescription (P)  Yes (P)  Yes      Weight (P)  2lbs (P)  5lbs      Reps (P)  10-15 (P)  10-15      Time (P)  10 Minutes (P)  10 Minutes        Recumbant Bike   Level (P)  1.5  upright scifit bike (P)  1.5  upright scifit bike      Minutes (P)  10 (P)  10      METs (P)  2.4 (P)  2.9        NuStep   Level (P)  2 (P)  2      SPM (P)  80 (P)  90      Minutes (P)  10 (P)  10      METs (P)  2.4 (P)  3.5        Track   Laps (P)  11 (P)  16      Minutes (P)  10 (P)  10      METs (P)  2.92 (P)  3.79         Exercise Comments:      Exercise Comments    Row Name 07/06/17 1639           Exercise Comments Reviewed METs and goals. Pt is tolerating exercise very well; will continue to monitor pt's progress and activity levels.           Exercise Goals and Review:      Exercise Goals    Row Name 06/09/17 0920 06/09/17 1131           Exercise Goals   Increase Physical Activity Yes  -      Intervention Provide advice, education, support and counseling about physical activity/exercise needs.;Develop an individualized exercise prescription for aerobic and resistive training based on initial evaluation findings, risk stratification, comorbidities and participant's personal goals.  -      Expected Outcomes Achievement of increased cardiorespiratory fitness and enhanced flexibility, muscular endurance and strength shown through measurements of functional capacity and personal statement of  participant.  -      Increase Strength and Stamina Yes -  reduc fatigue! increase walking tolerance and be more active      Intervention Provide advice, education, support and counseling about physical activity/exercise needs.;Develop an individualized exercise prescription for aerobic and resistive training based on initial evaluation findings, risk stratification, comorbidities and participant's personal goals.  -      Expected Outcomes Achievement of increased cardiorespiratory fitness and enhanced flexibility, muscular endurance and strength shown through measurements of functional capacity and personal statement of participant.  -      Able to understand and use rate of  perceived exertion (RPE) scale Yes  -      Intervention Provide education and explanation on how to use RPE scale  -      Expected Outcomes Short Term: Able to use RPE daily in rehab to express subjective intensity level;Long Term:  Able to use RPE to guide intensity level when exercising independently  -      Knowledge and understanding of Target Heart Rate Range (THRR) Yes  -      Intervention Provide education and explanation of THRR including how the numbers were predicted and where they are located for reference  -      Expected Outcomes Short Term: Able to state/look up THRR;Short Term: Able to use daily as guideline for intensity in rehab;Long Term: Able to use THRR to govern intensity when exercising independently  -      Able to check pulse independently Yes  -      Intervention Provide education and demonstration on how to check pulse in carotid and radial arteries.;Review the importance of being able to check your own pulse for safety during independent exercise  -      Expected Outcomes Long Term: Able to check pulse independently and accurately;Short Term: Able to explain why pulse checking is important during independent exercise  -      Understanding of Exercise Prescription Yes  -      Intervention Provide  education, explanation, and written materials on patient's individual exercise prescription  -      Expected Outcomes Short Term: Able to explain program exercise prescription;Long Term: Able to explain home exercise prescription to exercise independently  -         Exercise Goals Re-Evaluation :     Exercise Goals Re-Evaluation    Row Name 07/01/17 1227 07/01/17 1231           Exercise Goal Re-Evaluation   Exercise Goals Review Increase Physical Activity;Able to understand and use rate of perceived exertion (RPE) scale;Understanding of Exercise Prescription Increase Physical Activity;Able to understand and use rate of perceived exertion (RPE) scale;Knowledge and understanding of Target Heart Rate Range (THRR);Understanding of Exercise Prescription;Increase Strength and Stamina      Comments  - Reviewed home exercise with pt today.  Pt plans to walk at mall or shopping center and use stationary bike for exercise, 3x/week in addition to coming to cardiac rehab.  Reviewed THR, pulse, RPE, sign and symptoms, and when to call 911 or MD.  Also discussed weather considerations and indoor options.  Pt voiced understanding.      Expected Outcomes  - Pt will be compliant with HEP and improve in cardiorespiratory fitness.          Discharge Exercise Prescription (Final Exercise Prescription Changes):     Exercise Prescription Changes - 07/01/17 0900      Response to Exercise   Blood Pressure (Admit) (P)  99/66   Blood Pressure (Exercise) (P)  108/78   Blood Pressure (Exit) (P)  98/64   Heart Rate (Admit) (P)  74 bpm   Heart Rate (Exercise) (P)  111 bpm   Heart Rate (Exit) (P)  74 bpm   Rating of Perceived Exertion (Exercise) (P)  12   Symptoms (P)  none   Comments (P)  pt is responded well to exercise prescription   Duration (P)  Continue with 30 min of aerobic exercise without signs/symptoms of physical distress.   Intensity (P)  THRR unchanged     Progression  Progression (P)   Continue to progress workloads to maintain intensity without signs/symptoms of physical distress.   Average METs (P)  3.4     Resistance Training   Training Prescription (P)  Yes   Weight (P)  5lbs   Reps (P)  10-15   Time (P)  10 Minutes     Recumbant Bike   Level (P)  1.5  upright scifit bike   Minutes (P)  10   METs (P)  2.9     NuStep   Level (P)  2   SPM (P)  90   Minutes (P)  10   METs (P)  3.5     Track   Laps (P)  16   Minutes (P)  10   METs (P)  3.79      Nutrition:  Target Goals: Understanding of nutrition guidelines, daily intake of sodium 1500mg , cholesterol 200mg , calories 30% from fat and 7% or less from saturated fats, daily to have 5 or more servings of fruits and vegetables.  Biometrics:     Pre Biometrics - 06/09/17 0931      Pre Biometrics   Height 5\' 7"  (1.702 m)   Weight 167 lb 15.9 oz (76.2 kg)   Waist Circumference 39.5 inches   Hip Circumference 39 inches   Waist to Hip Ratio 1.01 %   BMI (Calculated) 26.3   Triceps Skinfold 20 mm   Grip Strength 33 kg   Flexibility 9.5 in   Single Leg Stand 5 seconds       Nutrition Therapy Plan and Nutrition Goals:     Nutrition Therapy & Goals - 06/09/17 1351      Nutrition Therapy   Diet Therapeutic Lifestyle Changes - Lacto vegetarian     Personal Nutrition Goals   Nutrition Goal Pt able to name foods that affect blood glucose      Intervention Plan   Intervention Prescribe, educate and counsel regarding individualized specific dietary modifications aiming towards targeted core components such as weight, hypertension, lipid management, diabetes, heart failure and other comorbidities.   Expected Outcomes Short Term Goal: Understand basic principles of dietary content, such as calories, fat, sodium, cholesterol and nutrients.;Long Term Goal: Adherence to prescribed nutrition plan.      Nutrition Discharge: Nutrition Scores:     Nutrition Assessments - 06/09/17 1353      MEDFICTS  Scores   Pre Score 28      Nutrition Goals Re-Evaluation:   Nutrition Goals Re-Evaluation:   Nutrition Goals Discharge (Final Nutrition Goals Re-Evaluation):   Psychosocial: Target Goals: Acknowledge presence or absence of significant depression and/or stress, maximize coping skills, provide positive support system. Participant is able to verbalize types and ability to use techniques and skills needed for reducing stress and depression.  Initial Review & Psychosocial Screening:     Initial Psych Review & Screening - 06/09/17 1126      Initial Review   Current issues with None Identified     Family Dynamics   Good Support System? Yes  spouse, children   Comments upon brief assessment, no psychosocial needs identified, no inteventions necessary      Barriers   Psychosocial barriers to participate in program There are no identifiable barriers or psychosocial needs.     Screening Interventions   Interventions Encouraged to exercise;Provide feedback about the scores to participant      Quality of Life Scores:     Quality of Life - 06/09/17 1102  Quality of Life Scores   Health/Function Pre 30 %   Socioeconomic Pre 30 %   Psych/Spiritual Pre 30 %   Family Pre 30 %   GLOBAL Pre 30 %      PHQ-9: Recent Review Flowsheet Data    There is no flowsheet data to display.     Interpretation of Total Score  Total Score Depression Severity:  1-4 = Minimal depression, 5-9 = Mild depression, 10-14 = Moderate depression, 15-19 = Moderately severe depression, 20-27 = Severe depression   Psychosocial Evaluation and Intervention:     Psychosocial Evaluation - 07/05/17 1936      Psychosocial Evaluation & Interventions   Interventions Encouraged to exercise with the program and follow exercise prescription   Comments Pt with supportive family, denies any pyschosocial needs at this time.   Expected Outcomes  will continue to display positive attitude with healthy coping  skills.  Pt will contribute to his family and the communty in a meaningful way.   Continue Psychosocial Services  No Follow up required      Psychosocial Re-Evaluation:     Psychosocial Re-Evaluation    [place] Name 06/15/17 1216 07/05/17 1934 07/05/17 1935         Psychosocial Re-Evaluation   Current issues with None Identified None Identified  -     Comments Pt with supportive family, denies any pyschosocial needs at this time. Pt with supportive family, denies any pyschosocial needs at this time. Pt with supportive family, denies any pyschosocial needs at this time.     Expected Outcomes  - Pt will continue to display positive attitude with healthy coping skills.  Pt will contribute to his family and the communty in a meaningful way.  -     Interventions Encouraged to attend Cardiac Rehabilitation for the exercise;Relaxation education;Stress management education Encouraged to attend Cardiac Rehabilitation for the exercise;Relaxation education;Stress management education  -     Continue Psychosocial Services  No Follow up required No Follow up required  -        Psychosocial Discharge (Final Psychosocial Re-Evaluation):     Psychosocial Re-Evaluation - 07/05/17 1935      Psychosocial Re-Evaluation   Comments Pt with supportive family, denies any pyschosocial needs at this time.      Vocational Rehabilitation: Provide vocational rehab assistance to qualifying candidates.   Vocational Rehab Evaluation & Intervention:     Vocational Rehab - 06/09/17 1126      Initial Vocational Rehab Evaluation & Intervention   Assessment shows need for Vocational Rehabilitation No  retired       Education: Education Goals: Education classes will be provided on a weekly basis, covering required topics. Participant will state understanding/return demonstration of topics presented.  Learning Barriers/Preferences:     Learning Barriers/Preferences - 06/09/17 0919      Learning  Barriers/Preferences   Learning Barriers Sight   Learning Preferences Written Material      Education Topics: Count Your Pulse:  -Group instruction provided by verbal instruction, demonstration, patient participation and written materials to support subject.  Instructors address importance of being able to find your pulse and how to count your pulse when at home without a heart monitor.  Patients get hands on experience counting their pulse with staff help and individually.   Heart Attack, Angina, and Risk Factor Modification:  -Group instruction provided by verbal instruction, video, and written materials to support subject.  Instructors address signs and symptoms of angina and heart attacks.  Also discuss risk factors for heart disease and how to make changes to improve heart health risk factors.   CARDIAC REHAB PHASE II EXERCISE from 06/26/2017 in [place]  Date  06/17/17  Instruction Review Code  2- meets goals/outcomes      Functional Fitness:  -Group instruction provided by verbal instruction, demonstration, patient participation, and written materials to support subject.  Instructors address safety measures for doing things around the house.  Discuss how to get up and down off the floor, how to pick things up properly, how to safely get out of a chair without assistance, and balance training.   Meditation and Mindfulness:  -Group instruction provided by verbal instruction, patient participation, and written materials to support subject.  Instructor addresses importance of mindfulness and meditation practice to help reduce stress and improve awareness.  Instructor also leads participants through a meditation exercise.    Stretching for Flexibility and Mobility:  -Group instruction provided by verbal instruction, patient participation, and written materials to support subject.  Instructors lead participants through series of stretches that are  designed to increase flexibility thus improving mobility.  These stretches are additional exercise for major muscle groups that are typically performed during regular warm up and cool down.   Hands Only CPR:  -Group verbal, video, and participation provides a basic overview of AHA guidelines for community CPR. Role-play of emergencies allow participants the opportunity to practice calling for help and chest compression technique with discussion of AED use.   Hypertension: -Group verbal and written instruction that provides a basic overview of hypertension including the most recent diagnostic guidelines, risk factor reduction with self-care instructions and medication management.   CARDIAC REHAB PHASE II EXERCISE from 06/26/2017 in [facility]  Date  06/26/17  Instruction Review Code  2- meets goals/outcomes       Nutrition I class: Heart Healthy Eating:  -Group instruction provided by PowerPoint slides, verbal discussion, and written materials to support subject matter. The instructor gives an explanation and review of the Therapeutic Lifestyle Changes diet recommendations, which includes a discussion on lipid goals, dietary fat, sodium, fiber, plant stanol/sterol esters, sugar, and the components of a well-balanced, healthy diet.   Nutrition II class: Lifestyle Skills:  -Group instruction provided by PowerPoint slides, verbal discussion, and written materials to support subject matter. The instructor gives an explanation and review of label reading, grocery shopping for heart health, heart healthy recipe modifications, and ways to make healthier choices when eating out.   Diabetes Question & Answer:  -Group instruction provided by PowerPoint slides, verbal discussion, and written materials to support subject matter. The instructor gives an explanation and review of diabetes co-morbidities, pre- and post-prandial blood glucose goals, pre-exercise blood glucose  goals, signs, symptoms, and treatment of hypoglycemia and hyperglycemia, and foot care basics.   Diabetes Blitz:  -Group instruction provided by PowerPoint slides, verbal discussion, and written materials to support subject matter. The instructor gives an explanation and review of the physiology behind type 1 and type 2 diabetes, diabetes medications and rational behind using different medications, pre- and post-prandial blood glucose recommendations and Hemoglobin A1c goals, diabetes diet, and exercise including blood glucose guidelines for exercising safely.    Portion Distortion:  -Group instruction provided by PowerPoint slides, verbal discussion, written materials, and food models to support subject matter. The instructor gives an explanation of serving size versus portion size, changes in portions sizes over the last 20 years, and what consists  of a serving from each food group.   Stress Management:  -Group instruction provided by verbal instruction, video, and written materials to support subject matter.  Instructors review role of stress in heart disease and how to cope with stress positively.     Exercising on Your Own:  -Group instruction provided by verbal instruction, power point, and written materials to support subject.  Instructors discuss benefits of exercise, components of exercise, frequency and intensity of exercise, and end points for exercise.  Also discuss use of nitroglycerin and activating EMS.  Review options of places to exercise outside of rehab.  Review guidelines for sex with heart disease.   Cardiac Drugs I:  -Group instruction provided by verbal instruction and written materials to support subject.  Instructor reviews cardiac drug classes: antiplatelets, anticoagulants, beta blockers, and statins.  Instructor discusses reasons, side effects, and lifestyle considerations for each drug class.   Cardiac Drugs II:  -Group instruction provided by verbal instruction  and written materials to support subject.  Instructor reviews cardiac drug classes: angiotensin converting enzyme inhibitors (ACE-I), angiotensin II receptor blockers (ARBs), nitrates, and calcium channel blockers.  Instructor discusses reasons, side effects, and lifestyle considerations for each drug class.   Anatomy and Physiology of the Circulatory System:  Group verbal and written instruction and models provide basic cardiac anatomy and physiology, with the coronary electrical and arterial systems. Review of: AMI, Angina, Valve disease, Heart Failure, Peripheral Artery Disease, Cardiac Arrhythmia, Pacemakers, and the ICD.   CARDIAC REHAB PHASE II EXERCISE from 06/26/2017 in [place]  Date  06/24/17  Instruction Review Code  2- meets goals/outcomes      Other Education:  -Group or individual verbal, written, or video instructions that support the educational goals of the cardiac rehab program.   Knowledge Questionnaire Score:     Knowledge Questionnaire Score - 06/09/17 1055      Knowledge Questionnaire Score   Pre Score 17/24      Core Components/Risk Factors/Patient Goals at Admission:     Personal Goals and Risk Factors at Admission - 06/09/17 0937      Core Components/Risk Factors/Patient Goals on Admission   Heart Failure Yes   Intervention Provide a combined exercise and nutrition program that is supplemented with education, support and counseling about heart failure. Directed toward relieving symptoms such as shortness of breath, decreased exercise tolerance, and extremity edema.   Expected Outcomes Improve functional capacity of life;Short term: Attendance in program 2-3 days a week with increased exercise capacity. Reported lower sodium intake. Reported increased fruit and vegetable intake. Reports medication compliance.;Short term: Daily weights obtained and reported for increase. Utilizing diuretic protocols set by physician.;Long term:  Adoption of self-care skills and reduction of barriers for early signs and symptoms recognition and intervention leading to self-care maintenance.   Lipids Yes   Intervention Provide education and support for participant on nutrition & aerobic/resistive exercise along with prescribed medications to achieve LDL 70mg , HDL >40mg .   Expected Outcomes Short Term: Participant states understanding of desired cholesterol values and is compliant with medications prescribed. Participant is following exercise prescription and nutrition guidelines.;Long Term: Cholesterol controlled with medications as prescribed, with individualized exercise RX and with personalized nutrition plan. Value goals: LDL < 70mg , HDL > 40 mg.      Core Components/Risk Factors/Patient Goals Review:      Goals and Risk Factor Review    Row Name 07/05/17 1932  Core Components/Risk Factors/Patient Goals Review   Personal Goals Review Weight Management/Obesity;Heart Failure;Lipids       Review Pt is off to a good start toward his personal goals.  Pt is consitent with attendance to exercise and education classes.       Expected Outcomes Pt will participate in Cardiac Rehab, nutrtion and lifestlye modifcation education to decrease overall risk factors.          Core Components/Risk Factors/Patient Goals at Discharge (Final Review):      Goals and Risk Factor Review - 07/05/17 1932      Core Components/Risk Factors/Patient Goals Review   Personal Goals Review Weight Management/Obesity;Heart Failure;Lipids   Review Pt is off to a good start toward his personal goals.  Pt is consitent with attendance to exercise and education classes.   Expected Outcomes Pt will participate in Cardiac Rehab, nutrtion and lifestlye modifcation education to decrease overall risk factors.      ITP Comments:     ITP Comments    Row Name 06/09/17 0916 07/07/17 1532         ITP Comments Medical Director, Dr. Fransico Him Medical  Director, Dr. Fransico Him         Comments:  Ihor Gully is making expected progress toward personal goals after completing 8 sessions. Psychosocial Assessment -Pt exhibits positive coping skills, hopeful outlook with supportive family. No psychosocial needs identified at this time, no psychosocial interventions necessary. Pt enjoys visiting with friends and family.  Pt desires to increase stamina and strength.  Pt would like to be less fatigued so he may return to walking in the mall.   Recommend continued exercise and life style modification education including  stress management and relaxation techniques to decrease cardiac risk profile. Cherre Huger, BSN Cardiac and Training and development officer

## 2017-07-06 ENCOUNTER — Encounter (HOSPITAL_COMMUNITY)
Admission: RE | Admit: 2017-07-06 | Discharge: 2017-07-06 | Disposition: A | Discharge status: Home / Self Care | Payer: Medicare Other | Source: Ambulatory Visit | Attending: Cardiology | Admitting: Cardiology

## 2017-07-06 ENCOUNTER — Encounter (HOSPITAL_COMMUNITY): Payer: Medicare Other

## 2017-07-06 DIAGNOSIS — Z48812 Encounter for surgical aftercare following surgery on the circulatory system: Secondary | ICD-10-CM | POA: Diagnosis not present

## 2017-07-06 DIAGNOSIS — Z951 Presence of aortocoronary bypass graft: Secondary | ICD-10-CM

## 2017-07-06 DIAGNOSIS — I5022 Chronic systolic (congestive) heart failure: Secondary | ICD-10-CM

## 2017-07-08 ENCOUNTER — Encounter (HOSPITAL_COMMUNITY): Payer: Medicare Other

## 2017-07-08 ENCOUNTER — Encounter (HOSPITAL_COMMUNITY)
Admission: RE | Admit: 2017-07-08 | Discharge: 2017-07-08 | Disposition: A | Discharge status: Home / Self Care | Payer: Medicare Other | Source: Ambulatory Visit | Attending: Cardiology | Admitting: Cardiology

## 2017-07-08 DIAGNOSIS — Z951 Presence of aortocoronary bypass graft: Secondary | ICD-10-CM

## 2017-07-08 DIAGNOSIS — I5022 Chronic systolic (congestive) heart failure: Secondary | ICD-10-CM

## 2017-07-08 DIAGNOSIS — Z48812 Encounter for surgical aftercare following surgery on the circulatory system: Secondary | ICD-10-CM | POA: Diagnosis not present

## 2017-07-10 ENCOUNTER — Telehealth (HOSPITAL_COMMUNITY): Payer: Self-pay | Admitting: *Deleted

## 2017-07-10 ENCOUNTER — Encounter (HOSPITAL_COMMUNITY): Payer: Medicare Other

## 2017-07-10 ENCOUNTER — Encounter (HOSPITAL_COMMUNITY): Admission: RE | Admit: 2017-07-10 | Payer: Medicare Other | Source: Ambulatory Visit

## 2017-07-13 ENCOUNTER — Encounter (HOSPITAL_COMMUNITY)
Admission: RE | Admit: 2017-07-13 | Discharge: 2017-07-13 | Disposition: A | Discharge status: Home / Self Care | Payer: Medicare Other | Source: Ambulatory Visit | Attending: Cardiology | Admitting: Cardiology

## 2017-07-13 ENCOUNTER — Encounter (HOSPITAL_COMMUNITY): Payer: Medicare Other

## 2017-07-13 DIAGNOSIS — I5022 Chronic systolic (congestive) heart failure: Secondary | ICD-10-CM

## 2017-07-13 DIAGNOSIS — Z48812 Encounter for surgical aftercare following surgery on the circulatory system: Secondary | ICD-10-CM | POA: Diagnosis not present

## 2017-07-13 DIAGNOSIS — Z951 Presence of aortocoronary bypass graft: Secondary | ICD-10-CM

## 2017-07-13 NOTE — Progress Notes (Signed)
Luis [name] 71 y.o. male DOB: [birth year] MRN: [id number]      Nutrition Note  1. S/P CABG x 2   2. Chronic systolic congestive heart failure (HCC)    Labs:   Lab Results  Component Value Date   HGBA1C 6.1 (H) 03/26/2017   Note Spoke with pt. Nutrition plan and survey reviewed with pt. Pt is following Step 2 of the Therapeutic Lifestyle Changes diet. Pt is a lacto-vegetarian. Pt is a diabetic according to EMR, which pt denies.  Last A1c indicates blood glucose well-controlled. Pre-diabetes/Diabetes discussed.  Pt expressed understanding of the information reviewed. Pt aware of nutrition education classes offered.  Nutrition Diagnosis ? Food-and nutrition-related knowledge deficit related to lack of exposure to information as related to diagnosis of: ? CVD ? Pre-DM  Nutrition Intervention ? Pt's individual nutrition plan and goals reviewed with pt. ? Benefits of adopting Therapeutic Lifestyle Changes discussed when [employer] reviewed.   ? Pt given handouts for: ? Nutrition I class ? Nutrition II class  Nutrition Goal(s):  ? Pt able to name foods that affect blood glucose   Plan:  Pt to attend nutrition classes ? Nutrition I ? Nutrition II ? Portion Distortion ? Diabetes Q & A Will provide client-centered nutrition education as part of interdisciplinary care.   Monitor and evaluate progress toward nutrition goal with team.  Derek Mound, M.Ed, RD, LDN, CDE 07/13/2017 12:02 PM

## 2017-07-15 ENCOUNTER — Encounter (HOSPITAL_COMMUNITY)
Admission: RE | Admit: 2017-07-15 | Discharge: 2017-07-15 | Disposition: A | Discharge status: Home / Self Care | Payer: Medicare Other | Source: Ambulatory Visit | Attending: Cardiology | Admitting: Cardiology

## 2017-07-15 ENCOUNTER — Encounter (HOSPITAL_COMMUNITY): Payer: Medicare Other

## 2017-07-15 DIAGNOSIS — Z951 Presence of aortocoronary bypass graft: Secondary | ICD-10-CM

## 2017-07-15 DIAGNOSIS — Z48812 Encounter for surgical aftercare following surgery on the circulatory system: Secondary | ICD-10-CM | POA: Diagnosis not present

## 2017-07-15 DIAGNOSIS — I5022 Chronic systolic (congestive) heart failure: Secondary | ICD-10-CM

## 2017-07-17 ENCOUNTER — Encounter (HOSPITAL_COMMUNITY)
Admission: RE | Admit: 2017-07-17 | Discharge: 2017-07-17 | Disposition: A | Discharge status: Home / Self Care | Payer: Medicare Other | Source: Ambulatory Visit | Attending: Cardiology | Admitting: Cardiology

## 2017-07-17 ENCOUNTER — Encounter (HOSPITAL_COMMUNITY): Payer: Medicare Other

## 2017-07-17 DIAGNOSIS — I5022 Chronic systolic (congestive) heart failure: Secondary | ICD-10-CM

## 2017-07-17 DIAGNOSIS — Z951 Presence of aortocoronary bypass graft: Secondary | ICD-10-CM

## 2017-07-17 DIAGNOSIS — Z48812 Encounter for surgical aftercare following surgery on the circulatory system: Secondary | ICD-10-CM | POA: Diagnosis not present

## 2017-07-20 ENCOUNTER — Encounter (HOSPITAL_COMMUNITY): Payer: Medicare Other

## 2017-07-20 ENCOUNTER — Encounter (HOSPITAL_COMMUNITY)
Admission: RE | Admit: 2017-07-20 | Discharge: 2017-07-20 | Disposition: A | Discharge status: Home / Self Care | Payer: Medicare Other | Source: Ambulatory Visit | Attending: Cardiology | Admitting: Cardiology

## 2017-07-20 DIAGNOSIS — Z48812 Encounter for surgical aftercare following surgery on the circulatory system: Secondary | ICD-10-CM | POA: Diagnosis not present

## 2017-07-20 DIAGNOSIS — Z951 Presence of aortocoronary bypass graft: Secondary | ICD-10-CM

## 2017-07-20 DIAGNOSIS — I5022 Chronic systolic (congestive) heart failure: Secondary | ICD-10-CM

## 2017-07-20 NOTE — Addendum Note (Signed)
Encounter addended by: Dorna Bloom D on: 07/20/2017  4:40 PM<BR>    Actions taken: Flowsheet accepted

## 2017-07-22 ENCOUNTER — Encounter (HOSPITAL_COMMUNITY)
Admission: RE | Admit: 2017-07-22 | Discharge: 2017-07-22 | Disposition: A | Discharge status: Home / Self Care | Payer: Medicare Other | Source: Ambulatory Visit | Attending: Cardiology | Admitting: Cardiology

## 2017-07-22 ENCOUNTER — Encounter (HOSPITAL_COMMUNITY): Payer: Medicare Other

## 2017-07-22 DIAGNOSIS — I5022 Chronic systolic (congestive) heart failure: Secondary | ICD-10-CM

## 2017-07-22 DIAGNOSIS — Z48812 Encounter for surgical aftercare following surgery on the circulatory system: Secondary | ICD-10-CM | POA: Diagnosis not present

## 2017-07-22 DIAGNOSIS — Z951 Presence of aortocoronary bypass graft: Secondary | ICD-10-CM

## 2017-07-22 NOTE — Progress Notes (Signed)
Pt in today for exercise. Pt  Complains of pain upon inspiration and chest tenderness to touch.  Pt bp 110/60, pulse ox 99 hr 80 SR, lungs clear bilat. Weight stablePt wife was seen in Dr. [name] office and during the visit, pt wife complained that Hiral felt tired all the time and weak.  Dr. [name] d/c his cholesterol medication and  beta blocker.  Called and spoke to Dr. [name].  Updated with pt symptoms.  Dr. [name] felt his symptoms were musculoskeletal in nature and not cardiac.  Pt ok to proceed with exercise and to take some tylenol when he got home. Pt able to exercise with frequent rest breaks for the first two stations.  Pt did not feel up to exercising during the third station. Cherre Huger, BSN Cardiac and Training and development officer

## 2017-07-24 ENCOUNTER — Encounter (HOSPITAL_COMMUNITY): Payer: Medicare Other

## 2017-07-24 ENCOUNTER — Encounter (HOSPITAL_COMMUNITY): Admission: RE | Admit: 2017-07-24 | Payer: Medicare Other | Source: Ambulatory Visit

## 2017-07-27 ENCOUNTER — Encounter (HOSPITAL_COMMUNITY): Payer: Medicare Other

## 2017-07-27 ENCOUNTER — Encounter (HOSPITAL_COMMUNITY)
Admission: RE | Admit: 2017-07-27 | Discharge: 2017-07-27 | Disposition: A | Discharge status: Home / Self Care | Payer: Medicare Other | Source: Ambulatory Visit | Attending: Cardiology | Admitting: Cardiology

## 2017-07-27 DIAGNOSIS — Z951 Presence of aortocoronary bypass graft: Secondary | ICD-10-CM

## 2017-07-27 DIAGNOSIS — Z48812 Encounter for surgical aftercare following surgery on the circulatory system: Secondary | ICD-10-CM | POA: Diagnosis not present

## 2017-07-27 DIAGNOSIS — I5022 Chronic systolic (congestive) heart failure: Secondary | ICD-10-CM

## 2017-07-28 ENCOUNTER — Encounter (HOSPITAL_COMMUNITY): Payer: Self-pay

## 2017-07-28 NOTE — Progress Notes (Signed)
Cardiac Individual Treatment Plan  Patient Details  Name: Luis [name] MRN: [id number] Date of Birth: 03/10/1946 Referring Provider:     CARDIAC REHAB PHASE II ORIENTATION from 06/09/2017 in [facility]  Referring Provider  Kela Millin MD      Initial Encounter Date:    CARDIAC REHAB PHASE II ORIENTATION from 06/09/2017 in [place]  Date  06/09/17  Referring Provider  Kela Millin MD      Visit Diagnosis: Chronic systolic congestive heart failure ([place])  S/P CABG x 2  Patient's Home Medications on Admission:  Current Outpatient Prescriptions:  .  aspirin 81 MG EC tablet, Take 1 tablet (81 mg total) by mouth daily., Disp: , Rfl:  .  carvedilol (COREG) 3.125 MG tablet, Take 3.125 mg by mouth 3 (three) times daily., Disp: , Rfl:  .  clopidogrel (PLAVIX) 75 MG tablet, Take 1 tablet (75 mg total) by mouth daily., Disp: 90 tablet, Rfl: 1 .  Multiple Vitamin (MULTIVITAMIN) capsule, Take 1 capsule by mouth daily., Disp: , Rfl:  .  psyllium (METAMUCIL SMOOTH TEXTURE) 58.6 % powder, Take 1 packet by mouth daily after supper. (Patient taking differently: Take 1 packet by mouth daily as needed. ), Disp: 283 g, Rfl: 12 .  rosuvastatin (CRESTOR) 40 MG tablet, Take 40 mg by mouth daily., Disp: , Rfl: 4  Past Medical History: Past Medical History:  Diagnosis Date  . Chronic kidney disease    kidney stones; noted per H&P per Dr [name] 02/06/2015 chronic kidney disease stage II  . Diabetes mellitus without complication ([place])    per H&P Dr [name] noted to be prediabetic 02/06/2015  . Dyslipidemia   . History of hepatomegaly    with fatty liver discussed per H&P per Dr [name] 02/06/2015 secondary to abd ultrasound   . Sleep apnea    AHI 17 on sleep study 08/23/2013 pt states was given CPAP machine but sent it back - does not use   . Vitamin D deficiency    as noted per H&P per Dr [name] 02/06/2015    Tobacco  Use: History  Smoking Status  . Never Smoker  Smokeless Tobacco  . Never Used    Labs: Recent Review Flowsheet Data    Labs for ITP Cardiac and Pulmonary Rehab Latest Ref Rng & Units 04/06/2017 04/07/2017 04/07/2017 04/08/2017 04/09/2017   Hemoglobin A1c 4.8 - 5.6 % - - - - -   PHART 7.350 - 7.450 - - - - -   PCO2ART 32.0 - 48.0 mmHg - - - - -   HCO3 20.0 - 28.0 mmol/L - - - - -   TCO2 0 - 100 mmol/L - - - - -   ACIDBASEDEF 0.0 - 2.0 mmol/L - - - - -   O2SAT % 54.4 49.3 58.3 56.6 64.4      Capillary Blood Glucose: Lab Results  Component Value Date   GLUCAP 108 (H) 04/10/2017   GLUCAP 107 (H) 04/10/2017   GLUCAP 131 (H) 04/09/2017   GLUCAP 98 04/09/2017   GLUCAP 118 (H) 04/09/2017     Exercise Target Goals:    Exercise Program Goal: Individual exercise prescription set with THRR, safety & activity barriers. Participant demonstrates ability to understand and report RPE using BORG scale, to self-measure pulse accurately, and to acknowledge the importance of the exercise prescription.  Exercise Prescription Goal: Starting with aerobic activity 30 plus minutes a day, 3 days per week for initial exercise prescription.  Provide home exercise prescription and guidelines that participant acknowledges understanding prior to discharge.  Activity Barriers & Risk Stratification:     Activity Barriers & Cardiac Risk Stratification - 06/09/17 0919      Activity Barriers & Cardiac Risk Stratification   Activity Barriers Deconditioning;Muscular Weakness;Assistive Device;Other (comment)   Comments B knee discomfort when climbing stairs or incline   Cardiac Risk Stratification High      6 Minute Walk:     6 Minute Walk    Row Name 06/09/17 1055         6 Minute Walk   Phase Initial     Distance 882 feet     Walk Time 6 minutes     # of Rest Breaks 0     MPH 1.67     METS 2.3     RPE 15     VO2 Peak 8.04     Symptoms Yes (comment)     Comments fatigue!     Resting HR 75  bpm     Resting BP 110/60     Resting Oxygen Saturation  98 %     Exercise Oxygen Saturation  during 6 min walk 98 %     Max Ex. HR 99 bpm     Max Ex. BP 140/80     2 Minute Post BP 100/54        Oxygen Initial Assessment:   Oxygen Re-Evaluation:   Oxygen Discharge (Final Oxygen Re-Evaluation):   Initial Exercise Prescription:     Initial Exercise Prescription - 06/09/17 1000      Date of Initial Exercise RX and Referring Provider   Date 06/09/17   Referring Provider Kela Millin MD     Recumbant Bike   Level 1.5  upright scifit   Minutes 10   METs 1.5     NuStep   Level 2   SPM 70   Minutes 10   METs 1.5     Track   Laps 6   Minutes 10   METs 2.03     Prescription Details   Frequency (times per week) 3   Duration Progress to 30 minutes of continuous aerobic without signs/symptoms of physical distress     Intensity   THRR 40-80% of Max Heartrate 60-120   Ratings of Perceived Exertion 11-13   Perceived Dyspnea 0-4     Progression   Progression Continue to progress workloads to maintain intensity without signs/symptoms of physical distress.     Resistance Training   Training Prescription Yes   Weight 2lbs   Reps 10-15      Perform Capillary Blood Glucose checks as needed.  Exercise Prescription Changes:      Exercise Prescription Changes    Row Name 06/15/17 0924 07/01/17 0900 07/20/17 1600         Response to Exercise   Blood Pressure (Admit) 106/64 99/66 102/68     Blood Pressure (Exercise) 104/72 108/78 112/71     Blood Pressure (Exit) 98/66 98/64 95/68      Heart Rate (Admit) 82 bpm 74 bpm 75 bpm     Heart Rate (Exercise) 92 bpm 111 bpm 107 bpm     Heart Rate (Exit) 82 bpm 74 bpm 82 bpm     Rating of Perceived Exertion (Exercise) 12 12 12      Symptoms none none none     Comments pt oriented to exercise equipment pt is responded well to exercise prescription  -  Duration Continue with 30 min of aerobic exercise without  signs/symptoms of physical distress. Continue with 30 min of aerobic exercise without signs/symptoms of physical distress. Continue with 30 min of aerobic exercise without signs/symptoms of physical distress.     Intensity THRR unchanged THRR unchanged THRR unchanged       Progression   Progression Continue to progress workloads to maintain intensity without signs/symptoms of physical distress. Continue to progress workloads to maintain intensity without signs/symptoms of physical distress. Continue to progress workloads to maintain intensity without signs/symptoms of physical distress.     Average METs 2.4 3.4 4       Resistance Training   Training Prescription Yes Yes Yes     Weight 2lbs 5lbs 5lbs     Reps 10-15 10-15 10-15     Time 10 Minutes 10 Minutes 10 Minutes       Recumbant Bike   Level 1.5  upright scifit bike 1.5  upright scifit bike 3  upright scifit bike     Minutes 10 10 10      METs 2.4 2.9 4.8       NuStep   Level 2 2 4      SPM 80 90 90     Minutes 10 10 10      METs 2.4 3.5 3.8       Track   Laps 11 16 14      Minutes 10 10 10      METs 2.92 3.79 3.43       Home Exercise Plan   Plans to continue exercise at  - Home (comment)  walking  Home (comment)  walking     Frequency  - Add 2 additional days to program exercise sessions. Add 2 additional days to program exercise sessions.     Initial Home Exercises Provided  - 07/01/17 07/01/17        Exercise Comments:      Exercise Comments    Row Name 07/06/17 1639 07/28/17 1647         Exercise Comments Reviewed METs and goals. Pt is tolerating exercise very well; will continue to monitor pt's progress and activity levels.  Reviewed METs and goals. Pt is tolerating exercise very well; will continue to monitor pt's progress and activity levels.          Exercise Goals and Review:      Exercise Goals    Row Name 06/09/17 0920 06/09/17 1131           Exercise Goals   Increase Physical Activity Yes  -       Intervention Provide advice, education, support and counseling about physical activity/exercise needs.;Develop an individualized exercise prescription for aerobic and resistive training based on initial evaluation findings, risk stratification, comorbidities and participant's personal goals.  -      Expected Outcomes Achievement of increased cardiorespiratory fitness and enhanced flexibility, muscular endurance and strength shown through measurements of functional capacity and personal statement of participant.  -      Increase Strength and Stamina Yes -  reduc fatigue! increase walking tolerance and be more active      Intervention Provide advice, education, support and counseling about physical activity/exercise needs.;Develop an individualized exercise prescription for aerobic and resistive training based on initial evaluation findings, risk stratification, comorbidities and participant's personal goals.  -      Expected Outcomes Achievement of increased cardiorespiratory fitness and enhanced flexibility, muscular endurance and strength shown through measurements of functional capacity and personal statement of  participant.  -      Able to understand and use rate of perceived exertion (RPE) scale Yes  -      Intervention Provide education and explanation on how to use RPE scale  -      Expected Outcomes Short Term: Able to use RPE daily in rehab to express subjective intensity level;Long Term:  Able to use RPE to guide intensity level when exercising independently  -      Knowledge and understanding of Target Heart Rate Range (THRR) Yes  -      Intervention Provide education and explanation of THRR including how the numbers were predicted and where they are located for reference  -      Expected Outcomes Short Term: Able to state/look up THRR;Short Term: Able to use daily as guideline for intensity in rehab;Long Term: Able to use THRR to govern intensity when exercising independently  -       Able to check pulse independently Yes  -      Intervention Provide education and demonstration on how to check pulse in carotid and radial arteries.;Review the importance of being able to check your own pulse for safety during independent exercise  -      Expected Outcomes Long Term: Able to check pulse independently and accurately;Short Term: Able to explain why pulse checking is important during independent exercise  -      Understanding of Exercise Prescription Yes  -      Intervention Provide education, explanation, and written materials on patient's individual exercise prescription  -      Expected Outcomes Short Term: Able to explain program exercise prescription;Long Term: Able to explain home exercise prescription to exercise independently  -         Exercise Goals Re-Evaluation :     Exercise Goals Re-Evaluation    Row Name 07/01/17 1227 07/01/17 1231 07/28/17 1648         Exercise Goal Re-Evaluation   Exercise Goals Review Increase Physical Activity;Able to understand and use rate of perceived exertion (RPE) scale;Understanding of Exercise Prescription Increase Physical Activity;Able to understand and use rate of perceived exertion (RPE) scale;Knowledge and understanding of Target Heart Rate Range (THRR);Understanding of Exercise Prescription;Increase Strength and Stamina Increase Physical Activity;Able to understand and use rate of perceived exertion (RPE) scale;Knowledge and understanding of Target Heart Rate Range (THRR);Understanding of Exercise Prescription;Increase Strength and Stamina     Comments  - Reviewed home exercise with pt today.  Pt plans to walk at mall or shopping center and use stationary bike for exercise, 3x/week in addition to coming to cardiac rehab.  Reviewed THR, pulse, RPE, sign and symptoms, and when to call 911 or MD.  Also discussed weather considerations and indoor options.  Pt voiced understanding. Pt is making great progress in cardiac rehab. Pt is  currently averaging 5.6 METs on the Nustep machine at a load of 4.     Expected Outcomes  - Pt will be compliant with HEP and improve in cardiorespiratory fitness. Pt will continue to improve in cardiorespiratory fitness         Discharge Exercise Prescription (Final Exercise Prescription Changes):     Exercise Prescription Changes - 07/20/17 1600      Response to Exercise   Blood Pressure (Admit) 102/68   Blood Pressure (Exercise) 112/71   Blood Pressure (Exit) 95/68   Heart Rate (Admit) 75 bpm   Heart Rate (Exercise) 107 bpm   Heart Rate (Exit) 82 bpm  Rating of Perceived Exertion (Exercise) 12   Symptoms none   Duration Continue with 30 min of aerobic exercise without signs/symptoms of physical distress.   Intensity THRR unchanged     Progression   Progression Continue to progress workloads to maintain intensity without signs/symptoms of physical distress.   Average METs 4     Resistance Training   Training Prescription Yes   Weight 5lbs   Reps 10-15   Time 10 Minutes     Recumbant Bike   Level 3  upright scifit bike   Minutes 10   METs 4.8     NuStep   Level 4   SPM 90   Minutes 10   METs 3.8     Track   Laps 14   Minutes 10   METs 3.43     Home Exercise Plan   Plans to continue exercise at Home (comment)  walking   Frequency Add 2 additional days to program exercise sessions.   Initial Home Exercises Provided 07/01/17      Nutrition:  Target Goals: Understanding of nutrition guidelines, daily intake of sodium 1500mg , cholesterol 200mg , calories 30% from fat and 7% or less from saturated fats, daily to have 5 or more servings of fruits and vegetables.  Biometrics:     Pre Biometrics - 06/09/17 0931      Pre Biometrics   Height 5\' 7"  (1.702 m)   Weight 167 lb 15.9 oz (76.2 kg)   Waist Circumference 39.5 inches   Hip Circumference 39 inches   Waist to Hip Ratio 1.01 %   BMI (Calculated) 26.3   Triceps Skinfold 20 mm   Grip Strength 33 kg    Flexibility 9.5 in   Single Leg Stand 5 seconds       Nutrition Therapy Plan and Nutrition Goals:     Nutrition Therapy & Goals - 06/09/17 1351      Nutrition Therapy   Diet Therapeutic Lifestyle Changes - Lacto vegetarian     Personal Nutrition Goals   Nutrition Goal Pt able to name foods that affect blood glucose      Intervention Plan   Intervention Prescribe, educate and counsel regarding individualized specific dietary modifications aiming towards targeted core components such as weight, hypertension, lipid management, diabetes, heart failure and other comorbidities.   Expected Outcomes Short Term Goal: Understand basic principles of dietary content, such as calories, fat, sodium, cholesterol and nutrients.;Long Term Goal: Adherence to prescribed nutrition plan.      Nutrition Discharge: Nutrition Scores:     Nutrition Assessments - 06/09/17 1353      MEDFICTS Scores   Pre Score 28      Nutrition Goals Re-Evaluation:   Nutrition Goals Re-Evaluation:   Nutrition Goals Discharge (Final Nutrition Goals Re-Evaluation):   Psychosocial: Target Goals: Acknowledge presence or absence of significant depression and/or stress, maximize coping skills, provide positive support system. Participant is able to verbalize types and ability to use techniques and skills needed for reducing stress and depression.  Initial Review & Psychosocial Screening:     Initial Psych Review & Screening - 06/09/17 1126      Initial Review   Current issues with None Identified     Family Dynamics   Good Support System? Yes  spouse, children   Comments upon brief assessment, no psychosocial needs identified, no inteventions necessary      Barriers   Psychosocial barriers to participate in program There are no identifiable barriers or psychosocial needs.  Screening Interventions   Interventions Encouraged to exercise;Provide feedback about the scores to participant       Quality of Life Scores:     Quality of Life - 06/09/17 1102      Quality of Life Scores   Health/Function Pre 30 %   Socioeconomic Pre 30 %   Psych/Spiritual Pre 30 %   Family Pre 30 %   GLOBAL Pre 30 %      PHQ-9: Recent Review Flowsheet Data    There is no flowsheet data to display.     Interpretation of Total Score  Total Score Depression Severity:  1-4 = Minimal depression, 5-9 = Mild depression, 10-14 = Moderate depression, 15-19 = Moderately severe depression, 20-27 = Severe depression   Psychosocial Evaluation and Intervention:     Psychosocial Evaluation - 07/28/17 1344      Psychosocial Evaluation & Interventions   Interventions Encouraged to exercise with the program and follow exercise prescription   Comments Pt with supportive family, denies any psychosocial needs at this time.   Expected Outcomes  will continue to display positive attitude with healthy coping skills.  Pt will contribute to his family and the communty in a meaningful way.   Continue Psychosocial Services  No Follow up required      Psychosocial Re-Evaluation:     Psychosocial Re-Evaluation    [place] Name 06/15/17 1216 07/05/17 1934 07/05/17 1935 07/28/17 1344       Psychosocial Re-Evaluation   Current issues with None Identified None Identified  - None Identified    Comments Pt with supportive family, denies any pyschosocial needs at this time. Pt with supportive family, denies any pyschosocial needs at this time. Pt with supportive family, denies any pyschosocial needs at this time. Pt with supportive family, denies any pyschosocial needs at this time.    Expected Outcomes  - Pt will continue to display positive attitude with healthy coping skills.  Pt will contribute to his family and the communty in a meaningful way.  - Pt will continue to display positive attitude with healthy coping skills.  Pt will contribute to his family and the communty in a meaningful way.    Interventions  Encouraged to attend Cardiac Rehabilitation for the exercise;Relaxation education;Stress management education Encouraged to attend Cardiac Rehabilitation for the exercise;Relaxation education;Stress management education  - Encouraged to attend Cardiac Rehabilitation for the exercise    Continue Psychosocial Services  No Follow up required No Follow up required  - No Follow up required       Psychosocial Discharge (Final Psychosocial Re-Evaluation):     Psychosocial Re-Evaluation - 07/28/17 1344      Psychosocial Re-Evaluation   Current issues with None Identified   Comments Pt with supportive family, denies any pyschosocial needs at this time.   Expected Outcomes Pt will continue to display positive attitude with healthy coping skills.  Pt will contribute to his family and the communty in a meaningful way.   Interventions Encouraged to attend Cardiac Rehabilitation for the exercise   Continue Psychosocial Services  No Follow up required      Vocational Rehabilitation: Provide vocational rehab assistance to qualifying candidates.   Vocational Rehab Evaluation & Intervention:     Vocational Rehab - 06/09/17 1126      Initial Vocational Rehab Evaluation & Intervention   Assessment shows need for Vocational Rehabilitation No  retired       Education: Education Goals: Education classes will be provided on a weekly basis, covering required  topics. Participant will state understanding/return demonstration of topics presented.  Learning Barriers/Preferences:     Learning Barriers/Preferences - 06/09/17 0919      Learning Barriers/Preferences   Learning Barriers Sight   Learning Preferences Written Material      Education Topics: Count Your Pulse:  -Group instruction provided by verbal instruction, demonstration, patient participation and written materials to support subject.  Instructors address importance of being able to find your pulse and how to count your pulse when at  home without a heart monitor.  Patients get hands on experience counting their pulse with staff help and individually.   Heart Attack, Angina, and Risk Factor Modification:  -Group instruction provided by verbal instruction, video, and written materials to support subject.  Instructors address signs and symptoms of angina and heart attacks.    Also discuss risk factors for heart disease and how to make changes to improve heart health risk factors.   CARDIAC REHAB PHASE II EXERCISE from 07/17/2017 in [place]  Date  06/17/17  Instruction Review Code  2- meets goals/outcomes      Functional Fitness:  -Group instruction provided by verbal instruction, demonstration, patient participation, and written materials to support subject.  Instructors address safety measures for doing things around the house.  Discuss how to get up and down off the floor, how to pick things up properly, how to safely get out of a chair without assistance, and balance training.   CARDIAC REHAB PHASE II EXERCISE from 07/17/2017 in [place]  Date  07/17/17  Instruction Review Code  2- meets goals/outcomes      Meditation and Mindfulness:  -Group instruction provided by verbal instruction, patient participation, and written materials to support subject.  Instructor addresses importance of mindfulness and meditation practice to help reduce stress and improve awareness.  Instructor also leads participants through a meditation exercise.    Stretching for Flexibility and Mobility:  -Group instruction provided by verbal instruction, patient participation, and written materials to support subject.  Instructors lead participants through series of stretches that are designed to increase flexibility thus improving mobility.  These stretches are additional exercise for major muscle groups that are typically performed during regular warm up and cool down.   Hands  Only CPR:  -Group verbal, video, and participation provides a basic overview of AHA guidelines for community CPR. Role-play of emergencies allow participants the opportunity to practice calling for help and chest compression technique with discussion of AED use.   Hypertension: -Group verbal and written instruction that provides a basic overview of hypertension including the most recent diagnostic guidelines, risk factor reduction with self-care instructions and medication management.   CARDIAC REHAB PHASE II EXERCISE from 07/17/2017 in [place]  Date  06/26/17  Instruction Review Code  2- meets goals/outcomes       Nutrition I class: Heart Healthy Eating:  -Group instruction provided by PowerPoint slides, verbal discussion, and written materials to support subject matter. The instructor gives an explanation and review of the Therapeutic Lifestyle Changes diet recommendations, which includes a discussion on lipid goals, dietary fat, sodium, fiber, plant stanol/sterol esters, sugar, and the components of a well-balanced, healthy diet.   CARDIAC REHAB PHASE II EXERCISE from 07/17/2017 in [place]  Date  07/13/17  Educator  RD  Instruction Review Code  Not applicable      Nutrition II class: Lifestyle Skills:  -Group instruction provided by  PowerPoint slides, verbal discussion, and written materials to support subject matter. The instructor gives an explanation and review of label reading, grocery shopping for heart health, heart healthy recipe modifications, and ways to make healthier choices when eating out.   CARDIAC REHAB PHASE II EXERCISE from 07/17/2017 in [place]  Date  07/13/17  Educator  RD  Instruction Review Code  Not applicable      Diabetes Question & Answer:  -Group instruction provided by PowerPoint slides, verbal discussion, and written materials to support subject  matter. The instructor gives an explanation and review of diabetes co-morbidities, pre- and post-prandial blood glucose goals, pre-exercise blood glucose goals, signs, symptoms, and treatment of hypoglycemia and hyperglycemia, and foot care basics.   Diabetes Blitz:  -Group instruction provided by PowerPoint slides, verbal discussion, and written materials to support subject matter. The instructor gives an explanation and review of the physiology behind type 1 and type 2 diabetes, diabetes medications and rational behind using different medications, pre- and post-prandial blood glucose recommendations and Hemoglobin A1c goals, diabetes diet, and exercise including blood glucose guidelines for exercising safely.    Portion Distortion:  -Group instruction provided by PowerPoint slides, verbal discussion, written materials, and food models to support subject matter. The instructor gives an explanation of serving size versus portion size, changes in portions sizes over the last 20 years, and what consists of a serving from each food group.   CARDIAC REHAB PHASE II EXERCISE from 07/17/2017 in [place]  Date  07/15/17  Educator  RD  Instruction Review Code  2- meets goals/outcomes      Stress Management:  -Group instruction provided by verbal instruction, video, and written materials to support subject matter.  Instructors review role of stress in heart disease and how to cope with stress positively.     Exercising on Your Own:  -Group instruction provided by verbal instruction, power point, and written materials to support subject.  Instructors discuss benefits of exercise, components of exercise, frequency and intensity of exercise, and end points for exercise.  Also discuss use of nitroglycerin and activating EMS.  Review options of places to exercise outside of rehab.  Review guidelines for sex with heart disease.   Cardiac Drugs I:  -Group instruction  provided by verbal instruction and written materials to support subject.  Instructor reviews cardiac drug classes: antiplatelets, anticoagulants, beta blockers, and statins.  Instructor discusses reasons, side effects, and lifestyle considerations for each drug class.   Cardiac Drugs II:  -Group instruction provided by verbal instruction and written materials to support subject.  Instructor reviews cardiac drug classes: angiotensin converting enzyme inhibitors (ACE-I), angiotensin II receptor blockers (ARBs), nitrates, and calcium channel blockers.  Instructor discusses reasons, side effects, and lifestyle considerations for each drug class.   CARDIAC REHAB PHASE II EXERCISE from 07/17/2017 in [place]  Date  07/08/17  Instruction Review Code  2- meets goals/outcomes      Anatomy and Physiology of the Circulatory System:  Group verbal and written instruction and models provide basic cardiac anatomy and physiology, with the coronary electrical and arterial systems. Review of: AMI, Angina, Valve disease, Heart Failure, Peripheral Artery Disease, Cardiac Arrhythmia, Pacemakers, and the ICD.   CARDIAC REHAB PHASE II EXERCISE from 07/17/2017 in [place]  Date  06/24/17  Instruction Review Code  2- meets goals/outcomes      Other Education:  -Group or individual verbal,  written, or video instructions that support the educational goals of the cardiac rehab program.   Knowledge Questionnaire Score:     Knowledge Questionnaire Score - 06/09/17 1055      Knowledge Questionnaire Score   Pre Score 17/24      Core Components/Risk Factors/Patient Goals at Admission:     Personal Goals and Risk Factors at Admission - 06/09/17 0937      Core Components/Risk Factors/Patient Goals on Admission   Heart Failure Yes   Intervention Provide a combined exercise and nutrition program that is supplemented with education, support and  counseling about heart failure. Directed toward relieving symptoms such as shortness of breath, decreased exercise tolerance, and extremity edema.   Expected Outcomes Improve functional capacity of life;Short term: Attendance in program 2-3 days a week with increased exercise capacity. Reported lower sodium intake. Reported increased fruit and vegetable intake. Reports medication compliance.;Short term: Daily weights obtained and reported for increase. Utilizing diuretic protocols set by physician.;Long term: Adoption of self-care skills and reduction of barriers for early signs and symptoms recognition and intervention leading to self-care maintenance.   Lipids Yes   Intervention Provide education and support for participant on nutrition & aerobic/resistive exercise along with prescribed medications to achieve LDL 70mg , HDL >40mg .   Expected Outcomes Short Term: Participant states understanding of desired cholesterol values and is compliant with medications prescribed. Participant is following exercise prescription and nutrition guidelines.;Long Term: Cholesterol controlled with medications as prescribed, with individualized exercise RX and with personalized nutrition plan. Value goals: LDL < 70mg , HDL > 40 mg.      Core Components/Risk Factors/Patient Goals Review:      Goals and Risk Factor Review    Row Name 07/05/17 1932 07/28/17 1345           Core Components/Risk Factors/Patient Goals Review   Personal Goals Review Weight Management/Obesity;Heart Failure;Lipids Weight Management/Obesity;Heart Failure;Lipids      Review Pt is off to a good start toward his personal goals.  Pt is consitent with attendance to exercise and education classes.   Pt is consistent with attendance to exercise and education classes.  Pt maintain his weight loss since his surgery.  No current labs noted in epic for review.      Expected Outcomes Pt will participate in Cardiac Rehab, nutrtion and lifestlye  modifcation education to decrease overall risk factors. Pt will participate in Cardiac Rehab, nutrition and lifestlye modifcation education to decrease overall risk factors.         Core Components/Risk Factors/Patient Goals at Discharge (Final Review):      Goals and Risk Factor Review - 07/28/17 1345      Core Components/Risk Factors/Patient Goals Review   Personal Goals Review Weight Management/Obesity;Heart Failure;Lipids   Review   Pt is consistent with attendance to exercise and education classes.  Pt maintain his weight loss since his surgery.  No current labs noted in epic for review.   Expected Outcomes Pt will participate in Cardiac Rehab, nutrition and lifestlye modifcation education to decrease overall risk factors.      ITP Comments:     ITP Comments    Row Name 06/09/17 0916 07/07/17 1532 07/31/17 0844       ITP Comments Medical Director, Dr. Fransico Him Medical Director, Dr. Fransico Him Medical Director, Dr. Fransico Him        Comments:  Ihor Gully is making expected progress toward personal goals after completing 15 sessions. Psychosocial Assessment -Pt exhibits positive coping skills, hopeful outlook with  supportive family. 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 No psychosocial needs identified at this time, no psychosocial interventions necessary. Pt enjoys visiting with friends and family. Pt desires to increase stamina and strength. Pt would like to be less fatigued so he may return to walking in the mall. Pt has started short distances with his mall walking. Pt works hard and has excellent attendance.   Recommend continued exercise and life style modification education including  stress management and relaxation techniques to decrease cardiac risk profile. Cherre Huger, BSN Cardiac and Training and development officer

## 2017-07-29 ENCOUNTER — Encounter (HOSPITAL_COMMUNITY): Payer: Medicare Other

## 2017-07-29 ENCOUNTER — Encounter (HOSPITAL_COMMUNITY)
Admission: RE | Admit: 2017-07-29 | Discharge: 2017-07-29 | Disposition: A | Discharge status: Home / Self Care | Payer: Medicare Other | Source: Ambulatory Visit | Attending: Cardiology | Admitting: Cardiology

## 2017-07-29 DIAGNOSIS — Z951 Presence of aortocoronary bypass graft: Secondary | ICD-10-CM

## 2017-07-29 DIAGNOSIS — I5022 Chronic systolic (congestive) heart failure: Secondary | ICD-10-CM

## 2017-07-29 DIAGNOSIS — Z48812 Encounter for surgical aftercare following surgery on the circulatory system: Secondary | ICD-10-CM | POA: Diagnosis not present

## 2017-07-31 ENCOUNTER — Encounter (HOSPITAL_COMMUNITY)
Admission: RE | Admit: 2017-07-31 | Discharge: 2017-07-31 | Disposition: A | Discharge status: Home / Self Care | Payer: Medicare Other | Source: Ambulatory Visit | Attending: Cardiology | Admitting: Cardiology

## 2017-07-31 ENCOUNTER — Encounter (HOSPITAL_COMMUNITY): Payer: Medicare Other

## 2017-07-31 DIAGNOSIS — Z951 Presence of aortocoronary bypass graft: Secondary | ICD-10-CM | POA: Diagnosis present

## 2017-07-31 DIAGNOSIS — I5022 Chronic systolic (congestive) heart failure: Secondary | ICD-10-CM

## 2017-07-31 DIAGNOSIS — Z48812 Encounter for surgical aftercare following surgery on the circulatory system: Secondary | ICD-10-CM | POA: Diagnosis not present

## 2017-08-03 ENCOUNTER — Encounter (HOSPITAL_COMMUNITY): Payer: Medicare Other

## 2017-08-03 ENCOUNTER — Encounter (HOSPITAL_COMMUNITY)
Admission: RE | Admit: 2017-08-03 | Discharge: 2017-08-03 | Disposition: A | Discharge status: Home / Self Care | Payer: Medicare Other | Source: Ambulatory Visit | Attending: Cardiology | Admitting: Cardiology

## 2017-08-03 DIAGNOSIS — Z951 Presence of aortocoronary bypass graft: Secondary | ICD-10-CM

## 2017-08-03 DIAGNOSIS — Z48812 Encounter for surgical aftercare following surgery on the circulatory system: Secondary | ICD-10-CM | POA: Diagnosis not present

## 2017-08-03 DIAGNOSIS — I5022 Chronic systolic (congestive) heart failure: Secondary | ICD-10-CM

## 2017-08-05 ENCOUNTER — Encounter (HOSPITAL_COMMUNITY)
Admission: RE | Admit: 2017-08-05 | Discharge: 2017-08-05 | Disposition: A | Discharge status: Home / Self Care | Payer: Medicare Other | Source: Ambulatory Visit | Attending: Cardiology | Admitting: Cardiology

## 2017-08-05 ENCOUNTER — Encounter (HOSPITAL_COMMUNITY): Payer: Medicare Other

## 2017-08-05 DIAGNOSIS — I5022 Chronic systolic (congestive) heart failure: Secondary | ICD-10-CM

## 2017-08-05 DIAGNOSIS — Z48812 Encounter for surgical aftercare following surgery on the circulatory system: Secondary | ICD-10-CM | POA: Diagnosis not present

## 2017-08-05 DIAGNOSIS — Z951 Presence of aortocoronary bypass graft: Secondary | ICD-10-CM

## 2017-08-07 ENCOUNTER — Encounter (HOSPITAL_COMMUNITY): Payer: Medicare Other

## 2017-08-07 ENCOUNTER — Encounter (HOSPITAL_COMMUNITY)
Admission: RE | Admit: 2017-08-07 | Discharge: 2017-08-07 | Disposition: A | Discharge status: Home / Self Care | Payer: Medicare Other | Source: Ambulatory Visit | Attending: Cardiology | Admitting: Cardiology

## 2017-08-07 DIAGNOSIS — Z951 Presence of aortocoronary bypass graft: Secondary | ICD-10-CM

## 2017-08-07 DIAGNOSIS — I5022 Chronic systolic (congestive) heart failure: Secondary | ICD-10-CM

## 2017-08-10 ENCOUNTER — Encounter (HOSPITAL_COMMUNITY): Payer: Medicare Other

## 2017-08-10 ENCOUNTER — Encounter (HOSPITAL_COMMUNITY)
Admission: RE | Admit: 2017-08-10 | Discharge: 2017-08-10 | Disposition: A | Discharge status: Home / Self Care | Payer: Medicare Other | Source: Ambulatory Visit | Attending: Cardiology | Admitting: Cardiology

## 2017-08-10 DIAGNOSIS — I5022 Chronic systolic (congestive) heart failure: Secondary | ICD-10-CM

## 2017-08-10 DIAGNOSIS — Z951 Presence of aortocoronary bypass graft: Secondary | ICD-10-CM

## 2017-08-10 DIAGNOSIS — Z48812 Encounter for surgical aftercare following surgery on the circulatory system: Secondary | ICD-10-CM | POA: Diagnosis not present

## 2017-08-12 ENCOUNTER — Encounter (HOSPITAL_COMMUNITY)
Admission: RE | Admit: 2017-08-12 | Discharge: 2017-08-12 | Disposition: A | Discharge status: Home / Self Care | Payer: Medicare Other | Source: Ambulatory Visit | Attending: Cardiology | Admitting: Cardiology

## 2017-08-12 ENCOUNTER — Encounter (HOSPITAL_COMMUNITY): Payer: Medicare Other

## 2017-08-12 DIAGNOSIS — Z48812 Encounter for surgical aftercare following surgery on the circulatory system: Secondary | ICD-10-CM | POA: Diagnosis not present

## 2017-08-12 DIAGNOSIS — Z951 Presence of aortocoronary bypass graft: Secondary | ICD-10-CM

## 2017-08-12 DIAGNOSIS — I5022 Chronic systolic (congestive) heart failure: Secondary | ICD-10-CM

## 2017-08-14 ENCOUNTER — Encounter (HOSPITAL_COMMUNITY): Payer: Medicare Other

## 2017-08-14 ENCOUNTER — Encounter (HOSPITAL_COMMUNITY)
Admission: RE | Admit: 2017-08-14 | Discharge: 2017-08-14 | Disposition: A | Discharge status: Home / Self Care | Payer: Medicare Other | Source: Ambulatory Visit | Attending: Cardiology | Admitting: Cardiology

## 2017-08-14 DIAGNOSIS — Z48812 Encounter for surgical aftercare following surgery on the circulatory system: Secondary | ICD-10-CM | POA: Diagnosis not present

## 2017-08-14 DIAGNOSIS — I5022 Chronic systolic (congestive) heart failure: Secondary | ICD-10-CM

## 2017-08-14 DIAGNOSIS — Z951 Presence of aortocoronary bypass graft: Secondary | ICD-10-CM

## 2017-08-17 ENCOUNTER — Encounter (HOSPITAL_COMMUNITY)
Admission: RE | Admit: 2017-08-17 | Discharge: 2017-08-17 | Disposition: A | Discharge status: Home / Self Care | Payer: Medicare Other | Source: Ambulatory Visit | Attending: Cardiology | Admitting: Cardiology

## 2017-08-17 ENCOUNTER — Encounter (HOSPITAL_COMMUNITY): Payer: Medicare Other

## 2017-08-17 DIAGNOSIS — I5022 Chronic systolic (congestive) heart failure: Secondary | ICD-10-CM

## 2017-08-17 DIAGNOSIS — Z951 Presence of aortocoronary bypass graft: Secondary | ICD-10-CM

## 2017-08-17 DIAGNOSIS — Z48812 Encounter for surgical aftercare following surgery on the circulatory system: Secondary | ICD-10-CM | POA: Diagnosis not present

## 2017-08-19 ENCOUNTER — Encounter (HOSPITAL_COMMUNITY): Payer: Medicare Other

## 2017-08-19 ENCOUNTER — Encounter (HOSPITAL_COMMUNITY)
Admission: RE | Admit: 2017-08-19 | Discharge: 2017-08-19 | Disposition: A | Discharge status: Home / Self Care | Payer: Medicare Other | Source: Ambulatory Visit | Attending: Cardiology | Admitting: Cardiology

## 2017-08-19 DIAGNOSIS — I5022 Chronic systolic (congestive) heart failure: Secondary | ICD-10-CM

## 2017-08-19 DIAGNOSIS — Z48812 Encounter for surgical aftercare following surgery on the circulatory system: Secondary | ICD-10-CM | POA: Diagnosis not present

## 2017-08-19 DIAGNOSIS — Z951 Presence of aortocoronary bypass graft: Secondary | ICD-10-CM

## 2017-08-21 ENCOUNTER — Encounter (HOSPITAL_COMMUNITY): Payer: Medicare Other

## 2017-08-24 ENCOUNTER — Encounter (HOSPITAL_COMMUNITY)
Admission: RE | Admit: 2017-08-24 | Discharge: 2017-08-24 | Disposition: A | Discharge status: Home / Self Care | Payer: Medicare Other | Source: Ambulatory Visit | Attending: Cardiology | Admitting: Cardiology

## 2017-08-24 ENCOUNTER — Encounter (HOSPITAL_COMMUNITY): Payer: Medicare Other

## 2017-08-24 DIAGNOSIS — Z48812 Encounter for surgical aftercare following surgery on the circulatory system: Secondary | ICD-10-CM | POA: Diagnosis not present

## 2017-08-24 DIAGNOSIS — Z951 Presence of aortocoronary bypass graft: Secondary | ICD-10-CM

## 2017-08-24 DIAGNOSIS — I5022 Chronic systolic (congestive) heart failure: Secondary | ICD-10-CM

## 2017-08-26 ENCOUNTER — Encounter (HOSPITAL_COMMUNITY): Payer: Medicare Other

## 2017-08-26 ENCOUNTER — Telehealth (HOSPITAL_COMMUNITY): Payer: Self-pay | Admitting: *Deleted

## 2017-08-28 ENCOUNTER — Encounter (HOSPITAL_COMMUNITY)
Admission: RE | Admit: 2017-08-28 | Discharge: 2017-08-28 | Disposition: A | Discharge status: Home / Self Care | Payer: Medicare Other | Source: Ambulatory Visit | Attending: Cardiology | Admitting: Cardiology

## 2017-08-28 ENCOUNTER — Encounter (HOSPITAL_COMMUNITY): Payer: Medicare Other

## 2017-08-28 DIAGNOSIS — Z48812 Encounter for surgical aftercare following surgery on the circulatory system: Secondary | ICD-10-CM | POA: Diagnosis not present

## 2017-08-28 DIAGNOSIS — Z951 Presence of aortocoronary bypass graft: Secondary | ICD-10-CM

## 2017-08-28 DIAGNOSIS — I5022 Chronic systolic (congestive) heart failure: Secondary | ICD-10-CM

## 2017-08-28 NOTE — Progress Notes (Signed)
Cardiac Individual Treatment Plan  Patient Details  Name: Luis [name] MRN: [id number] Date of Birth: [birth year] Referring Provider:     CARDIAC REHAB PHASE II ORIENTATION from 06/09/2017 in [facility]  Referring Provider  Kela Millin MD      Initial Encounter Date:    CARDIAC REHAB PHASE II ORIENTATION from 06/09/2017 in [facility]  Date  06/09/17  Referring Provider  Kela Millin MD      Visit Diagnosis: Chronic systolic congestive heart failure ([place])  S/P CABG x 2  Patient's Home Medications on Admission:  Current Outpatient Medications:  .  aspirin 81 MG EC tablet, Take 1 tablet (81 mg total) by mouth daily., Disp: , Rfl:  .  carvedilol (COREG) 3.125 MG tablet, Take 3.125 mg by mouth 3 (three) times daily., Disp: , Rfl:  .  clopidogrel (PLAVIX) 75 MG tablet, Take 1 tablet (75 mg total) by mouth daily., Disp: 90 tablet, Rfl: 1 .  Multiple Vitamin (MULTIVITAMIN) capsule, Take 1 capsule by mouth daily., Disp: , Rfl:  .  psyllium (METAMUCIL SMOOTH TEXTURE) 58.6 % powder, Take 1 packet by mouth daily after supper. (Patient taking differently: Take 1 packet by mouth daily as needed. ), Disp: 283 g, Rfl: 12 .  rosuvastatin (CRESTOR) 40 MG tablet, Take 40 mg by mouth daily., Disp: , Rfl: 4  Past Medical History: Past Medical History:  Diagnosis Date  . Chronic kidney disease    kidney stones; noted per H&P per Dr [name] 02/06/2015 chronic kidney disease stage II  . Diabetes mellitus without complication ([place])    per H&P Dr [name] noted to be prediabetic 02/06/2015  . Dyslipidemia   . History of hepatomegaly    with fatty liver discussed per H&P per Dr [name] 02/06/2015 secondary to abd ultrasound   . Sleep apnea    AHI 17 on sleep study 08/23/2013 pt states was given CPAP machine but sent it back - does not use   . Vitamin D deficiency    as noted per H&P per Dr [name] 02/06/2015    Tobacco Use: Social  History   Tobacco Use  Smoking Status Never Smoker  Smokeless Tobacco Never Used    Labs: Recent Review Flowsheet Data    Labs for ITP Cardiac and Pulmonary Rehab Latest Ref Rng & Units 04/06/2017 04/07/2017 04/07/2017 04/08/2017 04/09/2017   Hemoglobin A1c 4.8 - 5.6 % - - - - -   PHART 7.350 - 7.450 - - - - -   PCO2ART 32.0 - 48.0 mmHg - - - - -   HCO3 20.0 - 28.0 mmol/L - - - - -   TCO2 0 - 100 mmol/L - - - - -   ACIDBASEDEF 0.0 - 2.0 mmol/L - - - - -   O2SAT % 54.4 49.3 58.3 56.6 64.4      Capillary Blood Glucose: Lab Results  Component Value Date   GLUCAP 108 (H) 04/10/2017   GLUCAP 107 (H) 04/10/2017   GLUCAP 131 (H) 04/09/2017   GLUCAP 98 04/09/2017   GLUCAP 118 (H) 04/09/2017     Exercise Target Goals:    Exercise Program Goal: Individual exercise prescription set with THRR, safety & activity barriers. Participant demonstrates ability to understand and report RPE using BORG scale, to self-measure pulse accurately, and to acknowledge the importance of the exercise prescription.  Exercise Prescription Goal: Starting with aerobic activity 30 plus minutes a day, 3 days per week for initial exercise  prescription. Provide home exercise prescription and guidelines that participant acknowledges understanding prior to discharge.  Activity Barriers & Risk Stratification: Activity Barriers & Cardiac Risk Stratification - 06/09/17 0919      Activity Barriers & Cardiac Risk Stratification   Activity Barriers  Deconditioning;Muscular Weakness;Assistive Device;Other (comment)    Comments  B knee discomfort when climbing stairs or incline    Cardiac Risk Stratification  High       6 Minute Walk: 6 Minute Walk    Row Name 06/09/17 1055         6 Minute Walk   Phase  Initial     Distance  882 feet     Walk Time  6 minutes     # of Rest Breaks  0     MPH  1.67     METS  2.3     RPE  15     VO2 Peak  8.04     Symptoms  Yes (comment)     Comments  fatigue!     Resting  HR  75 bpm     Resting BP  110/60     Resting Oxygen Saturation   98 %     Exercise Oxygen Saturation  during 6 min walk  98 %     Max Ex. HR  99 bpm     Max Ex. BP  140/80     2 Minute Post BP  100/54        Oxygen Initial Assessment:   Oxygen Re-Evaluation:   Oxygen Discharge (Final Oxygen Re-Evaluation):   Initial Exercise Prescription: Initial Exercise Prescription - 06/09/17 1000      Date of Initial Exercise RX and Referring Provider   Date  06/09/17    Referring Provider  Kela Millin MD      Recumbant Bike   Level  1.5 upright scifit    Minutes  10    METs  1.5      NuStep   Level  2    SPM  70    Minutes  10    METs  1.5      Track   Laps  6    Minutes  10    METs  2.03      Prescription Details   Frequency (times per week)  3    Duration  Progress to 30 minutes of continuous aerobic without signs/symptoms of physical distress      Intensity   THRR 40-80% of Max Heartrate  60-120    Ratings of Perceived Exertion  11-13    Perceived Dyspnea  0-4      Progression   Progression  Continue to progress workloads to maintain intensity without signs/symptoms of physical distress.      Resistance Training   Training Prescription  Yes    Weight  2lbs    Reps  10-15       Perform Capillary Blood Glucose checks as needed.  Exercise Prescription Changes:  Exercise Prescription Changes    Row Name 06/15/17 0924 07/01/17 0900 07/20/17 1600 08/12/17 1504 08/24/17 1510     Response to Exercise   Blood Pressure (Admit)  106/64  99/66  102/68  120/70  108/60   Blood Pressure (Exercise)  104/72  108/78  112/71  126/70  106/60   Blood Pressure (Exit)  98/66  98/64  95/68  100/60  110/73   Heart Rate (Admit)  82 bpm  74 bpm  75 bpm  98  bpm  89 bpm   Heart Rate (Exercise)  92 bpm  111 bpm  107 bpm  133 bpm  131 bpm   Heart Rate (Exit)  82 bpm  74 bpm  82 bpm  100 bpm  85 bpm   Rating of Perceived Exertion (Exercise)  12  12  12  12  12    Symptoms   none  none  none  none  none   Comments  pt oriented to exercise equipment  pt is responded well to exercise prescription  -  -  -   Duration  Continue with 30 min of aerobic exercise without signs/symptoms of physical distress.  Continue with 30 min of aerobic exercise without signs/symptoms of physical distress.  Continue with 30 min of aerobic exercise without signs/symptoms of physical distress.  Continue with 30 min of aerobic exercise without signs/symptoms of physical distress.  Continue with 30 min of aerobic exercise without signs/symptoms of physical distress.   Intensity  THRR unchanged  THRR unchanged  THRR unchanged  THRR unchanged  THRR unchanged     Progression   Progression  Continue to progress workloads to maintain intensity without signs/symptoms of physical distress.  Continue to progress workloads to maintain intensity without signs/symptoms of physical distress.  Continue to progress workloads to maintain intensity without signs/symptoms of physical distress.  Continue to progress workloads to maintain intensity without signs/symptoms of physical distress.  Continue to progress workloads to maintain intensity without signs/symptoms of physical distress.   Average METs  2.4  3.4  4  4.7  4.7     Resistance Training   Training Prescription  Yes  Yes  Yes  - relaxation  Yes   Weight  2lbs  5lbs  5lbs  -  5lbs   Reps  10-15  10-15  10-15  -  10-15   Time  10 Minutes  10 Minutes  10 Minutes  -  10 Minutes     Recumbant Bike   Level  1.5 upright scifit bike  1.5 upright scifit bike  3 upright scifit bike  4 upright scifit bike  6 upright scifit bike   Minutes  10  10  10  10  10    METs  2.4  2.9  4.8  5.6  5.8     NuStep   Level  2  2  4  4  5    SPM  80  90  90  100  85   Minutes  10  10  10  10  10    METs  2.4  3.5  3.8  4.6  4.6     Track   Laps  11  16  14  16  16    Minutes  10  10  10  10  10    METs  2.92  3.79  3.43  3.79  3.79     Home Exercise Plan   Plans to  continue exercise at  -  Home (comment) walking   Home (comment) walking  Home (comment) walking  Home (comment) walking   Frequency  -  Add 2 additional days to program exercise sessions.  Add 2 additional days to program exercise sessions.  Add 2 additional days to program exercise sessions.  Add 2 additional days to program exercise sessions.   Initial Home Exercises Provided  -  07/01/17  07/01/17  07/01/17  07/01/17      Exercise Comments:  Exercise Comments  [place] Name 07/06/17 1639 07/28/17 1647 08/25/17 1517       Exercise Comments  Reviewed METs and goals. Pt is tolerating exercise very well; will continue to monitor pt's progress and activity levels.   Reviewed METs and goals. Pt is tolerating exercise very well; will continue to monitor pt's progress and activity levels.   Reviewed METs and goals. Pt is tolerating exercise very well; will continue to monitor pt's progress and activity levels.         Exercise Goals and Review:  Exercise Goals    Row Name 06/09/17 0920 06/09/17 1131           Exercise Goals   Increase Physical Activity  Yes  -      Intervention  Provide advice, education, support and counseling about physical activity/exercise needs.;Develop an individualized exercise prescription for aerobic and resistive training based on initial evaluation findings, risk stratification, comorbidities and participant's personal goals.  -      Expected Outcomes  Achievement of increased cardiorespiratory fitness and enhanced flexibility, muscular endurance and strength shown through measurements of functional capacity and personal statement of participant.  -      Increase Strength and Stamina  Yes  - reduc fatigue! increase walking tolerance and be more active      Intervention  Provide advice, education, support and counseling about physical activity/exercise needs.;Develop an individualized exercise prescription for aerobic and resistive training based on initial evaluation  findings, risk stratification, comorbidities and participant's personal goals.  -      Expected Outcomes  Achievement of increased cardiorespiratory fitness and enhanced flexibility, muscular endurance and strength shown through measurements of functional capacity and personal statement of participant.  -      Able to understand and use rate of perceived exertion (RPE) scale  Yes  -      Intervention  Provide education and explanation on how to use RPE scale  -      Expected Outcomes  Short Term: Able to use RPE daily in rehab to express subjective intensity level;Long Term:  Able to use RPE to guide intensity level when exercising independently  -      Knowledge and understanding of Target Heart Rate Range (THRR)  Yes  -      Intervention  Provide education and explanation of THRR including how the numbers were predicted and where they are located for reference  -      Expected Outcomes  Short Term: Able to state/look up THRR;Short Term: Able to use daily as guideline for intensity in rehab;Long Term: Able to use THRR to govern intensity when exercising independently  -      Able to check pulse independently  Yes  -      Intervention  Provide education and demonstration on how to check pulse in carotid and radial arteries.;Review the importance of being able to check your own pulse for safety during independent exercise  -      Expected Outcomes  Long Term: Able to check pulse independently and accurately;Short Term: Able to explain why pulse checking is important during independent exercise  -      Understanding of Exercise Prescription  Yes  -      Intervention  Provide education, explanation, and written materials on patient's individual exercise prescription  -      Expected Outcomes  Short Term: Able to explain program exercise prescription;Long Term: Able to explain home exercise prescription to exercise independently  -  Exercise Goals Re-Evaluation : Exercise Goals Re-Evaluation     Row Name 07/01/17 1227 07/01/17 1231 07/28/17 1648 08/25/17 1515       Exercise Goal Re-Evaluation   Exercise Goals Review  Increase Physical Activity;Able to understand and use rate of perceived exertion (RPE) scale;Understanding of Exercise Prescription  Increase Physical Activity;Able to understand and use rate of perceived exertion (RPE) scale;Knowledge and understanding of Target Heart Rate Range (THRR);Understanding of Exercise Prescription;Increase Strength and Stamina  Increase Physical Activity;Able to understand and use rate of perceived exertion (RPE) scale;Knowledge and understanding of Target Heart Rate Range (THRR);Understanding of Exercise Prescription;Increase Strength and Stamina  Increase Physical Activity;Able to understand and use rate of perceived exertion (RPE) scale;Knowledge and understanding of Target Heart Rate Range (THRR);Understanding of Exercise Prescription;Increase Strength and Stamina    Comments  -  Reviewed home exercise with pt today.  Pt plans to walk at mall or shopping center and use stationary bike for exercise, 3x/week in addition to coming to cardiac rehab.  Reviewed THR, pulse, RPE, sign and symptoms, and when to call 911 or MD.  Also discussed weather considerations and indoor options.  Pt voiced understanding.  Pt is making great progress in cardiac rehab. Pt is currently averaging 5.6 METs on the Nustep machine at a load of 4.  Pt tolerates WL increases very well. Pt is up to a level 6 on scifit bike. Pt will be introduced to weights on 08/26/17.    Expected Outcomes  -  Pt will be compliant with HEP and improve in cardiorespiratory fitness.  Pt will continue to improve in cardiorespiratory fitness  Pt will continue to improve in cardiorespiratory fitness and improve on UE/LE strength.        Discharge Exercise Prescription (Final Exercise Prescription Changes): Exercise Prescription Changes - 08/24/17 1510      Response to Exercise   Blood Pressure  (Admit)  108/60    Blood Pressure (Exercise)  106/60    Blood Pressure (Exit)  110/73    Heart Rate (Admit)  89 bpm    Heart Rate (Exercise)  131 bpm    Heart Rate (Exit)  85 bpm    Rating of Perceived Exertion (Exercise)  12    Symptoms  none    Duration  Continue with 30 min of aerobic exercise without signs/symptoms of physical distress.    Intensity  THRR unchanged      Progression   Progression  Continue to progress workloads to maintain intensity without signs/symptoms of physical distress.    Average METs  4.7      Resistance Training   Training Prescription  Yes    Weight  5lbs    Reps  10-15    Time  10 Minutes      Recumbant Bike   Level  6 upright scifit bike    Minutes  10    METs  5.8      NuStep   Level  5    SPM  85    Minutes  10    METs  4.6      Track   Laps  16    Minutes  10    METs  3.79      Home Exercise Plan   Plans to continue exercise at  Home (comment) walking    Frequency  Add 2 additional days to program exercise sessions.    Initial Home Exercises Provided  07/01/17       Nutrition:  Target  Goals: Understanding of nutrition guidelines, daily intake of sodium 1500mg , cholesterol 200mg , calories 30% from fat and 7% or less from saturated fats, daily to have 5 or more servings of fruits and vegetables.  Biometrics: Pre Biometrics - 06/09/17 0931      Pre Biometrics   Height  5\' 7"  (1.702 m)    Weight  167 lb 15.9 oz (76.2 kg)    Waist Circumference  39.5 inches    Hip Circumference  39 inches    Waist to Hip Ratio  1.01 %    BMI (Calculated)  26.3    Triceps Skinfold  20 mm    Grip Strength  33 kg    Flexibility  9.5 in    Single Leg Stand  5 seconds        Nutrition Therapy Plan and Nutrition Goals: Nutrition Therapy & Goals - 06/09/17 1351      Nutrition Therapy   Diet  Therapeutic Lifestyle Changes - Lacto vegetarian      Personal Nutrition Goals   Nutrition Goal  Pt able to name foods that affect blood glucose        Intervention Plan   Intervention  Prescribe, educate and counsel regarding individualized specific dietary modifications aiming towards targeted core components such as weight, hypertension, lipid management, diabetes, heart failure and other comorbidities.    Expected Outcomes  Short Term Goal: Understand basic principles of dietary content, such as calories, fat, sodium, cholesterol and nutrients.;Long Term Goal: Adherence to prescribed nutrition plan.       Nutrition Discharge: Nutrition Scores: Nutrition Assessments - 06/09/17 1353      MEDFICTS Scores   Pre Score  28       Nutrition Goals Re-Evaluation:   Nutrition Goals Re-Evaluation:   Nutrition Goals Discharge (Final Nutrition Goals Re-Evaluation):   Psychosocial: Target Goals: Acknowledge presence or absence of significant depression and/or stress, maximize coping skills, provide positive support system. Participant is able to verbalize types and ability to use techniques and skills needed for reducing stress and depression.  Initial Review & Psychosocial Screening: Initial Psych Review & Screening - 06/09/17 1126      Initial Review   Current issues with  None Identified      Family Dynamics   Good Support System?  Yes spouse, children    Comments  upon brief assessment, no psychosocial needs identified, no inteventions necessary       Barriers   Psychosocial barriers to participate in program  There are no identifiable barriers or psychosocial needs.      Screening Interventions   Interventions  Encouraged to exercise;Provide feedback about the scores to participant       Quality of Life Scores: Quality of Life - 06/09/17 1102      Quality of Life Scores   Health/Function Pre  30 %    Socioeconomic Pre  30 %    Psych/Spiritual Pre  30 %    Family Pre  30 %    GLOBAL Pre  30 %       PHQ-9: Recent Review Flowsheet Data    There is no flowsheet data to display.     Interpretation of Total  Score  Total Score Depression Severity:  1-4 = Minimal depression, 5-9 = Mild depression, 10-14 = Moderate depression, 15-19 = Moderately severe depression, 20-27 = Severe depression   Psychosocial Evaluation and Intervention: Psychosocial Evaluation - 07/28/17 1344      Psychosocial Evaluation & Interventions   Interventions  Encouraged to exercise with the program and follow exercise prescription    Comments  Pt with supportive family, denies any psychosocial needs at this time.    Expected Outcomes   will continue to display positive attitude with healthy coping skills.  Pt will contribute to his family and the communty in a meaningful way.    Continue Psychosocial Services   No Follow up required       Psychosocial Re-Evaluation: Psychosocial Re-Evaluation    [place] Name 06/15/17 1216 07/05/17 1934 07/05/17 1935 07/28/17 1344 08/28/17 1143     Psychosocial Re-Evaluation   Current issues with  None Identified  None Identified  -  None Identified  None Identified   Comments  Pt with supportive family, denies any pyschosocial needs at this time.  Pt with supportive family, denies any pyschosocial needs at this time.  Pt with supportive family, denies any pyschosocial needs at this time.  Pt with supportive family, denies any pyschosocial needs at this time.  Pt with supportive family, denies any pyschosocial needs at this time.   Expected Outcomes  -  Pt will continue to display positive attitude with healthy coping skills.  Pt will contribute to his family and the communty in a meaningful way.  -  Pt will continue to display positive attitude with healthy coping skills.  Pt will contribute to his family and the communty in a meaningful way.  Pt will continue to display positive attitude with healthy coping skills.  Pt will contribute to his family and the communty in a meaningful way.   Interventions  Encouraged to attend Cardiac Rehabilitation for the exercise;Relaxation education;Stress  management education  Encouraged to attend Cardiac Rehabilitation for the exercise;Relaxation education;Stress management education  -  Encouraged to attend Cardiac Rehabilitation for the exercise  Encouraged to attend Cardiac Rehabilitation for the exercise   Continue Psychosocial Services   No Follow up required  No Follow up required  -  No Follow up required  No Follow up required      Psychosocial Discharge (Final Psychosocial Re-Evaluation): Psychosocial Re-Evaluation - 08/28/17 1143      Psychosocial Re-Evaluation   Current issues with  None Identified    Comments  Pt with supportive family, denies any pyschosocial needs at this time.    Expected Outcomes  Pt will continue to display positive attitude with healthy coping skills.  Pt will contribute to his family and the communty in a meaningful way.    Interventions  Encouraged to attend Cardiac Rehabilitation for the exercise    Continue Psychosocial Services   No Follow up required       Vocational Rehabilitation: Provide vocational rehab assistance to qualifying candidates.   Vocational Rehab Evaluation & Intervention: Vocational Rehab - 06/09/17 1126      Initial Vocational Rehab Evaluation & Intervention   Assessment shows need for Vocational Rehabilitation  No retired        Education: Education Goals: Education classes will be provided on a weekly basis, covering required topics. Participant will state understanding/return demonstration of topics presented.  Learning Barriers/Preferences: Learning Barriers/Preferences - 06/09/17 0919      Learning Barriers/Preferences   Learning Barriers  Sight    Learning Preferences  Written Material       Education Topics: Count Your Pulse:  -Group instruction provided by verbal instruction, demonstration, patient participation and written materials to support subject.  Instructors address importance of being able to find your pulse and how to count your pulse when at  home  without a heart monitor.  Patients get hands on experience counting their pulse with staff help and individually.   CARDIAC REHAB PHASE II EXERCISE from 08/14/2017 in [facility]  Date  07/31/17  Instruction Review Code  2- meets goals/outcomes      Heart Attack, Angina, and Risk Factor Modification:  -Group instruction provided by verbal instruction, video, and written materials to support subject.  Instructors address signs and symptoms of angina and heart attacks.    Also discuss risk factors for heart disease and how to make changes to improve heart health risk factors.   CARDIAC REHAB PHASE II EXERCISE from 08/14/2017 in [facility]  Date  06/17/17  Instruction Review Code  2- meets goals/outcomes      Functional Fitness:  -Group instruction provided by verbal instruction, demonstration, patient participation, and written materials to support subject.  Instructors address safety measures for doing things around the house.  Discuss how to get up and down off the floor, how to pick things up properly, how to safely get out of a chair without assistance, and balance training.   CARDIAC REHAB PHASE II EXERCISE from 08/14/2017 in [facility]  Date  08/14/17  Instruction Review Code  2- meets goals/outcomes      Meditation and Mindfulness:  -Group instruction provided by verbal instruction, patient participation, and written materials to support subject.  Instructor addresses importance of mindfulness and meditation practice to help reduce stress and improve awareness.  Instructor also leads participants through a meditation exercise.    Stretching for Flexibility and Mobility:  -Group instruction provided by verbal instruction, patient participation, and written materials to support subject.  Instructors lead participants through series of stretches that are designed to increase flexibility thus  improving mobility.  These stretches are additional exercise for major muscle groups that are typically performed during regular warm up and cool down.   Hands Only CPR:  -Group verbal, video, and participation provides a basic overview of AHA guidelines for community CPR. Role-play of emergencies allow participants the opportunity to practice calling for help and chest compression technique with discussion of AED use.   Hypertension: -Group verbal and written instruction that provides a basic overview of hypertension including the most recent diagnostic guidelines, risk factor reduction with self-care instructions and medication management.   CARDIAC REHAB PHASE II EXERCISE from 08/14/2017 in [facility]  Date  06/26/17  Instruction Review Code  2- meets goals/outcomes       Nutrition I class: Heart Healthy Eating:  -Group instruction provided by PowerPoint slides, verbal discussion, and written materials to support subject matter. The instructor gives an explanation and review of the Therapeutic Lifestyle Changes diet recommendations, which includes a discussion on lipid goals, dietary fat, sodium, fiber, plant stanol/sterol esters, sugar, and the components of a well-balanced, healthy diet.   CARDIAC REHAB PHASE II EXERCISE from 08/14/2017 in [place]  Date  07/13/17  Educator  RD  Instruction Review Code  Not applicable      Nutrition II class: Lifestyle Skills:  -Group instruction provided by PowerPoint slides, verbal discussion, and written materials to support subject matter. The instructor gives an explanation and review of label reading, grocery shopping for heart health, heart healthy recipe modifications, and ways to make healthier choices when eating out.   CARDIAC REHAB PHASE II EXERCISE from 08/14/2017 in [facility]  CARDIAC REHAB  Date  07/13/17  Educator  RD  Instruction Review Code  Not  applicable      Diabetes Question & Answer:  -Group instruction provided by PowerPoint slides, verbal discussion, and written materials to support subject matter. The instructor gives an explanation and review of diabetes co-morbidities, pre- and post-prandial blood glucose goals, pre-exercise blood glucose goals, signs, symptoms, and treatment of hypoglycemia and hyperglycemia, and foot care basics.   Diabetes Blitz:  -Group instruction provided by PowerPoint slides, verbal discussion, and written materials to support subject matter. The instructor gives an explanation and review of the physiology behind type 1 and type 2 diabetes, diabetes medications and rational behind using different medications, pre- and post-prandial blood glucose recommendations and Hemoglobin A1c goals, diabetes diet, and exercise including blood glucose guidelines for exercising safely.    Portion Distortion:  -Group instruction provided by PowerPoint slides, verbal discussion, written materials, and food models to support subject matter. The instructor gives an explanation of serving size versus portion size, changes in portions sizes over the last 20 years, and what consists of a serving from each food group.   CARDIAC REHAB PHASE II EXERCISE from 08/14/2017 in [place]  Date  07/15/17  Educator  RD  Instruction Review Code  2- meets goals/outcomes      Stress Management:  -Group instruction provided by verbal instruction, video, and written materials to support subject matter.  Instructors review role of stress in heart disease and how to cope with stress positively.     Exercising on Your Own:  -Group instruction provided by verbal instruction, power point, and written materials to support subject.  Instructors discuss benefits of exercise, components of exercise, frequency and intensity of exercise, and end points for exercise.  Also discuss use of nitroglycerin and  activating EMS.  Review options of places to exercise outside of rehab.  Review guidelines for sex with heart disease.   CARDIAC REHAB PHASE II EXERCISE from 08/14/2017 in [place]  Date  08/05/17  Instruction Review Code  2- meets goals/outcomes      Cardiac Drugs I:  -Group instruction provided by verbal instruction and written materials to support subject.  Instructor reviews cardiac drug classes: antiplatelets, anticoagulants, beta blockers, and statins.  Instructor discusses reasons, side effects, and lifestyle considerations for each drug class.   CARDIAC REHAB PHASE II EXERCISE from 08/14/2017 in [place]  Date  08/12/17  Instruction Review Code  2- meets goals/outcomes      Cardiac Drugs II:  -Group instruction provided by verbal instruction and written materials to support subject.  Instructor reviews cardiac drug classes: angiotensin converting enzyme inhibitors (ACE-I), angiotensin II receptor blockers (ARBs), nitrates, and calcium channel blockers.  Instructor discusses reasons, side effects, and lifestyle considerations for each drug class.   CARDIAC REHAB PHASE II EXERCISE from 08/14/2017 in [place]  Date  07/08/17  Instruction Review Code  2- meets goals/outcomes      Anatomy and Physiology of the Circulatory System:  Group verbal and written instruction and models provide basic cardiac anatomy and physiology, with the coronary electrical and arterial systems. Review of: AMI, Angina, Valve disease, Heart Failure, Peripheral Artery Disease, Cardiac Arrhythmia, Pacemakers, and the ICD.   CARDIAC REHAB PHASE II EXERCISE from 08/14/2017 in [place]  Date  06/24/17  Instruction Review Code  2- meets goals/outcomes  Other Education:  -Group or individual verbal, written, or video instructions that support the educational goals of the  cardiac rehab program.   CARDIAC REHAB PHASE II EXERCISE from 08/14/2017 in [place]  Date  08/07/17 [Holiday Eating Survival Tips]  Educator  RD  Instruction Review Code  2- Demonstrated Understanding      Knowledge Questionnaire Score: Knowledge Questionnaire Score - 06/09/17 1055      Knowledge Questionnaire Score   Pre Score  17/24       Core Components/Risk Factors/Patient Goals at Admission: Personal Goals and Risk Factors at Admission - 06/09/17 0937      Core Components/Risk Factors/Patient Goals on Admission   Heart Failure  Yes    Intervention  Provide a combined exercise and nutrition program that is supplemented with education, support and counseling about heart failure. Directed toward relieving symptoms such as shortness of breath, decreased exercise tolerance, and extremity edema.    Expected Outcomes  Improve functional capacity of life;Short term: Attendance in program 2-3 days a week with increased exercise capacity. Reported lower sodium intake. Reported increased fruit and vegetable intake. Reports medication compliance.;Short term: Daily weights obtained and reported for increase. Utilizing diuretic protocols set by physician.;Long term: Adoption of self-care skills and reduction of barriers for early signs and symptoms recognition and intervention leading to self-care maintenance.    Lipids  Yes    Intervention  Provide education and support for participant on nutrition & aerobic/resistive exercise along with prescribed medications to achieve LDL 70mg , HDL >40mg .    Expected Outcomes  Short Term: Participant states understanding of desired cholesterol values and is compliant with medications prescribed. Participant is following exercise prescription and nutrition guidelines.;Long Term: Cholesterol controlled with medications as prescribed, with individualized exercise RX and with personalized nutrition plan. Value goals: LDL < 70mg ,  HDL > 40 mg.       Core Components/Risk Factors/Patient Goals Review:  Goals and Risk Factor Review    Row Name 07/05/17 1932 07/28/17 1345 08/28/17 1143         Core Components/Risk Factors/Patient Goals Review   Personal Goals Review  Weight Management/Obesity;Heart Failure;Lipids  Weight Management/Obesity;Heart Failure;Lipids  Weight Management/Obesity;Heart Failure;Lipids     Review  Pt is off to a good start toward his personal goals.  Pt is consitent with attendance to exercise and education classes.    Pt is consistent with attendance to exercise and education classes.  Pt maintain his weight loss since his surgery.  No current labs noted in epic for review.    Pt is consistent with attendance to exercise and education classes.  Pt maintain his weight loss since his surgery.  No current labs noted in epic for review.     Expected Outcomes  Pt will participate in Cardiac Rehab, nutrtion and lifestlye modifcation education to decrease overall risk factors.  Pt will participate in Cardiac Rehab, nutrition and lifestlye modifcation education to decrease overall risk factors.  Pt will participate in Cardiac Rehab, nutrition and lifestlye modifcation education to decrease overall risk factors.        Core Components/Risk Factors/Patient Goals at Discharge (Final Review):  Goals and Risk Factor Review - 08/28/17 1143      Core Components/Risk Factors/Patient Goals Review   Personal Goals Review  Weight Management/Obesity;Heart Failure;Lipids    Review    Pt is consistent with attendance to exercise and education classes.  Pt maintain his weight loss since his surgery.  No current labs noted  in epic for review.    Expected Outcomes  Pt will participate in Cardiac Rehab, nutrition and lifestlye modifcation education to decrease overall risk factors.       ITP Comments: ITP Comments    Row Name 06/09/17 0916 07/07/17 1532 07/31/17 0844 08/28/17 1143     ITP Comments  Medical Director,  Dr. Fransico Him  Medical Director, Dr. Fransico Him  Medical Director, Dr. Fransico Him  30 Day ITP Review, Patient is doing well with exercise and attendance at cardiac rehab       Comments: See ITP comment.Barnet Pall, RN,BSN 08/28/2017 12:16 PM

## 2017-08-31 ENCOUNTER — Encounter (HOSPITAL_COMMUNITY): Payer: Medicare Other

## 2017-08-31 ENCOUNTER — Encounter (HOSPITAL_COMMUNITY)
Admission: RE | Admit: 2017-08-31 | Discharge: 2017-08-31 | Disposition: A | Discharge status: Home / Self Care | Payer: Medicare Other | Source: Ambulatory Visit | Attending: Cardiology | Admitting: Cardiology

## 2017-08-31 DIAGNOSIS — Z48812 Encounter for surgical aftercare following surgery on the circulatory system: Secondary | ICD-10-CM | POA: Insufficient documentation

## 2017-08-31 DIAGNOSIS — Z951 Presence of aortocoronary bypass graft: Secondary | ICD-10-CM | POA: Diagnosis present

## 2017-08-31 DIAGNOSIS — I5022 Chronic systolic (congestive) heart failure: Secondary | ICD-10-CM

## 2017-09-02 ENCOUNTER — Encounter (HOSPITAL_COMMUNITY): Payer: Medicare Other

## 2017-09-02 ENCOUNTER — Encounter (HOSPITAL_COMMUNITY)
Admission: RE | Admit: 2017-09-02 | Discharge: 2017-09-02 | Disposition: A | Discharge status: Home / Self Care | Payer: Medicare Other | Source: Ambulatory Visit | Attending: Cardiology | Admitting: Cardiology

## 2017-09-02 DIAGNOSIS — I5022 Chronic systolic (congestive) heart failure: Secondary | ICD-10-CM

## 2017-09-02 DIAGNOSIS — Z48812 Encounter for surgical aftercare following surgery on the circulatory system: Secondary | ICD-10-CM | POA: Diagnosis not present

## 2017-09-02 DIAGNOSIS — Z951 Presence of aortocoronary bypass graft: Secondary | ICD-10-CM

## 2017-09-03 NOTE — Progress Notes (Signed)
Patient reported feeling fatigued after seeing Dr [name] this past Friday. Dr Irven Shelling office was called and notified. Dr Irven Shelling office talked to the patinet over the phone and discontinued his coreg.Will continue to monitor BP.Barnet Pall, RN,BSN 09/03/2017 11:25 AM

## 2017-09-04 ENCOUNTER — Encounter (HOSPITAL_COMMUNITY): Payer: Medicare Other

## 2017-09-04 ENCOUNTER — Encounter (HOSPITAL_COMMUNITY)
Admission: RE | Admit: 2017-09-04 | Discharge: 2017-09-04 | Disposition: A | Discharge status: Home / Self Care | Payer: Medicare Other | Source: Ambulatory Visit | Attending: Cardiology | Admitting: Cardiology

## 2017-09-04 DIAGNOSIS — I5022 Chronic systolic (congestive) heart failure: Secondary | ICD-10-CM

## 2017-09-04 DIAGNOSIS — Z951 Presence of aortocoronary bypass graft: Secondary | ICD-10-CM

## 2017-09-04 DIAGNOSIS — Z48812 Encounter for surgical aftercare following surgery on the circulatory system: Secondary | ICD-10-CM | POA: Diagnosis not present

## 2017-09-07 ENCOUNTER — Encounter (HOSPITAL_COMMUNITY): Payer: Medicare Other

## 2017-09-09 ENCOUNTER — Encounter (HOSPITAL_COMMUNITY): Payer: Medicare Other

## 2017-09-09 ENCOUNTER — Telehealth (HOSPITAL_COMMUNITY): Payer: Self-pay | Admitting: Internal Medicine

## 2017-09-11 ENCOUNTER — Ambulatory Visit: Payer: Medicare Other | Admitting: Cardiology

## 2017-09-11 ENCOUNTER — Encounter: Payer: Self-pay | Admitting: Cardiology

## 2017-09-11 ENCOUNTER — Encounter (HOSPITAL_COMMUNITY): Payer: Medicare Other

## 2017-09-11 ENCOUNTER — Telehealth (HOSPITAL_COMMUNITY): Payer: Self-pay | Admitting: *Deleted

## 2017-09-11 VITALS — BP 124/52 | HR 82 | Ht 67.0 in | Wt 174.0 lb

## 2017-09-11 DIAGNOSIS — E785 Hyperlipidemia, unspecified: Secondary | ICD-10-CM | POA: Diagnosis not present

## 2017-09-11 DIAGNOSIS — I25810 Atherosclerosis of coronary artery bypass graft(s) without angina pectoris: Secondary | ICD-10-CM | POA: Diagnosis not present

## 2017-09-11 DIAGNOSIS — I255 Ischemic cardiomyopathy: Secondary | ICD-10-CM

## 2017-09-11 NOTE — Progress Notes (Signed)
Electrophysiology Office Note   Date:  09/11/2017   ID:  Luis [name], DOB 02/05/1946, MRN [id number]  PCP:  [name], MD  Cardiologist:  [name] Primary Electrophysiologist:  [name], MD    Chief Complaint  Patient presents with  . Cardiomyopathy     History of Present Illness: Luis [name] is a 71 y.o. male who is being seen today for the evaluation of CHF at the request of [name], MD. Presenting today for electrophysiology evaluation.  He had coronary angiography on 03/25/17 due to an abnormal nuclear stress test.  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  He underwent emergent CABG with a LIMA to the LAD saphenous grain to the OM.  He was discharged home but readmitted due to decompensated heart failure.  He was initially on Coreg and losartan which were both discontinued due to hypotension.  He is able to walk up to 2 miles a day without dizziness or chest pain.    Today, he denies he currently feels well without major symptoms.  He does have chest pain that he attributes to symptoms of palpitations, chest pain, shortness of breath, orthopnea, PND, lower extremity edema, claudication, dizziness, presyncope, syncope, bleeding, or neurologic sequela. The patient is tolerating medications without difficulties.  His sternotomy.  He otherwise is able to do most of his daily activities without issue.   Past Medical History:  Diagnosis Date  . Chronic kidney disease    kidney stones; noted per H&P per Dr [name] 02/06/2015 chronic kidney disease stage II  . Diabetes mellitus without complication ([place])    per H&P Dr [name] noted to be prediabetic 02/06/2015  . Dyslipidemia   . History of hepatomegaly    with fatty liver discussed per H&P per Dr [name] 02/06/2015 secondary to abd ultrasound   . Sleep apnea    AHI 17 on sleep study 08/23/2013 pt states was given CPAP machine but sent it back - does not use   . Vitamin D deficiency     as noted per H&P per Dr [name] 02/06/2015   Past Surgical History:  Procedure Laterality Date  . ASD REPAIR N/A 03/25/2017   Procedure: ATRIAL SEPTAL DEFECT (ASD) REPAIR;  Surgeon: [name], MD;  Location: [place];  Service: Open Heart Surgery;  Laterality: N/A;  . CORONARY ARTERY BYPASS GRAFT N/A 03/25/2017   Procedure: CORONARY ARTERY BYPASS GRAFTING (CABG) x 2 using left internal mammary artery and right greater saphenous leg vein using endosciope.;  Surgeon: [name], MD;  Location: [place];  Service: Open Heart Surgery;  Laterality: N/A;  . CYSTOSCOPY W/ RETROGRADES Left 03/26/2015   Procedure: CYSTOSCOPY WITH RETROGRADE PYELOGRAM;  Surgeon: [name], MD;  Location: [facility];  Service: Urology;  Laterality: Left;  . LEFT HEART CATH AND CORONARY ANGIOGRAPHY N/A 03/25/2017   Procedure: Left Heart Cath and Coronary Angiography;  Surgeon: [name], MD;  Location: [place] [facility];  Service: Cardiovascular;  Laterality: N/A;  . NO PAST SURGERIES    . PLACEMENT OF IMPELLA LEFT VENTRICULAR ASSIST DEVICE  03/25/2017   Procedure: PLACEMENT OF IMPELLA LEFT VENTRICULAR ASSIST DEVICE;  Surgeon: [name], MD;  Location: [place];  Service: Open Heart Surgery;;  . REMOVAL OF IMPELLA LEFT VENTRICULAR ASSIST DEVICE N/A 03/31/2017   Procedure: REMOVAL OF IMPELLA LEFT VENTRICULAR ASSIST DEVICE;  Surgeon: [name], MD;  Location: [place];  Service: Open Heart Surgery;  Laterality: N/A;  .  ROBOT ASSISTED PYELOPLASTY Left 03/26/2015   Procedure: ROBOTIC ASSISTED PYELOPLASTY WITH STENT PLACEMENT, REMOVAL OF STONE AND CYST DECORTICATION;  Surgeon: [name], MD;  Location: [facility];  Service: Urology;  Laterality: Left;  . TEE WITHOUT CARDIOVERSION N/A 03/25/2017   Procedure: TRANSESOPHAGEAL ECHOCARDIOGRAM (TEE);  Surgeon: [name], [name], MD;  Location: [place];  Service: Open Heart Surgery;  Laterality: N/A;  . TEE WITHOUT CARDIOVERSION N/A 03/31/2017   Procedure: TRANSESOPHAGEAL  ECHOCARDIOGRAM (TEE);  Surgeon: [name], [name], MD;  Location: [place];  Service: Open Heart Surgery;  Laterality: N/A;     Current Outpatient Medications  Medication Sig Dispense Refill  . carvedilol (COREG) 6.25 MG tablet Take 6.25 mg by mouth 2 (two) times daily.  0  . clopidogrel (PLAVIX) 75 MG tablet Take 1 tablet (75 mg total) by mouth daily. 90 tablet 1  . Coenzyme Q10 (CO Q 10) 100 MG CAPS Take by mouth.    . escitalopram (LEXAPRO) 10 MG tablet Take 10 mg by mouth daily.  1  . metoprolol succinate (TOPROL-XL) 25 MG 24 hr tablet TAKE  1/2 TABLET EVERY EVENING  3  . rosuvastatin (CRESTOR) 40 MG tablet Take 40 mg by mouth daily.  4  . sacubitril-valsartan (ENTRESTO) 24-26 MG Take 1 tablet by mouth 2 (two) times daily.     No current facility-administered medications for this visit.     Allergies:   No known allergies   Social History:  The patient  reports that  has never smoked. he has never used smokeless tobacco. He reports that he does not drink alcohol or use drugs.   Family History:  The patient's family history includes Healthy in his brother, father, mother, and sister.    ROS:  Please see the history of present illness.   Otherwise, review of systems is positive for back pain.   All other systems are reviewed and negative.    PHYSICAL EXAM: VS:  BP (!) 124/52   Pulse 82   Ht 5\' 7"  (1.702 m)   Wt 174 lb (78.9 kg)   BMI 27.25 kg/m  , BMI Body mass index is 27.25 kg/m. GEN: Well nourished, well developed, in no acute distress  HEENT: normal  Neck: no JVD, carotid bruits, or masses Cardiac: RRR; no murmurs, rubs, or gallops,no edema  Respiratory:  clear to auscultation bilaterally, normal work of breathing GI: soft, nontender, nondistended, + BS MS: no deformity or atrophy  Skin: warm and dry Neuro:  Strength and sensation are intact Psych: euthymic mood, full affect  EKG:  EKG is ordered today. Personal review of the ekg ordered shows SR, RAD, rate  82  Recent Labs: 04/07/2017: ALT 20 04/09/2017: Magnesium 2.3 04/16/2017: Hemoglobin 9.9; Platelets 414 04/17/2017: B Natriuretic Peptide 494.8; BUN 12; Creatinine, Ser 1.19; Potassium 4.3; Sodium 134    Lipid Panel  No results found for: CHOL, TRIG, HDL, CHOLHDL, VLDL, LDLCALC, LDLDIRECT   Wt Readings from Last 3 Encounters:  09/11/17 174 lb (78.9 kg)  06/09/17 167 lb 15.9 oz (76.2 kg)  05/27/17 163 lb 3.2 oz (74 kg)      Other studies Reviewed: Additional studies/ records that were reviewed today include: TTE 08/13/17 Review of the above records today demonstrates:  LV cavity normal size.  Ejection fraction 20-25%.  Akinesis in the LAD/diagonal territory Grade 3 diastolic dysfunction. Moderately dilated left atrium mild tricuspid regurgitation   ASSESSMENT AND PLAN:  1.  Chronic systolic heart failure due to ischemic cardiomyopathy: Recently started on  Entresto and carvedilol.  Discussion with both him and his son, they would like to wait to see if the [facility] and carvedilol Edgel Degnan help to increase his heart.  Edrees Valent order a transthoracic echo in 3 months and see him back after the echo has been performed.  I did discuss with him the option of ICD therapy.  Risks and benefits include bleeding, tamponade, infection, pneumothorax, lead dislodgment, death, MI, and stroke.  The patient and the son understand these risks and if his ejection fraction does not improve, Jynesis Nakamura plan for ICD therapy.  2.  Hyperlipidemia: Continue Crestor  3.  Coronary artery disease: Status post CABG.  Patient currently not having chest pain concerning for coronary disease.    Current medicines are reviewed at length with the patient today.   The patient does not have concerns regarding his medicines.  The following changes were made today:  none  Labs/ tests ordered today include:  Orders Placed This Encounter  Procedures  . EKG 12-Lead     Disposition:   FU with Miia Blanks 3 months  Signed, Laylynn Campanella  Meredith Leeds, MD  09/11/2017 4:03 PM     [place]  [id number] [phone] (office) [phone] (fax)

## 2017-09-11 NOTE — Patient Instructions (Addendum)
Medication Instructions:  Your physician recommends that you continue on your current medications as directed. Please refer to the Current Medication list given to you today.  * If you need a refill on your cardiac medications before your next appointment, please call your pharmacy. *  Labwork: None ordered  Testing/Procedures: Your physician has requested that you have an echocardiogram in 3 months -- with Dr. [name] -- before you follow up with Dr. Curt Bears. Echocardiography is a painless test that uses sound waves to create images of your heart. It provides your doctor with information about the size and shape of your heart and how well your heart's chambers and valves are working. This procedure takes approximately one hour. There are no restrictions for this procedure.  Follow-Up: Your physician recommends that you schedule a follow-up appointment in: 3 months with Dr. Curt Bears (after you have completed your echocardiogram).  Thank you for choosing [facility]!!   Trinidad Curet, RN [phone]

## 2017-09-14 ENCOUNTER — Encounter (HOSPITAL_COMMUNITY): Payer: Medicare Other

## 2017-09-14 ENCOUNTER — Encounter (HOSPITAL_COMMUNITY)
Admission: RE | Admit: 2017-09-14 | Discharge: 2017-09-14 | Disposition: A | Discharge status: Home / Self Care | Payer: Medicare Other | Source: Ambulatory Visit | Attending: Cardiology | Admitting: Cardiology

## 2017-09-14 DIAGNOSIS — Z951 Presence of aortocoronary bypass graft: Secondary | ICD-10-CM

## 2017-09-14 DIAGNOSIS — I5022 Chronic systolic (congestive) heart failure: Secondary | ICD-10-CM

## 2017-09-14 NOTE — Progress Notes (Signed)
Incomplete Session Note  Patient Details  Name: Luis [name] MRN: [id number] Date of Birth: 1946/06/02 Referring Provider:     CARDIAC REHAB PHASE II ORIENTATION from 06/09/2017 in [place]  Referring Provider  Kela Millin MD      Wanda Mcelhinny did not complete his rehab session.  Siting blood pressure 88/60. Heart rate 70. Standing blood pressure 98/60. Telemetry rhythm Sinus. Elsworth said he did not come to exercise last week because he felt weak. Upon review of Mr Brazier's medication list, Mrs Ursin said that [name] is taking coreg twice a day and metoprolol at night. I called Dr Earnie Larsson to notify about Mr [name]'s low blood pressure and to notify him that [name] is taking both coreg and metoprolol. Dr Virgina Jock told Mr Mohler to stop taking the metoprolol over the phone. Patient asymptomatic. Airik plans to return to exercise on Wednesday. [place] said he will bring his medication bottles with him when he returns to exercise.Barnet Pall, RN,BSN 09/14/2017 2:00 PM

## 2017-09-16 ENCOUNTER — Encounter (HOSPITAL_COMMUNITY)
Admission: RE | Admit: 2017-09-16 | Discharge: 2017-09-16 | Disposition: A | Discharge status: Home / Self Care | Payer: Medicare Other | Source: Ambulatory Visit | Attending: Cardiology | Admitting: Cardiology

## 2017-09-16 ENCOUNTER — Encounter (HOSPITAL_COMMUNITY): Payer: Medicare Other

## 2017-09-16 DIAGNOSIS — Z48812 Encounter for surgical aftercare following surgery on the circulatory system: Secondary | ICD-10-CM | POA: Diagnosis not present

## 2017-09-16 NOTE — Progress Notes (Signed)
OUTPATIENT CARDIAC REHAB  Primary Cardiologist:  Dr. [name]   Pt and wife arrived at cardiac rehab with home medication bottles. Medication list reconciled. Pt and wife states he discontinued metoprolol Monday as directed.  Pt states weakness persists however slightly improved. Pt tolerated exercise at cardiac rehab without difficulty. Will continue to monitor.  Andi Hence, RN, BSN Cardiac Pulmonary Rehab 09/16/17  3:38 PM

## 2017-09-18 ENCOUNTER — Encounter (HOSPITAL_COMMUNITY)
Admission: RE | Admit: 2017-09-18 | Discharge: 2017-09-18 | Disposition: A | Discharge status: Home / Self Care | Payer: Medicare Other | Source: Ambulatory Visit | Attending: Cardiology | Admitting: Cardiology

## 2017-09-18 DIAGNOSIS — Z951 Presence of aortocoronary bypass graft: Secondary | ICD-10-CM

## 2017-09-18 DIAGNOSIS — I5022 Chronic systolic (congestive) heart failure: Secondary | ICD-10-CM

## 2017-09-18 DIAGNOSIS — Z48812 Encounter for surgical aftercare following surgery on the circulatory system: Secondary | ICD-10-CM | POA: Diagnosis not present

## 2017-09-23 ENCOUNTER — Ambulatory Visit
Admission: RE | Admit: 2017-09-23 | Discharge: 2017-09-23 | Disposition: A | Discharge status: Home / Self Care | Payer: Medicare Other | Source: Ambulatory Visit | Attending: Internal Medicine | Admitting: Internal Medicine

## 2017-09-23 ENCOUNTER — Other Ambulatory Visit: Payer: Self-pay | Admitting: Internal Medicine

## 2017-09-23 DIAGNOSIS — R059 Cough, unspecified: Secondary | ICD-10-CM

## 2017-09-23 DIAGNOSIS — R05 Cough: Secondary | ICD-10-CM

## 2017-09-24 ENCOUNTER — Encounter (HOSPITAL_COMMUNITY): Payer: Self-pay | Admitting: *Deleted

## 2017-09-24 DIAGNOSIS — Z951 Presence of aortocoronary bypass graft: Secondary | ICD-10-CM

## 2017-09-24 DIAGNOSIS — I5022 Chronic systolic (congestive) heart failure: Secondary | ICD-10-CM

## 2017-09-24 NOTE — Progress Notes (Signed)
Cardiac Individual Treatment Plan  Patient Details  Name: Luis [name] MRN: [id number] Date of Birth: January 25, 1946 Referring Provider:     CARDIAC REHAB PHASE II ORIENTATION from 06/09/2017 in [place]  Referring Provider  Kela Millin MD      Initial Encounter Date:    CARDIAC REHAB PHASE II ORIENTATION from 06/09/2017 in [place]  Date  06/09/17  Referring Provider  Kela Millin MD      Visit Diagnosis: Chronic systolic congestive heart failure ([place])  S/P CABG x 2  Patient's Home Medications on Admission:  Current Outpatient Medications:  .  aspirin 81 MG chewable tablet, Chew 81 mg by mouth daily., Disp: , Rfl:  .  carvedilol (COREG) 6.25 MG tablet, Take 6.25 mg by mouth 2 (two) times daily., Disp: , Rfl: 0 .  clopidogrel (PLAVIX) 75 MG tablet, Take 1 tablet (75 mg total) by mouth daily., Disp: 90 tablet, Rfl: 1 .  Coenzyme Q10 (CO Q 10) 100 MG CAPS, Take by mouth., Disp: , Rfl:  .  escitalopram (LEXAPRO) 10 MG tablet, Take 10 mg by mouth daily., Disp: , Rfl: 1 .  furosemide (LASIX) 20 MG tablet, Take 20 mg by mouth daily as needed., Disp: , Rfl:  .  Multiple Vitamin (MULTIVITAMIN) capsule, Take 1 capsule by mouth daily., Disp: , Rfl:  .  psyllium (METAMUCIL) 58.6 % powder, Take 1 packet by mouth 3 (three) times daily., Disp: , Rfl:  .  rosuvastatin (CRESTOR) 40 MG tablet, Take 40 mg by mouth daily., Disp: , Rfl: 4 .  sacubitril-valsartan (ENTRESTO) 24-26 MG, Take 1 tablet by mouth 2 (two) times daily., Disp: , Rfl:   Past Medical History: Past Medical History:  Diagnosis Date  . Chronic kidney disease    kidney stones; noted per H&P per Dr [name] 02/06/2015 chronic kidney disease stage II  . Diabetes mellitus without complication ([place])    per H&P Dr [name] noted to be prediabetic 02/06/2015  . Dyslipidemia   . History of hepatomegaly    with fatty liver discussed per H&P per Dr [name] 02/06/2015  secondary to abd ultrasound   . Sleep apnea    AHI 17 on sleep study 08/23/2013 pt states was given CPAP machine but sent it back - does not use   . Vitamin D deficiency    as noted per H&P per Dr [name] 02/06/2015    Tobacco Use: Social History   Tobacco Use  Smoking Status Never Smoker  Smokeless Tobacco Never Used    Labs: Recent Review Flowsheet Data    Labs for ITP Cardiac and Pulmonary Rehab Latest Ref Rng & Units 04/06/2017 04/07/2017 04/07/2017 04/08/2017 04/09/2017   Hemoglobin A1c 4.8 - 5.6 % - - - - -   PHART 7.350 - 7.450 - - - - -   PCO2ART 32.0 - 48.0 mmHg - - - - -   HCO3 20.0 - 28.0 mmol/L - - - - -   TCO2 0 - 100 mmol/L - - - - -   ACIDBASEDEF 0.0 - 2.0 mmol/L - - - - -   O2SAT % 54.4 49.3 58.3 56.6 64.4      Capillary Blood Glucose: Lab Results  Component Value Date   GLUCAP 108 (H) 04/10/2017   GLUCAP 107 (H) 04/10/2017   GLUCAP 131 (H) 04/09/2017   GLUCAP 98 04/09/2017   GLUCAP 118 (H) 04/09/2017     Exercise Target Goals:    Exercise Program  Goal: Individual exercise prescription set with THRR, safety & activity barriers. Participant demonstrates ability to understand and report RPE using BORG scale, to self-measure pulse accurately, and to acknowledge the importance of the exercise prescription.  Exercise Prescription Goal: Starting with aerobic activity 30 plus minutes a day, 3 days per week for initial exercise prescription. Provide home exercise prescription and guidelines that participant acknowledges understanding prior to discharge.  Activity Barriers & Risk Stratification:   6 Minute Walk:   Oxygen Initial Assessment:   Oxygen Re-Evaluation:   Oxygen Discharge (Final Oxygen Re-Evaluation):   Initial Exercise Prescription:   Perform Capillary Blood Glucose checks as needed.  Exercise Prescription Changes: Exercise Prescription Changes    Row Name 08/12/17 1504 08/24/17 1510 09/04/17 1401         Response to Exercise    Blood Pressure (Admit)  120/70  108/60  106/62     Blood Pressure (Exercise)  126/70  106/60  116/62     Blood Pressure (Exit)  100/60  110/73  100/60     Heart Rate (Admit)  98 bpm  89 bpm  78 bpm     Heart Rate (Exercise)  133 bpm  131 bpm  115 bpm     Heart Rate (Exit)  100 bpm  85 bpm  74 bpm     Rating of Perceived Exertion (Exercise)  12  12  12      Symptoms  none  none  fatigue     Duration  Continue with 30 min of aerobic exercise without signs/symptoms of physical distress.  Continue with 30 min of aerobic exercise without signs/symptoms of physical distress.  Continue with 30 min of aerobic exercise without signs/symptoms of physical distress.     Intensity  THRR unchanged  THRR unchanged  THRR unchanged       Progression   Progression  Continue to progress workloads to maintain intensity without signs/symptoms of physical distress.  Continue to progress workloads to maintain intensity without signs/symptoms of physical distress.  Continue to progress workloads to maintain intensity without signs/symptoms of physical distress.     Average METs  4.7  4.7  4.4       Resistance Training   Training Prescription  - relaxation  Yes  Yes     Weight  -  5lbs  4lbs     Reps  -  10-15  10-15     Time  -  10 Minutes  10 Minutes       Recumbant Bike   Level  4 upright scifit bike  6 upright scifit bike  6 upright scifit bike     Minutes  10  10  10      METs  5.6  5.8  5.1       NuStep   Level  4  5  5      SPM  100  85  85     Minutes  10  10  10      METs  4.6  4.6  4.4       Track   Laps  16  16  16      Minutes  10  10  10      METs  3.79  3.79  3.79       Home Exercise Plan   Plans to continue exercise at  Home (comment) walking  Home (comment) walking  Home (comment) walking     Frequency  Add 2 additional days to program exercise  sessions.  Add 2 additional days to program exercise sessions.  Add 2 additional days to program exercise sessions.     Initial Home Exercises  Provided  07/01/17  07/01/17  07/01/17        Exercise Comments: Exercise Comments    Row Name 07/28/17 1647 08/25/17 1517 09/17/17 1247       Exercise Comments  Reviewed METs and goals. Pt is tolerating exercise very well; will continue to monitor pt's progress and activity levels.   Reviewed METs and goals. Pt is tolerating exercise very well; will continue to monitor pt's progress and activity levels.   Pt plans to walk for exercise post discharge from cardiac rehab. Pt will walk 5-7x/week for 30 minutes. Discussed temperature/emergency precautions and indoor options. Pt voiced understanding.         Exercise Goals and Review:   Exercise Goals Re-Evaluation : Exercise Goals Re-Evaluation    Row Name 07/28/17 1648 08/25/17 1515           Exercise Goal Re-Evaluation   Exercise Goals Review  Increase Physical Activity;Able to understand and use rate of perceived exertion (RPE) scale;Knowledge and understanding of Target Heart Rate Range (THRR);Understanding of Exercise Prescription;Increase Strength and Stamina  Increase Physical Activity;Able to understand and use rate of perceived exertion (RPE) scale;Knowledge and understanding of Target Heart Rate Range (THRR);Understanding of Exercise Prescription;Increase Strength and Stamina      Comments  Pt is making great progress in cardiac rehab. Pt is currently averaging 5.6 METs on the Nustep machine at a load of 4.  Pt tolerates WL increases very well. Pt is up to a level 6 on scifit bike. Pt will be introduced to weights on 08/26/17.      Expected Outcomes  Pt will continue to improve in cardiorespiratory fitness  Pt will continue to improve in cardiorespiratory fitness and improve on UE/LE strength.          Discharge Exercise Prescription (Final Exercise Prescription Changes): Exercise Prescription Changes - 09/04/17 1401      Response to Exercise   Blood Pressure (Admit)  106/62    Blood Pressure (Exercise)  116/62    Blood  Pressure (Exit)  100/60    Heart Rate (Admit)  78 bpm    Heart Rate (Exercise)  115 bpm    Heart Rate (Exit)  74 bpm    Rating of Perceived Exertion (Exercise)  12    Symptoms  fatigue    Duration  Continue with 30 min of aerobic exercise without signs/symptoms of physical distress.    Intensity  THRR unchanged      Progression   Progression  Continue to progress workloads to maintain intensity without signs/symptoms of physical distress.    Average METs  4.4      Resistance Training   Training Prescription  Yes    Weight  4lbs    Reps  10-15    Time  10 Minutes      Recumbant Bike   Level  6 upright scifit bike    Minutes  10    METs  5.1      NuStep   Level  5    SPM  85    Minutes  10    METs  4.4      Track   Laps  16    Minutes  10    METs  3.79      Home Exercise Plan   Plans to continue exercise at  Home (comment) walking    Frequency  Add 2 additional days to program exercise sessions.    Initial Home Exercises Provided  07/01/17       Nutrition:  Target Goals: Understanding of nutrition guidelines, daily intake of sodium 1500mg , cholesterol 200mg , calories 30% from fat and 7% or less from saturated fats, daily to have 5 or more servings of fruits and vegetables.  Biometrics:    Nutrition Therapy Plan and Nutrition Goals:   Nutrition Discharge: Nutrition Scores:   Nutrition Goals Re-Evaluation:   Nutrition Goals Re-Evaluation:   Nutrition Goals Discharge (Final Nutrition Goals Re-Evaluation):   Psychosocial: Target Goals: Acknowledge presence or absence of significant depression and/or stress, maximize coping skills, provide positive support system. Participant is able to verbalize types and ability to use techniques and skills needed for reducing stress and depression.  Initial Review & Psychosocial Screening:   Quality of Life Scores:   PHQ-9: Recent Review Flowsheet Data    There is no flowsheet data to display.      Interpretation of Total Score  Total Score Depression Severity:  1-4 = Minimal depression, 5-9 = Mild depression, 10-14 = Moderate depression, 15-19 = Moderately severe depression, 20-27 = Severe depression   Psychosocial Evaluation and Intervention: Psychosocial Evaluation - 07/28/17 1344      Psychosocial Evaluation & Interventions   Interventions  Encouraged to exercise with the program and follow exercise prescription    Comments  Pt with supportive family, denies any psychosocial needs at this time.    Expected Outcomes   will continue to display positive attitude with healthy coping skills.  Pt will contribute to his family and the communty in a meaningful way.    Continue Psychosocial Services   No Follow up required       Psychosocial Re-Evaluation: Psychosocial Re-Evaluation    [place] Name 07/28/17 1344 08/28/17 1143 09/24/17 1644         Psychosocial Re-Evaluation   Current issues with  None Identified  None Identified  None Identified     Comments  Pt with supportive family, denies any pyschosocial needs at this time.  Pt with supportive family, denies any pyschosocial needs at this time.  Pt with supportive family, denies any pyschosocial needs at this time.     Expected Outcomes  Pt will continue to display positive attitude with healthy coping skills.  Pt will contribute to his family and the communty in a meaningful way.  Pt will continue to display positive attitude with healthy coping skills.  Pt will contribute to his family and the communty in a meaningful way.  Pt will continue to display positive attitude with healthy coping skills.  Pt will contribute to his family and the communty in a meaningful way.     Interventions  Encouraged to attend Cardiac Rehabilitation for the exercise  Encouraged to attend Cardiac Rehabilitation for the exercise  Encouraged to attend Cardiac Rehabilitation for the exercise     Continue Psychosocial Services   No Follow up required  No  Follow up required  No Follow up required        Psychosocial Discharge (Final Psychosocial Re-Evaluation): Psychosocial Re-Evaluation - 09/24/17 1644      Psychosocial Re-Evaluation   Current issues with  None Identified    Comments  Pt with supportive family, denies any pyschosocial needs at this time.    Expected Outcomes  Pt will continue to display positive attitude with healthy coping skills.  Pt will contribute to his family  and the communty in a meaningful way.    Interventions  Encouraged to attend Cardiac Rehabilitation for the exercise    Continue Psychosocial Services   No Follow up required       Vocational Rehabilitation: Provide vocational rehab assistance to qualifying candidates.   Vocational Rehab Evaluation & Intervention:   Education: Education Goals: Education classes will be provided on a weekly basis, covering required topics. Participant will state understanding/return demonstration of topics presented.  Learning Barriers/Preferences:   Education Topics: Count Your Pulse:  -Group instruction provided by verbal instruction, demonstration, patient participation and written materials to support subject.  Instructors address importance of being able to find your pulse and how to count your pulse when at home without a heart monitor.  Patients get hands on experience counting their pulse with staff help and individually.   CARDIAC REHAB PHASE II EXERCISE from 09/04/2017 in [place]  Date  07/31/17  Instruction Review Code  2- meets goals/outcomes      Heart Attack, Angina, and Risk Factor Modification:  -Group instruction provided by verbal instruction, video, and written materials to support subject.  Instructors address signs and symptoms of angina and heart attacks.    Also discuss risk factors for heart disease and how to make changes to improve heart health risk factors.   CARDIAC REHAB PHASE II EXERCISE from 09/04/2017 in  [place]  Date  06/17/17  Instruction Review Code  2- meets goals/outcomes      Functional Fitness:  -Group instruction provided by verbal instruction, demonstration, patient participation, and written materials to support subject.  Instructors address safety measures for doing things around the house.  Discuss how to get up and down off the floor, how to pick things up properly, how to safely get out of a chair without assistance, and balance training.   CARDIAC REHAB PHASE II EXERCISE from 09/04/2017 in [place]  Date  08/14/17  Instruction Review Code  2- meets goals/outcomes      Meditation and Mindfulness:  -Group instruction provided by verbal instruction, patient participation, and written materials to support subject.  Instructor addresses importance of mindfulness and meditation practice to help reduce stress and improve awareness.  Instructor also leads participants through a meditation exercise.    Stretching for Flexibility and Mobility:  -Group instruction provided by verbal instruction, patient participation, and written materials to support subject.  Instructors lead participants through series of stretches that are designed to increase flexibility thus improving mobility.  These stretches are additional exercise for major muscle groups that are typically performed during regular warm up and cool down.   Hands Only CPR:  -Group verbal, video, and participation provides a basic overview of AHA guidelines for community CPR. Role-play of emergencies allow participants the opportunity to practice calling for help and chest compression technique with discussion of AED use.   Hypertension: -Group verbal and written instruction that provides a basic overview of hypertension including the most recent diagnostic guidelines, risk factor reduction with self-care instructions and medication management.   CARDIAC REHAB  PHASE II EXERCISE from 09/04/2017 in [place]  Date  09/04/17  Instruction Review Code  2- meets goals/outcomes       Nutrition I class: Heart Healthy Eating:  -Group instruction provided by PowerPoint slides, verbal discussion, and written materials to support subject matter. The instructor gives an explanation and review of the Therapeutic Lifestyle Changes  diet recommendations, which includes a discussion on lipid goals, dietary fat, sodium, fiber, plant stanol/sterol esters, sugar, and the components of a well-balanced, healthy diet.   CARDIAC REHAB PHASE II EXERCISE from 09/04/2017 in [place]  Date  07/13/17  Educator  RD  Instruction Review Code  Not applicable      Nutrition II class: Lifestyle Skills:  -Group instruction provided by PowerPoint slides, verbal discussion, and written materials to support subject matter. The instructor gives an explanation and review of label reading, grocery shopping for heart health, heart healthy recipe modifications, and ways to make healthier choices when eating out.   CARDIAC REHAB PHASE II EXERCISE from 09/04/2017 in [place]  Date  07/13/17  Educator  RD  Instruction Review Code  Not applicable      Diabetes Question & Answer:  -Group instruction provided by PowerPoint slides, verbal discussion, and written materials to support subject matter. The instructor gives an explanation and review of diabetes co-morbidities, pre- and post-prandial blood glucose goals, pre-exercise blood glucose goals, signs, symptoms, and treatment of hypoglycemia and hyperglycemia, and foot care basics.   Diabetes Blitz:  -Group instruction provided by PowerPoint slides, verbal discussion, and written materials to support subject matter. The instructor gives an explanation and review of the physiology behind type 1 and type 2 diabetes, diabetes medications and  rational behind using different medications, pre- and post-prandial blood glucose recommendations and Hemoglobin A1c goals, diabetes diet, and exercise including blood glucose guidelines for exercising safely.    Portion Distortion:  -Group instruction provided by PowerPoint slides, verbal discussion, written materials, and food models to support subject matter. The instructor gives an explanation of serving size versus portion size, changes in portions sizes over the last 20 years, and what consists of a serving from each food group.   CARDIAC REHAB PHASE II EXERCISE from 09/04/2017 in [place]  Date  07/15/17  Educator  RD  Instruction Review Code  2- meets goals/outcomes      Stress Management:  -Group instruction provided by verbal instruction, video, and written materials to support subject matter.  Instructors review role of stress in heart disease and how to cope with stress positively.     Exercising on Your Own:  -Group instruction provided by verbal instruction, power point, and written materials to support subject.  Instructors discuss benefits of exercise, components of exercise, frequency and intensity of exercise, and end points for exercise.  Also discuss use of nitroglycerin and activating EMS.  Review options of places to exercise outside of rehab.  Review guidelines for sex with heart disease.   CARDIAC REHAB PHASE II EXERCISE from 09/04/2017 in [place]  Date  08/05/17  Instruction Review Code  2- meets goals/outcomes      Cardiac Drugs I:  -Group instruction provided by verbal instruction and written materials to support subject.  Instructor reviews cardiac drug classes: antiplatelets, anticoagulants, beta blockers, and statins.  Instructor discusses reasons, side effects, and lifestyle considerations for each drug class.   CARDIAC REHAB PHASE II EXERCISE from 09/04/2017 in [place]  Date  08/12/17  Instruction Review Code  2- meets goals/outcomes      Cardiac Drugs II:  -Group instruction provided by verbal instruction and written materials to support subject.  Instructor reviews cardiac drug classes: angiotensin converting enzyme inhibitors (ACE-I), angiotensin II receptor blockers (ARBs), nitrates, and calcium  channel blockers.  Instructor discusses reasons, side effects, and lifestyle considerations for each drug class.   CARDIAC REHAB PHASE II EXERCISE from 09/04/2017 in [place]  Date  07/08/17  Instruction Review Code  2- meets goals/outcomes      Anatomy and Physiology of the Circulatory System:  Group verbal and written instruction and models provide basic cardiac anatomy and physiology, with the coronary electrical and arterial systems. Review of: AMI, Angina, Valve disease, Heart Failure, Peripheral Artery Disease, Cardiac Arrhythmia, Pacemakers, and the ICD.   CARDIAC REHAB PHASE II EXERCISE from 09/04/2017 in [place]  Date  06/24/17  Instruction Review Code  2- meets goals/outcomes      Other Education:  -Group or individual verbal, written, or video instructions that support the educational goals of the cardiac rehab program.   CARDIAC REHAB PHASE II EXERCISE from 09/04/2017 in [place]  Date  08/07/17 [Holiday Eating Survival Tips]  Educator  RD  Instruction Review Code  2- Demonstrated Understanding      Knowledge Questionnaire Score:   Core Components/Risk Factors/Patient Goals at Admission:   Core Components/Risk Factors/Patient Goals Review:  Goals and Risk Factor Review    Row Name 07/28/17 1345 08/28/17 1143 09/24/17 1644         Core Components/Risk Factors/Patient Goals Review   Personal Goals Review  Weight Management/Obesity;Heart Failure;Lipids  Weight Management/Obesity;Heart Failure;Lipids  Weight  Management/Obesity;Heart Failure;Lipids     Review    Pt is consistent with attendance to exercise and education classes.  Pt maintain his weight loss since his surgery.  No current labs noted in epic for review.    Pt is consistent with attendance to exercise and education classes.  Pt maintain his weight loss since his surgery.  No current labs noted in epic for review.    Pt is consistent with attendance to exercise and education classes.  Pt maintain his weight loss since his surgery.  No current labs noted in epic for review.     Expected Outcomes  Pt will participate in Cardiac Rehab, nutrition and lifestlye modifcation education to decrease overall risk factors.  Pt will participate in Cardiac Rehab, nutrition and lifestlye modifcation education to decrease overall risk factors.  Pt will participate in Cardiac Rehab, nutrition and lifestlye modifcation education to decrease overall risk factors.        Core Components/Risk Factors/Patient Goals at Discharge (Final Review):  Goals and Risk Factor Review - 09/24/17 1644      Core Components/Risk Factors/Patient Goals Review   Personal Goals Review  Weight Management/Obesity;Heart Failure;Lipids    Review    Pt is consistent with attendance to exercise and education classes.  Pt maintain his weight loss since his surgery.  No current labs noted in epic for review.    Expected Outcomes  Pt will participate in Cardiac Rehab, nutrition and lifestlye modifcation education to decrease overall risk factors.       ITP Comments: ITP Comments    Row Name 07/31/17 0844 08/28/17 1143 09/24/17 1644       ITP Comments  Medical Director, Dr. Fransico Him  30 Day ITP Review, Patient is doing well with exercise and attendance at cardiac rehab  30 Day ITP Review, Patient is doing well with exercise and attendance at cardiac rehab        Comments: See ITP comment.Barnet Pall, RN,BSN 09/24/2017 4:46 PM

## 2017-09-25 ENCOUNTER — Telehealth (HOSPITAL_COMMUNITY): Payer: Self-pay | Admitting: *Deleted

## 2017-10-01 ENCOUNTER — Encounter (HOSPITAL_COMMUNITY): Payer: Self-pay | Admitting: *Deleted

## 2017-10-01 ENCOUNTER — Telehealth (HOSPITAL_COMMUNITY): Payer: Self-pay | Admitting: *Deleted

## 2017-10-01 DIAGNOSIS — Z951 Presence of aortocoronary bypass graft: Secondary | ICD-10-CM

## 2017-10-01 DIAGNOSIS — I5022 Chronic systolic (congestive) heart failure: Secondary | ICD-10-CM

## 2017-10-12 ENCOUNTER — Encounter (HOSPITAL_COMMUNITY): Payer: Self-pay | Admitting: *Deleted

## 2017-10-12 DIAGNOSIS — I5022 Chronic systolic (congestive) heart failure: Secondary | ICD-10-CM

## 2017-10-12 DIAGNOSIS — Z951 Presence of aortocoronary bypass graft: Secondary | ICD-10-CM

## 2017-10-12 NOTE — Addendum Note (Signed)
Encounter addended by: Dorna Bloom D on: 10/12/2017 12:09 PM  Actions taken: Flowsheet accepted, Flowsheet data copied forward, Visit Navigator Flowsheet section accepted

## 2017-10-12 NOTE — Progress Notes (Signed)
Discharge Progress Report  Patient Details  Name: Luis [name] MRN: [id number] Date of Birth: 1945-11-04 Referring Provider:     [place] from 06/09/2017 in [place]  Referring Provider  Luis Millin MD       Number of Visits: 32  Reason for Discharge:  Patient reached a stable level of exercise.  Smoking History:  Social History   Tobacco Use  Smoking Status Never Smoker  Smokeless Tobacco Never Used    Diagnosis:  Chronic systolic congestive heart failure (HCC)  S/P CABG x 2  ADL UCSD:   Initial Exercise Prescription:   Discharge Exercise Prescription (Final Exercise Prescription Changes): Exercise Prescription Changes - 10/12/17 1100      Response to Exercise   Blood Pressure (Admit)  102/72    Blood Pressure (Exercise)  118/64    Blood Pressure (Exit)  122/60    Heart Rate (Admit)  98 bpm    Heart Rate (Exercise)  109 bpm    Heart Rate (Exit)  84 bpm    Rating of Perceived Exertion (Exercise)  12    Symptoms  fatigue    Duration  Continue with 30 min of aerobic exercise without signs/symptoms of physical distress.    Intensity  THRR unchanged      Progression   Progression  Continue to progress workloads to maintain intensity without signs/symptoms of physical distress.    Average METs  4.6      Resistance Training   Training Prescription  Yes    Weight  4lbs    Reps  10-15    Time  10 Minutes      Recumbant Bike   Level  6 upright scifit bike    Minutes  10    METs  5.2      NuStep   Level  5    SPM  100    Minutes  10    METs  4.9      Track   Laps  16    Minutes  10    METs  3.79      Home Exercise Plan   Plans to continue exercise at  Home (comment) walking    Frequency  Add 2 additional days to program exercise sessions.    Initial Home Exercises Provided  07/01/17       Functional Capacity: 6 Minute Walk    Row Name 10/12/17 1138         6 Minute Walk   Phase  Discharge     Distance  1715 feet     Distance % Change  94.44 %     Distance Feet Change  833 ft     Walk Time  6 minutes     # of Rest Breaks  0     MPH  3.2     METS  3.4     RPE  12     VO2 Peak  11.81     Symptoms  Yes (comment)     Comments  fatigue!     Resting HR  61 bpm     Resting BP  94/67     Max Ex. HR  93 bpm     Max Ex. BP  100/54     2 Minute Post BP  112/70        Psychological, QOL, Others - Outcomes: PHQ 2/9: Depression screen [facility] 2/9 10/19/2017 10/12/2017  Decreased Interest 0 (No Data)  Down, Depressed, Hopeless 0 -  PHQ - 2 Score 0 -    Quality of Life:   Personal Goals: Goals established at orientation with interventions provided to work toward goal.    Personal Goals Discharge: Goals and Risk Factor Review    Row Name 08/28/17 1143 09/24/17 1644 10/01/17 1522         Core Components/Risk Factors/Patient Goals Review   Personal Goals Review  Weight Management/Obesity;Heart Failure;Lipids  Weight Management/Obesity;Heart Failure;Lipids  Weight Management/Obesity;Heart Failure;Lipids  (Pended)      Review    Pt is consistent with attendance to exercise and education classes.  Pt maintain his weight loss since his surgery.  No current labs noted in epic for review.    Pt is consistent with attendance to exercise and education classes.  Pt maintain his weight loss since his surgery.  No current labs noted in epic for review.    Pt is consistent with attendance to exercise and education classes.  Pt maintain his weight loss since his surgery.  No current labs noted in epic for review.  (Pended)      Expected Outcomes  Pt will participate in Cardiac Rehab, nutrition and lifestlye modifcation education to decrease overall risk factors.  Pt will participate in Cardiac Rehab, nutrition and lifestlye modifcation education to decrease overall risk factors.  -        Exercise Goals and Review:   Nutrition & Weight - Outcomes:  Post Biometrics -  10/12/17 1137       Post  Biometrics   Waist Circumference  38.75 inches    Hip Circumference  40.5 inches    Waist to Hip Ratio  0.96 %    Triceps Skinfold  18 mm    % Body Fat  27.6 %    Grip Strength  32 kg    Flexibility  10 in    Single Leg Stand  14.03 seconds       Nutrition:   Nutrition Discharge:   Education Questionnaire Score:   .Luis [name] graduated from cardiac rehab program today with completion of 32 exercise sessions in Phase II. Pt maintained good attendance and progressed nicely during his participation in rehab as evidenced by increased MET level. Luis [name] has made significant lifestyle changes and should be commended for his success. Pt feels he has achieved his goals during cardiac rehab. Luis [name] enjoyed participating in phase 2 cardiac rehab but did often complain of feeling fatigued. Luis [name] maintained his weight and increased his distance on his post exercise walk test. Luis [name]'s last day of participation was on  09/18/17. Luis [name] hopes to attend the maintenance program in the near future when he is feeling better.Luis Pall, RN,BSN 10/19/2017 9:08 AM

## 2017-10-19 NOTE — Addendum Note (Signed)
Encounter addended by: Magda Kiel, RN on: 10/19/2017 9:08 AM  Actions taken: Visit Navigator Flowsheet section accepted

## 2017-10-21 ENCOUNTER — Encounter (HOSPITAL_COMMUNITY)
Admission: RE | Admit: 2017-10-21 | Discharge: 2017-10-21 | Disposition: A | Discharge status: Home / Self Care | Payer: Self-pay | Source: Ambulatory Visit | Attending: Cardiology | Admitting: Cardiology

## 2017-10-21 DIAGNOSIS — Z48812 Encounter for surgical aftercare following surgery on the circulatory system: Secondary | ICD-10-CM | POA: Insufficient documentation

## 2017-10-21 DIAGNOSIS — Z951 Presence of aortocoronary bypass graft: Secondary | ICD-10-CM | POA: Insufficient documentation

## 2017-10-23 ENCOUNTER — Encounter (HOSPITAL_COMMUNITY)
Admission: RE | Admit: 2017-10-23 | Discharge: 2017-10-23 | Disposition: A | Discharge status: Home / Self Care | Payer: Self-pay | Source: Ambulatory Visit | Attending: Cardiology | Admitting: Cardiology

## 2017-10-26 ENCOUNTER — Encounter (HOSPITAL_COMMUNITY)
Admission: RE | Admit: 2017-10-26 | Discharge: 2017-10-26 | Disposition: A | Discharge status: Home / Self Care | Payer: Self-pay | Source: Ambulatory Visit | Attending: Cardiology | Admitting: Cardiology

## 2017-10-28 ENCOUNTER — Encounter (HOSPITAL_COMMUNITY)
Admission: RE | Admit: 2017-10-28 | Discharge: 2017-10-28 | Disposition: A | Discharge status: Home / Self Care | Payer: Self-pay | Source: Ambulatory Visit | Attending: Cardiology | Admitting: Cardiology

## 2017-10-30 ENCOUNTER — Encounter (HOSPITAL_COMMUNITY)
Admission: RE | Admit: 2017-10-30 | Discharge: 2017-10-30 | Disposition: A | Discharge status: Home / Self Care | Payer: Self-pay | Source: Ambulatory Visit | Attending: Cardiology | Admitting: Cardiology

## 2017-10-30 DIAGNOSIS — Z48812 Encounter for surgical aftercare following surgery on the circulatory system: Secondary | ICD-10-CM | POA: Insufficient documentation

## 2017-10-30 DIAGNOSIS — Z951 Presence of aortocoronary bypass graft: Secondary | ICD-10-CM | POA: Insufficient documentation

## 2017-11-02 ENCOUNTER — Encounter (HOSPITAL_COMMUNITY)
Admission: RE | Admit: 2017-11-02 | Discharge: 2017-11-02 | Disposition: A | Discharge status: Home / Self Care | Payer: Self-pay | Source: Ambulatory Visit | Attending: Cardiology | Admitting: Cardiology

## 2017-11-04 ENCOUNTER — Encounter (HOSPITAL_COMMUNITY): Payer: Self-pay

## 2017-11-06 ENCOUNTER — Encounter (HOSPITAL_COMMUNITY)
Admission: RE | Admit: 2017-11-06 | Discharge: 2017-11-06 | Disposition: A | Discharge status: Home / Self Care | Payer: Self-pay | Source: Ambulatory Visit | Attending: Cardiology | Admitting: Cardiology

## 2017-11-09 ENCOUNTER — Encounter (HOSPITAL_COMMUNITY)
Admission: RE | Admit: 2017-11-09 | Discharge: 2017-11-09 | Disposition: A | Discharge status: Home / Self Care | Payer: Medicare Other | Source: Ambulatory Visit | Attending: Cardiology | Admitting: Cardiology

## 2017-11-11 ENCOUNTER — Encounter (HOSPITAL_COMMUNITY)
Admission: RE | Admit: 2017-11-11 | Discharge: 2017-11-11 | Disposition: A | Discharge status: Home / Self Care | Payer: Self-pay | Source: Ambulatory Visit | Attending: Cardiology | Admitting: Cardiology

## 2017-11-13 ENCOUNTER — Encounter (HOSPITAL_COMMUNITY)
Admission: RE | Admit: 2017-11-13 | Discharge: 2017-11-13 | Disposition: A | Discharge status: Home / Self Care | Payer: Self-pay | Source: Ambulatory Visit | Attending: Cardiology | Admitting: Cardiology

## 2017-11-16 ENCOUNTER — Encounter (HOSPITAL_COMMUNITY)
Admission: RE | Admit: 2017-11-16 | Discharge: 2017-11-16 | Disposition: A | Discharge status: Home / Self Care | Payer: Self-pay | Source: Ambulatory Visit | Attending: Cardiology | Admitting: Cardiology

## 2017-11-18 ENCOUNTER — Encounter: Payer: Self-pay | Admitting: *Deleted

## 2017-11-18 ENCOUNTER — Ambulatory Visit: Payer: Medicare Other | Admitting: Cardiology

## 2017-11-18 ENCOUNTER — Encounter: Payer: Self-pay | Admitting: Cardiology

## 2017-11-18 ENCOUNTER — Encounter (HOSPITAL_COMMUNITY): Payer: Self-pay

## 2017-11-18 ENCOUNTER — Other Ambulatory Visit: Payer: Self-pay | Admitting: Cardiology

## 2017-11-18 VITALS — BP 100/56 | HR 78 | Ht 67.5 in | Wt 172.0 lb

## 2017-11-18 DIAGNOSIS — I25810 Atherosclerosis of coronary artery bypass graft(s) without angina pectoris: Secondary | ICD-10-CM | POA: Diagnosis not present

## 2017-11-18 DIAGNOSIS — I255 Ischemic cardiomyopathy: Secondary | ICD-10-CM

## 2017-11-18 DIAGNOSIS — E785 Hyperlipidemia, unspecified: Secondary | ICD-10-CM | POA: Diagnosis not present

## 2017-11-18 NOTE — Patient Instructions (Signed)
Medication Instructions:  Your physician recommends that you continue on your current medications as directed. Please refer to the Current Medication list given to you today.     * If you need a refill on your cardiac medications before your next appointment, please call your pharmacy. *  Labwork: Your physician recommends that you return for pre procedure lab work between: 12/01/2017 - 12/11/2017  Testing/Procedures: Your physician has recommended that you have a defibrillator inserted. An implantable cardioverter defibrillator (ICD) is a small device that is placed in your chest or, in rare cases, your abdomen. This device uses electrical pulses or shocks to help control life-threatening, irregular heartbeats that could lead the heart to suddenly stop beating (sudden cardiac arrest). Leads are attached to the ICD that goes into your heart. This is done in the hospital and usually requires an overnight stay. Please see the instruction sheet given to you today for more information.  Follow-Up: Your physician recommends that you schedule a wound check appointment 10-14 days, after your procedure on 12/15/2017, with the device clinic.  Your physician recommends that you schedule a follow up appointment in 91 days, after your procedure on 12/15/2017, with Dr. Curt Bears.  Thank you for choosing [facility]!!   Trinidad Curet, RN [phone]  Any Other Special Instructions Will Be Listed Below (If Applicable).   Cardioverter Defibrillator Implantation An implantable cardioverter defibrillator (ICD) is a small, lightweight, battery-powered device that is placed (implanted) under the skin in the chest or abdomen. Your caregiver may prescribe an ICD if:  You have had an irregular heart rhythm (arrhythmia) that originated in the lower chambers of the heart (ventricles).  Your heart has been damaged by a disease (such as coronary artery disease) or heart condition (such as a heart attack). An ICD  consists of a battery that lasts several years, a small computer called a pulse generator, and wires called leads that go into the heart. It is used to detect and correct two dangerous arrhythmias: a rapid heart rhythm (tachycardia) and an arrhythmia in which the ventricles contract in an uncoordinated way (fibrillation). When an ICD detects tachycardia, it sends an electrical signal to the heart that restores the heartbeat to normal (cardioversion). This signal is usually painless. If cardioversion does not work or if the ICD detects fibrillation, it delivers a small electrical shock to the heart (defibrillation) to restart the heart. The shock may feel like a strong jolt in the chest.ICDs may be programmed to correct other problems. Sometimes, ICDs are programmed to act as another type of implantable device called a pacemaker. Pacemakers are used to treat a slow heartbeat (bradycardia). LET YOUR CAREGIVER KNOW ABOUT:  Any allergies you have.  All medicines you are taking, including vitamins, herbs, eyedrops, and over-the-counter medicines and creams.  Previous problems you or members of your family have had with the use of anesthetics.  Any blood disorders you have had.  Other health problems you have. RISKS AND COMPLICATIONS Generally, the procedure to implant an ICD is safe. However, as with any surgical procedure, complications can occur. Possible complications associated with implanting an ICD include:  Swelling, bleeding, or bruising at the site where the ICD was implanted.  Infection at the site where the ICD was implanted.  A reaction to medicine used during the procedure.  Nerve, heart, or blood vessel damage.  Blood clots. BEFORE THE PROCEDURE  You may need to have blood tests, heart tests, or a chest X-ray done before the day of the  procedure.  Ask your caregiver about changing or stopping your regular medicines.  Make plans to have someone drive you home. You may need to  stay in the hospital overnight after the procedure.  Stop smoking at least 24 hours before the procedure.  Take a bath or shower the night before the procedure. You may need to scrub your chest or abdomen with a special type of soap.  Do not eat or drink before your procedure for as long as directed by your caregiver. Ask if it is okay to take any needed medicine with a small sip of water. PROCEDURE  The procedure to implant an ICD in your chest or abdomen is usually done at a hospital in a room that has a large X-ray machine called a fluoroscope. The machine will be above you during the procedure. It will help your caregiver see your heart during the procedure. Implanting an ICD usually takes 1-3 hours. Before the procedure:   Small monitors will be put on your body. They will be used to check your heart, blood pressure, and oxygen level.  A needle will be put into a vein in your hand or arm. This is called an intravenous (IV) access tube. Fluids and medicine will flow directly into your body through the IV tube.  Your chest or abdomen will be cleaned with a germ-killing (antiseptic) solution. The area may be shaved.  You may be given medicine to help you relax (sedative).  You will be given a medicine called a local anesthetic. This medicine will make the surgical site numb while the ICD is implanted. You will be sleepy but awake during the procedure. After you are numb the procedure will begin. The caregiver will:  Make a small cut (incision). This will make a pocket deep under your skin that will hold the pulse generator.  Guide the leads through a large blood vessel into your heart and attach them to the heart muscles. Depending on the ICD, the leads may go into one ventricle or they may go to both ventricles and into an upper chamber of the heart (atrium).  Test the ICD.  Close the incision with stitches, glue, or staples. AFTER THE PROCEDURE  You may feel pain. Some pain is  normal. It may last a few days.  You may stay in a recovery area until the local anesthetic has worn off. Your blood pressure and pulse will be checked often. You will be taken to a room where your heart will be monitored.  A chest X-ray will be taken. This is done to check that the cardioverter defibrillator is in the right place.  You may stay in the hospital overnight.  A slight bump may be seen over the skin where the ICD was placed. Sometimes, it is possible to feel the ICD under the skin. This is normal.  In the months and years afterward, your caregiver will check the device, the leads, and the battery every few months. Eventually, when the battery is low, the ICD will be replaced.   This information is not intended to replace advice given to you by your health care provider. Make sure you discuss any questions you have with your health care provider.   Document Released: 06/07/2002 Document Revised: 07/06/2013 Document Reviewed: 10/04/2012 Elsevier Interactive Patient Education [employer].

## 2017-11-18 NOTE — Progress Notes (Signed)
Electrophysiology Office Note   Date:  11/18/2017   ID:  Luis [name], Luis [name] October 19, 1945, MRN [id number]  PCP:  [name], MD  Cardiologist:  [name] Primary Electrophysiologist:  [name], MD    Chief Complaint  Patient presents with  . Follow-up    Ischemic cardiomyopathy/Chronic systolic HF     History of Present Illness: Luis [name] is a 72 y.o. male who is being seen today for the evaluation of CHF at the request of [name], MD. Presenting today for electrophysiology evaluation.  He had coronary angiography on 03/25/17 due to an abnormal nuclear stress test.  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  He underwent emergent CABG with a LIMA to the LAD saphenous grain to the OM.  He was discharged home but readmitted due to decompensated heart failure.  He was initially on Coreg and losartan which were both discontinued due to hypotension.  He has felt roughly the same since last being seen.  He used to exercise.  He has mild shortness of breath.  Today, denies symptoms of palpitations, chest pain, orthopnea, PND, lower extremity edema, claudication, dizziness, presyncope, syncope, bleeding, or neurologic sequela. The patient is tolerating medications without difficulties.     Past Medical History:  Diagnosis Date  . Chronic kidney disease    kidney stones; noted per H&P per Dr [name] 02/06/2015 chronic kidney disease stage II  . Diabetes mellitus without complication ([place])    per H&P Dr [name] noted to be prediabetic 02/06/2015  . Dyslipidemia   . History of hepatomegaly    with fatty liver discussed per H&P per Dr [name] 02/06/2015 secondary to abd ultrasound   . Sleep apnea    AHI 17 on sleep study 08/23/2013 pt states was given CPAP machine but sent it back - does not use   . Vitamin D deficiency    as noted per H&P per Dr [name] 02/06/2015   Past Surgical History:  Procedure Laterality Date  . ASD REPAIR N/A 03/25/2017     Procedure: ATRIAL SEPTAL DEFECT (ASD) REPAIR;  Surgeon: [name], MD;  Location: [facility];  Service: Open Heart Surgery;  Laterality: N/A;  . CORONARY ARTERY BYPASS GRAFT N/A 03/25/2017   Procedure: CORONARY ARTERY BYPASS GRAFTING (CABG) x 2 using left internal mammary artery and right greater saphenous leg vein using endosciope.;  Surgeon: [name], MD;  Location: [facility];  Service: Open Heart Surgery;  Laterality: N/A;  . CYSTOSCOPY W/ RETROGRADES Left 03/26/2015   Procedure: CYSTOSCOPY WITH RETROGRADE PYELOGRAM;  Surgeon: [name], MD;  Location: [facility];  Service: Urology;  Laterality: Left;  . LEFT HEART CATH AND CORONARY ANGIOGRAPHY N/A 03/25/2017   Procedure: Left Heart Cath and Coronary Angiography;  Surgeon: [name], MD;  Location: [facility];  Service: Cardiovascular;  Laterality: N/A;  . NO PAST SURGERIES    . PLACEMENT OF IMPELLA LEFT VENTRICULAR ASSIST DEVICE  03/25/2017   Procedure: PLACEMENT OF IMPELLA LEFT VENTRICULAR ASSIST DEVICE;  Surgeon: [name], MD;  Location: [place];  Service: Open Heart Surgery;;  . REMOVAL OF IMPELLA LEFT VENTRICULAR ASSIST DEVICE N/A 03/31/2017   Procedure: REMOVAL OF IMPELLA LEFT VENTRICULAR ASSIST DEVICE;  Surgeon: [name], MD;  Location: [place];  Service: Open Heart Surgery;  Laterality: N/A;  . ROBOT ASSISTED PYELOPLASTY Left 03/26/2015   Procedure: ROBOTIC ASSISTED PYELOPLASTY WITH STENT PLACEMENT, REMOVAL OF STONE AND CYST DECORTICATION;  Surgeon:  [name], MD;  Location: [facility];  Service: Urology;  Laterality: Left;  . TEE WITHOUT CARDIOVERSION N/A 03/25/2017   Procedure: TRANSESOPHAGEAL ECHOCARDIOGRAM (TEE);  Surgeon: [name], [name], MD;  Location: [place];  Service: Open Heart Surgery;  Laterality: N/A;  . TEE WITHOUT CARDIOVERSION N/A 03/31/2017   Procedure: TRANSESOPHAGEAL ECHOCARDIOGRAM (TEE);  Surgeon: [name], [name], MD;  Location: [place];  Service: Open Heart Surgery;  Laterality: N/A;      Current Outpatient Medications  Medication Sig Dispense Refill  . aspirin 81 MG chewable tablet Chew 81 mg by mouth daily.    . carvedilol (COREG) 6.25 MG tablet Take 6.25 mg by mouth 2 (two) times daily.  0  . clopidogrel (PLAVIX) 75 MG tablet Take 1 tablet (75 mg total) by mouth daily. 90 tablet 1  . Coenzyme Q10 (CO Q 10) 100 MG CAPS Take by mouth.    . escitalopram (LEXAPRO) 10 MG tablet Take 10 mg by mouth daily.  1  . ezetimibe (ZETIA) 10 MG tablet Take 10 mg by mouth daily.  2  . furosemide (LASIX) 20 MG tablet Take 20 mg by mouth daily as needed.    . Multiple Vitamin (MULTIVITAMIN) capsule Take 1 capsule by mouth daily.    . psyllium (METAMUCIL) 58.6 % powder Take 1 packet by mouth 3 (three) times daily.    . rosuvastatin (CRESTOR) 40 MG tablet Take 10 mg by mouth daily.    . sacubitril-valsartan (ENTRESTO) 24-26 MG Take 1 tablet by mouth 2 (two) times daily.     No current facility-administered medications for this visit.     Allergies:   No known allergies   Social History:  The patient  reports that  has never smoked. he has never used smokeless tobacco. He reports that he does not drink alcohol or use drugs.   Family History:  The patient's family history includes Healthy in his brother, father, mother, and sister.   ROS:  Please see the history of present illness.   Otherwise, review of systems is positive for shortness of breath.   All other systems are reviewed and negative.   PHYSICAL EXAM: VS:  BP (!) 100/56   Pulse 78   Ht 5' 7.5" (1.715 m)   Wt 172 lb (78 kg)   BMI 26.54 kg/m  , BMI Body mass index is 26.54 kg/m. GEN: Well nourished, well developed, in no acute distress  HEENT: normal  Neck: no JVD, carotid bruits, or masses Cardiac: RRR; no murmurs, rubs, or gallops,no edema  Respiratory:  clear to auscultation bilaterally, normal work of breathing GI: soft, nontender, nondistended, + BS MS: no deformity or atrophy  Skin: warm and dry Neuro:   Strength and sensation are intact Psych: euthymic mood, full affect  EKG:  EKG is not ordered today. Personal review of the ekg ordered 09/11/17 shows sinus rhythm, right axis, rate 82  Recent Labs: 04/07/2017: ALT 20 04/09/2017: Magnesium 2.3 04/16/2017: Hemoglobin 9.9; Platelets 414 04/17/2017: B Natriuretic Peptide 494.8; BUN 12; Creatinine, Ser 1.19; Potassium 4.3; Sodium 134    Lipid Panel  No results found for: CHOL, TRIG, HDL, CHOLHDL, VLDL, LDLCALC, LDLDIRECT   Wt Readings from Last 3 Encounters:  11/18/17 172 lb (78 kg)  09/11/17 174 lb (78.9 kg)  06/09/17 167 lb 15.9 oz (76.2 kg)      Other studies Reviewed: Additional studies/ records that were reviewed today include: TTE 08/13/17 Review of the above records today demonstrates:  LV cavity normal size.  Ejection fraction 20-25%.  Akinesis in the LAD/diagonal territory Grade 3 diastolic dysfunction. Moderately dilated left atrium mild tricuspid regurgitation  TTE 10/31/17 Akinetic distal LAD territory.  Severe hypokinesis mid LAD territory.  Moderate diffuse hypokinesis.  Grade 1 diastolic dysfunction.  EF 27%. Left atrium mildly dilated Mild tricuspid regurgitation  ASSESSMENT AND PLAN:  1.  Chronic systolic heart failure due to ischemic cardiomyopathy: Only on optimal medical therapy with Entresto and carvedilol.  Repeat echo shows his ejection fraction is still significantly reduced.  I discussed with him the option of ICD implantation.  Risks and benefits discussed.  Risks include bleeding, tamponade, infection, and pneumothorax, among others.  Both him and his wife understand these risks and agreed to the  2.  Hyperlipidemia: Continue Crestor  3.  Coronary artery disease: Status post CABG.  No current chest pain.  Continue current management.  Case discussed with primary cardiologist  Current medicines are reviewed at length with the patient today.   The patient does not have concerns regarding his medicines.   The following changes were made today: None  Labs/ tests ordered today include:  No orders of the defined types were placed in this encounter.    Disposition:   FU with Will Camnitz 3 months  Signed, Will Meredith Leeds, MD  11/18/2017 11:26 AM     [facility] [address] [place] [address] [phone] (office) [phone] (fax)

## 2017-11-20 ENCOUNTER — Encounter (HOSPITAL_COMMUNITY)
Admission: RE | Admit: 2017-11-20 | Discharge: 2017-11-20 | Disposition: A | Discharge status: Home / Self Care | Payer: Medicare Other | Source: Ambulatory Visit | Attending: Cardiology | Admitting: Cardiology

## 2017-11-20 ENCOUNTER — Telehealth: Payer: Self-pay | Admitting: Cardiology

## 2017-11-20 NOTE — Telephone Encounter (Signed)
New message    Patient would like to discuss changing procedure date. Please call

## 2017-11-20 NOTE — Telephone Encounter (Signed)
Patient calling and states that Sherri, RN told him that he could possibly re-schedule his procedure to be done sooner. Made patient aware that Sherri, RN was out of the office today and that I was not sure what date she was looking at. Made patient aware that Venida Jarvis would be back in the office on Monday and that she could call him at that time. Patient states that he will just keep his procedure scheduled for 12/15/17 and will not change it. Patient thanked me for the call.

## 2017-11-23 ENCOUNTER — Encounter (HOSPITAL_COMMUNITY)
Admission: RE | Admit: 2017-11-23 | Discharge: 2017-11-23 | Disposition: A | Discharge status: Home / Self Care | Payer: Self-pay | Source: Ambulatory Visit | Attending: Cardiology | Admitting: Cardiology

## 2017-11-25 ENCOUNTER — Encounter (HOSPITAL_COMMUNITY)
Admission: RE | Admit: 2017-11-25 | Discharge: 2017-11-25 | Disposition: A | Discharge status: Home / Self Care | Payer: Self-pay | Source: Ambulatory Visit | Attending: Cardiology | Admitting: Cardiology

## 2017-11-27 ENCOUNTER — Encounter (HOSPITAL_COMMUNITY)
Admission: RE | Admit: 2017-11-27 | Discharge: 2017-11-27 | Disposition: A | Discharge status: Home / Self Care | Payer: Self-pay | Source: Ambulatory Visit | Attending: Cardiology | Admitting: Cardiology

## 2017-11-27 DIAGNOSIS — Z951 Presence of aortocoronary bypass graft: Secondary | ICD-10-CM | POA: Insufficient documentation

## 2017-11-27 DIAGNOSIS — Z48812 Encounter for surgical aftercare following surgery on the circulatory system: Secondary | ICD-10-CM | POA: Insufficient documentation

## 2017-11-30 ENCOUNTER — Encounter (HOSPITAL_COMMUNITY)
Admission: RE | Admit: 2017-11-30 | Discharge: 2017-11-30 | Disposition: A | Discharge status: Home / Self Care | Payer: Self-pay | Source: Ambulatory Visit | Attending: Cardiology | Admitting: Cardiology

## 2017-12-02 ENCOUNTER — Encounter (HOSPITAL_COMMUNITY): Payer: Self-pay

## 2017-12-04 ENCOUNTER — Encounter (HOSPITAL_COMMUNITY): Payer: Self-pay

## 2017-12-07 ENCOUNTER — Encounter (HOSPITAL_COMMUNITY): Payer: Self-pay

## 2017-12-09 ENCOUNTER — Encounter (HOSPITAL_COMMUNITY): Payer: Self-pay

## 2017-12-10 ENCOUNTER — Other Ambulatory Visit: Payer: Self-pay | Admitting: *Deleted

## 2017-12-10 ENCOUNTER — Other Ambulatory Visit: Payer: Medicare Other

## 2017-12-10 DIAGNOSIS — Z01812 Encounter for preprocedural laboratory examination: Secondary | ICD-10-CM

## 2017-12-10 DIAGNOSIS — I5022 Chronic systolic (congestive) heart failure: Secondary | ICD-10-CM

## 2017-12-10 DIAGNOSIS — I255 Ischemic cardiomyopathy: Secondary | ICD-10-CM

## 2017-12-10 LAB — CBC WITH DIFFERENTIAL/PLATELET
Basophils Absolute: 0 10*3/uL (ref 0.0–0.2)
Basos: 0 %
EOS (ABSOLUTE): 0.3 10*3/uL (ref 0.0–0.4)
Eos: 5 %
Hematocrit: 40.5 % (ref 37.5–51.0)
Hemoglobin: 13.8 g/dL (ref 13.0–17.7)
Immature Grans (Abs): 0 10*3/uL (ref 0.0–0.1)
Immature Granulocytes: 0 %
Lymphocytes Absolute: 1.5 10*3/uL (ref 0.7–3.1)
Lymphs: 22 %
MCH: 30 pg (ref 26.6–33.0)
MCHC: 34.1 g/dL (ref 31.5–35.7)
MCV: 88 fL (ref 79–97)
Monocytes Absolute: 0.6 10*3/uL (ref 0.1–0.9)
Monocytes: 10 %
Neutrophils Absolute: 4.1 10*3/uL (ref 1.4–7.0)
Neutrophils: 63 %
Platelets: 243 10*3/uL (ref 150–379)
RBC: 4.6 x10E6/uL (ref 4.14–5.80)
RDW: 16.5 % — ABNORMAL HIGH (ref 12.3–15.4)
WBC: 6.5 10*3/uL (ref 3.4–10.8)

## 2017-12-10 LAB — BASIC METABOLIC PANEL
BUN/Creatinine Ratio: 18 (ref 10–24)
BUN: 18 mg/dL (ref 8–27)
CO2: 23 mmol/L (ref 20–29)
Calcium: 9.4 mg/dL (ref 8.6–10.2)
Chloride: 104 mmol/L (ref 96–106)
Creatinine, Ser: 1 mg/dL (ref 0.76–1.27)
GFR calc Af Amer: 88 mL/min/{1.73_m2} (ref >59)
GFR calc non Af Amer: 76 mL/min/{1.73_m2} (ref >59)
Glucose: 104 mg/dL — ABNORMAL HIGH (ref 65–99)
Potassium: 5.1 mmol/L (ref 3.5–5.2)
Sodium: 140 mmol/L (ref 134–144)

## 2017-12-11 ENCOUNTER — Encounter (HOSPITAL_COMMUNITY): Payer: Self-pay

## 2017-12-14 ENCOUNTER — Encounter (HOSPITAL_COMMUNITY): Payer: Self-pay

## 2017-12-15 ENCOUNTER — Other Ambulatory Visit: Payer: Self-pay | Admitting: Cardiology

## 2017-12-15 ENCOUNTER — Other Ambulatory Visit: Payer: Self-pay

## 2017-12-15 ENCOUNTER — Ambulatory Visit (HOSPITAL_COMMUNITY)
Admission: RE | Disposition: A | Discharge status: Home / Self Care | Payer: Self-pay | Source: Ambulatory Visit | Attending: Cardiology

## 2017-12-15 ENCOUNTER — Encounter (HOSPITAL_COMMUNITY): Payer: Self-pay | Admitting: General Practice

## 2017-12-15 ENCOUNTER — Ambulatory Visit (HOSPITAL_COMMUNITY)
Admission: RE | Admit: 2017-12-15 | Discharge: 2017-12-16 | Disposition: A | Discharge status: Home / Self Care | Payer: Medicare Other | Source: Ambulatory Visit | Attending: Cardiology | Admitting: Cardiology

## 2017-12-15 DIAGNOSIS — I2510 Atherosclerotic heart disease of native coronary artery without angina pectoris: Secondary | ICD-10-CM | POA: Insufficient documentation

## 2017-12-15 DIAGNOSIS — Z951 Presence of aortocoronary bypass graft: Secondary | ICD-10-CM | POA: Insufficient documentation

## 2017-12-15 DIAGNOSIS — Z006 Encounter for examination for normal comparison and control in clinical research program: Secondary | ICD-10-CM | POA: Diagnosis not present

## 2017-12-15 DIAGNOSIS — Z95818 Presence of other cardiac implants and grafts: Secondary | ICD-10-CM

## 2017-12-15 DIAGNOSIS — Z95810 Presence of automatic (implantable) cardiac defibrillator: Secondary | ICD-10-CM

## 2017-12-15 DIAGNOSIS — E1122 Type 2 diabetes mellitus with diabetic chronic kidney disease: Secondary | ICD-10-CM | POA: Diagnosis not present

## 2017-12-15 DIAGNOSIS — I255 Ischemic cardiomyopathy: Secondary | ICD-10-CM | POA: Diagnosis present

## 2017-12-15 DIAGNOSIS — N189 Chronic kidney disease, unspecified: Secondary | ICD-10-CM | POA: Insufficient documentation

## 2017-12-15 DIAGNOSIS — Z7982 Long term (current) use of aspirin: Secondary | ICD-10-CM | POA: Insufficient documentation

## 2017-12-15 DIAGNOSIS — I5022 Chronic systolic (congestive) heart failure: Secondary | ICD-10-CM | POA: Diagnosis not present

## 2017-12-15 DIAGNOSIS — Z79899 Other long term (current) drug therapy: Secondary | ICD-10-CM | POA: Diagnosis not present

## 2017-12-15 HISTORY — DX: Presence of automatic (implantable) cardiac defibrillator: Z95.810

## 2017-12-15 HISTORY — DX: Personal history of urinary calculi: Z87.442

## 2017-12-15 HISTORY — PX: ICD IMPLANT: EP1208

## 2017-12-15 HISTORY — PX: CARDIAC DEFIBRILLATOR PLACEMENT: SHX171

## 2017-12-15 LAB — SURGICAL PCR SCREEN
MRSA, PCR: NEGATIVE
Staphylococcus aureus: NEGATIVE

## 2017-12-15 LAB — GLUCOSE, CAPILLARY: Glucose-Capillary: 95 mg/dL (ref 65–99)

## 2017-12-15 SURGERY — ICD IMPLANT

## 2017-12-15 MED ORDER — ONDANSETRON HCL 4 MG/2ML IJ SOLN: 4.0000 mg | Freq: Four times a day (QID) | INTRAMUSCULAR | Status: DC | PRN

## 2017-12-15 MED ORDER — ASPIRIN 81 MG PO CHEW
81.0000 mg | CHEWABLE_TABLET | Freq: Every day | ORAL | Status: DC
  Administered 2017-12-15 – 2017-12-16 (×2): 81 mg via ORAL
  Filled 2017-12-15 (×2): qty 1

## 2017-12-15 MED ORDER — CEFAZOLIN SODIUM-DEXTROSE 1-4 GM/50ML-% IV SOLN
1.0000 g | Freq: Four times a day (QID) | INTRAVENOUS | Status: AC
  Administered 2017-12-15 – 2017-12-16 (×3): 1 g via INTRAVENOUS
  Filled 2017-12-15 (×3): qty 50

## 2017-12-15 MED ORDER — CEFAZOLIN SODIUM-DEXTROSE 2-4 GM/100ML-% IV SOLN
INTRAVENOUS | Status: AC
  Filled 2017-12-15: qty 100

## 2017-12-15 MED ORDER — HEPARIN (PORCINE) IN NACL 2-0.9 UNIT/ML-% IJ SOLN
INTRAMUSCULAR | Status: AC
  Filled 2017-12-15: qty 500

## 2017-12-15 MED ORDER — FENTANYL CITRATE (PF) 100 MCG/2ML IJ SOLN
INTRAMUSCULAR | Status: DC | PRN
  Administered 2017-12-15: 25 ug via INTRAVENOUS

## 2017-12-15 MED ORDER — ACETAMINOPHEN 325 MG PO TABS
ORAL_TABLET | ORAL | Status: AC
  Filled 2017-12-15: qty 2

## 2017-12-15 MED ORDER — CO Q 10 100 MG PO CAPS: 100.0000 mg | ORAL_CAPSULE | Freq: Every day | ORAL | Status: DC

## 2017-12-15 MED ORDER — SACUBITRIL-VALSARTAN 24-26 MG PO TABS
1.0000 | ORAL_TABLET | Freq: Two times a day (BID) | ORAL | Status: DC
  Administered 2017-12-15 – 2017-12-16 (×2): 1 via ORAL
  Filled 2017-12-15 (×2): qty 1

## 2017-12-15 MED ORDER — EZETIMIBE 10 MG PO TABS
10.0000 mg | ORAL_TABLET | Freq: Every day | ORAL | Status: DC
  Administered 2017-12-15 – 2017-12-16 (×2): 10 mg via ORAL
  Filled 2017-12-15 (×2): qty 1

## 2017-12-15 MED ORDER — LIDOCAINE HCL (PF) 1 % IJ SOLN
INTRAMUSCULAR | Status: DC | PRN
  Administered 2017-12-15: 60 mL

## 2017-12-15 MED ORDER — CLOPIDOGREL BISULFATE 75 MG PO TABS
75.0000 mg | ORAL_TABLET | Freq: Every day | ORAL | Status: DC
  Administered 2017-12-15 – 2017-12-16 (×2): 75 mg via ORAL
  Filled 2017-12-15 (×2): qty 1

## 2017-12-15 MED ORDER — CEFAZOLIN SODIUM-DEXTROSE 2-4 GM/100ML-% IV SOLN
2.0000 g | INTRAVENOUS | Status: AC
  Administered 2017-12-15: 2 g via INTRAVENOUS

## 2017-12-15 MED ORDER — SODIUM CHLORIDE 0.9 % IR SOLN
80.0000 mg | Status: AC
  Administered 2017-12-15: 80 mg

## 2017-12-15 MED ORDER — SODIUM CHLORIDE 0.9 % IV SOLN
INTRAVENOUS | Status: DC
  Administered 2017-12-15: 08:00:00 via INTRAVENOUS

## 2017-12-15 MED ORDER — LIDOCAINE HCL (PF) 1 % IJ SOLN
INTRAMUSCULAR | Status: AC
  Filled 2017-12-15: qty 60

## 2017-12-15 MED ORDER — ADULT MULTIVITAMIN W/MINERALS CH
1.0000 | ORAL_TABLET | Freq: Every day | ORAL | Status: DC
  Administered 2017-12-15 – 2017-12-16 (×2): 1 via ORAL
  Filled 2017-12-15 (×2): qty 1

## 2017-12-15 MED ORDER — CARVEDILOL 6.25 MG PO TABS
6.2500 mg | ORAL_TABLET | Freq: Two times a day (BID) | ORAL | Status: DC
  Administered 2017-12-15 – 2017-12-16 (×2): 6.25 mg via ORAL
  Filled 2017-12-15 (×3): qty 1

## 2017-12-15 MED ORDER — HEPARIN (PORCINE) IN NACL 2-0.9 UNIT/ML-% IJ SOLN
INTRAMUSCULAR | Status: AC | PRN
  Administered 2017-12-15: 500 mL

## 2017-12-15 MED ORDER — MUPIROCIN 2 % EX OINT
1.0000 "application " | TOPICAL_OINTMENT | Freq: Once | CUTANEOUS | Status: AC
  Administered 2017-12-15: 1 via TOPICAL

## 2017-12-15 MED ORDER — FENTANYL CITRATE (PF) 100 MCG/2ML IJ SOLN
INTRAMUSCULAR | Status: AC
  Filled 2017-12-15: qty 2

## 2017-12-15 MED ORDER — MUPIROCIN 2 % EX OINT
TOPICAL_OINTMENT | CUTANEOUS | Status: AC
  Administered 2017-12-15: 1 via TOPICAL
  Filled 2017-12-15: qty 22

## 2017-12-15 MED ORDER — MIDAZOLAM HCL 5 MG/5ML IJ SOLN
INTRAMUSCULAR | Status: DC | PRN
  Administered 2017-12-15: 1 mg via INTRAVENOUS

## 2017-12-15 MED ORDER — ACETAMINOPHEN 325 MG PO TABS
325.0000 mg | ORAL_TABLET | ORAL | Status: DC | PRN
  Administered 2017-12-15 – 2017-12-16 (×2): 650 mg via ORAL
  Filled 2017-12-15: qty 2

## 2017-12-15 MED ORDER — GENTAMICIN SULFATE 40 MG/ML IJ SOLN
INTRAMUSCULAR | Status: AC
  Filled 2017-12-15: qty 2

## 2017-12-15 MED ORDER — MIDAZOLAM HCL 5 MG/5ML IJ SOLN
INTRAMUSCULAR | Status: AC
  Filled 2017-12-15: qty 5

## 2017-12-15 SURGICAL SUPPLY — 7 items
CABLE SURGICAL S-101-97-12 (CABLE) ×2 IMPLANT
ICD VISIA MRI VR DVFB1D4 (ICD Generator) IMPLANT
LEAD SPRINT QUAT SEC 6935M-62 (Lead) ×2 IMPLANT
PAD DEFIB LIFELINK (PAD) ×2 IMPLANT
SHEATH CLASSIC 9F (SHEATH) ×2 IMPLANT
TRAY PACEMAKER INSERTION (PACKS) ×2 IMPLANT
VISIA MRI VR DVFB1D4 (ICD Generator) ×3 IMPLANT

## 2017-12-15 NOTE — Progress Notes (Signed)
Report and care transferred to [facility]

## 2017-12-15 NOTE — Discharge Summary (Addendum)
ELECTROPHYSIOLOGY PROCEDURE DISCHARGE SUMMARY    Patient ID: Luis [name],  MRN: [id number], DOB/AGE: 05/24/1946 72 y.o.  Admit date: 12/15/2017 Discharge date: 12/16/17  Primary Care Physician: [name], MD  Primary Cardiologist: Dr. [name] Electrophysiologist: Dr. Curt Bears  Primary Discharge Diagnosis:  1. ICM  Secondary Discharge Diagnosis:  1. CAD     03/25/17 LHC w/ left main dissection during revascularization of a CTO to the LAD.  He underwent emergent CABG with a LIMA to the LAD saphenous grain to the OM 2. CRI 3. DM 4. Chronic CHF (systolic)   Allergies  Allergen Reactions  . Crestor [Rosuvastatin Calcium] Other (See Comments)    myalgias     Procedures This Admission:  1.  Implantation of a MDT single chamber ICD on 12/15/17 by Dr Curt Bears.  The patient received a Medtronic, model [id number] (serial number [id number]) right ventricular defibrillator lead, Medtronic Visia AF MRI SureScan (serial  Number [id number]) ICD DFT's were deferred at time of implant There were no immediate post procedure complications. 2.  CXR on demonstrated no pneumothorax status post device implantation.   Brief HPI: Luis [name] is a 72 y.o. male was referred to electrophysiology in the outpatient setting for consideration of ICD implantation.  Past medical history is noted above.  The patient has persistent LV dysfunction despite guideline directed therapy.  Risks, benefits, and alternatives to ICD implantation were reviewed with the patient who wished to proceed.   Hospital Course:  The patient was admitted and underwent implantation of an ICD with details as outlined above. He was monitored on telemetry overnight which demonstrated SR.  Left chest was without hematoma or ecchymosis.  The device was interrogated and found to be functioning normally.  CXR was obtained and demonstrated no pneumothorax status post device implantation.  Wound care, arm mobility, and restrictions were  reviewed with the patient.  The patient feels well, no CP, palpitations or SOB, he was examined by Dr. Curt Bears and considered stable for discharge to home.   The patient's discharge medications include an ARB (Entresto) and beta blocker (carvedilol).   Physical Exam: Vitals:   12/15/17 2236 12/16/17 0012 12/16/17 0513 12/16/17 0832  BP: 107/69 112/66 109/67 113/73  Pulse: 81 86 95 93  Resp:  18 18 18   Temp:  98 F (36.7 C) 98.6 F (37 C) 98.3 F (36.8 C)  TempSrc:  Oral Oral Oral  SpO2:  96% 96% 96%  Weight:   167 lb 9.6 oz (76 kg)   Height:        GEN- The patient is well appearing, alert and oriented x 3 today.   HEENT: normocephalic, atraumatic; sclera clear, conjunctiva pink; hearing intact; oropharynx clear Lungs- CTA b/l, normal work of breathing.  No wheezes, rales, rhonchi Heart- RRR, no murmurs, rubs or gallops, PMI not laterally displaced GI- soft, non-tender, non-distended Extremities- no clubbing, cyanosis, or edema MS- no significant deformity or atrophy Skin- warm and dry, no rash or lesion, left chest without hematoma/ecchymosis Psych- euthymic mood, full affect Neuro- no gross defecits  Labs:   Lab Results  Component Value Date   WBC 6.5 12/10/2017   HGB 13.8 12/10/2017   HCT 40.5 12/10/2017   MCV 88 12/10/2017   PLT 243 12/10/2017    Recent Labs  Lab 12/10/17 1056  NA 140  K 5.1  CL 104  CO2 23  BUN 18  CREATININE 1.00  CALCIUM 9.4  GLUCOSE 104*    Discharge Medications:  Allergies as of  12/16/2017      Reactions   Crestor [rosuvastatin Calcium] Other (See Comments)   myalgias      Medication List    TAKE these medications   aspirin 81 MG chewable tablet Chew 81 mg by mouth daily.   carvedilol 6.25 MG tablet Commonly known as:  COREG Take 6.25 mg by mouth 2 (two) times daily.   clopidogrel 75 MG tablet Commonly known as:  PLAVIX Take 1 tablet (75 mg total) by mouth daily.   Co Q 10 100 MG Caps Take 100 mg by mouth daily.     ENTRESTO 24-26 MG Generic drug:  sacubitril-valsartan Take 1 tablet by mouth 2 (two) times daily.   ezetimibe 10 MG tablet Commonly known as:  ZETIA Take 10 mg by mouth daily.   multivitamin capsule Take 1 capsule by mouth daily.   psyllium 58.6 % powder Commonly known as:  METAMUCIL Take 1 packet by mouth 3 (three) times daily.       Disposition:  Home  Discharge Instructions    Diet - low sodium heart healthy   Complete by:  As directed    Increase activity slowly   Complete by:  As directed      Follow-up Information    [facility] Follow up on 12/28/2017.   Specialty:  Cardiology Why:  10:00AM, wound check visit Contact information: [address] [place]       [name], MD Follow up on 03/23/2018.   Specialty:  Cardiology Why:  9:45AM Contact information: [place] [address] [phone]           Duration of Discharge Encounter: Greater than 30 minutes including physician time.  Venetia Night, PA-C 12/16/2017 9:36 AM   I have seen and examined this patient with Tommye Standard.  Agree with above, note added to reflect my findings.  On exam, RRR, no murmurs, lungs clear.  Medtronic ICD implanted for ischemic cardiomyopathy.  Device function and chest x-ray without major abnormality.  Plan for discharge today with follow-up in device clinic.  Tallulah Hosman M. Burnham Trost MD 12/16/2017 9:46 AM

## 2017-12-15 NOTE — H&P (Signed)
Luis [name] has presented today for surgery, with the diagnosis of ischemic cardiomyopathy.  The various methods of treatment have been discussed with the patient and family. After consideration of risks, benefits and other options for treatment, the patient has consented to  Procedure(s): ICD implant as a surgical intervention .  Risks include but not limited to bleeding, tamponade, infection, pneumothorax, among others. The patient's history has been reviewed, patient examined, no change in status, stable for surgery.  I have reviewed the patient's chart and labs.  Questions were answered to the patient's satisfaction.    Luis [name] Curt Bears, MD 12/15/2017 8:54 AM  ICD Criteria  Current LVEF:25-30%. Within 12 months prior to implant: Yes   Heart failure history: Yes, Class II  Cardiomyopathy history: Yes, Ischemic Cardiomyopathy - Prior MI.  Atrial Fibrillation/Atrial Flutter: No.  Ventricular tachycardia history: No.  Cardiac arrest history: No.  History of syndromes with risk of sudden death: No.  Previous ICD: No.  Current ICD indication: Primary  PPM indication: No.   Class I or II Bradycardia indication present: No  Beta Blocker therapy for 3 or more months: Yes, prescribed.   Ace Inhibitor/ARB therapy for 3 or more months: Yes, prescribed.

## 2017-12-15 NOTE — Progress Notes (Signed)
Patient resting comfortably at this time. Lt upper arm site remains WNL. Will continue to monitor patient.

## 2017-12-15 NOTE — Discharge Instructions (Signed)
° ° °  Supplemental Discharge Instructions for  Pacemaker/Defibrillator Patients  Activity No heavy lifting or vigorous activity with your left/right arm for 6 to 8 weeks.  Do not raise your left/right arm above your head for one week.  Gradually raise your affected arm as drawn below.              12/19/17                    12/20/17                    12/21/17                   12/22/17 __  NO DRIVING for  03/18/34   ; you may begin driving on  5/97/41 .  WOUND CARE - Keep the wound area clean and dry.  Do not get this area wet, no showers until cleared to at your wound check visit - The tape/steri-strips on your wound will fall off; do not pull them off.  No bandage is needed on the site.  DO  NOT apply any creams, oils, or ointments to the wound area. - If you notice any drainage or discharge from the wound, any swelling or bruising at the site, or you develop a fever > 101? F after you are discharged home, call the office at once.  Special Instructions - You are still able to use cellular telephones; use the ear opposite the side where you have your pacemaker/defibrillator.  Avoid carrying your cellular phone near your device. - When traveling through airports, show security personnel your identification card to avoid being screened in the metal detectors.  Ask the security personnel to use the hand wand. - Avoid arc welding equipment, MRI testing (magnetic resonance imaging), TENS units (transcutaneous nerve stimulators).  Call the office for questions about other devices. - Avoid electrical appliances that are in poor condition or are not properly grounded. - Microwave ovens are safe to be near or to operate.  Additional information for defibrillator patients should your device go off: - If your device goes off ONCE and you feel fine afterward, notify the device clinic nurses. - If your device goes off ONCE and you do not feel well afterward, call 911. - If your device goes off TWICE,  call 911. - If your device goes off THREE times in one day, call 911.  DO NOT DRIVE YOURSELF OR A FAMILY MEMBER WITH A DEFIBRILLATOR TO THE HOSPITAL--CALL 911.

## 2017-12-15 NOTE — Progress Notes (Signed)
SCD's applied to pt.

## 2017-12-16 ENCOUNTER — Encounter (HOSPITAL_COMMUNITY): Payer: Self-pay | Admitting: Cardiology

## 2017-12-16 ENCOUNTER — Ambulatory Visit (HOSPITAL_COMMUNITY): Payer: Medicare Other

## 2017-12-16 ENCOUNTER — Encounter (HOSPITAL_COMMUNITY): Payer: Self-pay

## 2017-12-16 DIAGNOSIS — Z006 Encounter for examination for normal comparison and control in clinical research program: Secondary | ICD-10-CM | POA: Diagnosis not present

## 2017-12-16 DIAGNOSIS — I5022 Chronic systolic (congestive) heart failure: Secondary | ICD-10-CM | POA: Diagnosis not present

## 2017-12-16 DIAGNOSIS — I255 Ischemic cardiomyopathy: Secondary | ICD-10-CM | POA: Diagnosis not present

## 2017-12-16 DIAGNOSIS — I2510 Atherosclerotic heart disease of native coronary artery without angina pectoris: Secondary | ICD-10-CM | POA: Diagnosis not present

## 2017-12-16 MED FILL — Gentamicin Sulfate Inj 40 MG/ML: INTRAMUSCULAR | Qty: 80 | Status: AC

## 2017-12-16 MED FILL — Cefazolin Sodium-Dextrose IV Solution 2 GM/100ML-4%: INTRAVENOUS | Qty: 100 | Status: AC

## 2017-12-18 ENCOUNTER — Encounter (HOSPITAL_COMMUNITY): Payer: Self-pay

## 2017-12-21 ENCOUNTER — Encounter (HOSPITAL_COMMUNITY): Payer: Self-pay

## 2017-12-22 ENCOUNTER — Ambulatory Visit: Payer: Medicare Other | Admitting: Cardiology

## 2017-12-23 ENCOUNTER — Encounter (HOSPITAL_COMMUNITY): Payer: Self-pay

## 2017-12-25 ENCOUNTER — Encounter (HOSPITAL_COMMUNITY): Payer: Self-pay

## 2017-12-25 IMAGING — CR DG CHEST 2V
2 series · 2 of 2 positions shown · non-contrast
Comparison: Chest radiograph dated 04/06/2017

CLINICAL DATA: 8-year-old male with shortness of breath. Recent
CABG surgery.

EXAM:
CHEST  2 VIEW

[chest pa]
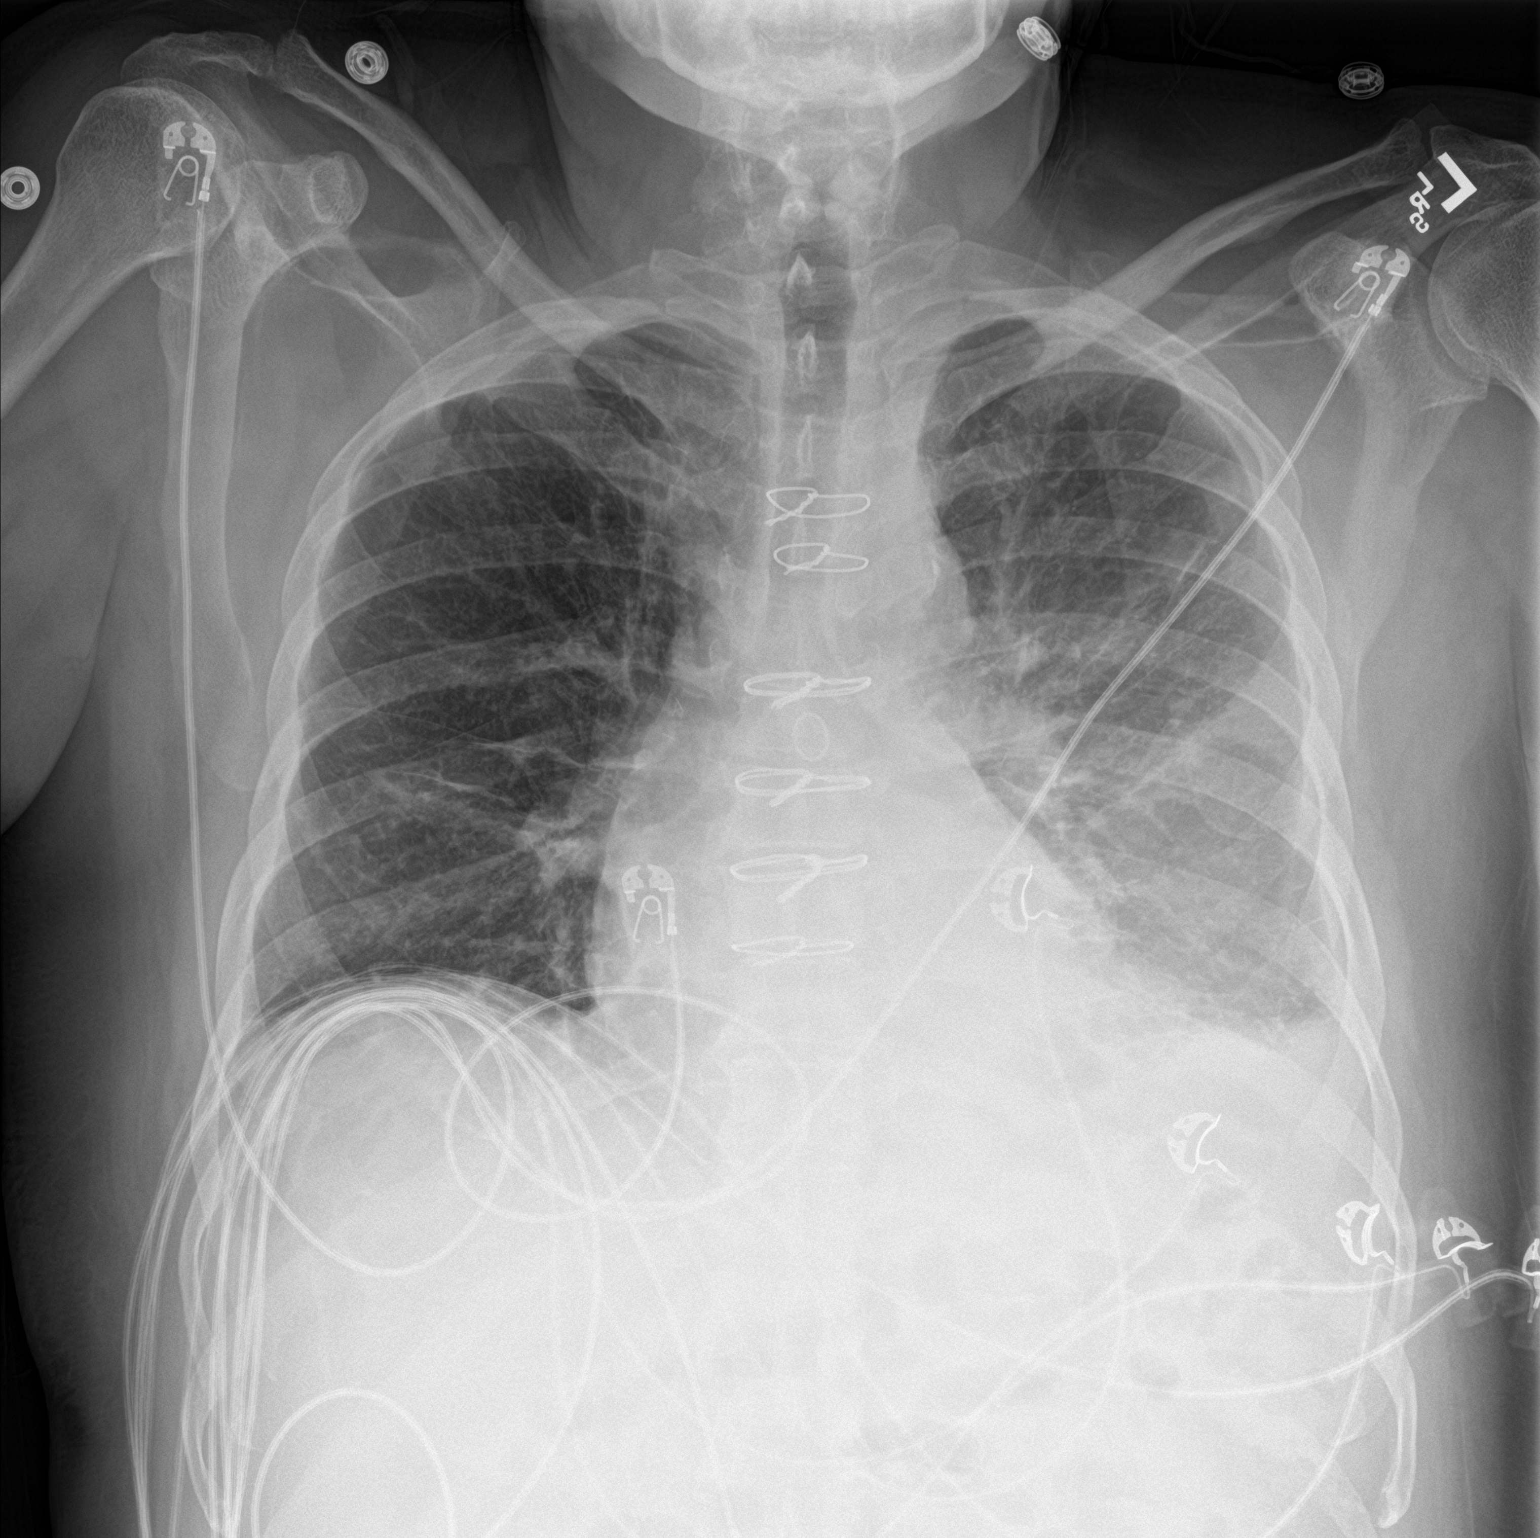

[chest lat]
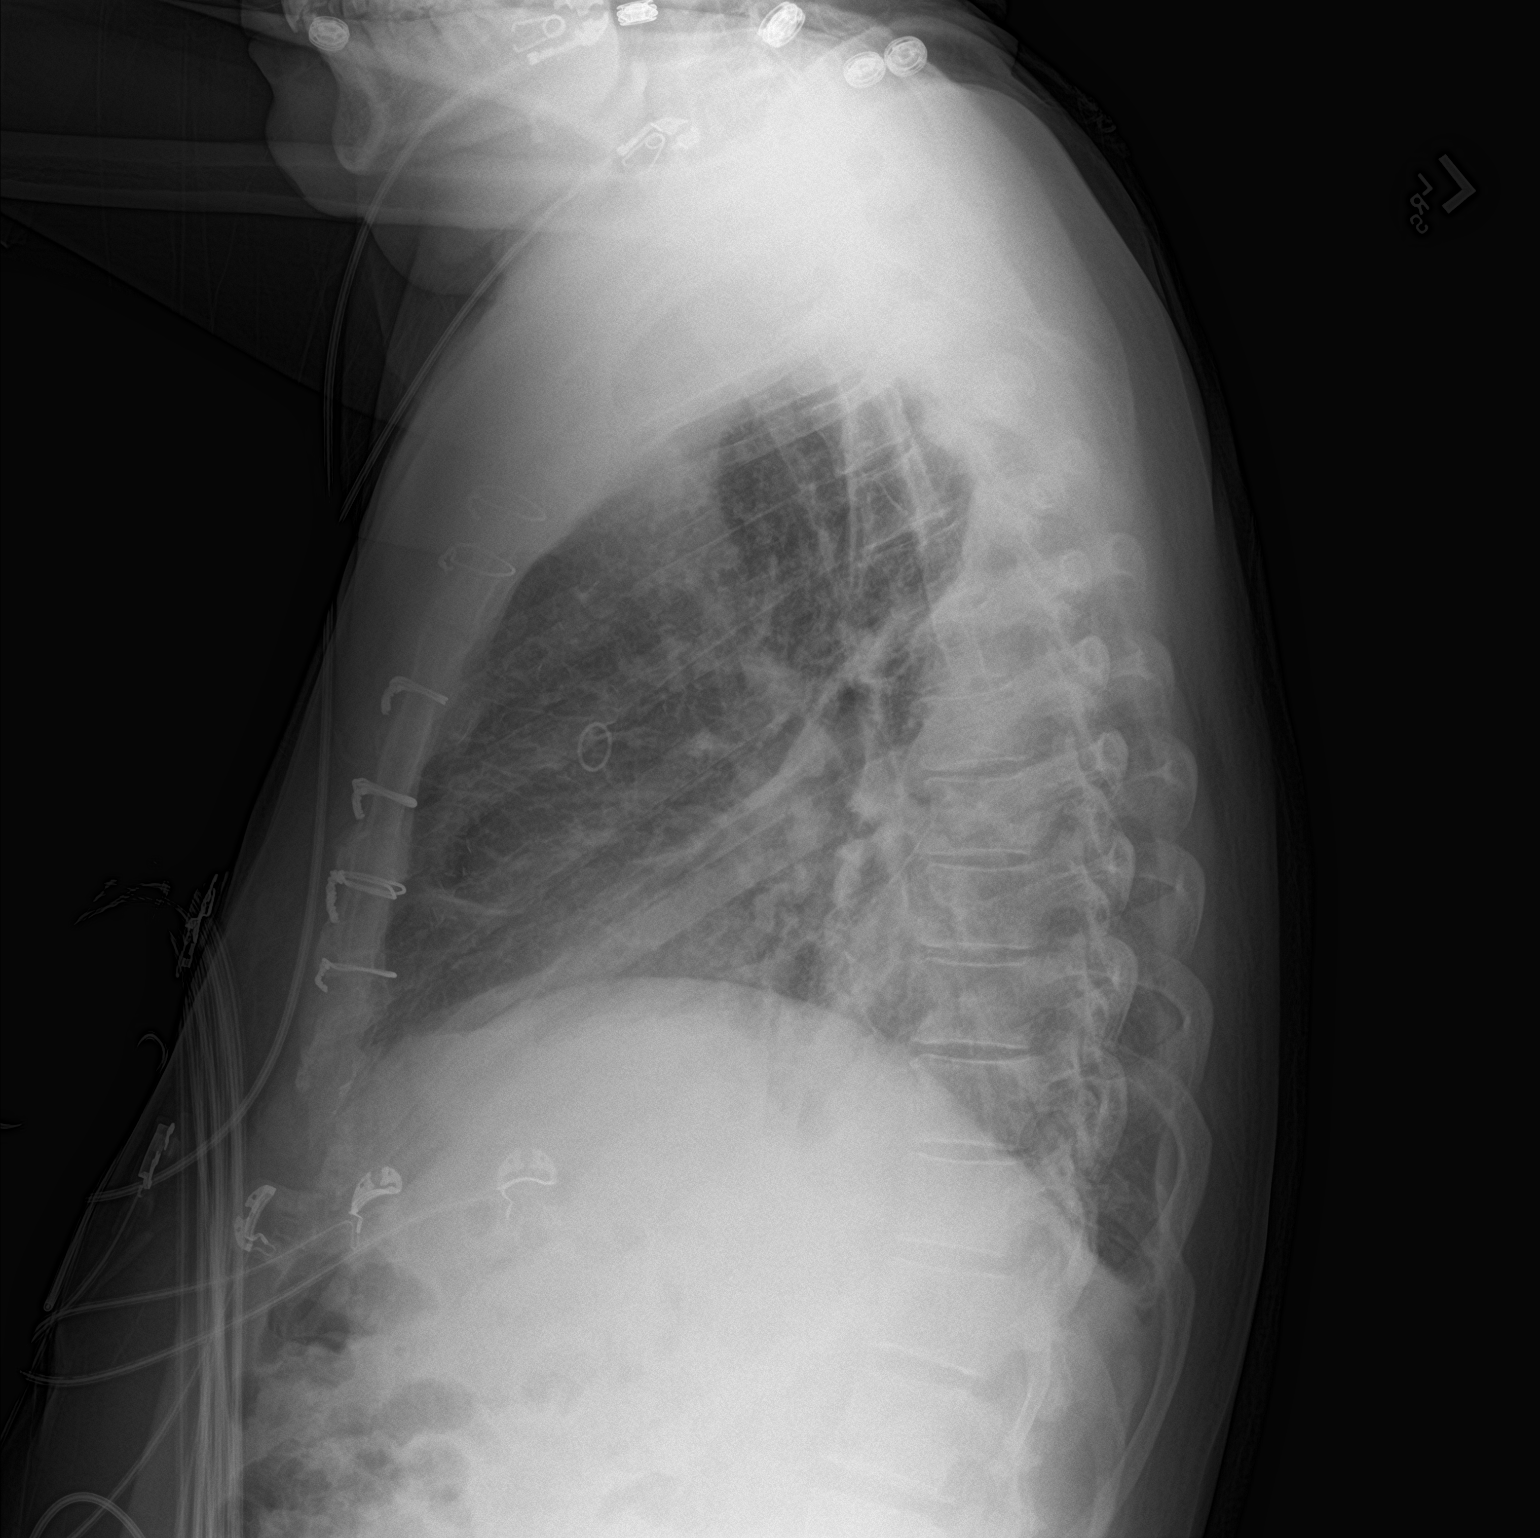

[2 of 2 positions shown; findings below may reference images not displayed]

FINDINGS: There has been interval removal of the right-sided central venous
line. There is overall better aeration of the lungs. Small left
pleural effusion and left lung base atelectatic changes similar to
prior radiograph. Streaky interstitial and hazy airspace density
throughout the left lung similar to prior radiograph. The right lung
is clear. Median sternotomy wires and postsurgical changes of CABG
noted. No acute osseous pathology.
IMPRESSION: Small left pleural effusion and left lung base atelectatic changes
similar to prior radiograph. There is overall improved aeration of
the lungs. Follow-up recommended.

## 2017-12-28 ENCOUNTER — Ambulatory Visit (INDEPENDENT_AMBULATORY_CARE_PROVIDER_SITE_OTHER): Payer: Medicare Other | Admitting: *Deleted

## 2017-12-28 ENCOUNTER — Encounter (HOSPITAL_COMMUNITY): Payer: Self-pay

## 2017-12-28 DIAGNOSIS — I255 Ischemic cardiomyopathy: Secondary | ICD-10-CM

## 2017-12-28 LAB — CUP PACEART INCLINIC DEVICE CHECK
Battery Remaining Longevity: 135 mo
Battery Voltage: 3.12 V
Brady Statistic RV Percent Paced: 0.01 %
Date Time Interrogation Session: 20190401115338
HighPow Impedance: 61 Ohm
Implantable Lead Implant Date: 20190319
Implantable Lead Location: 753860
Implantable Pulse Generator Implant Date: 20190319
Lead Channel Impedance Value: 399 Ohm
Lead Channel Impedance Value: 456 Ohm
Lead Channel Pacing Threshold Amplitude: 0.625 V
Lead Channel Pacing Threshold Pulse Width: 0.4 ms
Lead Channel Sensing Intrinsic Amplitude: 5.625 mV
Lead Channel Sensing Intrinsic Amplitude: 7.125 mV
Lead Channel Setting Pacing Amplitude: 3.5 V
Lead Channel Setting Pacing Pulse Width: 0.4 ms
Lead Channel Setting Sensing Sensitivity: 0.3 mV

## 2017-12-28 NOTE — Progress Notes (Signed)
Wound check appointment. Steri-strips removed. Wound without redness or edema. Incision edges approximated, wound well healed. Normal device function. Threshold, sensing, and impedance consistent with implant measurements. Device programmed at 3.5V for extra safety margin until 3 month visit. Histogram distribution appropriate for patient and level of activity. No mode switches or ventricular arrhythmias noted. Patient educated about wound care, arm mobility, lifting restrictions, shock plan. ROV with WC 6/25.

## 2017-12-30 ENCOUNTER — Encounter (HOSPITAL_COMMUNITY): Payer: Self-pay

## 2018-01-01 ENCOUNTER — Encounter (HOSPITAL_COMMUNITY): Payer: Self-pay

## 2018-01-04 ENCOUNTER — Encounter (HOSPITAL_COMMUNITY): Payer: Self-pay

## 2018-01-06 ENCOUNTER — Encounter (HOSPITAL_COMMUNITY): Payer: Self-pay

## 2018-01-07 ENCOUNTER — Ambulatory Visit (INDEPENDENT_AMBULATORY_CARE_PROVIDER_SITE_OTHER): Payer: Medicare Other | Admitting: Orthopedic Surgery

## 2018-01-07 ENCOUNTER — Ambulatory Visit (INDEPENDENT_AMBULATORY_CARE_PROVIDER_SITE_OTHER): Payer: Self-pay

## 2018-01-07 ENCOUNTER — Encounter (INDEPENDENT_AMBULATORY_CARE_PROVIDER_SITE_OTHER): Payer: Self-pay | Admitting: Orthopedic Surgery

## 2018-01-07 DIAGNOSIS — M25512 Pain in left shoulder: Secondary | ICD-10-CM

## 2018-01-07 DIAGNOSIS — M7502 Adhesive capsulitis of left shoulder: Secondary | ICD-10-CM | POA: Diagnosis not present

## 2018-01-07 NOTE — Progress Notes (Signed)
x

## 2018-01-08 ENCOUNTER — Encounter (HOSPITAL_COMMUNITY): Payer: Self-pay

## 2018-01-10 NOTE — Progress Notes (Signed)
Office Visit Note   Patient: Luis [name]           Date of Birth: Feb 02, 1946           MRN: [id number] Visit Date: 01/07/2018 Requested by: [name], MD [id number], [place] [address] PCP: [name], MD  Subjective: Chief Complaint  Patient presents with  . Left Shoulder - Pain    HPI: Patient presents for evaluation of left shoulder pain.  He had heart bypass surgery in June 2018.  He has had pain since then but it has gotten worse recently.  He describes decreased range of motion.  The pain will wake him from sleep at night.  3 weeks ago he had a defibrillator placed.  He has stopped Crestor.  He denies any history of injury.              ROS: All systems reviewed are negative as they relate to the chief complaint within the history of present illness.  Patient denies  fevers or chills.   Assessment & Plan: Visit Diagnoses:  1. Left shoulder pain, unspecified chronicity   2. Adhesive capsulitis of left shoulder     Plan: Impression is left frozen shoulder with fairly significant restriction of passive range of motion.  We discussed injection today.  He would prefer to try physical therapy and range of motion exercises.  We will get that arranged and I will see him back as needed.  Injection will be the next step.  Follow-Up Instructions: Return if symptoms worsen or fail to improve.   Orders:  Orders Placed This Encounter  Procedures  . XR Shoulder Left  . Ambulatory referral to Physical Therapy   No orders of the defined types were placed in this encounter.     Procedures: No procedures performed   Clinical Data: No additional findings.  Objective: Vital Signs: There were no vitals taken for this visit.  Physical Exam:   Constitutional: Patient appears well-developed HEENT:  Head: Normocephalic Eyes:EOM are normal Neck: Normal range of motion Cardiovascular: Normal rate Pulmonary/chest: Effort normal Neurologic: Patient is alert Skin:  Skin is warm Psychiatric: Patient has normal mood and affect    Ortho Exam: Orthopedic exam demonstrates good cervical spine range of motion.  No paresthesias C5-T1.  Radial pulse intact bilaterally.  Patient has good rotator cuff strength isolated infraspinatus supraspinatus and subscap muscle testing.  Does have restricted forward flexion to about 110 and isolated with humeral abduction only to about 60.  External rotation at 15 degrees of abduction is about 60 on the right compared to less than 30 on the left.  Specialty Comments:  No specialty comments available.  Imaging: No results found.   PMFS History: Patient Active Problem List   Diagnosis Date Noted  . Ischemic cardiomyopathy 12/15/2017  . Dyspnea 04/16/2017  . Acute combined systolic and diastolic heart failure ([facility]) 04/16/2017  . Pneumothorax on right   . Cardiogenic shock ([facility]) 03/28/2017  . 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 Acute respiratory failure with hypoxia ([facility]) 03/28/2017  . AKI (acute kidney injury) ([facility]) 03/28/2017  . LVAD (left ventricular assist device) present ([facility])   . Coronary artery disease 03/25/2017  . Chest pain due to CAD 03/24/2017  . 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   Past Medical History:  Diagnosis Date  . AICD (automatic cardioverter/defibrillator) present 12/15/2017  . Chronic kidney disease    kidney stones; noted per H&P per Dr [name] 02/06/2015 chronic kidney disease stage  II  . Diabetes mellitus without complication ([facility])    per H&P Dr [name] noted to be prediabetic 02/06/2015; pt denies DM on 12/15/2017  . Dyslipidemia   . History of hepatomegaly    with fatty liver discussed per H&P per Dr [name] 02/06/2015 secondary to abd ultrasound   . History of kidney stones   . Sleep apnea    AHI 17 on sleep study 08/23/2013 pt states was given CPAP machine but sent it back - does not use now 12/15/2017  . Vitamin D deficiency    as noted per H&P per Dr [name] 02/06/2015      Family History  Problem Relation Age of Onset  . Healthy Mother   . Healthy Father   . Healthy Sister   . Healthy Brother     Past Surgical History:  Procedure Laterality Date  . ASD REPAIR N/A 03/25/2017   Procedure: ATRIAL SEPTAL DEFECT (ASD) REPAIR;  Surgeon: [name], MD;  Location: [facility];  Service: Open Heart Surgery;  Laterality: N/A;  . CARDIAC DEFIBRILLATOR PLACEMENT  12/15/2017  . CORONARY ARTERY BYPASS GRAFT N/A 03/25/2017   Procedure: CORONARY ARTERY BYPASS GRAFTING (CABG) x 2 using left internal mammary artery and right greater saphenous leg vein using endosciope.;  Surgeon: [name], MD;  Location: [facility];  Service: Open Heart Surgery;  Laterality: N/A;  . CYSTOSCOPY W/ RETROGRADES Left 03/26/2015   Procedure: CYSTOSCOPY WITH RETROGRADE PYELOGRAM;  Surgeon: [name], MD;  Location: [facility];  Service: Urology;  Laterality: Left;  . ICD IMPLANT N/A 12/15/2017   Procedure: ICD IMPLANT;  Surgeon: [name], MD;  Location: [facility];  Service: Cardiovascular;  Laterality: N/A;  . LEFT HEART CATH AND CORONARY ANGIOGRAPHY N/A 03/25/2017   Procedure: Left Heart Cath and Coronary Angiography;  Surgeon: [name], MD;  Location: [facility];  Service: Cardiovascular;  Laterality: N/A;  . PLACEMENT OF IMPELLA LEFT VENTRICULAR ASSIST DEVICE  03/25/2017   Procedure: PLACEMENT OF IMPELLA LEFT VENTRICULAR ASSIST DEVICE;  Surgeon: [name], MD;  Location: [facility];  Service: Open Heart Surgery;;  . REMOVAL OF IMPELLA LEFT VENTRICULAR ASSIST DEVICE N/A 03/31/2017   Procedure: REMOVAL OF IMPELLA LEFT VENTRICULAR ASSIST DEVICE;  Surgeon: [name], MD;  Location: [facility];  Service: Open Heart Surgery;  Laterality: N/A;  . ROBOT ASSISTED PYELOPLASTY Left 03/26/2015   Procedure: ROBOTIC ASSISTED PYELOPLASTY WITH STENT PLACEMENT, REMOVAL OF STONE AND CYST DECORTICATION;  Surgeon: [name], MD;  Location: [facility];  Service: Urology;  Laterality:  Left;  . TEE WITHOUT CARDIOVERSION N/A 03/25/2017   Procedure: TRANSESOPHAGEAL ECHOCARDIOGRAM (TEE);  Surgeon: [name], [name], MD;  Location: [facility];  Service: Open Heart Surgery;  Laterality: N/A;  . TEE WITHOUT CARDIOVERSION N/A 03/31/2017   Procedure: TRANSESOPHAGEAL ECHOCARDIOGRAM (TEE);  Surgeon: [name], [name], MD;  Location: [facility];  Service: Open Heart Surgery;  Laterality: N/A;   Social History   Occupational History  . Not on file  Tobacco Use  . Smoking status: Never Smoker  . Smokeless tobacco: Never Used  Substance and Sexual Activity  . Alcohol use: No  . Drug use: No  . Sexual activity: Not on file

## 2018-01-11 ENCOUNTER — Encounter (HOSPITAL_COMMUNITY): Payer: Self-pay

## 2018-01-13 ENCOUNTER — Encounter (HOSPITAL_COMMUNITY): Payer: Self-pay

## 2018-01-14 IMAGING — DX DG CHEST 2V
2 series · 2 of 2 positions shown · non-contrast
Comparison: Chest radiograph 04/16/2017

CLINICAL DATA: Weakness and shortness of breath

EXAM:
CHEST  2 VIEW

[dg chest 2 view (1 of 2)]
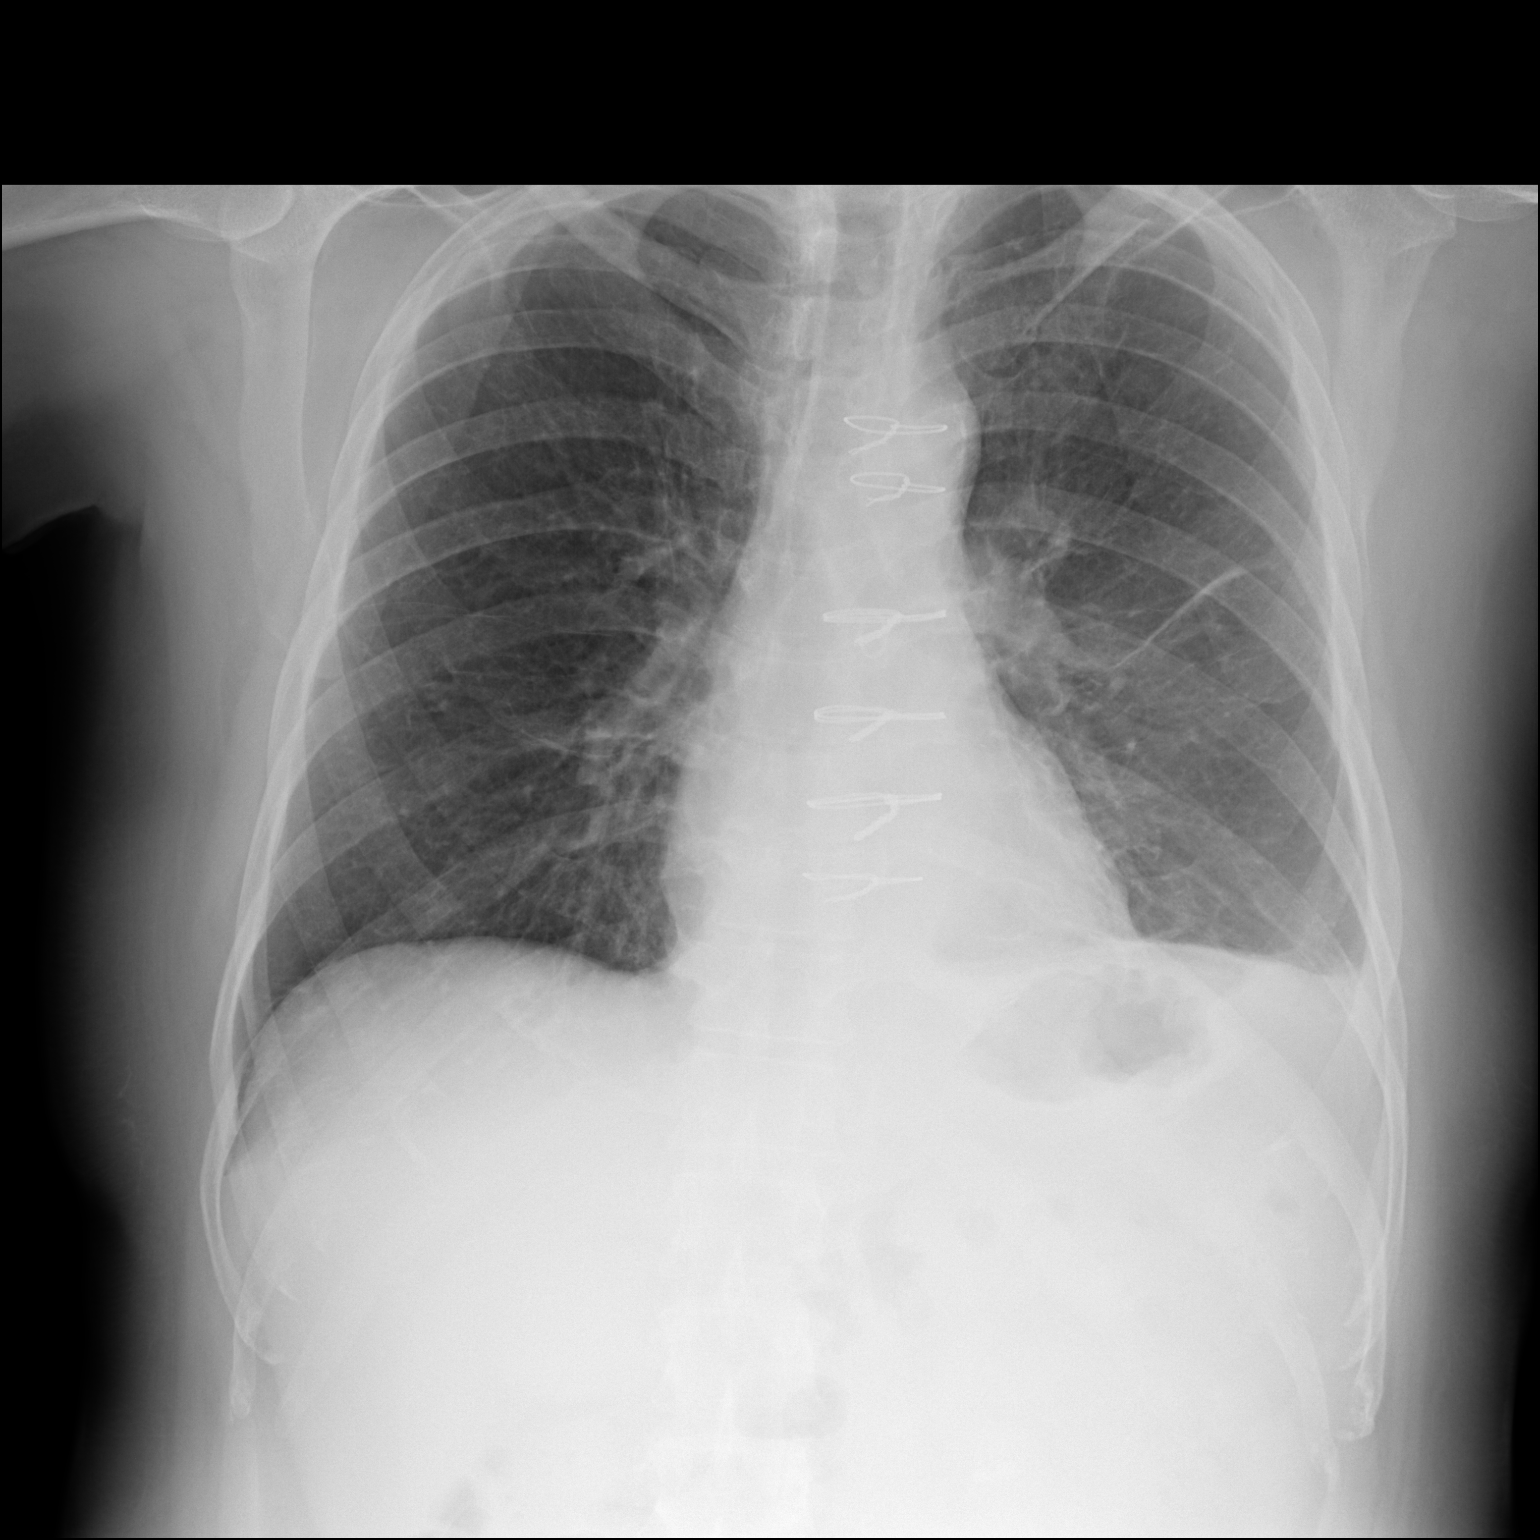

[dg chest 2 view (2 of 2)]
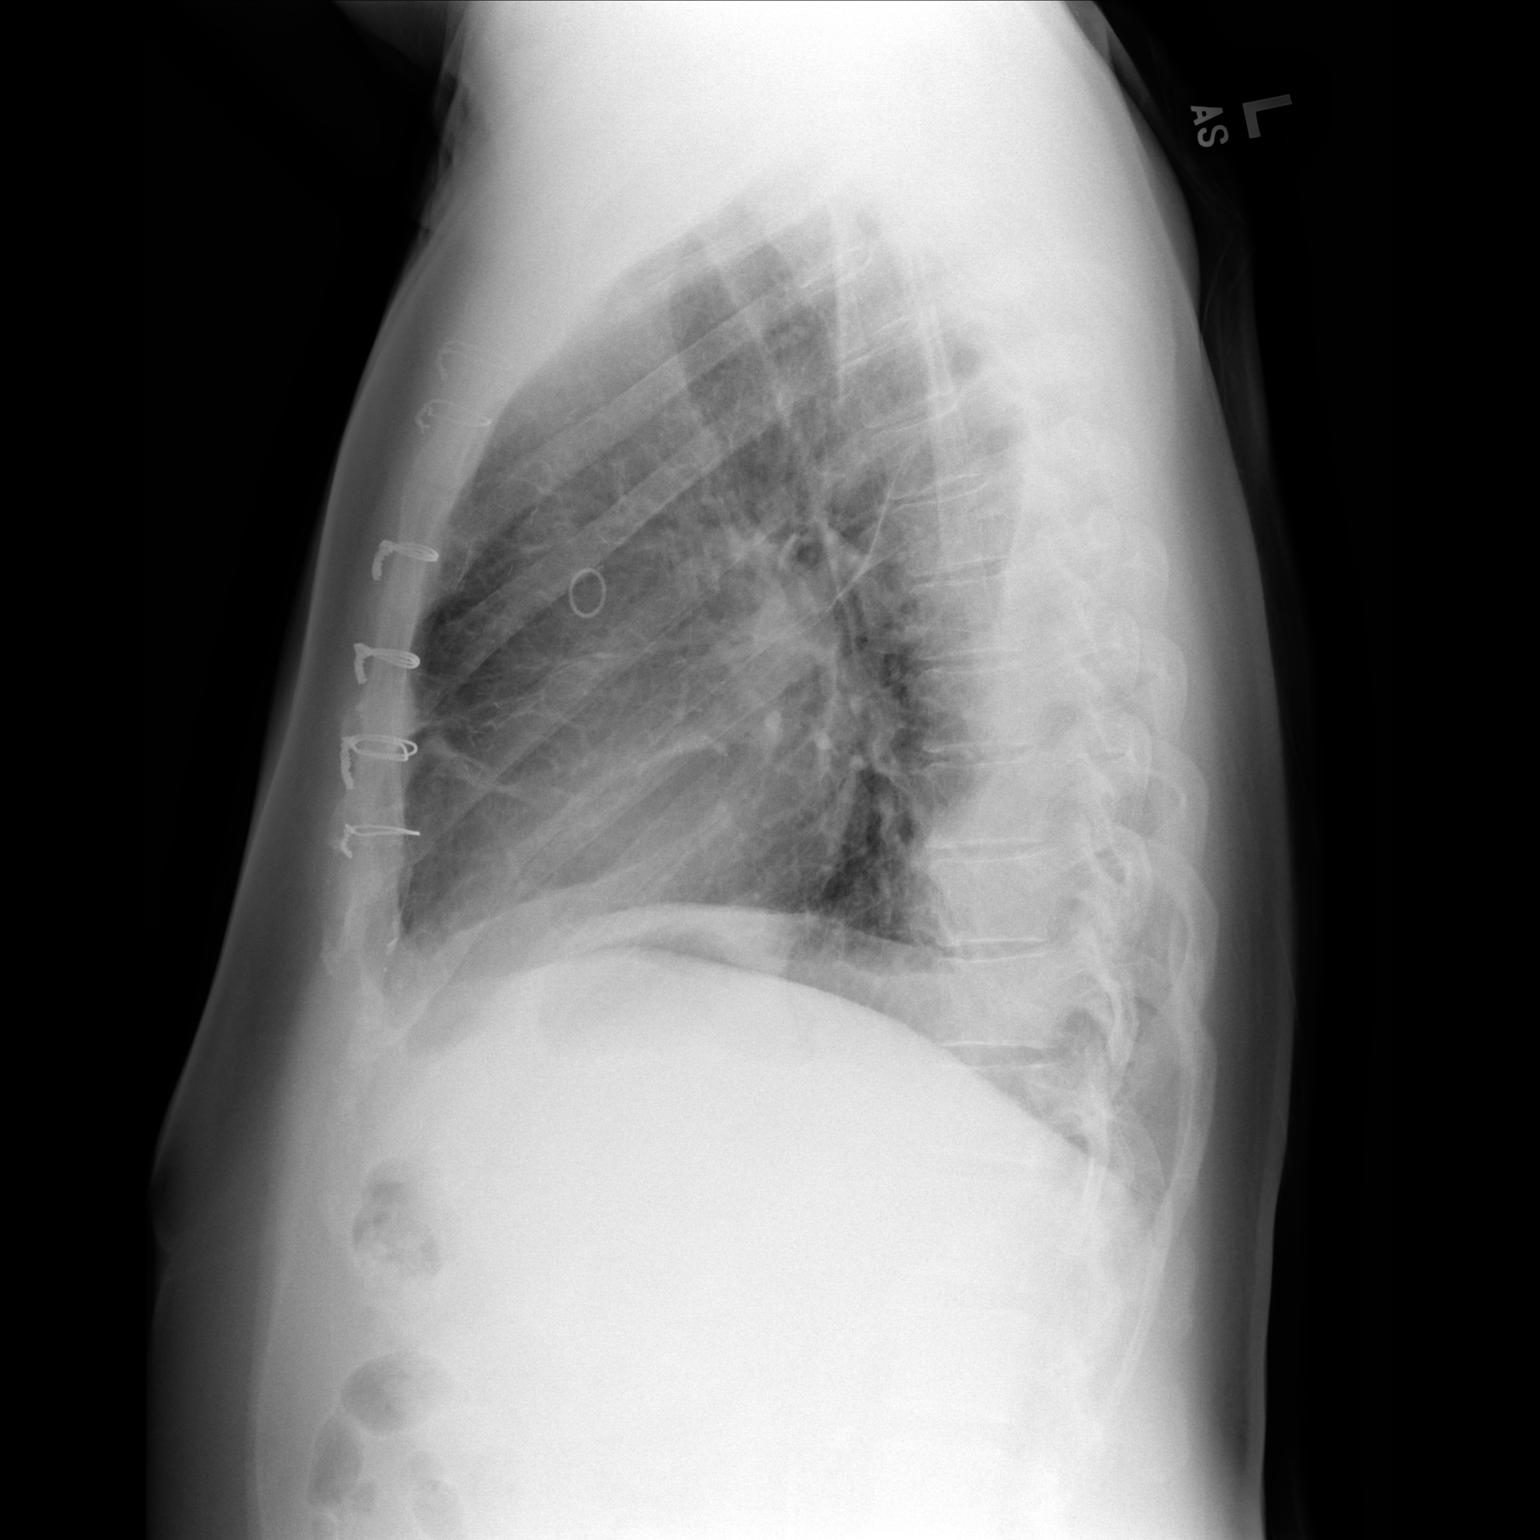

[2 of 2 positions shown; findings below may reference images not displayed]

FINDINGS: Median sternotomy wires are unchanged. Mild left perifissural
atelectasis. There is unchanged blunting of the left costophrenic
angle. The heart size and mediastinal contours are within normal
limits. Both lungs are clear. The visualized skeletal structures are
unremarkable.
IMPRESSION: No focal airspace disease. Small left pleural effusion versus
chronic pleural scarring.

## 2018-01-15 ENCOUNTER — Encounter (HOSPITAL_COMMUNITY): Payer: Self-pay

## 2018-01-18 ENCOUNTER — Encounter (HOSPITAL_COMMUNITY): Payer: Self-pay

## 2018-01-20 ENCOUNTER — Encounter (HOSPITAL_COMMUNITY): Payer: Self-pay

## 2018-01-22 ENCOUNTER — Encounter (HOSPITAL_COMMUNITY): Payer: Self-pay

## 2018-01-25 ENCOUNTER — Encounter (HOSPITAL_COMMUNITY): Payer: Self-pay

## 2018-02-01 ENCOUNTER — Ambulatory Visit: Payer: Medicare Other | Attending: Orthopedic Surgery | Admitting: Physical Therapy

## 2018-02-01 ENCOUNTER — Encounter: Payer: Self-pay | Admitting: Physical Therapy

## 2018-02-01 DIAGNOSIS — M6281 Muscle weakness (generalized): Secondary | ICD-10-CM | POA: Diagnosis present

## 2018-02-01 DIAGNOSIS — M25612 Stiffness of left shoulder, not elsewhere classified: Secondary | ICD-10-CM | POA: Diagnosis present

## 2018-02-01 DIAGNOSIS — M25512 Pain in left shoulder: Secondary | ICD-10-CM | POA: Diagnosis present

## 2018-02-01 NOTE — Therapy (Signed)
[place] [address] [place], [place], [address] Phone: [phone]   Fax:  [phone]  Physical Therapy Evaluation  Patient Details  Name: Luis [name] MRN: [id number] Date of Birth: May 17, 1946 Referring Provider: Marlou Sa   Encounter Date: 02/01/2018  PT End of Session - 02/01/18 1130    Visit Number  1    Date for PT Re-Evaluation  04/03/18    PT Start Time  1100    PT Stop Time  1145    PT Time Calculation (min)  45 min    Activity Tolerance  Patient tolerated treatment well;Patient limited by pain    Behavior During Therapy  [facility] for tasks assessed/performed       Past Medical History:  Diagnosis Date  . AICD (automatic cardioverter/defibrillator) present 12/15/2017  . Chronic kidney disease    kidney stones; noted per H&P per Dr [name] 02/06/2015 chronic kidney disease stage II  . Diabetes mellitus without complication ([place])    per H&P Dr [name] noted to be prediabetic 02/06/2015; pt denies DM on 12/15/2017  . Dyslipidemia   . History of hepatomegaly    with fatty liver discussed per H&P per Dr [name] 02/06/2015 secondary to abd ultrasound   . History of kidney stones   . Sleep apnea    AHI 17 on sleep study 08/23/2013 pt states was given CPAP machine but sent it back - does not use now 12/15/2017  . Vitamin D deficiency    as noted per H&P per Dr [name] 02/06/2015    Past Surgical History:  Procedure Laterality Date  . ASD REPAIR N/A 03/25/2017   Procedure: ATRIAL SEPTAL DEFECT (ASD) REPAIR;  Surgeon: [name], MD;  Location: [place];  Service: Open Heart Surgery;  Laterality: N/A;  . CARDIAC DEFIBRILLATOR PLACEMENT  12/15/2017  . CORONARY ARTERY BYPASS GRAFT N/A 03/25/2017   Procedure: CORONARY ARTERY BYPASS GRAFTING (CABG) x 2 using left internal mammary artery and right greater saphenous leg vein using endosciope.;  Surgeon: [name], MD;  Location: [place];  Service: Open Heart Surgery;   Laterality: N/A;  . CYSTOSCOPY W/ RETROGRADES Left 03/26/2015   Procedure: CYSTOSCOPY WITH RETROGRADE PYELOGRAM;  Surgeon: [name], MD;  [facility];  Service: Urology;  Laterality: Left;  . ICD IMPLANT N/A 12/15/2017   Procedure: ICD IMPLANT;  Surgeon: [name], MD;  Location: [place] [facility];  Service: Cardiovascular;  Laterality: N/A;  . LEFT HEART CATH AND CORONARY ANGIOGRAPHY N/A 03/25/2017   Procedure: Left Heart Cath and Coronary Angiography;  Surgeon: [name], MD;  Location: [place] [facility];  Service: Cardiovascular;  Laterality: N/A;  . PLACEMENT OF IMPELLA LEFT VENTRICULAR ASSIST DEVICE  03/25/2017   Procedure: PLACEMENT OF IMPELLA LEFT VENTRICULAR ASSIST DEVICE;  Surgeon: [name], MD;  Location: [place];  Service: Open Heart Surgery;;  . REMOVAL OF IMPELLA LEFT VENTRICULAR ASSIST DEVICE N/A 03/31/2017   Procedure: REMOVAL OF IMPELLA LEFT VENTRICULAR ASSIST DEVICE;  Surgeon: [name], MD;  Location: [place];  Service: Open Heart Surgery;  Laterality: N/A;  . ROBOT ASSISTED PYELOPLASTY Left 03/26/2015   Procedure: ROBOTIC ASSISTED PYELOPLASTY WITH STENT PLACEMENT, REMOVAL OF STONE AND CYST DECORTICATION;  Surgeon: [name], MD;  [facility];  Service: Urology;  Laterality: Left;  . TEE WITHOUT CARDIOVERSION N/A 03/25/2017   Procedure: TRANSESOPHAGEAL ECHOCARDIOGRAM (TEE);  Surgeon: [name], [name], MD;  Location: [place];  Service: Open Heart Surgery;  Laterality: N/A;  . TEE WITHOUT CARDIOVERSION N/A 03/31/2017   Procedure: TRANSESOPHAGEAL ECHOCARDIOGRAM (TEE);  Surgeon: [name], [name], MD;  Location: [place];  Service: Open Heart Surgery;  Laterality: N/A;    There were no vitals filed for this visit.   Subjective Assessment - 02/01/18 1104    Subjective  Patient reports that he has had two open heart surgeries in the past year.  He reports that due to the surgeries he did not use the arms much and reports that he changed medications and  now reports from MD that he has frozen shoulders    Limitations  Lifting;House hold activities    Patient Stated Goals  have better ROM    Currently in Pain?  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   No    Is the patient reluctant to leave their home because of a fear of falling?   No      Home Environment   Additional Comments  reports that he does not have to do any housework      Prior Function   Level of Independence  Independent    Vocation  Retired    Leisure  no exercise      Mining engineer Comments  fwd head, rounded shoulders      ROM / Strength   AROM / PROM / Strength  AROM;PROM;Strength      AROM   Overall AROM Comments  a lot of compenation and pain with the left AROM    AROM Assessment Site  Shoulder    Right/Left Shoulder  Right;Left    Right Shoulder Flexion  155 Degrees    Right Shoulder ABduction  10 Degrees    Right Shoulder Internal Rotation  25 Degrees    Right Shoulder External Rotation  80 Degrees    Left Shoulder Flexion  90 Degrees    Left Shoulder ABduction  60 Degrees    Left Shoulder Internal Rotation   20 Degrees    Left Shoulder External Rotation  20 Degrees      PROM   Overall PROM Comments  left shoulder very painful at end range    PROM Assessment Site  Shoulder    Right/Left Shoulder  Right;Left    Right Shoulder  Flexion  176 Degrees    Right Shoulder ABduction  175 Degrees    Right Shoulder Internal Rotation  45 Degrees    Right Shoulder External Rotation  90 Degrees    Left Shoulder Flexion  106 Degrees    Left Shoulder ABduction  90 Degrees    Left Shoulder Internal Rotation  30 Degrees    Left Shoulder External Rotation  30 Degrees      Strength   Overall Strength Comments  unable to go to end range, but in his range he is very painful with any resisted motions      Palpation   Palpation comment  he is sore and tender in the left shoulder  and in the left upper arm                Objective measurements completed on examination: See above findings.                PT Short Term Goals - 02/01/18 1140      PT SHORT TERM GOAL #1   Title  independent with initial HEP    Time  2    Period  Weeks    Status  New        PT Long Term Goals - 02/01/18 1140      PT LONG TERM GOAL #1   Title  understand posture and body mechanics for shoulder issues    Time  8    Period  Weeks    Status  New      PT LONG TERM GOAL #2   Title  decrease resting pain 50%    Time  8    Period  Weeks    Status  New      PT LONG TERM GOAL #3   Title  report no difficulty doing his hair or getting dressed    Time  8    Status  New      PT LONG TERM GOAL #4   Title  increase left shoulder Active flexion to 130 degrees    Time  8    Period  Weeks    Status  New      PT LONG TERM GOAL #5   Title  increase active left shoulder IR to 60 degrees    Time  8    Period  Weeks    Status  New             Plan - 02/01/18 1130    Clinical Impression Statement  Patient has had two open heart surgeries in the past year, recently had an ICD implanted.  He  reports some difficulty with ROM of both shoulders, the left is much worse than the right, He is missing 50-66% of his ROM on the left compared to the right.  He does c/o constant pain, he is tender in the left shoulder area    History and Personal Factors relevant to plan of care:  ICD implant, two open heart surgeries in the past year    Clinical Presentation  Stable    Clinical Decision Making  Moderate    Rehab Potential  Good    PT Frequency  2x / week    PT Duration  8 weeks    PT Treatment/Interventions  ADLs/Self Care Home Management;Cryotherapy;Moist Heat;Iontophoresis 4mg /ml Dexamethasone;Neuromuscular re-education;Therapeutic exercise;Therapeutic activities;Patient/family education;Manual techniques;Passive range of motion    PT Next Visit Plan  slowly add exercises, work on ROM of the left  shoudler    Consulted and Agree with Plan of Care  Patient       Patient will benefit from skilled therapeutic intervention in order to improve the following deficits and impairments:  Decreased range of motion, Impaired UE functional use, Increased muscle spasms, Pain, Improper body mechanics, Decreased strength, Postural dysfunction  Visit Diagnosis: Stiffness of left shoulder, not elsewhere classified - Plan: PT plan of care cert/re-cert  Acute pain of left shoulder - Plan: PT plan of care cert/re-cert  Muscle weakness (generalized) - Plan: PT plan of care cert/re-cert     Problem List Patient Active Problem List   Diagnosis Date Noted  . Ischemic cardiomyopathy 12/15/2017  . Dyspnea 04/16/2017  . Acute combined systolic and diastolic heart failure ([place]) 04/16/2017  . Pneumothorax on right   . Cardiogenic shock ([place]) 03/28/2017  . 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 Acute respiratory failure with hypoxia ([place]) 03/28/2017  . AKI (acute kidney injury) ([place]) 03/28/2017  . LVAD (left ventricular assist device) present ([place])   . Coronary artery disease 03/25/2017  . Chest pain due to CAD  03/24/2017  . 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    Sumner Boast., PT 02/01/2018, 11:44 AM  [place] [address] [place], [place], [address] Phone: [phone]   Fax:  [phone]  Name: Luis [name] MRN: [id number] Date of Birth: [birth year]

## 2018-02-01 NOTE — Patient Instructions (Signed)
Access Code: [email or web address]  URL: [email or web address]  Date: 02/01/2018  Prepared by: Lum Babe   Exercises  Supine Shoulder Flexion Extension AAROM with Dowel - 10 reps - 1 sets - 3x daily - 7x weekly  Supine Shoulder External Rotation in 45 Degrees Abduction AAROM with Dowel - 10 reps - 1 sets - 3x daily - 7x weekly  Supine Shoulder Protraction with Dowel - 10 reps - 1 sets - 3x daily - 7x weekly  Supine Shoulder Abduction AAROM with Dowel - 10 reps - 1 sets - 3x daily - 7x weekly  Standing Shoulder Extension with Dowel - 10 reps - 1 sets - 3x daily - 7x weekly  Shoulder Scaption AAROM with Dowel - 10 reps - 1 sets - 3x daily - 7x weekly  Standing Shoulder Internal Rotation Stretch with Towel - 10 reps - 1 sets - 3x daily - 7x weekly  Standing Shoulder Posterior Capsule Stretch - 10 reps - 1 sets - 3x daily - 7x weekly  Standing Shoulder Flexion Wall Walk - 10 reps - 1 sets - 5 hold - 3x daily - 7x weekly  Standing Shoulder Scaption Wall Walk - 10 reps - 1 sets - 5 hold - 3x daily - 7x weekly  Patient Education  Frozen Shoulder  Ice  Heat

## 2018-02-05 ENCOUNTER — Ambulatory Visit: Payer: Medicare Other | Admitting: Physical Therapy

## 2018-02-05 ENCOUNTER — Encounter: Payer: Self-pay | Admitting: Physical Therapy

## 2018-02-05 DIAGNOSIS — M6281 Muscle weakness (generalized): Secondary | ICD-10-CM

## 2018-02-05 DIAGNOSIS — M25512 Pain in left shoulder: Secondary | ICD-10-CM

## 2018-02-05 DIAGNOSIS — M25612 Stiffness of left shoulder, not elsewhere classified: Secondary | ICD-10-CM | POA: Diagnosis not present

## 2018-02-05 NOTE — Therapy (Signed)
[place], [place], [address] Phone: [phone]   Fax:  [phone]  Physical Therapy Treatment  Patient Details  Name: Luis [name] MRN: [id number] Date of Birth: 1945/11/21 Referring Provider: Marlou Sa   Encounter Date: 02/05/2018  PT End of Session - 02/05/18 1206    Visit Number  2    Date for PT Re-Evaluation  04/03/18    PT Start Time  1100    PT Stop Time  1145    PT Time Calculation (min)  45 min    Activity Tolerance  Patient tolerated treatment well;Patient limited by pain    Behavior During Therapy  [facility] for tasks assessed/performed       Past Medical History:  Diagnosis Date   AICD (automatic cardioverter/defibrillator) present 12/15/2017   Chronic kidney disease    kidney stones; noted per H&P per Dr [name] 02/06/2015 chronic kidney disease stage II   Diabetes mellitus without complication ([place])    per H&P Dr [name] noted to be prediabetic 02/06/2015; pt denies DM on 12/15/2017   Dyslipidemia    History of hepatomegaly    with fatty liver discussed per H&P per Dr [name] 02/06/2015 secondary to abd ultrasound    History of kidney stones    Sleep apnea    AHI 17 on sleep study 08/23/2013 pt states was given CPAP machine but sent it back - does not use now 12/15/2017   Vitamin D deficiency    as noted per H&P per Dr [name] 02/06/2015    Past Surgical History:  Procedure Laterality Date   ASD REPAIR N/A 03/25/2017   Procedure: ATRIAL SEPTAL DEFECT (ASD) REPAIR;  Surgeon: [name], MD;  Location: [place];  Service: Open Heart Surgery;  Laterality: N/A;   CARDIAC DEFIBRILLATOR PLACEMENT  12/15/2017   CORONARY ARTERY BYPASS GRAFT N/A 03/25/2017   Procedure: CORONARY ARTERY BYPASS GRAFTING (CABG) x 2 using left internal mammary artery and right greater saphenous leg vein using endosciope.;  Surgeon: [name], MD;  Location: [place];  Service: Open Heart Surgery;   Laterality: N/A;   CYSTOSCOPY W/ RETROGRADES Left 03/26/2015   Procedure: CYSTOSCOPY WITH RETROGRADE PYELOGRAM;  Surgeon: [name], MD;  [facility];  Service: Urology;  Laterality: Left;   ICD IMPLANT N/A 12/15/2017   Procedure: ICD IMPLANT;  Surgeon: [name], MD;  Location: [place] [facility];  Service: Cardiovascular;  Laterality: N/A;   LEFT HEART CATH AND CORONARY ANGIOGRAPHY N/A 03/25/2017   Procedure: Left Heart Cath and Coronary Angiography;  Surgeon: [name], MD;  Location: [place] [facility];  Service: Cardiovascular;  Laterality: N/A;   PLACEMENT OF IMPELLA LEFT VENTRICULAR ASSIST DEVICE  03/25/2017   Procedure: PLACEMENT OF IMPELLA LEFT VENTRICULAR ASSIST DEVICE;  Surgeon: [name], MD;  Location: [place];  Service: Open Heart Surgery;;   REMOVAL OF IMPELLA LEFT VENTRICULAR ASSIST DEVICE N/A 03/31/2017   Procedure: REMOVAL OF IMPELLA LEFT VENTRICULAR ASSIST DEVICE;  Surgeon: [name], MD;  Location: [place];  Service: Open Heart Surgery;  Laterality: N/A;   ROBOT ASSISTED PYELOPLASTY Left 03/26/2015   Procedure: ROBOTIC ASSISTED PYELOPLASTY WITH STENT PLACEMENT, REMOVAL OF STONE AND CYST DECORTICATION;  Surgeon: [name], MD;  [facility];  Service: Urology;  Laterality: Left;   TEE WITHOUT CARDIOVERSION N/A 03/25/2017   Procedure: TRANSESOPHAGEAL ECHOCARDIOGRAM (TEE);  Surgeon: [name], [name], MD;  Location: [place];  Service: Open Heart Surgery;  Laterality: N/A;   TEE WITHOUT CARDIOVERSION N/A 03/31/2017   Procedure: TRANSESOPHAGEAL ECHOCARDIOGRAM (TEE);  Surgeon: [name], [name], MD;  Location: [place];  Service: Open Heart Surgery;  Laterality: N/A;    There were no vitals filed for this visit.  Subjective Assessment - 02/05/18 1203    Subjective  Reports ongoing stiffness, pain, weakness throughout His Left shoulder with functional  deficits directly related to lack of motion.  Has been doing his HEP, and fdeels his motion is  improving.                         [facility] Adult PT Treatment/Exercise - 02/05/18 0001      Exercises   Exercises  Shoulder      Shoulder Exercises: Supine   External Rotation  AAROM;Left;15 reps 5" hold    Flexion  AAROM;Left;15 reps 5" hold      Shoulder Exercises: Seated   Other Seated Exercises  scapular retraction 15x5"    Other Seated Exercises  bicep curl 15x      Manual Therapy   Manual Therapy  Passive ROM;Joint mobilization;Myofascial release    Manual therapy comments  soft tissue massage to subscap    Joint Mobilization  Gr III post/ inf GH mobs    Myofascial Release  subscap release w/ PROM ER    Passive ROM  all planes with end ROM hold, increased pain @ end ROM throughout               PT Short Term Goals - 02/01/18 1140      PT SHORT TERM GOAL #1   Title  independent with initial HEP    Time  2    Period  Weeks    Status  New        PT Long Term Goals - 02/01/18 1140      PT LONG TERM GOAL #1   Title  understand posture and body mechanics for shoulder issues    Time  8    Period  Weeks    Status  New      PT LONG TERM GOAL #2   Title  decrease resting pain 50%    Time  8    Period  Weeks    Status  New      PT LONG TERM GOAL #3   Title  report no difficulty doing his hair or getting dressed    Time  8    Status  New      PT LONG TERM GOAL #4   Title  increase left shoulder Active flexion to 130 degrees    Time  8    Period  Weeks    Status  New      PT LONG TERM GOAL #5   Title  increase active left shoulder IR to 60 degrees    Time  8    Period  Weeks    Status  New            Plan - 02/05/18 1208    Clinical Impression Statement  End ROM pain throughout.  Limited ROM and soft tissue flexibility with hypomobile joint mobility.         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

## 2018-02-08 ENCOUNTER — Ambulatory Visit: Payer: Medicare Other | Admitting: Physical Therapy

## 2018-02-08 ENCOUNTER — Encounter: Payer: Self-pay | Admitting: Physical Therapy

## 2018-02-08 DIAGNOSIS — M6281 Muscle weakness (generalized): Secondary | ICD-10-CM

## 2018-02-08 DIAGNOSIS — M25612 Stiffness of left shoulder, not elsewhere classified: Secondary | ICD-10-CM

## 2018-02-08 DIAGNOSIS — M25512 Pain in left shoulder: Secondary | ICD-10-CM

## 2018-02-08 NOTE — Therapy (Signed)
[place], [place], [address] Phone: [phone]   Fax:  [phone]  Physical Therapy Treatment  Patient Details  Name: Luis [name] MRN: [id number] Date of Birth: Jun 07, 1946 Referring Provider: Marlou Sa   Encounter Date: 02/08/2018  PT End of Session - 02/08/18 1222    Visit Number  3    Date for PT Re-Evaluation  04/03/18    PT Start Time  5176    PT Stop Time  1225    PT Time Calculation (min)  40 min    Activity Tolerance  Patient tolerated treatment well;Patient limited by pain    Behavior During Therapy  [facility] for tasks assessed/performed       Past Medical History:  Diagnosis Date  . AICD (automatic cardioverter/defibrillator) present 12/15/2017  . Chronic kidney disease    kidney stones; noted per H&P per Dr [name] 02/06/2015 chronic kidney disease stage II  . Diabetes mellitus without complication ([place])    per H&P Dr [name] noted to be prediabetic 02/06/2015; pt denies DM on 12/15/2017  . Dyslipidemia   . History of hepatomegaly    with fatty liver discussed per H&P per Dr [name] 02/06/2015 secondary to abd ultrasound   . History of kidney stones   . Sleep apnea    AHI 17 on sleep study 08/23/2013 pt states was given CPAP machine but sent it back - does not use now 12/15/2017  . Vitamin D deficiency    as noted per H&P per Dr [name] 02/06/2015    Past Surgical History:  Procedure Laterality Date  . ASD REPAIR N/A 03/25/2017   Procedure: ATRIAL SEPTAL DEFECT (ASD) REPAIR;  Surgeon: [name], MD;  Location: [place];  Service: Open Heart Surgery;  Laterality: N/A;  . CARDIAC DEFIBRILLATOR PLACEMENT  12/15/2017  . CORONARY ARTERY BYPASS GRAFT N/A 03/25/2017   Procedure: CORONARY ARTERY BYPASS GRAFTING (CABG) x 2 using left internal mammary artery and right greater saphenous leg vein using endosciope.;  Surgeon: [name], MD;  Location: [place];  Service: Open Heart Surgery;   Laterality: N/A;  . CYSTOSCOPY W/ RETROGRADES Left 03/26/2015   Procedure: CYSTOSCOPY WITH RETROGRADE PYELOGRAM;  Surgeon: [name], MD;  [facility];  Service: Urology;  Laterality: Left;  . ICD IMPLANT N/A 12/15/2017   Procedure: ICD IMPLANT;  Surgeon: [name], MD;  Location: [place] [facility];  Service: Cardiovascular;  Laterality: N/A;  . LEFT HEART CATH AND CORONARY ANGIOGRAPHY N/A 03/25/2017   Procedure: Left Heart Cath and Coronary Angiography;  Surgeon: [name], MD;  Location: [place] [facility];  Service: Cardiovascular;  Laterality: N/A;  . PLACEMENT OF IMPELLA LEFT VENTRICULAR ASSIST DEVICE  03/25/2017   Procedure: PLACEMENT OF IMPELLA LEFT VENTRICULAR ASSIST DEVICE;  Surgeon: [name], MD;  Location: [place];  Service: Open Heart Surgery;;  . REMOVAL OF IMPELLA LEFT VENTRICULAR ASSIST DEVICE N/A 03/31/2017   Procedure: REMOVAL OF IMPELLA LEFT VENTRICULAR ASSIST DEVICE;  Surgeon: [name], MD;  Location: [place];  Service: Open Heart Surgery;  Laterality: N/A;  . ROBOT ASSISTED PYELOPLASTY Left 03/26/2015   Procedure: ROBOTIC ASSISTED PYELOPLASTY WITH STENT PLACEMENT, REMOVAL OF STONE AND CYST DECORTICATION;  Surgeon: [name], MD;  [facility];  Service: Urology;  Laterality: Left;  . TEE WITHOUT CARDIOVERSION N/A 03/25/2017   Procedure: TRANSESOPHAGEAL ECHOCARDIOGRAM (TEE);  Surgeon: [name], [name], MD;  Location: [place];  Service: Open Heart Surgery;  Laterality: N/A;  . TEE WITHOUT CARDIOVERSION N/A 03/31/2017   Procedure: TRANSESOPHAGEAL ECHOCARDIOGRAM (TEE);  Surgeon: [name], [name], MD;  Location: [place];  Service: Open Heart Surgery;  Laterality: N/A;    There were no vitals filed for this visit.  Subjective Assessment - 02/08/18 1149    Subjective  "Same"    Currently in Pain?  Yes    Pain Score  7     Pain Location  Shoulder    Pain Orientation  Left                       [facility] Adult PT Treatment/Exercise -  02/08/18 0001      Shoulder Exercises: Standing   Flexion  AROM;Both;20 reps    ABduction  20 reps;Both;AROM    Extension  Theraband;20 reps;Both    Theraband Level (Shoulder Extension)  Level 2 (Red)    Row  Theraband;20 reps;Both    Theraband Level (Shoulder Row)  Level 2 (Red)    Other Standing Exercises  AAROM flex, Ext, ER x10 with cane       Manual Therapy   Manual Therapy  Passive ROM;Joint mobilization;Myofascial release    Manual therapy comments  soft tissue massage to subscap    Joint Mobilization  Gr III post/ inf GH mobs    Myofascial Release  subscap release w/ PROM ER    Passive ROM  all planes with end ROM hold, increased pain @ end ROM throughout               PT Short Term Goals - 02/01/18 1140      PT SHORT TERM GOAL #1   Title  independent with initial HEP    Time  2    Period  Weeks    Status  New        PT Long Term Goals - 02/01/18 1140      PT LONG TERM GOAL #1   Title  understand posture and body mechanics for shoulder issues    Time  8    Period  Weeks    Status  New      PT LONG TERM GOAL #2   Title  decrease resting pain 50%    Time  8    Period  Weeks    Status  New      PT LONG TERM GOAL #3   Title  report no difficulty doing his hair or getting dressed    Time  8    Status  New      PT LONG TERM GOAL #4   Title  increase left shoulder Active flexion to 130 degrees    Time  8    Period  Weeks    Status  New      PT LONG TERM GOAL #5   Title  increase active left shoulder IR to 60 degrees    Time  8    Period  Weeks    Status  New            Plan - 02/08/18 1222    Clinical Impression Statement  Pt L shoulder is limited, L shoulder pain reported at end range during PROM. No issues with today's strengthening interventions. L shoulder elevation with active flex and abd.    Rehab Potential  Good    PT Frequency  2x / week    PT Duration  8 weeks    PT Treatment/Interventions  ADLs/Self  Care Home  Management;Cryotherapy;Moist Heat;Iontophoresis 4mg /ml Dexamethasone;Neuromuscular re-education;Therapeutic exercise;Therapeutic activities;Patient/family education;Manual techniques;Passive range of motion    PT Next Visit Plan  slowly add exercises, work on ROM of the left shoulder       Patient will benefit from skilled therapeutic intervention in order to improve the following deficits and impairments:  Decreased range of motion, Impaired UE functional use, Increased muscle spasms, Pain, Improper body mechanics, Decreased strength, Postural dysfunction  Visit Diagnosis: Acute pain of left shoulder  Stiffness of left shoulder, not elsewhere classified  Muscle weakness (generalized)     Problem List Patient Active Problem List   Diagnosis Date Noted  . Ischemic cardiomyopathy 12/15/2017  . Dyspnea 04/16/2017  . Acute combined systolic and diastolic heart failure ([place]) 04/16/2017  . Pneumothorax on right   . Cardiogenic shock ([place]) 03/28/2017  . 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 Acute respiratory failure with hypoxia ([place]) 03/28/2017  . AKI (acute kidney injury) ([place]) 03/28/2017  . LVAD (left ventricular assist device) present ([place])   . Coronary artery disease 03/25/2017  . Chest pain due to CAD 03/24/2017  . 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    Scot Jun, PTA 02/08/2018, 12:31 PM  [place] [address] [place], [place], [address] Phone: [phone]   Fax:  [phone]  Name: Luis [name] MRN: [id number] Date of Birth: 02/08/1946

## 2018-02-10 ENCOUNTER — Ambulatory Visit: Payer: Medicare Other | Admitting: Physical Therapy

## 2018-02-15 ENCOUNTER — Encounter: Payer: Self-pay | Admitting: Physical Therapy

## 2018-02-15 ENCOUNTER — Ambulatory Visit: Payer: Medicare Other | Admitting: Physical Therapy

## 2018-02-15 DIAGNOSIS — M25512 Pain in left shoulder: Secondary | ICD-10-CM

## 2018-02-15 DIAGNOSIS — M25612 Stiffness of left shoulder, not elsewhere classified: Secondary | ICD-10-CM

## 2018-02-15 DIAGNOSIS — M6281 Muscle weakness (generalized): Secondary | ICD-10-CM

## 2018-02-15 NOTE — Therapy (Signed)
[place], [place], [address] Phone: [phone]   Fax:  [phone]  Physical Therapy Treatment  Patient Details  Name: Luis [name] MRN: [id number] Date of Birth: 1945/12/31 Referring Provider: Marlou Sa   Encounter Date: 02/15/2018  PT End of Session - 02/15/18 1142    Visit Number  4    Date for PT Re-Evaluation  04/03/18    PT Start Time  1058    PT Stop Time  1142    PT Time Calculation (min)  44 min    Activity Tolerance  Patient tolerated treatment well;Patient limited by pain    Behavior During Therapy  [facility] for tasks assessed/performed       Past Medical History:  Diagnosis Date  . AICD (automatic cardioverter/defibrillator) present 12/15/2017  . Chronic kidney disease    kidney stones; noted per H&P per Dr [name] 02/06/2015 chronic kidney disease stage II  . Diabetes mellitus without complication ([place])    per H&P Dr [name] noted to be prediabetic 02/06/2015; pt denies DM on 12/15/2017  . Dyslipidemia   . History of hepatomegaly    with fatty liver discussed per H&P per Dr [name] 02/06/2015 secondary to abd ultrasound   . History of kidney stones   . Sleep apnea    AHI 17 on sleep study 08/23/2013 pt states was given CPAP machine but sent it back - does not use now 12/15/2017  . Vitamin D deficiency    as noted per H&P per Dr [name] 02/06/2015    Past Surgical History:  Procedure Laterality Date  . ASD REPAIR N/A 03/25/2017   Procedure: ATRIAL SEPTAL DEFECT (ASD) REPAIR;  Surgeon: [name], MD;  Location: [place];  Service: Open Heart Surgery;  Laterality: N/A;  . CARDIAC DEFIBRILLATOR PLACEMENT  12/15/2017  . CORONARY ARTERY BYPASS GRAFT N/A 03/25/2017   Procedure: CORONARY ARTERY BYPASS GRAFTING (CABG) x 2 using left internal mammary artery and right greater saphenous leg vein using endosciope.;  Surgeon: [name], MD;  Location: [place];  Service: Open Heart Surgery;   Laterality: N/A;  . CYSTOSCOPY W/ RETROGRADES Left 03/26/2015   Procedure: CYSTOSCOPY WITH RETROGRADE PYELOGRAM;  Surgeon: [name], MD;  [facility];  Service: Urology;  Laterality: Left;  . ICD IMPLANT N/A 12/15/2017   Procedure: ICD IMPLANT;  Surgeon: [name], MD;  Location: [place] [facility];  Service: Cardiovascular;  Laterality: N/A;  . LEFT HEART CATH AND CORONARY ANGIOGRAPHY N/A 03/25/2017   Procedure: Left Heart Cath and Coronary Angiography;  Surgeon: [name], MD;  Location: [place] [facility];  Service: Cardiovascular;  Laterality: N/A;  . PLACEMENT OF IMPELLA LEFT VENTRICULAR ASSIST DEVICE  03/25/2017   Procedure: PLACEMENT OF IMPELLA LEFT VENTRICULAR ASSIST DEVICE;  Surgeon: [name], MD;  Location: [place];  Service: Open Heart Surgery;;  . REMOVAL OF IMPELLA LEFT VENTRICULAR ASSIST DEVICE N/A 03/31/2017   Procedure: REMOVAL OF IMPELLA LEFT VENTRICULAR ASSIST DEVICE;  Surgeon: [name], MD;  Location: [place];  Service: Open Heart Surgery;  Laterality: N/A;  . ROBOT ASSISTED PYELOPLASTY Left 03/26/2015   Procedure: ROBOTIC ASSISTED PYELOPLASTY WITH STENT PLACEMENT, REMOVAL OF STONE AND CYST DECORTICATION;  Surgeon: [name], MD;  [facility];  Service: Urology;  Laterality: Left;  . TEE WITHOUT CARDIOVERSION N/A 03/25/2017   Procedure: TRANSESOPHAGEAL ECHOCARDIOGRAM (TEE);  Surgeon: [name], [name], MD;  Location: [place];  Service: Open Heart Surgery;  Laterality: N/A;  . TEE WITHOUT CARDIOVERSION N/A 03/31/2017   Procedure: TRANSESOPHAGEAL ECHOCARDIOGRAM (TEE);  Surgeon: [name], [name], MD;  Location: [place];  Service: Open Heart Surgery;  Laterality: N/A;    There were no vitals filed for this visit.  Subjective Assessment - 02/15/18 1100    Subjective  "Same, I do the exercises two three time a day"    Currently in Pain?  Yes    Pain Score  7     Pain Location  Shoulder    Pain Orientation  Right                        [facility] Adult PT Treatment/Exercise - 02/15/18 0001      Shoulder Exercises: Supine   Flexion  AAROM;Left;15 reps      Shoulder Exercises: Standing   Flexion  AAROM;Left;10 reps ladder     ABduction  Left;AAROM;10 reps ladder    Other Standing Exercises  AAROM flex, Ext, ER x10 with cane     Other Standing Exercises  Flex & abd up wall with pillow case LUE x10       Shoulder Exercises: ROM/Strengthening   UBE (Upper Arm Bike)  L2 26fwd/3rev      Manual Therapy   Manual Therapy  Passive ROM;Joint mobilization;Myofascial release    Manual therapy comments  soft tissue massage to subscap    Joint Mobilization  Gr III post/ inf GH mobs    Myofascial Release  subscap release w/ PROM ER    Passive ROM  all planes with end ROM hold, increased pain @ end ROM throughout               PT Short Term Goals - 02/15/18 1143      PT SHORT TERM GOAL #1   Title  independent with initial HEP    Status  Achieved        PT Long Term Goals - 02/15/18 1143      PT LONG TERM GOAL #2   Title  decrease resting pain 50%    Status  On-going            Plan - 02/15/18 1143    Clinical Impression Statement  Pt L shoulder remains limited with all motions. Today's exercises focuses on active and passive stretching, Pt does compensate with all active stretching motions. He continues to report pain at end range. Springy end feel with most motions except for External rotation. Tightness noted with ER.     Rehab Potential  Good    PT Frequency  2x / week    PT Duration  8 weeks    PT Treatment/Interventions  ADLs/Self Care Home Management;Cryotherapy;Moist Heat;Iontophoresis 4mg /ml Dexamethasone;Neuromuscular re-education;Therapeutic exercise;Therapeutic activities;Patient/family education;Manual techniques;Passive range of motion    PT Next Visit Plan  slowly add exercises, work on ROM of the left shoulder       Patient will benefit from skilled  therapeutic intervention in order to improve the following deficits and impairments:  Decreased range of motion, Impaired UE functional use, Increased muscle spasms, Pain, Improper body mechanics, Decreased strength, Postural dysfunction  Visit Diagnosis: Stiffness of left shoulder, not elsewhere classified  Acute pain of left shoulder  Muscle weakness (generalized)     Problem List Patient Active Problem List   Diagnosis Date Noted  . Ischemic cardiomyopathy 12/15/2017  . Dyspnea 04/16/2017  . Acute combined systolic and diastolic heart failure ([place]) 04/16/2017  . Pneumothorax on right   .  Cardiogenic shock ([place]) 03/28/2017  . 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 Acute respiratory failure with hypoxia ([place]) 03/28/2017  . AKI (acute kidney injury) ([place]) 03/28/2017  . LVAD (left ventricular assist device) present ([place])   . Coronary artery disease 03/25/2017  . Chest pain due to CAD 03/24/2017  . 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    Scot Jun, PTA 02/15/2018, 11:48 AM  [place] [address] [place], [place], [address] Phone: [phone]   Fax:  [phone]  Name: Luis [name] MRN: [id number] Date of Birth: [birth year]

## 2018-02-19 ENCOUNTER — Encounter: Payer: Self-pay | Admitting: Physical Therapy

## 2018-02-19 ENCOUNTER — Ambulatory Visit: Payer: Medicare Other | Admitting: Physical Therapy

## 2018-02-19 DIAGNOSIS — M6281 Muscle weakness (generalized): Secondary | ICD-10-CM

## 2018-02-19 DIAGNOSIS — M25612 Stiffness of left shoulder, not elsewhere classified: Secondary | ICD-10-CM

## 2018-02-19 DIAGNOSIS — M25512 Pain in left shoulder: Secondary | ICD-10-CM

## 2018-02-19 NOTE — Therapy (Addendum)
[place], [place], [address] Phone: [phone]   Fax:  [phone]  Physical Therapy Treatment  Patient Details  Name: Luis [name] MRN: [id number] Date of Birth: 12-09-1945 Referring Provider: Marlou Sa   Encounter Date: 02/19/2018  PT End of Session - 02/19/18 1130    Visit Number  5    Date for PT Re-Evaluation  04/03/18    PT Start Time  1058    PT Stop Time  1140    PT Time Calculation (min)  42 min    Activity Tolerance  Patient tolerated treatment well;Patient limited by pain    Behavior During Therapy  [facility] for tasks assessed/performed       Past Medical History:  Diagnosis Date  . AICD (automatic cardioverter/defibrillator) present 12/15/2017  . Chronic kidney disease    kidney stones; noted per H&P per Dr [name] 02/06/2015 chronic kidney disease stage II  . Diabetes mellitus without complication ([place])    per H&P Dr [name] noted to be prediabetic 02/06/2015; pt denies DM on 12/15/2017  . Dyslipidemia   . History of hepatomegaly    with fatty liver discussed per H&P per Dr [name] 02/06/2015 secondary to abd ultrasound   . History of kidney stones   . Sleep apnea    AHI 17 on sleep study 08/23/2013 pt states was given CPAP machine but sent it back - does not use now 12/15/2017  . Vitamin D deficiency    as noted per H&P per Dr [name] 02/06/2015    Past Surgical History:  Procedure Laterality Date  . ASD REPAIR N/A 03/25/2017   Procedure: ATRIAL SEPTAL DEFECT (ASD) REPAIR;  Surgeon: [name], MD;  Location: [place];  Service: Open Heart Surgery;  Laterality: N/A;  . CARDIAC DEFIBRILLATOR PLACEMENT  12/15/2017  . CORONARY ARTERY BYPASS GRAFT N/A 03/25/2017   Procedure: CORONARY ARTERY BYPASS GRAFTING (CABG) x 2 using left internal mammary artery and right greater saphenous leg vein using endosciope.;  Surgeon: [name], MD;  Location: [place];  Service: Open Heart Surgery;   Laterality: N/A;  . CYSTOSCOPY W/ RETROGRADES Left 03/26/2015   Procedure: CYSTOSCOPY WITH RETROGRADE PYELOGRAM;  Surgeon: [name], MD;  [facility];  Service: Urology;  Laterality: Left;  . ICD IMPLANT N/A 12/15/2017   Procedure: ICD IMPLANT;  Surgeon: [name], MD;  Location: [place] [facility];  Service: Cardiovascular;  Laterality: N/A;  . LEFT HEART CATH AND CORONARY ANGIOGRAPHY N/A 03/25/2017   Procedure: Left Heart Cath and Coronary Angiography;  Surgeon: [name], MD;  Location: [place] [facility];  Service: Cardiovascular;  Laterality: N/A;  . PLACEMENT OF IMPELLA LEFT VENTRICULAR ASSIST DEVICE  03/25/2017   Procedure: PLACEMENT OF IMPELLA LEFT VENTRICULAR ASSIST DEVICE;  Surgeon: [name], MD;  Location: [place];  Service: Open Heart Surgery;;  . REMOVAL OF IMPELLA LEFT VENTRICULAR ASSIST DEVICE N/A 03/31/2017   Procedure: REMOVAL OF IMPELLA LEFT VENTRICULAR ASSIST DEVICE;  Surgeon: [name], MD;  Location: [place];  Service: Open Heart Surgery;  Laterality: N/A;  . ROBOT ASSISTED PYELOPLASTY Left 03/26/2015   Procedure: ROBOTIC ASSISTED PYELOPLASTY WITH STENT PLACEMENT, REMOVAL OF STONE AND CYST DECORTICATION;  Surgeon: [name], MD;  [facility];  Service: Urology;  Laterality: Left;  . TEE WITHOUT CARDIOVERSION N/A 03/25/2017   Procedure: TRANSESOPHAGEAL ECHOCARDIOGRAM (TEE);  Surgeon: [name], [name], MD;  Location: [place];  Service: Open Heart Surgery;  Laterality: N/A;  . TEE WITHOUT CARDIOVERSION N/A 03/31/2017   Procedure: TRANSESOPHAGEAL ECHOCARDIOGRAM (TEE);  Surgeon: [name], [name], MD;  Location: [place];  Service: Open Heart Surgery;  Laterality: N/A;    There were no vitals filed for this visit.  Subjective Assessment - 02/19/18 1100    Subjective  Pt reports that he feels better then he goes to sleep and it is tight when he waked up.    Currently in Pain?  Yes    Pain Score  6     Pain Location  Shoulder    Pain Orientation   Left                       [facility] Adult PT Treatment/Exercise - 02/19/18 0001      Shoulder Exercises: Seated   Other Seated Exercises  scapular retraction 15x5"    Other Seated Exercises  bicep curl 2x10 4lb      Shoulder Exercises: Standing   Flexion  AAROM;Left;10 reps ladder    ABduction  Left;AAROM;10 reps ladder    Other Standing Exercises  AAROM flex, Ext, ER x10 with cane     Other Standing Exercises  Flex & abd up wall with pillow case LUE x10       Shoulder Exercises: ROM/Strengthening   UBE (Upper Arm Bike)  L2 34fd/4rev      Manual Therapy   Manual Therapy  Passive ROM;Joint mobilization;Myofascial release    Manual therapy comments  soft tissue massage to subscap    Joint Mobilization  Gr III post/ inf GH mobs    Myofascial Release  subscap release w/ PROM ER    Passive ROM  all planes with end ROM hold, increased pain @ end ROM throughout               PT Short Term Goals - 02/19/18 1130      PT SHORT TERM GOAL #1   Title  independent with initial HEP    Status  Achieved        PT Long Term Goals - 02/19/18 1130      PT LONG TERM GOAL #1   Title  understand posture and body mechanics for shoulder issues    Status  Partially Met      PT LONG TERM GOAL #2   Title  decrease resting pain 50%    Status  On-going      PT LONG TERM GOAL #4   Title  increase left shoulder Active flexion to 130 degrees    Status  On-going      PT LONG TERM GOAL #5   Title  increase active left shoulder IR to 60 degrees    Status  On-going            Plan - 02/19/18 1137    Clinical Impression Statement  L shoulder ROM is still limited again focuses more on passive and active stretching. Pt does well with intervention below 90 deg flexion. Pain at end range with PROM. Positive response to subscapular release gaining some passive motion. Internal and external rotation is very limited.    Rehab Potential  Good    PT Frequency  2x / week    PT  Duration  8 weeks    PT Treatment/Interventions  ADLs/Self Care Home Management;Cryotherapy;Moist Heat;Iontophoresis 49mml Dexamethasone;Neuromuscular re-education;Therapeutic exercise;Therapeutic activities;Patient/family education;Manual techniques;Passive range of motion    PT Next Visit Plan  slowly add exercises, work on ROM of  the left shoudler       Patient will benefit from skilled therapeutic intervention in order to improve the following deficits and impairments:  Decreased range of motion, Impaired UE functional use, Increased muscle spasms, Pain, Improper body mechanics, Decreased strength, Postural dysfunction  Visit Diagnosis: Acute pain of left shoulder  Muscle weakness (generalized)  Stiffness of left shoulder, not elsewhere classified     Problem List Patient Active Problem List   Diagnosis Date Noted  . Ischemic cardiomyopathy 12/15/2017  . Dyspnea 04/16/2017  . Acute combined systolic and diastolic heart failure ([place]) 04/16/2017  . Pneumothorax on right   . Cardiogenic shock ([place]) 03/28/2017  . 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 Acute respiratory failure with hypoxia ([place]) 03/28/2017  . AKI (acute kidney injury) ([place]) 03/28/2017  . LVAD (left ventricular assist device) present ([place])   . Coronary artery disease 03/25/2017  . Chest pain due to CAD 03/24/2017  . 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   PHYSICAL THERAPY DISCHARGE SUMMARY  Visits from Start of Care: 5 Plan: Patient agrees to discharge.  Patient goals were not met. Patient is being discharged due to not returning since the last visit.  ?????     Scot Jun, PTA 02/19/2018, 11:39 AM  [address] [place], [place], [address] Phone: [phone]   Fax:  [phone]  Name: Luis [name] MRN: [id number] Date of Birth: 04-07-1946

## 2018-03-01 ENCOUNTER — Ambulatory Visit: Payer: Medicare Other | Admitting: Physical Therapy

## 2018-03-02 ENCOUNTER — Telehealth: Payer: Self-pay | Admitting: Cardiology

## 2018-03-02 NOTE — Telephone Encounter (Signed)
Called pt b/c we had a request to release pt information in carelink to [facility]. Pt verbalized that this is what he wanted. Transfer request completed.

## 2018-03-05 ENCOUNTER — Encounter: Payer: Medicare Other | Admitting: Physical Therapy

## 2018-03-23 ENCOUNTER — Encounter: Payer: Medicare Other | Admitting: Cardiology

## 2018-04-08 ENCOUNTER — Telehealth: Payer: Self-pay | Admitting: Cardiology

## 2018-04-08 NOTE — Telephone Encounter (Signed)
Informed pharmacy that I was waiting to discuss w/ pt's primary cardiologist if he decreased this medication. Our records show pt is taking full 6.25 mb tab BID. Pharmacy aware I will follow up tomorrow once I speak with [place].

## 2018-04-08 NOTE — Telephone Encounter (Signed)
Pt c/o medication issue:  1. Name of Medication: Carvedilol   2. How are you currently taking this medication (dosage and times per day)? 6.25 mg 1 tab BID  3.Are you having a reaction (difficulty breathing--STAT)? No  4. What is your medication issue? Pharmacy requesting 3.125 mg since pt is cutting the 6.25 in half-90 day supply-  [facility]

## 2018-04-09 MED ORDER — CARVEDILOL 3.125 MG PO TABS: 3.1250 mg | ORAL_TABLET | Freq: Two times a day (BID) | ORAL | 3 refills | Status: DC

## 2018-04-09 NOTE — Telephone Encounter (Signed)
Spoke to Dr. [name] who confirms Carvedilol decreased to 3.125 mg BID. Will send in Rx reflecting new dosage.

## 2018-06-11 ENCOUNTER — Telehealth (HOSPITAL_COMMUNITY): Payer: Self-pay

## 2018-06-11 NOTE — Telephone Encounter (Signed)
Patient returned call, states he is interested in participating in the Cardiac Rehab Program. Patient will come in for orientation on 08/03/18 @ 8:15AM and will attend the 11:15AM exercise class.  Mailed homework package.

## 2018-06-11 NOTE — Telephone Encounter (Signed)
Pt insurance is active and benefits verified through [facility]. Co-pay $0.00, DED $0.00/$0.00 met, out of pocket $5,800.00/$928.79 met, co-insurance 20%. No pre-authorization. Jana M./[employer], 06/11/18 @ 10:55AM, NJN#[id number]

## 2018-06-11 NOTE — Telephone Encounter (Signed)
Attempted to call patient in regards to Cardiac Rehab - LM on VM 

## 2018-06-29 ENCOUNTER — Ambulatory Visit (HOSPITAL_COMMUNITY)
Admission: RE | Admit: 2018-06-29 | Discharge: 2018-06-29 | Disposition: A | Discharge status: Home / Self Care | Payer: Medicare Other | Source: Ambulatory Visit | Attending: Internal Medicine | Admitting: Internal Medicine

## 2018-06-29 VITALS — BP 106/64 | HR 71 | Wt 182.5 lb

## 2018-06-29 DIAGNOSIS — I5022 Chronic systolic (congestive) heart failure: Secondary | ICD-10-CM | POA: Insufficient documentation

## 2018-06-29 DIAGNOSIS — N182 Chronic kidney disease, stage 2 (mild): Secondary | ICD-10-CM | POA: Insufficient documentation

## 2018-06-29 DIAGNOSIS — Z951 Presence of aortocoronary bypass graft: Secondary | ICD-10-CM | POA: Insufficient documentation

## 2018-06-29 DIAGNOSIS — Z7982 Long term (current) use of aspirin: Secondary | ICD-10-CM | POA: Insufficient documentation

## 2018-06-29 DIAGNOSIS — G4733 Obstructive sleep apnea (adult) (pediatric): Secondary | ICD-10-CM | POA: Insufficient documentation

## 2018-06-29 DIAGNOSIS — I2510 Atherosclerotic heart disease of native coronary artery without angina pectoris: Secondary | ICD-10-CM | POA: Diagnosis not present

## 2018-06-29 DIAGNOSIS — R0600 Dyspnea, unspecified: Secondary | ICD-10-CM | POA: Diagnosis not present

## 2018-06-29 DIAGNOSIS — E1122 Type 2 diabetes mellitus with diabetic chronic kidney disease: Secondary | ICD-10-CM | POA: Diagnosis not present

## 2018-06-29 DIAGNOSIS — Z79899 Other long term (current) drug therapy: Secondary | ICD-10-CM | POA: Diagnosis not present

## 2018-06-29 NOTE — Progress Notes (Signed)
ADVANCED HF CLINIC NOTE  Primary Cardiologist: [name]   HPI:  72 y/o male with h/o DM2, HL, OSA, systolic HF and CAD s/p CABG 6/19 (LIMA to LAD and SVG to OM) in setting of acute LM dissection.  Underwent attempted PCI of totalled LAD in 3/78 complicated by LM dissection. Taken emergently to OR on 03/25/17 for CABG and Impella placement. Post-op course was slow and complicated by PAF and recurrent PTX requiring CT placement.  D/c'd home on 04/10/17. Readmitted on 04/16/17 with HF in setting of drinking lots of fluid. . Echo EF 35-40%. MDT ICD Placed 3/19   Has been followed by Dr. [name]. Referred by Dr. Erick Alley at [facility] for assessment for possible need for advanced therapies  When he wakes up he feels fine. Walks 3 miles every day.in 1 hour. No CP or SOB but does feel fatigued. Sometimes after walking BP will drop into 80s. Wife will make lemonade with salt and BP comes back up. Currently on Entresto 24/26 bid and carvedilol 3.125 bid. Other meds stopped. He has been signed up for CR again. Had recent echo but doesn't know result. Never takes fluid with him on walks. Not on statin but now getting PCSK-9 injections. Had at-home sleeps study and was normal.    Past Medical History:  Diagnosis Date  . AICD (automatic cardioverter/defibrillator) present 12/15/2017  . Chronic kidney disease    kidney stones; noted per H&P per Dr [name] 02/06/2015 chronic kidney disease stage II  . Diabetes mellitus without complication ([place])    per H&P Dr [name] noted to be prediabetic 02/06/2015; pt denies DM on 12/15/2017  . Dyslipidemia   . History of hepatomegaly    with fatty liver discussed per H&P per Dr [name] 02/06/2015 secondary to abd ultrasound   . History of kidney stones   . Sleep apnea    AHI 17 on sleep study 08/23/2013 pt states was given CPAP machine but sent it back - does not use now 12/15/2017  . Vitamin D deficiency    as noted per H&P per Dr [name] 02/06/2015    SH:  Social  History   Socioeconomic History  . Marital status: Married    Spouse name: Not on file  . Number of children: Not on file  . Years of education: Not on file  . Highest education level: Not on file  Occupational History  . Not on file  Social Needs  . Financial resource strain: Not on file  . Food insecurity:    Worry: Not on file    Inability: Not on file  . Transportation needs:    Medical: Not on file    Non-medical: Not on file  Tobacco Use  . Smoking status: Never Smoker  . Smokeless tobacco: Never Used  Substance and Sexual Activity  . Alcohol use: No  . Drug use: No  . Sexual activity: Not on file  Lifestyle  . Physical activity:    Days per week: Not on file    Minutes per session: Not on file  . Stress: Not on file  Relationships  . Social connections:    Talks on phone: Not on file    Gets together: Not on file    Attends religious service: Not on file    Active member of club or organization: Not on file    Attends meetings of clubs or organizations: Not on file    Relationship status: Not on file  . Intimate partner violence:  Fear of current or ex partner: Not on file    Emotionally abused: Not on file    Physically abused: Not on file    Forced sexual activity: Not on file  Other Topics Concern  . Not on file  Social History Narrative  . Not on file     Current Outpatient Medications  Medication Sig Dispense Refill  . aspirin 81 MG chewable tablet Chew 81 mg by mouth daily.    . carvedilol (COREG) 3.125 MG tablet Take 1 tablet (3.125 mg total) by mouth 2 (two) times daily. 180 tablet 3  . cholecalciferol (VITAMIN D) 1000 units tablet Take 1,000 Units by mouth daily.    . Coenzyme Q10 (CO Q 10) 100 MG CAPS Take 100 mg by mouth daily.     . Multiple Vitamin (MULTIVITAMIN) capsule Take 1 capsule by mouth daily.    . psyllium (METAMUCIL) 58.6 % powder Take 1 packet by mouth 3 (three) times daily.    . sacubitril-valsartan (ENTRESTO) 24-26 MG Take 1  tablet by mouth 2 (two) times daily.    . vitamin B-12 (CYANOCOBALAMIN) 100 MCG tablet Take 100 mcg by mouth daily.     No current facility-administered medications for this encounter.     Vitals:   06/29/18 1039  BP: 106/64  Pulse: 71  SpO2: 98%  Weight: 82.8 kg (182 lb 8 oz)    PHYSICAL EXAM: General:  Well appearing. No resp difficulty HEENT: normal Neck: supple. no JVD. Carotids 2+ bilat; no bruits. No lymphadenopathy or thryomegaly appreciated. Cor: PMI nondisplaced. Regular rate & rhythm. No rubs, gallops or murmurs. Lungs: clear Abdomen: soft, nontender, nondistended. No hepatosplenomegaly. No bruits or masses. Good bowel sounds. Extremities: no cyanosis, clubbing, rash, edema Neuro: alert & orientedx3, cranial nerves grossly intact. moves all 4 extremities w/o difficulty. Affect pleasant  ECG: NSR 71 septal Qs. Personally reviewed   ASSESSMENT & PLAN:  1. Chronic systolic HF - Due to iCM. EF 35-40% (7/18) More recently was told EF 28% - s/p CABG 6/18 - Currently NYHA II-early III (due to post-exercise fatigue at worst) - On Entresto 24/26 bid and carvedilol 3.125 bid. Titration limited by low BP - Suspect major issue is volume depletion post exercise. Encouraged him to drink 16 oz of fluid during hour-long workouts. - Will plan CPX test to formally assess functional capacity and risk-stratify   2. CAD s/p emergent CABG due to LM dissection in setting of attempted PCI of tLAD - doing well. No s/s ischemia - on PCSK-9. Would consider restarting statin if he can tolerate as well  - has referral back to CR.   3. Post-op AF - maintaining NSR. Off amio   F/u with Dr. [name].  Glori Bickers, MD  10:58 AM

## 2018-06-29 NOTE — Patient Instructions (Signed)
Your provider requests you have a Cardiopulmonary Exercise test (CPX)  Follow up in 3 months with Dr.Bensimhon **please call our office at [phone] in December to schedule your January appointment**

## 2018-07-05 ENCOUNTER — Other Ambulatory Visit (HOSPITAL_COMMUNITY): Payer: Self-pay | Admitting: *Deleted

## 2018-07-05 ENCOUNTER — Ambulatory Visit (HOSPITAL_COMMUNITY): Payer: Medicare Other | Attending: Internal Medicine

## 2018-07-05 DIAGNOSIS — R0600 Dyspnea, unspecified: Secondary | ICD-10-CM

## 2018-07-05 DIAGNOSIS — R0602 Shortness of breath: Secondary | ICD-10-CM | POA: Diagnosis not present

## 2018-07-06 ENCOUNTER — Telehealth (HOSPITAL_COMMUNITY): Payer: Self-pay

## 2018-08-02 ENCOUNTER — Telehealth (HOSPITAL_COMMUNITY): Payer: Self-pay

## 2018-08-02 NOTE — Telephone Encounter (Signed)
Pt called and wanted to cancel his appts, stated he will be in and out of town and will not have any time to join CR until March. Also stated he will contact us when he is ready to schedule.  Closed referral

## 2018-08-03 ENCOUNTER — Inpatient Hospital Stay (HOSPITAL_COMMUNITY): Admission: RE | Admit: 2018-08-03 | Payer: Medicare Other | Source: Ambulatory Visit

## 2018-08-09 ENCOUNTER — Ambulatory Visit (HOSPITAL_COMMUNITY): Payer: Medicare Other

## 2018-08-11 ENCOUNTER — Ambulatory Visit (HOSPITAL_COMMUNITY): Payer: Medicare Other

## 2018-08-13 ENCOUNTER — Ambulatory Visit (HOSPITAL_COMMUNITY): Payer: Medicare Other

## 2018-08-16 ENCOUNTER — Ambulatory Visit (HOSPITAL_COMMUNITY): Payer: Medicare Other

## 2018-08-18 ENCOUNTER — Ambulatory Visit (HOSPITAL_COMMUNITY): Payer: Medicare Other

## 2018-08-20 ENCOUNTER — Ambulatory Visit (HOSPITAL_COMMUNITY): Payer: Medicare Other

## 2018-08-23 ENCOUNTER — Ambulatory Visit (HOSPITAL_COMMUNITY): Payer: Medicare Other

## 2018-08-25 ENCOUNTER — Ambulatory Visit (HOSPITAL_COMMUNITY): Payer: Medicare Other

## 2018-08-27 ENCOUNTER — Ambulatory Visit (HOSPITAL_COMMUNITY): Payer: Medicare Other

## 2018-08-30 ENCOUNTER — Ambulatory Visit (HOSPITAL_COMMUNITY): Payer: Medicare Other

## 2018-09-01 ENCOUNTER — Ambulatory Visit (HOSPITAL_COMMUNITY): Payer: Medicare Other

## 2018-09-03 ENCOUNTER — Ambulatory Visit (HOSPITAL_COMMUNITY): Payer: Medicare Other

## 2018-09-06 ENCOUNTER — Ambulatory Visit (HOSPITAL_COMMUNITY): Payer: Medicare Other

## 2018-09-08 ENCOUNTER — Ambulatory Visit (HOSPITAL_COMMUNITY): Payer: Medicare Other

## 2018-09-10 ENCOUNTER — Ambulatory Visit (HOSPITAL_COMMUNITY): Payer: Medicare Other

## 2018-09-13 ENCOUNTER — Ambulatory Visit (HOSPITAL_COMMUNITY): Payer: Medicare Other

## 2018-09-15 ENCOUNTER — Ambulatory Visit (HOSPITAL_COMMUNITY): Payer: Medicare Other

## 2018-09-17 ENCOUNTER — Ambulatory Visit (HOSPITAL_COMMUNITY): Payer: Medicare Other

## 2018-09-20 ENCOUNTER — Ambulatory Visit (HOSPITAL_COMMUNITY): Payer: Medicare Other

## 2018-09-24 ENCOUNTER — Ambulatory Visit (HOSPITAL_COMMUNITY): Payer: Medicare Other

## 2018-09-27 ENCOUNTER — Ambulatory Visit (HOSPITAL_COMMUNITY): Payer: Medicare Other

## 2018-10-01 ENCOUNTER — Ambulatory Visit (HOSPITAL_COMMUNITY): Payer: Medicare Other

## 2018-10-04 ENCOUNTER — Ambulatory Visit (HOSPITAL_COMMUNITY): Payer: Medicare Other

## 2018-10-06 ENCOUNTER — Ambulatory Visit (HOSPITAL_COMMUNITY): Payer: Medicare Other

## 2018-10-08 ENCOUNTER — Ambulatory Visit (HOSPITAL_COMMUNITY): Payer: Medicare Other

## 2018-10-11 ENCOUNTER — Ambulatory Visit (HOSPITAL_COMMUNITY): Payer: Medicare Other

## 2018-10-13 ENCOUNTER — Ambulatory Visit (HOSPITAL_COMMUNITY): Payer: Medicare Other

## 2018-10-15 ENCOUNTER — Ambulatory Visit (HOSPITAL_COMMUNITY): Payer: Medicare Other

## 2018-10-18 ENCOUNTER — Ambulatory Visit (HOSPITAL_COMMUNITY): Payer: Medicare Other

## 2018-10-19 ENCOUNTER — Telehealth (HOSPITAL_COMMUNITY): Payer: Self-pay | Admitting: Vascular Surgery

## 2018-10-19 NOTE — Telephone Encounter (Signed)
LEFT PT MESSAGE TO MAKE F/U APPT W/ DB IN FEB OR MARCH

## 2018-10-20 ENCOUNTER — Ambulatory Visit (HOSPITAL_COMMUNITY): Payer: Medicare Other

## 2018-10-22 ENCOUNTER — Ambulatory Visit (HOSPITAL_COMMUNITY): Payer: Medicare Other

## 2018-10-25 ENCOUNTER — Ambulatory Visit (HOSPITAL_COMMUNITY): Payer: Medicare Other

## 2018-10-27 ENCOUNTER — Ambulatory Visit (HOSPITAL_COMMUNITY): Payer: Medicare Other

## 2018-10-29 ENCOUNTER — Ambulatory Visit (HOSPITAL_COMMUNITY): Payer: Medicare Other

## 2018-11-01 ENCOUNTER — Ambulatory Visit (HOSPITAL_COMMUNITY): Payer: Medicare Other

## 2018-11-03 ENCOUNTER — Ambulatory Visit (HOSPITAL_COMMUNITY): Payer: Medicare Other

## 2018-11-05 ENCOUNTER — Ambulatory Visit (HOSPITAL_COMMUNITY): Payer: Medicare Other

## 2018-11-07 DIAGNOSIS — Z4502 Encounter for adjustment and management of automatic implantable cardiac defibrillator: Secondary | ICD-10-CM

## 2018-11-07 DIAGNOSIS — Z95810 Presence of automatic (implantable) cardiac defibrillator: Secondary | ICD-10-CM

## 2018-11-07 DIAGNOSIS — I509 Heart failure, unspecified: Secondary | ICD-10-CM

## 2018-11-08 ENCOUNTER — Ambulatory Visit (HOSPITAL_COMMUNITY): Payer: Medicare Other

## 2018-11-10 ENCOUNTER — Ambulatory Visit (HOSPITAL_COMMUNITY): Payer: Medicare Other

## 2018-12-21 DIAGNOSIS — Z4502 Encounter for adjustment and management of automatic implantable cardiac defibrillator: Secondary | ICD-10-CM

## 2018-12-21 DIAGNOSIS — I509 Heart failure, unspecified: Secondary | ICD-10-CM

## 2018-12-21 DIAGNOSIS — Z95810 Presence of automatic (implantable) cardiac defibrillator: Secondary | ICD-10-CM | POA: Diagnosis not present

## 2019-01-02 ENCOUNTER — Telehealth: Payer: Self-pay | Admitting: Cardiology

## 2019-01-07 ENCOUNTER — Ambulatory Visit (HOSPITAL_COMMUNITY)
Admission: RE | Admit: 2019-01-07 | Discharge: 2019-01-07 | Disposition: A | Discharge status: Home / Self Care | Payer: Medicare Other | Source: Ambulatory Visit | Attending: Internal Medicine | Admitting: Internal Medicine

## 2019-01-07 ENCOUNTER — Other Ambulatory Visit: Payer: Self-pay

## 2019-01-07 DIAGNOSIS — I2510 Atherosclerotic heart disease of native coronary artery without angina pectoris: Secondary | ICD-10-CM | POA: Diagnosis not present

## 2019-01-07 DIAGNOSIS — I5022 Chronic systolic (congestive) heart failure: Secondary | ICD-10-CM

## 2019-01-07 NOTE — Addendum Note (Signed)
Encounter addended by: Scarlette Calico, RN on: 01/07/2019 11:46 AM  Actions taken: Clinical Note Signed

## 2019-01-07 NOTE — Patient Instructions (Signed)
Please call our office in September to schedule an appointment with Dr Haroldine Laws for October  Please call our office at [phone] if you have any questions before your next appointment

## 2019-01-07 NOTE — Progress Notes (Signed)
Heart Failure TeleHealth Note  Due to national recommendations of social distancing due to [place] 19, Audio/video telehealth visit is felt to be most appropriate for this patient at this time.  See MyChart message from today for patient consent regarding telehealth for [facility].  Date:  01/07/2019   ID:  Luis [name], DOB 1945-12-26, MRN [id number]  Location: Home  Provider location: [place] [facility] Type of Visit: Established patient  PCP:  [name], MD  Cardiologist:  No primary care provider on file. Primary HF: Bensimhon  Chief Complaint: Heart Failure follow-up   History of Present Illness:  Luis [name] is a 73 y/o male with h/o DM2, HL, OSA, systolic HF and CAD s/p CABG 6/19 (LIMA to LAD and SVG to OM) in setting of acute LM dissection.  Underwent attempted PCI of totalled LAD in 1/54 complicated by LM dissection. Taken emergently to OR on 03/25/17 for CABG and Impella placement. Post-op course was slow and complicated by PAF and recurrent PTX requiring CT placement. D/c'd home on 04/10/17. Readmitted on 04/16/17 with HF in setting of drinking lots of fluid. Echo 2/19 EF 35-40%. MDT ICD Placed 3/19   Has been followed by Dr. [name]. Referred by Dr. Erick Alley at [facility] for assessment for possible need for advanced therapies.  CPX 10/19 BP rest: 100/56 BP peak: 122/54 Peak VO2: 17.1 (63% predicted peak VO2) - 19.2 when corrected for iBW VE/VCO2 slope: 40 OUES: 1.50 Peak RER: 1.05 VE/MVV: 81%  He presents via Engineer, civil (consulting) for a telehealth visit today.     Walks 3 miles every day and some times walks 6 miles a day.  No CP or SOB. BP is ok. Rarely will go down into low 90s then bounces back up when drinks lemon juice with salt. Weight stable. Not taking lasix. Unable to tolerate stating. Now getting Repatha shots with Dr. [name]. REcently had monitor and told his rhythm was normal.     Luis [name] denies symptoms worrisome for  COVID 19.   Past Medical History:  Diagnosis Date  . AICD (automatic cardioverter/defibrillator) present 12/15/2017  . Chronic kidney disease    kidney stones; noted per H&P per Dr [name] 02/06/2015 chronic kidney disease stage II  . Diabetes mellitus without complication ([place])    per H&P Dr [name] noted to be prediabetic 02/06/2015; pt denies DM on 12/15/2017  . Dyslipidemia   . History of hepatomegaly    with fatty liver discussed per H&P per Dr [name] 02/06/2015 secondary to abd ultrasound   . History of kidney stones   . Sleep apnea    AHI 17 on sleep study 08/23/2013 pt states was given CPAP machine but sent it back - does not use now 12/15/2017  . Vitamin D deficiency    as noted per H&P per Dr [name] 02/06/2015   Past Surgical History:  Procedure Laterality Date  . ASD REPAIR N/A 03/25/2017   Procedure: ATRIAL SEPTAL DEFECT (ASD) REPAIR;  Surgeon: [name], MD;  Location: [place];  Service: Open Heart Surgery;  Laterality: N/A;  . CARDIAC DEFIBRILLATOR PLACEMENT  12/15/2017  . CORONARY ARTERY BYPASS GRAFT N/A 03/25/2017   Procedure: CORONARY ARTERY BYPASS GRAFTING (CABG) x 2 using left internal mammary artery and right greater saphenous leg vein using endosciope.;  Surgeon: [name], MD;  Location: [place];  Service: Open Heart Surgery;  Laterality: N/A;  . CYSTOSCOPY W/ RETROGRADES Left 03/26/2015   Procedure: CYSTOSCOPY WITH RETROGRADE  PYELOGRAM;  Surgeon: [name], MD;  [facility];  Service: Urology;  Laterality: Left;  . ICD IMPLANT N/A 12/15/2017   Procedure: ICD IMPLANT;  Surgeon: [name], MD;  Location: [place] [facility];  Service: Cardiovascular;  Laterality: N/A;  . LEFT HEART CATH AND CORONARY ANGIOGRAPHY N/A 03/25/2017   Procedure: Left Heart Cath and Coronary Angiography;  Surgeon: [name], MD;  Location: [place] [facility];  Service: Cardiovascular;  Laterality: N/A;  . PLACEMENT OF IMPELLA LEFT VENTRICULAR ASSIST DEVICE  03/25/2017    Procedure: PLACEMENT OF IMPELLA LEFT VENTRICULAR ASSIST DEVICE;  Surgeon: [name], MD;  Location: [place];  Service: Open Heart Surgery;;  . REMOVAL OF IMPELLA LEFT VENTRICULAR ASSIST DEVICE N/A 03/31/2017   Procedure: REMOVAL OF IMPELLA LEFT VENTRICULAR ASSIST DEVICE;  Surgeon: [name], MD;  Location: [place];  Service: Open Heart Surgery;  Laterality: N/A;  . ROBOT ASSISTED PYELOPLASTY Left 03/26/2015   Procedure: ROBOTIC ASSISTED PYELOPLASTY WITH STENT PLACEMENT, REMOVAL OF STONE AND CYST DECORTICATION;  Surgeon: [name], MD;  [facility];  Service: Urology;  Laterality: Left;  . TEE WITHOUT CARDIOVERSION N/A 03/25/2017   Procedure: TRANSESOPHAGEAL ECHOCARDIOGRAM (TEE);  Surgeon: [name], [name], MD;  Location: [place];  Service: Open Heart Surgery;  Laterality: N/A;  . TEE WITHOUT CARDIOVERSION N/A 03/31/2017   Procedure: TRANSESOPHAGEAL ECHOCARDIOGRAM (TEE);  Surgeon: [name], [name], MD;  Location: [place];  Service: Open Heart Surgery;  Laterality: N/A;     Current Outpatient Medications  Medication Sig Dispense Refill  . aspirin 81 MG chewable tablet Chew 81 mg by mouth daily.    . carvedilol (COREG) 3.125 MG tablet Take 1 tablet (3.125 mg total) by mouth 2 (two) times daily. 180 tablet 3  . cholecalciferol (VITAMIN D) 1000 units tablet Take 1,000 Units by mouth daily.    . Coenzyme Q10 (CO Q 10) 100 MG CAPS Take 100 mg by mouth daily.     . Multiple Vitamin (MULTIVITAMIN) capsule Take 1 capsule by mouth daily.    . psyllium (METAMUCIL) 58.6 % powder Take 1 packet by mouth 3 (three) times daily.    . sacubitril-valsartan (ENTRESTO) 24-26 MG Take 1 tablet by mouth 2 (two) times daily.    . vitamin B-12 (CYANOCOBALAMIN) 100 MCG tablet Take 100 mcg by mouth daily.     No current facility-administered medications for this encounter.     Allergies:   Crestor [rosuvastatin calcium]   Social History:  The patient  reports that he has never smoked. He has never used  smokeless tobacco. He reports that he does not drink alcohol or use drugs.   Family History:  The patient's family history includes Healthy in his brother, father, mother, and sister.   ROS:  Please see the history of present illness.   All other systems are personally reviewed and negative.   Exam:  (Video/Tele Health Call; Exam is subjective and or/visual.) General:  Speaks in full sentences. No resp difficulty. Lungs: Normal respiratory effort with conversation.  Abdomen: Non-distended per patient report Extremities: Pt denies edema. Neuro: Alert & oriented x 3.   Recent Labs: No results found for requested labs within last 8760 hours.  Personally reviewed   Wt Readings from Last 3 Encounters:  06/29/18 82.8 kg (182 lb 8 oz)  12/16/17 76 kg (167 lb 9.6 oz)  11/18/17 78 kg (172 lb)      ASSESSMENT AND PLAN:  1. Chronic systolic HF - Due  to iCM. EF 35-40% (7/18) More recently was told EF 28% - s/p CABG 6/18 - Doing well. Currently NYHA II. Volume status seems low if anything.  - CPX  10/19 with moderate HF limitation but Slope elevated at 40. We need to watch closely. If functional status declines will have low threshold to repeat - On Entresto 24/26 bid and carvedilol 3.125 bid. Titration limited by low BP   2. CAD s/p emergent CABG due to LM dissection in setting of attempted PCI of tLAD - doing well. No s/s ischemia - Continue Repatha per Dr. [name]. Would consider restarting statin if he can tolerate as well   3. Post-op AF - maintaining NSR. Off amio  - recent monitor was normal  COVID screen The patient does not have any symptoms that suggest any further testing/ screening at this time.  Social distancing reinforced today.  Recommended follow-up:  6 months  Relevant cardiac medications were reviewed at length with the patient today.   The patient does not have concerns regarding their medications at this time.   The following changes were made today:  As  above  Today, I have spent 13 minutes with the patient with telehealth technology discussing the above issues .    Signed, Glori Bickers, MD  01/07/2019 9:10 AM  Advanced Heart Failure [place] [address] [phone] (office) [phone] (fax)

## 2019-01-07 NOTE — Progress Notes (Signed)
AVS mailed to pt.

## 2019-01-10 ENCOUNTER — Encounter (HOSPITAL_COMMUNITY): Payer: Medicare Other | Admitting: Internal Medicine

## 2019-01-14 ENCOUNTER — Ambulatory Visit: Payer: Self-pay | Admitting: Cardiology

## 2019-01-21 DIAGNOSIS — Z4502 Encounter for adjustment and management of automatic implantable cardiac defibrillator: Secondary | ICD-10-CM

## 2019-01-21 DIAGNOSIS — Z95810 Presence of automatic (implantable) cardiac defibrillator: Secondary | ICD-10-CM

## 2019-01-21 DIAGNOSIS — I509 Heart failure, unspecified: Secondary | ICD-10-CM | POA: Diagnosis not present

## 2019-02-04 ENCOUNTER — Other Ambulatory Visit: Payer: Self-pay | Admitting: Gastroenterology

## 2019-02-08 ENCOUNTER — Other Ambulatory Visit: Payer: Self-pay | Admitting: Gastroenterology

## 2019-02-15 ENCOUNTER — Other Ambulatory Visit: Payer: Self-pay | Admitting: Cardiology

## 2019-02-21 DIAGNOSIS — Z950 Presence of cardiac pacemaker: Secondary | ICD-10-CM

## 2019-02-21 DIAGNOSIS — I509 Heart failure, unspecified: Secondary | ICD-10-CM | POA: Diagnosis not present

## 2019-02-21 DIAGNOSIS — Z45018 Encounter for adjustment and management of other part of cardiac pacemaker: Secondary | ICD-10-CM | POA: Diagnosis not present

## 2019-02-25 ENCOUNTER — Other Ambulatory Visit: Payer: Self-pay | Admitting: Gastroenterology

## 2019-03-07 ENCOUNTER — Other Ambulatory Visit (HOSPITAL_COMMUNITY)
Admission: RE | Admit: 2019-03-07 | Discharge: 2019-03-07 | Disposition: A | Discharge status: Home / Self Care | Payer: Medicare Other | Source: Ambulatory Visit | Attending: Gastroenterology | Admitting: Gastroenterology

## 2019-03-07 ENCOUNTER — Other Ambulatory Visit: Payer: Self-pay | Admitting: Gastroenterology

## 2019-03-07 DIAGNOSIS — Z1159 Encounter for screening for other viral diseases: Secondary | ICD-10-CM | POA: Insufficient documentation

## 2019-03-08 ENCOUNTER — Encounter (HOSPITAL_COMMUNITY): Payer: Self-pay | Admitting: *Deleted

## 2019-03-08 ENCOUNTER — Other Ambulatory Visit: Payer: Self-pay

## 2019-03-08 NOTE — Progress Notes (Signed)
SPOKE W/  _patient via phone     SCREENING SYMPTOMS OF COVID 19:   COUGH--No  RUNNY NOSE--- No  SORE THROAT---No  NASAL CONGESTION----No  SNEEZING----No  SHORTNESS OF BREATH---No  DIFFICULTY BREATHING---No  TEMP >100.0 -----No  UNEXPLAINED BODY ACHES------No  CHILLS -------No-   HEADACHES ---------No  LOSS OF SMELL/ TASTE --------No    HAVE YOU OR ANY FAMILY MEMBER TRAVELLED PAST 14 DAYS OUT OF THE   COUNTY---No STATE----No COUNTRY----No  HAVE YOU OR ANY FAMILY MEMBER BEEN EXPOSED TO ANYONE WITH COVID 19? No    

## 2019-03-09 ENCOUNTER — Ambulatory Visit: Payer: Self-pay

## 2019-03-09 LAB — NOVEL CORONAVIRUS, NAA (HOSP ORDER, SEND-OUT TO REF LAB; TAT 18-24 HRS): SARS-CoV-2, NAA: NOT DETECTED

## 2019-03-09 NOTE — Progress Notes (Signed)
Call placed to patient following negative COVID-19 test result. Advised can begin prep for colonoscopy 03/10/2019. Per patient he has quarantined at home since test and has not has any symptoms.

## 2019-03-09 NOTE — Anesthesia Preprocedure Evaluation (Addendum)
Anesthesia Evaluation  Patient identified by MRN, date of birth, ID band Patient awake    History of Anesthesia Complications Negative for: history of anesthetic complications  Airway Mallampati: III  TM Distance: >3 FB Neck ROM: Full    Dental  (+) Dental Advisory Given   Pulmonary sleep apnea (no longer uses CPAP) ,  03/07/2019 novel coronavirus NEG   breath sounds clear to auscultation       Cardiovascular hypertension, Pt. on medications and Pt. on home beta blockers (-) angina+ CAD, + CABG and +CHF (entresto)  + Cardiac Defibrillator + Valvular Problems/Murmurs (s/p ASD repair, s/p impella)  Rhythm:Regular Rate:Normal  '19 ECHO: EF 27%, anterolateral akinesis, mild TR   Neuro/Psych negative neurological ROS     GI/Hepatic negative GI ROS, Neg liver ROS,   Endo/Other  neg diabetes  Renal/GU Renal InsufficiencyRenal disease     Musculoskeletal   Abdominal   Peds  Hematology negative hematology ROS (+)   Anesthesia Other Findings   Reproductive/Obstetrics                           Anesthesia Physical Anesthesia Plan  ASA: IV  Anesthesia Plan: MAC   Post-op Pain Management:    Induction:   PONV Risk Score and Plan: 1 and Treatment may vary due to age or medical condition  Airway Management Planned: Simple Face Mask  Additional Equipment:   Intra-op Plan:   Post-operative Plan:   Informed Consent: I have reviewed the patients History and Physical, chart, labs and discussed the procedure including the risks, benefits and alternatives for the proposed anesthesia with the patient or authorized representative who has indicated his/her understanding and acceptance.     Dental advisory given  Plan Discussed with: CRNA and Surgeon  Anesthesia Plan Comments:        Anesthesia Quick Evaluation

## 2019-03-10 ENCOUNTER — Encounter: Payer: Self-pay | Admitting: Cardiology

## 2019-03-10 ENCOUNTER — Ambulatory Visit (HOSPITAL_COMMUNITY)
Admission: RE | Admit: 2019-03-10 | Discharge: 2019-03-10 | Disposition: A | Discharge status: Home / Self Care | Payer: Medicare Other | Attending: Gastroenterology | Admitting: Gastroenterology

## 2019-03-10 ENCOUNTER — Encounter (HOSPITAL_COMMUNITY)
Admission: RE | Disposition: A | Discharge status: Home / Self Care | Payer: Self-pay | Source: Home / Self Care | Attending: Gastroenterology

## 2019-03-10 ENCOUNTER — Ambulatory Visit (HOSPITAL_COMMUNITY): Payer: Medicare Other | Admitting: Anesthesiology

## 2019-03-10 ENCOUNTER — Encounter (HOSPITAL_COMMUNITY): Payer: Self-pay | Admitting: Registered Nurse

## 2019-03-10 ENCOUNTER — Other Ambulatory Visit: Payer: Self-pay

## 2019-03-10 DIAGNOSIS — I130 Hypertensive heart and chronic kidney disease with heart failure and stage 1 through stage 4 chronic kidney disease, or unspecified chronic kidney disease: Secondary | ICD-10-CM | POA: Diagnosis not present

## 2019-03-10 DIAGNOSIS — Z87442 Personal history of urinary calculi: Secondary | ICD-10-CM | POA: Diagnosis not present

## 2019-03-10 DIAGNOSIS — I509 Heart failure, unspecified: Secondary | ICD-10-CM | POA: Diagnosis not present

## 2019-03-10 DIAGNOSIS — Z79899 Other long term (current) drug therapy: Secondary | ICD-10-CM | POA: Insufficient documentation

## 2019-03-10 DIAGNOSIS — N182 Chronic kidney disease, stage 2 (mild): Secondary | ICD-10-CM | POA: Insufficient documentation

## 2019-03-10 DIAGNOSIS — E1122 Type 2 diabetes mellitus with diabetic chronic kidney disease: Secondary | ICD-10-CM | POA: Diagnosis not present

## 2019-03-10 DIAGNOSIS — Z7982 Long term (current) use of aspirin: Secondary | ICD-10-CM | POA: Insufficient documentation

## 2019-03-10 DIAGNOSIS — Z95810 Presence of automatic (implantable) cardiac defibrillator: Secondary | ICD-10-CM | POA: Diagnosis not present

## 2019-03-10 DIAGNOSIS — Z86010 Personal history of colonic polyps: Secondary | ICD-10-CM | POA: Insufficient documentation

## 2019-03-10 DIAGNOSIS — Z888 Allergy status to other drugs, medicaments and biological substances status: Secondary | ICD-10-CM | POA: Diagnosis not present

## 2019-03-10 DIAGNOSIS — G4730 Sleep apnea, unspecified: Secondary | ICD-10-CM | POA: Diagnosis not present

## 2019-03-10 DIAGNOSIS — E785 Hyperlipidemia, unspecified: Secondary | ICD-10-CM | POA: Diagnosis not present

## 2019-03-10 DIAGNOSIS — Z951 Presence of aortocoronary bypass graft: Secondary | ICD-10-CM | POA: Insufficient documentation

## 2019-03-10 DIAGNOSIS — K648 Other hemorrhoids: Secondary | ICD-10-CM | POA: Diagnosis not present

## 2019-03-10 DIAGNOSIS — K5730 Diverticulosis of large intestine without perforation or abscess without bleeding: Secondary | ICD-10-CM | POA: Insufficient documentation

## 2019-03-10 DIAGNOSIS — E559 Vitamin D deficiency, unspecified: Secondary | ICD-10-CM | POA: Insufficient documentation

## 2019-03-10 DIAGNOSIS — D123 Benign neoplasm of transverse colon: Secondary | ICD-10-CM | POA: Insufficient documentation

## 2019-03-10 DIAGNOSIS — Z1211 Encounter for screening for malignant neoplasm of colon: Secondary | ICD-10-CM | POA: Diagnosis not present

## 2019-03-10 HISTORY — PX: COLONOSCOPY WITH PROPOFOL: SHX5780

## 2019-03-10 HISTORY — PX: BIOPSY: SHX5522

## 2019-03-10 SURGERY — COLONOSCOPY WITH PROPOFOL
Anesthesia: Monitor Anesthesia Care

## 2019-03-10 MED ORDER — PROPOFOL 10 MG/ML IV BOLUS
INTRAVENOUS | Status: AC
  Filled 2019-03-10: qty 60

## 2019-03-10 MED ORDER — LACTATED RINGERS IV SOLN
INTRAVENOUS | Status: DC
  Administered 2019-03-10: 07:00:00 via INTRAVENOUS

## 2019-03-10 MED ORDER — SODIUM CHLORIDE 0.9 % IV SOLN: INTRAVENOUS | Status: DC

## 2019-03-10 MED ORDER — ETOMIDATE 2 MG/ML IV SOLN
INTRAVENOUS | Status: DC | PRN
  Administered 2019-03-10: 4 mg via INTRAVENOUS
  Administered 2019-03-10: 2 mg via INTRAVENOUS

## 2019-03-10 SURGICAL SUPPLY — 22 items

## 2019-03-10 NOTE — H&P (Signed)
Luis [name] is an 73 y.o. male.   Chief Complaint: Colorectal cancer screening. HPI: 73 year old [name] male here for a colonoscopy. See office notes for details.  Past Medical History:  Diagnosis Date  . AICD (automatic cardioverter/defibrillator) present 12/15/2017  . Chronic kidney disease    kidney stones; noted per H&P per Dr [name] 02/06/2015 chronic kidney disease stage II  . Diabetes mellitus without complication ([place])    per H&P Dr [name] noted to be prediabetic 02/06/2015; pt denies DM on 12/15/2017  . Dyslipidemia   . History of hepatomegaly    with fatty liver discussed per H&P per Dr [name] 02/06/2015 secondary to abd ultrasound   . History of kidney stones   . Sleep apnea    AHI 17 on sleep study 08/23/2013 pt states was given CPAP machine but sent it back - does not use now 12/15/2017  . Vitamin D deficiency    as noted per H&P per Dr [name] 02/06/2015   Past Surgical History:  Procedure Laterality Date  . ASD REPAIR N/A 03/25/2017   Procedure: ATRIAL SEPTAL DEFECT (ASD) REPAIR;  Surgeon: [name], MD;  Location: [place];  Service: Open Heart Surgery;  Laterality: N/A;  . CARDIAC DEFIBRILLATOR PLACEMENT  12/15/2017  . CORONARY ARTERY BYPASS GRAFT N/A 03/25/2017   Procedure: CORONARY ARTERY BYPASS GRAFTING (CABG) x 2 using left internal mammary artery and right greater saphenous leg vein using endosciope.;  Surgeon: [name], MD;  Location: [place];  Service: Open Heart Surgery;  Laterality: N/A;  . CYSTOSCOPY W/ RETROGRADES Left 03/26/2015   Procedure: CYSTOSCOPY WITH RETROGRADE PYELOGRAM;  Surgeon: [name], MD;  [facility];  Service: Urology;  Laterality: Left;  . ICD IMPLANT N/A 12/15/2017   Procedure: ICD IMPLANT;  Surgeon: [name], MD;  Location: [place] [facility];  Service: Cardiovascular;  Laterality: N/A;  . LEFT HEART CATH AND CORONARY ANGIOGRAPHY N/A 03/25/2017   Procedure: Left Heart Cath and Coronary Angiography;  Surgeon:  [name], MD;  Location: [place] [facility];  Service: Cardiovascular;  Laterality: N/A;  . PLACEMENT OF IMPELLA LEFT VENTRICULAR ASSIST DEVICE  03/25/2017   Procedure: PLACEMENT OF IMPELLA LEFT VENTRICULAR ASSIST DEVICE;  Surgeon: [name], MD;  Location: [place];  Service: Open Heart Surgery;;  . REMOVAL OF IMPELLA LEFT VENTRICULAR ASSIST DEVICE N/A 03/31/2017   Procedure: REMOVAL OF IMPELLA LEFT VENTRICULAR ASSIST DEVICE;  Surgeon: [name], MD;  Location: [place];  Service: Open Heart Surgery;  Laterality: N/A;  . ROBOT ASSISTED PYELOPLASTY Left 03/26/2015   Procedure: ROBOTIC ASSISTED PYELOPLASTY WITH STENT PLACEMENT, REMOVAL OF STONE AND CYST DECORTICATION;  Surgeon: [name], MD;  [facility];  Service: Urology;  Laterality: Left;  . TEE WITHOUT CARDIOVERSION N/A 03/25/2017   Procedure: TRANSESOPHAGEAL ECHOCARDIOGRAM (TEE);  Surgeon: [name], [name], MD;  Location: [place];  Service: Open Heart Surgery;  Laterality: N/A;  . TEE WITHOUT CARDIOVERSION N/A 03/31/2017   Procedure: TRANSESOPHAGEAL ECHOCARDIOGRAM (TEE);  Surgeon: [name], [name], MD;  Location: [place];  Service: Open Heart Surgery;  Laterality: N/A;   Family History  Problem Relation Age of Onset  . Healthy Mother   . Healthy Father   . Healthy Sister   . Healthy Brother    Social History:  reports that he has never smoked. He has never used smokeless tobacco. He reports that he does not drink alcohol or use drugs.  Allergies:  Allergies  Allergen Reactions  .  Crestor [Rosuvastatin Calcium] Other (See Comments)    myalgias   Medications Prior to Admission  Medication Sig Dispense Refill  . aspirin EC 81 MG tablet Take 81 mg by mouth daily.    . carvedilol (COREG) 3.125 MG tablet Take 1 tablet (3.125 mg total) by mouth 2 (two) times daily for 30 days. Pt needs to call and make appt to get further refills - 1st attempt 60 tablet 0  . cholecalciferol (VITAMIN D) 1000 units tablet Take 1,000 Units by  mouth daily.    . Coenzyme Q10 (COQ10) 200 MG CAPS Take 200 mg by mouth daily.    . Evolocumab (REPATHA SURECLICK) 697 MG/ML SOAJ Inject 140 mg into the skin every 14 (fourteen) days.    Marland Kitchen PEG 3350-KCl-NaBcb-NaCl-NaSulf (PEG-3350/ELECTROLYTES) 236 g SOLR Take 4,000 mLs by mouth once.     . sacubitril-valsartan (ENTRESTO) 24-26 MG Take 1 tablet by mouth 2 (two) times daily.    . vitamin B-12 (CYANOCOBALAMIN) 1000 MCG tablet Take 1,000 mcg by mouth daily.      No results found for this or any previous visit (from the past 48 hour(s)). No results found.  Review of Systems  Constitutional: Negative.   HENT: Negative.   Eyes: Negative.   Respiratory: Negative.   Cardiovascular: Negative.   Gastrointestinal: Negative.   Genitourinary: Negative.   Musculoskeletal: Negative.   Skin: Negative.   Neurological: Negative.   Endo/Heme/Allergies: Negative.   Psychiatric/Behavioral: Negative.    Blood pressure 120/71, pulse 74, temperature 98.4 F (36.9 C), temperature source Oral, resp. rate 16, height 5\' 7"  (1.702 m), weight 81.6 kg, SpO2 97 %. Physical Exam  Constitutional: He is oriented to person, place, and time. He appears well-developed and well-nourished.  HENT:  Head: Normocephalic and atraumatic.  Eyes: Pupils are equal, round, and reactive to light. Conjunctivae and EOM are normal.  Neck: Normal range of motion. Neck supple.  Cardiovascular: Normal rate and regular rhythm.  Respiratory: Effort normal and breath sounds normal.  Defibrillator palpable on left chest  GI: Soft. Bowel sounds are normal.  Musculoskeletal: Normal range of motion.  Neurological: He is alert and oriented to person, place, and time.  Skin: Skin is warm and dry.   Assessment/Plan Colorectal cancer screening: proceed with a colonoscopy at this time.  Teaghan Formica, MD 03/10/2019, 7:18 AM

## 2019-03-10 NOTE — Anesthesia Postprocedure Evaluation (Signed)
Anesthesia Post Note  Patient: Luis [name]  Procedure(s) Performed: COLONOSCOPY WITH PROPOFOL (N/A ) BIOPSY     Patient location during evaluation: Endoscopy Anesthesia Type: MAC Level of consciousness: awake and alert, oriented and patient cooperative Pain management: pain level controlled Vital Signs Assessment: post-procedure vital signs reviewed and stable Respiratory status: spontaneous breathing, nonlabored ventilation and respiratory function stable Cardiovascular status: blood pressure returned to baseline and stable Postop Assessment: no apparent nausea or vomiting Anesthetic complications: no    Last Vitals:  Vitals:   03/10/19 0757 03/10/19 0800  BP: 117/78 122/81  Pulse:  68  Resp: 16 15  Temp: 36.9 C   SpO2:  100%    Last Pain:  Vitals:   03/10/19 0757  TempSrc: Oral  PainSc: 0-No pain                 Lulla Linville,E. Kolin Erdahl

## 2019-03-10 NOTE — Transfer of Care (Signed)
Immediate Anesthesia Transfer of Care Note  Patient: Luis [name]  Procedure(s) Performed: COLONOSCOPY WITH PROPOFOL (N/A ) BIOPSY  Patient Location: PACU  Anesthesia Type:MAC  Level of Consciousness: sedated  Airway & Oxygen Therapy: Patient Spontanous Breathing and Patient connected to face mask oxygen  Post-op Assessment: Report given to RN and Post -op Vital signs reviewed and stable  Post vital signs: Reviewed and stable  Last Vitals:  Vitals Value Taken Time  BP    Temp    Pulse    Resp    SpO2      Last Pain:  Vitals:   03/10/19 0648  TempSrc: Oral  PainSc: 0-No pain         Complications: No apparent anesthesia complications

## 2019-03-10 NOTE — Discharge Instructions (Signed)

## 2019-03-10 NOTE — Anesthesia Procedure Notes (Signed)
Date/Time: 03/10/2019 7:32 AM Performed by: Talbot Grumbling, CRNA Oxygen Delivery Method: Simple face mask

## 2019-03-10 NOTE — Op Note (Signed)
[facility] Patient Name: Luis [name] Procedure Date: 03/10/2019 MRN: [id number] Attending MD: Juanita Craver , MD Date of Birth: November 03, 1945 CSN: [id number] Age: 73 Admit Type: Outpatient Procedure:                Colonoscopy with cold biopsy x 1. Indications:              Personal history of adenomatous polyps; CRC                            screening for colorectal malignant neoplasm Providers:                Juanita Craver, MD, Cleda Daub, RN, Marguerita Merles, Technician, Marla Roe, CRNA. Referring MD:             Laverda Page, MD, [name] MD Medicines:                Monitored Anesthesia Care Complications:            No immediate complications. Estimated Blood Loss:     Estimated blood loss was minimal. Procedure:                Pre-Anesthesia Assessment: - Prior to the                            procedure, a history and physical was performed,                            and patient medications and allergies were                            reviewed. The patient's tolerance of previous                            anesthesia was also reviewed. The risks and                            benefits of the procedure and the sedation options                            and risks were discussed with the patient. All                            questions were answered, and informed consent was                            obtained. Prior Anticoagulants: The patient has                            taken no previous anticoagulant or antiplatelet                            agents. ASA Grade Assessment: III - A patient with  severe systemic disease. After reviewing the risks                            and benefits, the patient was deemed in                            satisfactory condition to undergo the procedure.                            After obtaining informed consent, the colonoscope                            was  passed under direct vision. Throughout the                            procedure, the patient's blood pressure, pulse, and                            oxygen saturations were monitored continuously. The                            CF-HQ190L ([id number] Olympus colonoscope was                            introduced through the and advanced to the. The                            colonoscopy was performed without difficulty. The                            patient tolerated the procedure well. The quality                            of the bowel preparation was good. The ileocecal                            valve, the appendiceal orifice and the rectum were                            photographed. The bowel preparation used was                            GoLYTELY via single dose instruction. Scope In: 7:36:12 AM Scope Out: 7:51:07 AM Scope Withdrawal Time: 0 hours 10 minutes 59 seconds  Total Procedure Duration: 0 hours 14 minutes 55 seconds  Findings:      A small sessile polyp was found in the mid-transverse colon-this was       removed by cold biopsy x 1.      A few small-mouthed diverticula were found in the ascending colon.      Small internal hemorrhoids were noted on retroflexion. Impression:               - One small polyp in the mid-transverse  colon-removed by cold biopsy x 1.                           - Few scattered diverticula in the ascending colon.                           - Small internal hemorrhoids. Moderate Sedation:      MAC used. Recommendation:           - High fiber diet with augmented water consumption                            daily.                           - Continue present medications.                           - Await pathology results.                           - Repeat colonoscopy is not recommended for                            surveillance due to patient's age.                           - Return to GI office PRN.                            - If the patient has any abnormal GI symptoms in                            the interim, he has been advised to call the office                            ASAP for further recommendations. Procedure Code(s):        --- Professional ---                           [phone], Colonoscopy, flexible; with biopsy, single                            or multiple Diagnosis Code(s):        --- Professional ---                           D12.3, Benign neoplasm of transverse colon (hepatic                            flexure or splenic flexure)                           K57.30, Diverticulosis of large intestine without                            perforation or abscess without  bleeding                           Z86.010, Personal history of colonic polyps                           Z12.11, Encounter for screening for malignant                            neoplasm of colon CPT copyright 2019 [employer]. All rights reserved. The codes documented in this report are preliminary and upon coder review may  be revised to meet current compliance requirements. Juanita Craver, MD Juanita Craver, MD 03/10/2019 8:21:59 AM This report has been signed electronically. Number of Addenda: 0

## 2019-03-11 ENCOUNTER — Encounter (HOSPITAL_COMMUNITY): Payer: Self-pay | Admitting: Gastroenterology

## 2019-03-20 ENCOUNTER — Encounter: Payer: Self-pay | Admitting: Cardiology

## 2019-03-24 ENCOUNTER — Ambulatory Visit: Payer: Medicare Other | Admitting: Cardiology

## 2019-03-24 ENCOUNTER — Encounter: Payer: Self-pay | Admitting: Cardiology

## 2019-03-24 ENCOUNTER — Other Ambulatory Visit: Payer: Self-pay

## 2019-03-24 VITALS — BP 104/69 | HR 69 | Ht 67.0 in | Wt 186.7 lb

## 2019-03-24 DIAGNOSIS — Z4502 Encounter for adjustment and management of automatic implantable cardiac defibrillator: Secondary | ICD-10-CM

## 2019-03-24 DIAGNOSIS — I255 Ischemic cardiomyopathy: Secondary | ICD-10-CM | POA: Diagnosis not present

## 2019-03-24 DIAGNOSIS — Z95810 Presence of automatic (implantable) cardiac defibrillator: Secondary | ICD-10-CM | POA: Diagnosis not present

## 2019-03-24 DIAGNOSIS — E7800 Pure hypercholesterolemia, unspecified: Secondary | ICD-10-CM

## 2019-03-24 DIAGNOSIS — I2510 Atherosclerotic heart disease of native coronary artery without angina pectoris: Secondary | ICD-10-CM | POA: Diagnosis not present

## 2019-03-24 DIAGNOSIS — Z951 Presence of aortocoronary bypass graft: Secondary | ICD-10-CM | POA: Diagnosis not present

## 2019-03-24 DIAGNOSIS — I5022 Chronic systolic (congestive) heart failure: Secondary | ICD-10-CM | POA: Diagnosis not present

## 2019-03-24 DIAGNOSIS — I509 Heart failure, unspecified: Secondary | ICD-10-CM

## 2019-03-24 MED ORDER — ENTRESTO 24-26 MG PO TABS: 1.0000 | ORAL_TABLET | Freq: Two times a day (BID) | ORAL | 3 refills | Status: DC

## 2019-03-24 MED ORDER — REPATHA SURECLICK 140 MG/ML ~~LOC~~ SOAJ: 140.0000 mg | SUBCUTANEOUS | 4 refills | Status: DC

## 2019-03-24 NOTE — Progress Notes (Signed)
Primary Physician/Referring:  [name], MD  Patient ID: Luis [name], male    DOB: 08-Jun-1946, 73 y.o.   MRN: [id number]  Chief Complaint  Patient presents with  . Cardiomyopathy    HPI: [name]  is a 73 y.o. male  with coronary artery disease with history of emergent CABG with LIMA to LAD and SVG to OM by Tharon Aquas Tright. His past medical history includes hyperlipidemia. Underwent single chamber Medtronic ICD implantation on 12/15/2017 for primary prevention in view of persistent low LVEF. Patient is here on a 10-monthoffice visit and follow-up. He is doing well and denies dyspnea, leg edema, or angina pectoris.  Past Medical History:  Diagnosis Date  . AICD (automatic cardioverter/defibrillator) present 12/15/2017  . Chronic kidney disease    kidney stones; noted per H&P per Dr MMellody Drown5/06/2015 chronic kidney disease stage II  . Dyslipidemia   . History of hepatomegaly    with fatty liver discussed per H&P per Dr MMellody Drown5/06/2015 secondary to abd ultrasound   . History of kidney stones   . Sleep apnea    AHI 17 on sleep study 08/23/2013 pt states was given CPAP machine but sent it back - does not use now 12/15/2017  . Vitamin D deficiency    as noted per H&P per Dr MMellody Drown5/06/2015    Past Surgical History:  Procedure Laterality Date  . ASD REPAIR N/A 03/25/2017   Procedure: ATRIAL SEPTAL DEFECT (ASD) REPAIR;  Surgeon: VIvin Poot MD;  [place] [facility]  Service: Open Heart Surgery;  Laterality: N/A;  . BIOPSY  03/10/2019   Procedure: BIOPSY;  Surgeon: MJuanita Craver MD;  Location: [facility];  Service: Endoscopy;;  . CARDIAC DEFIBRILLATOR PLACEMENT  12/15/2017  . COLONOSCOPY WITH PROPOFOL N/A 03/10/2019   Procedure: COLONOSCOPY WITH PROPOFOL;  Surgeon: MJuanita Craver MD;  [facility]: [facility];  Service: Endoscopy;  Laterality: N/A;  . CORONARY ARTERY BYPASS GRAFT N/A 03/25/2017   Procedure: CORONARY ARTERY BYPASS GRAFTING (CABG) x 2 using left internal  mammary artery and right greater saphenous leg vein using endosciope.;  Surgeon: VIvin Poot MD;  [place] [facility]  Service: Open Heart Surgery;  Laterality: N/A;  . CYSTOSCOPY W/ RETROGRADES Left 03/26/2015   Procedure: CYSTOSCOPY WITH RETROGRADE PYELOGRAM;  Surgeon: TAlexis Frock MD;  [facility];  Service: Urology;  Laterality: Left;  . ICD IMPLANT N/A 12/15/2017   Procedure: ICD IMPLANT;  Surgeon: CConstance Haw MD;  [place] [facility];  Service: Cardiovascular;  Laterality: N/A;  . LEFT HEART CATH AND CORONARY ANGIOGRAPHY N/A 03/25/2017   Procedure: Left Heart Cath and Coronary Angiography;  Surgeon: GAdrian Prows MD;  [place] [facility];  Service: Cardiovascular;  Laterality: N/A;  . PLACEMENT OF IMPELLA LEFT VENTRICULAR ASSIST DEVICE  03/25/2017   Procedure: PLACEMENT OF IMPELLA LEFT VENTRICULAR ASSIST DEVICE;  Surgeon: VIvin Poot MD;  [place] [facility]  Service: Open Heart Surgery;;  . REMOVAL OF IMPELLA LEFT VENTRICULAR ASSIST DEVICE N/A 03/31/2017   Procedure: REMOVAL OF IMPELLA LEFT VENTRICULAR ASSIST DEVICE;  Surgeon: VIvin Poot MD;  [place]  Service: Open Heart Surgery;  Laterality: N/A;  . ROBOT ASSISTED PYELOPLASTY Left 03/26/2015   Procedure: ROBOTIC ASSISTED PYELOPLASTY WITH STENT PLACEMENT, REMOVAL OF STONE AND CYST DECORTICATION;  Surgeon: TAlexis Frock MD;  [facility];  Service: Urology;  Laterality: Left;  . TEE WITHOUT CARDIOVERSION N/A 03/25/2017   Procedure: TRANSESOPHAGEAL ECHOCARDIOGRAM (TEE);  Surgeon: VPrescott Gum PCollier Salina  MD;  [facility];  Service: Open Heart Surgery;  Laterality: N/A;  . TEE WITHOUT CARDIOVERSION N/A 03/31/2017   Procedure: TRANSESOPHAGEAL ECHOCARDIOGRAM (TEE);  Surgeon: [name], [name], MD;  Location: [place];  Service: Open Heart Surgery;  Laterality: N/A;    Social History   Socioeconomic History  . Marital status: Married    Spouse name: Not on file  . Number of children: 3  . Years  of education: Not on file  . Highest education level: Not on file  Occupational History  . Not on file  Social Needs  . Financial resource strain: Not on file  . Food insecurity    Worry: Not on file    Inability: Not on file  . Transportation needs    Medical: Not on file    Non-medical: Not on file  Tobacco Use  . Smoking status: Never Smoker  . Smokeless tobacco: Never Used  Substance and Sexual Activity  . Alcohol use: No  . Drug use: No  . Sexual activity: Not on file  Lifestyle  . Physical activity    Days per week: Not on file    Minutes per session: Not on file  . Stress: Not on file  Relationships  . Social Herbalist on phone: Not on file    Gets together: Not on file    Attends religious service: Not on file    Active member of club or organization: Not on file    Attends meetings of clubs or organizations: Not on file    Relationship status: Not on file  . Intimate partner violence    Fear of current or ex partner: Not on file    Emotionally abused: Not on file    Physically abused: Not on file    Forced sexual activity: Not on file  Other Topics Concern  . Not on file  Social History Narrative  . Not on file   Review of Systems  Constitution: Negative for chills, decreased appetite, malaise/fatigue and weight gain.  Cardiovascular: Negative for dyspnea on exertion, leg swelling and syncope.  Endocrine: Negative for cold intolerance.  Hematologic/Lymphatic: Does not bruise/bleed easily.  Musculoskeletal: Negative for joint swelling.  Gastrointestinal: Negative for abdominal pain, anorexia, change in bowel habit, hematochezia and melena.  Neurological: Negative for headaches and light-headedness.  Psychiatric/Behavioral: Negative for depression and substance abuse.  All other systems reviewed and are negative.   Objective  Blood pressure 104/69, pulse 69, height '5\' 7"'$  (1.702 m), weight 186 lb 11.2 oz (84.7 kg), SpO2 97 %. Body mass index is  29.24 kg/m.    Physical Exam  Constitutional: He appears well-developed and well-nourished. No distress.  HENT:  Head: Atraumatic.  Eyes: Conjunctivae are normal.  Neck: Neck supple. No JVD present. No thyromegaly present.  Cardiovascular: Normal rate, regular rhythm, normal heart sounds and intact distal pulses. Exam reveals no gallop.  No murmur heard. Pulmonary/Chest: Effort normal and breath sounds normal.  Pacemaker/ICD site noted  in the left infraclavicular fossa.   Abdominal: Soft. Bowel sounds are normal.  Musculoskeletal: Normal range of motion.  Neurological: He is alert.  Skin: Skin is warm and dry.  Psychiatric: He has a normal mood and affect.   Radiology: No results found.  Laboratory examination:   Labs 03/17/2019: Total cholesterol 114, triglycerides 180, HDL 41, LDL 37.  Non-HDL cholesterol 73.  HB 15.1/HCT 44.8, platelets 291.  Potassium 5.4, BUN 12, creatinine 1.1, eGFR 63/73 ML.  A1c 6.1%.  CMP Latest Ref Rng & Units 12/10/2017 04/17/2017 04/16/2017  Glucose 65 - 99 mg/dL 104(H) 93 102(H)  BUN 8 - 27 mg/dL _0 Creatinine 0.76 - 1.27 mg/dL 1.00 1.19 1.18  Sodium 134 - 144 mmol/L 140 134(L) 133(L)  Potassium 3.5 - 5.2 mmol/L 5.1 4.3 4.8  Chloride 96 - 106 mmol/L 104 101 100(L)  CO2 20 - 29 mmol/L _1 Calcium 8.6 - 10.2 mg/dL 9.4 8.5(L) 8.4(L)  Total Protein 6.5 - 8.1 g/dL - - -  Total Bilirubin 0.3 - 1.2 mg/dL - - -  Alkaline Phos 38 - 126 U/L - - -  AST 15 - 41 U/L - - -  ALT 17 - 63 U/L - - -   CBC Latest Ref Rng & Units 12/10/2017 04/16/2017 04/10/2017  WBC 3.4 - 10.8 x10E3/uL 6.5 7.7 8.2  Hemoglobin 13.0 - 17.7 g/dL 13.8 9.9(L) 10.0(L)  Hematocrit 37.5 - 51.0 % 40.5 30.8(L) 31.9(L)  Platelets 150 - 379 x10E3/uL 243 414(H) 605(H)   Lipid Panel  No results found for: CHOL, TRIG, HDL, CHOLHDL, VLDL, LDLCALC, LDLDIRECT HEMOGLOBIN A1C Lab Results  Component Value Date   HGBA1C 6.1 (H) 03/26/2017   MPG 128 03/26/2017   TSH No results for  input(s): TSH in the last 8760 hours.  Medications   Current Outpatient Medications  Medication Instructions  . aspirin EC 81 mg, Oral, Daily  . carvedilol (COREG) 3.125 mg, Oral, 2 times daily with meals  . cholecalciferol (VITAMIN D) 1,000 Units, Oral, Daily  . CoQ10 200 mg, Oral, Daily  . Repatha SureClick 573 mg, Subcutaneous, Every 14 days  . sacubitril-valsartan (ENTRESTO) 24-26 MG 1 tablet, Oral, 2 times daily  . vitamin B-12 (CYANOCOBALAMIN) 1,000 mcg, Oral, Daily   Cardiac Studies:  Echocardiogram 05/25/2018: Left ventricle cavity is normal in size. Mild concentric hypertrophy of the left ventricle. LAD territory akinesis. Doppler evidence of grade I (impaired) diastolic dysfunction, normal LAP. LVEF 25-30%. Left atrial cavity is mildly dilated. No significant valvular abnormality. Inadequate tricuspid regurgitation jet to estimate pulmonary artery pressure. Normal right atrial pressure.  Assessment   Coronary artery disease involving native coronary artery of native heart without angina pectoris - Plan: Evolocumab (REPATHA SURECLICK) 220 MG/ML SOAJ  Hx of CABG - Emergent CABG with LIMA to LAD and SVG to OM on 03/25/2017 Dr. Nils Pyle.  Ischemic cardiomyopathy   Chronic systolic heart failure ([place]) - Plan: sacubitril-valsartan (ENTRESTO) 24-26 MG  Hypercholesteremia - Plan: Evolocumab (REPATHA SURECLICK) 254 MG/ML SOAJ  ICD: Medtronic  single chamber Visia AF MRI VR [id number] ICD -  12/15/2017    EKG 10/14/2018: Normal sinus rhythm at rate of 76 bpm, normal axis. Anteroseptal infarct old. Nonspecific T abnormality. No significant change from EKG 04/17/2018  Remote ICD check  03/15/2019:   No VHR episodes, no mode switches. OptiVol Fluid Index do no present significant abnormalities. Battery longevity is 9.8-11.4 years. RV pacing is <0.1 %.  Recommendations:   Patient is here on a six-month office visit and follow-up of coronary artery disease and congestive heart  failure.  He is presently doing well and is very well compensated with regard to heart failure.  He has not been able to tolerate greater than 3.125 mg of carvedilol due to marked hypotension and dizziness.  He is also not able to tolerate greater than the lowest dose of contrast to at 49/51.  He was also statin intolerant and is presently on BiPAP with excellent control of his lipids  except as triglycerides are elevated, I have discussed making dietary changes especially reducing the carbohydrate intake.  ICD interrogation was discussed with the patient, no clinical evidence of heart failure as well as OptiVol.  No changes in the medications were done today.  I will see him back in 6 months for follow-up.  I could consider repeating echocardiogram on his next office visit.  [name], MD, [facility] 03/26/2019, 6:46 AM [facility] [place] Pager: [phone] Office: [phone] If no answer Cell [phone]

## 2019-03-26 ENCOUNTER — Encounter: Payer: Self-pay | Admitting: Cardiology

## 2019-03-26 MED ORDER — CARVEDILOL 3.125 MG PO TABS: 3.1250 mg | ORAL_TABLET | Freq: Two times a day (BID) | ORAL | 3 refills | Status: DC

## 2019-03-28 ENCOUNTER — Encounter: Payer: Self-pay | Admitting: Orthopedic Surgery

## 2019-03-28 ENCOUNTER — Ambulatory Visit (INDEPENDENT_AMBULATORY_CARE_PROVIDER_SITE_OTHER): Payer: Medicare Other | Admitting: Orthopedic Surgery

## 2019-03-28 ENCOUNTER — Other Ambulatory Visit: Payer: Self-pay

## 2019-03-28 ENCOUNTER — Ambulatory Visit: Payer: Self-pay

## 2019-03-28 ENCOUNTER — Telehealth: Payer: Self-pay | Admitting: Radiology

## 2019-03-28 DIAGNOSIS — M25512 Pain in left shoulder: Secondary | ICD-10-CM | POA: Diagnosis not present

## 2019-03-28 DIAGNOSIS — M7502 Adhesive capsulitis of left shoulder: Secondary | ICD-10-CM | POA: Diagnosis not present

## 2019-03-28 DIAGNOSIS — M25511 Pain in right shoulder: Secondary | ICD-10-CM

## 2019-03-28 DIAGNOSIS — G8929 Other chronic pain: Secondary | ICD-10-CM

## 2019-03-28 NOTE — Telephone Encounter (Signed)
Will send note once available.

## 2019-03-28 NOTE — Telephone Encounter (Signed)
Representative from Dr. Shella Spearing office called and would like office note to be sent to them when ready after patient's appointment today.

## 2019-03-30 ENCOUNTER — Encounter: Payer: Self-pay | Admitting: Orthopedic Surgery

## 2019-03-30 NOTE — Telephone Encounter (Signed)
Faxed note.

## 2019-03-30 NOTE — Progress Notes (Signed)
Office Visit Note   Patient: Luis [name]           Date of Birth: 1946/03/11           MRN: [id number] Visit Date: 03/28/2019 Requested by: [name], MD [id number] [employer],  [place] [address] PCP: [name], MD  Subjective: Chief Complaint  Patient presents with  . Right Shoulder - Pain  . Left Shoulder - Pain    HPI: Patient presents with bilateral shoulder pain right worse than left.  He is right-hand dominant.  He is had pain for about 2 years.  Had coronary artery bypass grafting 2 years ago.  Reports decreased range of motion.  Patient does not wake from sleep with pain.  He is tried exercises physical therapy without much relief.  Has had injections x2 without relief.  He reports diminished range of motion.              ROS: All systems reviewed are negative as they relate to the chief complaint within the history of present illness.  Patient denies  fevers or chills.   Assessment & Plan: Visit Diagnoses:  1. Chronic pain of both shoulders   2. Adhesive capsulitis of left shoulder     Plan: Impression is bilateral adhesive capsulitis of the shoulders with no evidence of rotator cuff pathology or arthritis on examination and radiographs today.  I think he would do well with the least and attempt at fluoroscopically guided intra-articular injection with Dr. Ernestina Patches.  We will get that done in both shoulders and then see him back in 4 weeks.  If that fails to relieve his symptoms then he would be a candidate for shoulder manipulation with CPM machine postop for about a week 6 hours a day.  I will see him back in 4 weeks and we can discuss that further but he may have a refractory type of frozen shoulder which may end up requiring surgery.  Follow-Up Instructions: Return in about 4 weeks (around 04/25/2019).   Orders:  Orders Placed This Encounter  Procedures  . XR Shoulder Right  . XR Shoulder Left  . Ambulatory referral to Physical Medicine Rehab   No orders of the  defined types were placed in this encounter.     Procedures: No procedures performed   Clinical Data: No additional findings.  Objective: Vital Signs: There were no vitals taken for this visit.  Physical Exam:   Constitutional: Patient appears well-developed HEENT:  Head: Normocephalic Eyes:EOM are normal Neck: Normal range of motion Cardiovascular: Normal rate Pulmonary/chest: Effort normal Neurologic: Patient is alert Skin: Skin is warm Psychiatric: Patient has normal mood and affect    Ortho Exam: Ortho exam demonstrates some limitation of external rotation forward flexion abduction bilaterally.  These are all limited by about 30degrees.  Cuff strength is intact and there is no coarse grinding or crepitus in either shoulder with internal and external rotation of that right shoulder.  Motor sensory function to the hand is intact.  Neck range of motion is good.  No other masses lymphadenopathy or skin changes noted in that shoulder girdle region.  Specialty Comments:  No specialty comments available.  Imaging: No results found.   PMFS History: Patient Active Problem List   Diagnosis Date Noted  . Ischemic cardiomyopathy 12/15/2017  . ICD: Medtronic  single chamber Visia AF MRI VR [id number] ICD -  12/15/2017  12/15/2017  . Dyspnea 04/16/2017  . Acute combined systolic and diastolic heart failure ([place]) 04/16/2017  .  Pneumothorax on right   . Cardiogenic shock ([place]) 03/28/2017  . 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 Acute respiratory failure with hypoxia ([place]) 03/28/2017  . AKI (acute kidney injury) ([place]) 03/28/2017  . LVAD (left ventricular assist device) present ([place])   . Coronary artery disease 03/25/2017  . Chest pain due to CAD ([employer]) 03/24/2017  . 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   Past Medical History:  Diagnosis Date  . AICD (automatic cardioverter/defibrillator) present 12/15/2017  . Chronic kidney disease    kidney stones; noted per H&P per Dr  [name] 02/06/2015 chronic kidney disease stage II  . Dyslipidemia   . History of hepatomegaly    with fatty liver discussed per H&P per Dr [name] 02/06/2015 secondary to abd ultrasound   . History of kidney stones   . Sleep apnea    AHI 17 on sleep study 08/23/2013 pt states was given CPAP machine but sent it back - does not use now 12/15/2017  . Vitamin D deficiency    as noted per H&P per Dr [name] 02/06/2015    Family History  Problem Relation Age of Onset  . Healthy Mother   . Healthy Father   . Healthy Sister   . Healthy Brother     Past Surgical History:  Procedure Laterality Date  . ASD REPAIR N/A 03/25/2017   Procedure: ATRIAL SEPTAL DEFECT (ASD) REPAIR;  Surgeon: [name], MD;  Location: [facility];  Service: Open Heart Surgery;  Laterality: N/A;  . BIOPSY  03/10/2019   Procedure: BIOPSY;  Surgeon: Juanita Craver, MD;  [facility];  Service: Endoscopy;;  . CARDIAC DEFIBRILLATOR PLACEMENT  12/15/2017  . COLONOSCOPY WITH PROPOFOL N/A 03/10/2019   Procedure: COLONOSCOPY WITH PROPOFOL;  Surgeon: Juanita Craver, MD;  [facility];  Service: Endoscopy;  Laterality: N/A;  . CORONARY ARTERY BYPASS GRAFT N/A 03/25/2017   Procedure: CORONARY ARTERY BYPASS GRAFTING (CABG) x 2 using left internal mammary artery and right greater saphenous leg vein using endosciope.;  Surgeon: [name], MD;  Location: [facility];  Service: Open Heart Surgery;  Laterality: N/A;  . CYSTOSCOPY W/ RETROGRADES Left 03/26/2015   Procedure: CYSTOSCOPY WITH RETROGRADE PYELOGRAM;  Surgeon: [name], MD;  [facility];  Service: Urology;  Laterality: Left;  . ICD IMPLANT N/A 12/15/2017   Procedure: ICD IMPLANT;  Surgeon: [name], MD;  Location: [facility];  Service: Cardiovascular;  Laterality: N/A;  . LEFT HEART CATH AND CORONARY ANGIOGRAPHY N/A 03/25/2017   Procedure: Left Heart Cath and Coronary Angiography;  Surgeon: [name], MD;  Location: [facility];   Service: Cardiovascular;  Laterality: N/A;  . PLACEMENT OF IMPELLA LEFT VENTRICULAR ASSIST DEVICE  03/25/2017   Procedure: PLACEMENT OF IMPELLA LEFT VENTRICULAR ASSIST DEVICE;  Surgeon: [name], MD;  Location: [facility];  Service: Open Heart Surgery;;  . REMOVAL OF IMPELLA LEFT VENTRICULAR ASSIST DEVICE N/A 03/31/2017   Procedure: REMOVAL OF IMPELLA LEFT VENTRICULAR ASSIST DEVICE;  Surgeon: [name], MD;  Location: [facility];  Service: Open Heart Surgery;  Laterality: N/A;  . ROBOT ASSISTED PYELOPLASTY Left 03/26/2015   Procedure: ROBOTIC ASSISTED PYELOPLASTY WITH STENT PLACEMENT, REMOVAL OF STONE AND CYST DECORTICATION;  Surgeon: [name], MD;  [facility];  Service: Urology;  Laterality: Left;  . TEE WITHOUT CARDIOVERSION N/A 03/25/2017   Procedure: TRANSESOPHAGEAL ECHOCARDIOGRAM (TEE);  Surgeon: [name], [name], MD;  Location: [facility];  Service: Open Heart Surgery;  Laterality: N/A;  .  TEE WITHOUT CARDIOVERSION N/A 03/31/2017   Procedure: TRANSESOPHAGEAL ECHOCARDIOGRAM (TEE);  Surgeon: [name], [name], MD;  Location: [facility];  Service: Open Heart Surgery;  Laterality: N/A;   Social History   Occupational History  . Not on file  Tobacco Use  . Smoking status: Never Smoker  . Smokeless tobacco: Never Used  Substance and Sexual Activity  . Alcohol use: No  . Drug use: No  . Sexual activity: Not on file

## 2019-04-11 ENCOUNTER — Ambulatory Visit (INDEPENDENT_AMBULATORY_CARE_PROVIDER_SITE_OTHER): Payer: Medicare Other | Admitting: Cardiology

## 2019-04-11 ENCOUNTER — Other Ambulatory Visit: Payer: Self-pay

## 2019-04-11 DIAGNOSIS — Z95810 Presence of automatic (implantable) cardiac defibrillator: Secondary | ICD-10-CM

## 2019-04-11 DIAGNOSIS — I255 Ischemic cardiomyopathy: Secondary | ICD-10-CM

## 2019-04-11 DIAGNOSIS — I2510 Atherosclerotic heart disease of native coronary artery without angina pectoris: Secondary | ICD-10-CM

## 2019-04-11 DIAGNOSIS — E7800 Pure hypercholesterolemia, unspecified: Secondary | ICD-10-CM

## 2019-04-11 DIAGNOSIS — I5022 Chronic systolic (congestive) heart failure: Secondary | ICD-10-CM

## 2019-04-11 DIAGNOSIS — Z4502 Encounter for adjustment and management of automatic implantable cardiac defibrillator: Secondary | ICD-10-CM

## 2019-04-11 MED ORDER — REPATHA SURECLICK 140 MG/ML ~~LOC~~ SOAJ: 140.0000 mg | SUBCUTANEOUS | 4 refills | Status: DC

## 2019-04-11 NOTE — Progress Notes (Signed)
Encounter for assessment of implantable cardioverter-defibrillator (ICD)   ICD: Medtronic  single chamber Visia AF MRI VR [id number] ICD -  12/15/2017    Ischemic cardiomyopathy   Chronic systolic heart failure (HCC)   Hypercholesteremia - Plan: Evolocumab (REPATHA SURECLICK) 096 MG/ML SOAJ  Coronary artery disease involving native coronary artery of native heart without angina pectoris - Plan: Evolocumab (REPATHA SURECLICK) 283 MG/ML SOAJ,   Clinic ICD check 04/11/2019: Presently sinus rhythm at 80 bpm.  No VHR or AHR episodes.  Lead help is normal.  No therapy.  OptiVol although little baseline, is slightly trending up.  Longevity >10 years.  Normal function.  I have reviewed the ICD data with the patient, advised him of early CHF findings and to be careful with salt intake and excessive fluid intake.  Otherwise continue present medical therapy.  No changes were done today.  I have refilled his Repatha for hyperlipidemia.

## 2019-04-11 NOTE — Telephone Encounter (Signed)
Yes I did and patient is coming in today actually

## 2019-04-13 ENCOUNTER — Encounter: Payer: Self-pay | Admitting: Physical Medicine and Rehabilitation

## 2019-04-13 ENCOUNTER — Ambulatory Visit (INDEPENDENT_AMBULATORY_CARE_PROVIDER_SITE_OTHER): Payer: Medicare Other | Admitting: Physical Medicine and Rehabilitation

## 2019-04-13 ENCOUNTER — Ambulatory Visit: Payer: Self-pay

## 2019-04-13 ENCOUNTER — Other Ambulatory Visit: Payer: Self-pay

## 2019-04-13 DIAGNOSIS — M25511 Pain in right shoulder: Secondary | ICD-10-CM

## 2019-04-13 DIAGNOSIS — G8929 Other chronic pain: Secondary | ICD-10-CM

## 2019-04-13 NOTE — Progress Notes (Signed)
 .  Numeric Pain Rating Scale and Functional Assessment Average Pain 6   In the last MONTH (on 0-10 scale) has pain interfered with the following?  1. General activity like being  able to carry out your everyday physical activities such as walking, climbing stairs, carrying groceries, or moving a chair?  Rating(7)   +Dye Allergies(Contrast Media).

## 2019-04-13 NOTE — Progress Notes (Signed)
   Luis [name] - 73 y.o. male MRN [id number]  Date of birth: 1945-12-18  Office Visit Note: Visit Date: 04/13/2019 PCP: [name], MD Referred by: [name], MD  Subjective: Chief Complaint  Patient presents with  . Right Shoulder - Pain   HPI:  Luis [name] is a 73 y.o. male who comes in today At the request of Dr. Anderson Malta for diagnostic and hopefully therapeutic right glenohumeral joint anesthetic arthrogram.  Patient is intolerant to contrast media with a history of itching.  We did use Omniscan.  He has had 6 months of right shoulder pain worse with movement.  ROS Otherwise per HPI.  Assessment & Plan: Visit Diagnoses:  1. Chronic right shoulder pain     Plan: Findings:  Patient did seem to have relief during anesthetic phase.    Meds & Orders: No orders of the defined types were placed in this encounter.   Orders Placed This Encounter  Procedures  . Large Joint Inj: R glenohumeral  . XR C-ARM NO REPORT    Follow-up: Return if symptoms worsen or fail to improve.   Procedures: Large Joint Inj: R glenohumeral on 04/13/2019 1:10 PM Indications: pain and diagnostic evaluation Details: 22 G 3.5 in needle, anteromedial approach  Arthrogram: Yes  Medications: 80 mg triamcinolone acetonide 40 MG/ML; 4 mL bupivacaine 0.25 %  Arthrogram demonstrated excellent flow of contrast throughout the joint surface without extravasation or obvious defect.  The patient had relief of symptoms during the anesthetic phase of the injection.  Procedure, treatment alternatives, risks and benefits explained, specific risks discussed. Consent was given by the patient. Immediately prior to procedure a time out was called to verify the correct patient, procedure, equipment, support staff and site/side marked as required. Patient was prepped and draped in the usual sterile fashion.      No notes on file   Clinical History: No specialty comments available.     Objective:  VS:   HT:    WT:   BMI:     BP:   HR: bpm  TEMP: ( )  RESP:  Physical Exam  Ortho Exam Imaging: No results found.

## 2019-04-22 ENCOUNTER — Telehealth: Payer: Self-pay

## 2019-04-22 NOTE — Telephone Encounter (Signed)
Pt aware.

## 2019-04-22 NOTE — Telephone Encounter (Signed)
-----   Message from [name], MD sent at 04/22/2019  8:31 AM EDT ----- Regarding: ICD Normal function. No CHF.  JG

## 2019-04-24 DIAGNOSIS — Z95810 Presence of automatic (implantable) cardiac defibrillator: Secondary | ICD-10-CM

## 2019-04-24 DIAGNOSIS — I5022 Chronic systolic (congestive) heart failure: Secondary | ICD-10-CM

## 2019-04-24 DIAGNOSIS — Z4502 Encounter for adjustment and management of automatic implantable cardiac defibrillator: Secondary | ICD-10-CM

## 2019-04-25 ENCOUNTER — Encounter (HOSPITAL_COMMUNITY): Payer: Self-pay

## 2019-04-25 ENCOUNTER — Other Ambulatory Visit: Payer: Self-pay

## 2019-04-25 ENCOUNTER — Ambulatory Visit: Payer: Medicare Other | Admitting: Orthopedic Surgery

## 2019-04-25 ENCOUNTER — Emergency Department (HOSPITAL_COMMUNITY): Payer: Medicare Other

## 2019-04-25 ENCOUNTER — Emergency Department (HOSPITAL_COMMUNITY)
Admission: EM | Admit: 2019-04-25 | Discharge: 2019-04-25 | Disposition: A | Discharge status: Home / Self Care | Payer: Medicare Other | Attending: Emergency Medicine | Admitting: Emergency Medicine

## 2019-04-25 DIAGNOSIS — J189 Pneumonia, unspecified organism: Secondary | ICD-10-CM

## 2019-04-25 DIAGNOSIS — Z7982 Long term (current) use of aspirin: Secondary | ICD-10-CM | POA: Insufficient documentation

## 2019-04-25 DIAGNOSIS — I2510 Atherosclerotic heart disease of native coronary artery without angina pectoris: Secondary | ICD-10-CM | POA: Diagnosis not present

## 2019-04-25 DIAGNOSIS — Z79899 Other long term (current) drug therapy: Secondary | ICD-10-CM | POA: Diagnosis not present

## 2019-04-25 DIAGNOSIS — N182 Chronic kidney disease, stage 2 (mild): Secondary | ICD-10-CM | POA: Diagnosis not present

## 2019-04-25 DIAGNOSIS — J181 Lobar pneumonia, unspecified organism: Secondary | ICD-10-CM | POA: Diagnosis not present

## 2019-04-25 DIAGNOSIS — I5040 Unspecified combined systolic (congestive) and diastolic (congestive) heart failure: Secondary | ICD-10-CM | POA: Diagnosis not present

## 2019-04-25 DIAGNOSIS — Z95810 Presence of automatic (implantable) cardiac defibrillator: Secondary | ICD-10-CM | POA: Insufficient documentation

## 2019-04-25 DIAGNOSIS — Z951 Presence of aortocoronary bypass graft: Secondary | ICD-10-CM | POA: Insufficient documentation

## 2019-04-25 DIAGNOSIS — R509 Fever, unspecified: Secondary | ICD-10-CM | POA: Diagnosis present

## 2019-04-25 LAB — CBC WITH DIFFERENTIAL/PLATELET
Abs Immature Granulocytes: 0.12 10*3/uL — ABNORMAL HIGH (ref 0.00–0.07)
Basophils Absolute: 0.1 10*3/uL (ref 0.0–0.1)
Basophils Relative: 0 %
Eosinophils Absolute: 0.1 10*3/uL (ref 0.0–0.5)
Eosinophils Relative: 1 %
HCT: 43.9 % (ref 39.0–52.0)
Hemoglobin: 14.8 g/dL (ref 13.0–17.0)
Immature Granulocytes: 1 %
Lymphocytes Relative: 8 %
Lymphs Abs: 0.9 10*3/uL (ref 0.7–4.0)
MCH: 31.2 pg (ref 26.0–34.0)
MCHC: 33.7 g/dL (ref 30.0–36.0)
MCV: 92.6 fL (ref 80.0–100.0)
Monocytes Absolute: 1.1 10*3/uL — ABNORMAL HIGH (ref 0.1–1.0)
Monocytes Relative: 9 %
Neutro Abs: 9.7 10*3/uL — ABNORMAL HIGH (ref 1.7–7.7)
Neutrophils Relative %: 81 %
Platelets: 180 10*3/uL (ref 150–400)
RBC: 4.74 MIL/uL (ref 4.22–5.81)
RDW: 15.4 % (ref 11.5–15.5)
WBC: 12 10*3/uL — ABNORMAL HIGH (ref 4.0–10.5)
nRBC: 0 % (ref 0.0–0.2)

## 2019-04-25 LAB — BASIC METABOLIC PANEL
Anion gap: 11 (ref 5–15)
BUN: 20 mg/dL (ref 8–23)
CO2: 17 mmol/L — ABNORMAL LOW (ref 22–32)
Calcium: 8.4 mg/dL — ABNORMAL LOW (ref 8.9–10.3)
Chloride: 104 mmol/L (ref 98–111)
Creatinine, Ser: 1.18 mg/dL (ref 0.61–1.24)
GFR calc Af Amer: >60 mL/min (ref >60)
GFR calc non Af Amer: >60 mL/min (ref >60)
Glucose, Bld: 108 mg/dL — ABNORMAL HIGH (ref 70–99)
Potassium: 5.1 mmol/L (ref 3.5–5.1)
Sodium: 132 mmol/L — ABNORMAL LOW (ref 135–145)

## 2019-04-25 MED ORDER — DOXYCYCLINE HYCLATE 100 MG PO CAPS: 100.0000 mg | ORAL_CAPSULE | Freq: Two times a day (BID) | ORAL | 0 refills | Status: AC

## 2019-04-25 NOTE — ED Notes (Signed)
An After Visit Summary was printed and given to the patient. Discharge instructions given and no further questions at this time. Prescription given to patient

## 2019-04-25 NOTE — ED Triage Notes (Signed)
Patient that he has had a fever, generalized body aches, and cough x 1 week. Patient states he was tested for Covid -19 and was negative 4 days ago.

## 2019-04-25 NOTE — Discharge Instructions (Addendum)
You were seen in the ED today for cough and fever You were found to have pneumonia Please take antibiotics as prescribed Please follow up with your PCP

## 2019-04-25 NOTE — ED Provider Notes (Signed)
[facility] DEPT Provider Note   CSN: [id number] Arrival date & time: 04/25/19  1254    History   Chief Complaint Chief Complaint  Patient presents with  . Fever  . Cough    HPI Luis [name] is a 73 y.o. male with PMHx AICD, CKD,CAD s/p CABG, CHF who presents to the ED today complaining of a gradual onset, constant, productive cough x 6 days. Pt also complains of a fever tmax 101.0. Pt states he was tested for covid 19 and it was negative. He reports he called his PCP today to state he is still having symptoms and feels bad and his PCP told him to come to the ED for further evaluation. Pt denies being exposed to anyone who has tested positive for covid 19. Denies chills, shortness of breath, chest pain, nausea, vomiting, diarrhea, headache, neck stiffness, rash, or any other associated symptoms.        Past Medical History:  Diagnosis Date  . AICD (automatic cardioverter/defibrillator) present 12/15/2017  . Chronic kidney disease    kidney stones; noted per H&P per Dr [name] 02/06/2015 chronic kidney disease stage II  . Dyslipidemia   . History of hepatomegaly    with fatty liver discussed per H&P per Dr [name] 02/06/2015 secondary to abd ultrasound   . History of kidney stones   . Sleep apnea    AHI 17 on sleep study 08/23/2013 pt states was given CPAP machine but sent it back - does not use now 12/15/2017  . Vitamin D deficiency    as noted per H&P per Dr [name] 02/06/2015    Patient Active Problem List   Diagnosis Date Noted  . Ischemic cardiomyopathy 12/15/2017  . ICD: Medtronic  single chamber Visia AF MRI VR [id number] ICD -  12/15/2017  12/15/2017  . Dyspnea 04/16/2017  . Acute combined systolic and diastolic heart failure ([place]) 04/16/2017  . Pneumothorax on right   . Cardiogenic shock ([place]) 03/28/2017  . 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 Acute respiratory failure with hypoxia ([place]) 03/28/2017  . AKI (acute kidney injury) ([place]) 03/28/2017  .  LVAD (left ventricular assist device) present ([place])   . Coronary artery disease 03/25/2017  . Chest pain due to CAD ([place]) 03/24/2017  . 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    Past Surgical History:  Procedure Laterality Date  . ASD REPAIR N/A 03/25/2017   Procedure: ATRIAL SEPTAL DEFECT (ASD) REPAIR;  Surgeon: [name], MD;  Location: [facility];  Service: Open Heart Surgery;  Laterality: N/A;  . BIOPSY  03/10/2019   Procedure: BIOPSY;  Surgeon: Juanita Craver, MD;  [facility];  Service: Endoscopy;;  . CARDIAC DEFIBRILLATOR PLACEMENT  12/15/2017  . COLONOSCOPY WITH PROPOFOL N/A 03/10/2019   Procedure: COLONOSCOPY WITH PROPOFOL;  Surgeon: Juanita Craver, MD;  [facility];  Service: Endoscopy;  Laterality: N/A;  . CORONARY ARTERY BYPASS GRAFT N/A 03/25/2017   Procedure: CORONARY ARTERY BYPASS GRAFTING (CABG) x 2 using left internal mammary artery and right greater saphenous leg vein using endosciope.;  Surgeon: [name], MD;  Location: [facility];  Service: Open Heart Surgery;  Laterality: N/A;  . CYSTOSCOPY W/ RETROGRADES Left 03/26/2015   Procedure: CYSTOSCOPY WITH RETROGRADE PYELOGRAM;  Surgeon: [name], MD;  [facility];  Service: Urology;  Laterality: Left;  . ICD IMPLANT N/A 12/15/2017   Procedure: ICD IMPLANT;  Surgeon: [name], MD;  Location: [facility];  Service: Cardiovascular;  Laterality: N/A;  .  LEFT HEART CATH AND CORONARY ANGIOGRAPHY N/A 03/25/2017   Procedure: Left Heart Cath and Coronary Angiography;  Surgeon: [name], MD;  Location: [facility];  Service: Cardiovascular;  Laterality: N/A;  . PLACEMENT OF IMPELLA LEFT VENTRICULAR ASSIST DEVICE  03/25/2017   Procedure: PLACEMENT OF IMPELLA LEFT VENTRICULAR ASSIST DEVICE;  Surgeon: [name], MD;  Location: [facility];  Service: Open Heart Surgery;;  . REMOVAL OF IMPELLA LEFT VENTRICULAR ASSIST DEVICE N/A 03/31/2017   Procedure: REMOVAL OF IMPELLA LEFT  VENTRICULAR ASSIST DEVICE;  Surgeon: [name], MD;  Location: [facility];  Service: Open Heart Surgery;  Laterality: N/A;  . ROBOT ASSISTED PYELOPLASTY Left 03/26/2015   Procedure: ROBOTIC ASSISTED PYELOPLASTY WITH STENT PLACEMENT, REMOVAL OF STONE AND CYST DECORTICATION;  Surgeon: [name], MD;  [facility];  Service: Urology;  Laterality: Left;  . TEE WITHOUT CARDIOVERSION N/A 03/25/2017   Procedure: TRANSESOPHAGEAL ECHOCARDIOGRAM (TEE);  Surgeon: [name], [name], MD;  Location: [facility];  Service: Open Heart Surgery;  Laterality: N/A;  . TEE WITHOUT CARDIOVERSION N/A 03/31/2017   Procedure: TRANSESOPHAGEAL ECHOCARDIOGRAM (TEE);  Surgeon: [name], [name], MD;  Location: [facility];  Service: Open Heart Surgery;  Laterality: N/A;        Home Medications    Prior to Admission medications   Medication Sig Start Date End Date Taking? Authorizing Provider  aspirin EC 81 MG tablet Take 81 mg by mouth daily.   Yes [provider]  carvedilol (COREG) 3.125 MG tablet Take 1 tablet (3.125 mg total) by mouth 2 (two) times daily. 03/26/19 03/20/20 Yes [name], MD  cholecalciferol (VITAMIN D) 1000 units tablet Take 1,000 Units by mouth daily.   Yes [provider]  Coenzyme Q10 (COQ10) 200 MG CAPS Take 200 mg by mouth daily.   Yes [provider]  Evolocumab (REPATHA SURECLICK) 240 MG/ML SOAJ Inject 140 mg into the skin every 14 (fourteen) days. 04/11/19  Yes [name], MD  sacubitril-valsartan (ENTRESTO) 24-26 MG Take 1 tablet by mouth 2 (two) times daily. 03/24/19  Yes [name], MD  vitamin B-12 (CYANOCOBALAMIN) 1000 MCG tablet Take 1,000 mcg by mouth daily.   Yes [provider]  doxycycline (VIBRAMYCIN) 100 MG capsule Take 1 capsule (100 mg total) by mouth 2 (two) times daily for 7 days. 04/25/19 05/02/19  Eustaquio Maize, PA-C    Family History Family History  Problem Relation Age of Onset  . Healthy Mother   . Healthy Father   . Healthy Sister   .  Healthy Brother     Social History Social History   Tobacco Use  . Smoking status: Never Smoker  . Smokeless tobacco: Never Used  Substance Use Topics  . Alcohol use: No  . Drug use: No     Allergies   Contrast media [iodinated diagnostic agents] and Crestor [rosuvastatin calcium]   Review of Systems Review of Systems  Constitutional: Positive for fever. Negative for chills.  HENT: Negative for congestion, ear pain, sinus pressure, sinus pain and sore throat.   Eyes: Negative for visual disturbance.  Respiratory: Positive for cough. Negative for shortness of breath.   Cardiovascular: Negative for chest pain.  Gastrointestinal: Negative for abdominal pain, diarrhea, nausea and vomiting.  Genitourinary: Negative for dysuria and frequency.  Musculoskeletal: Negative for myalgias.  Skin: Negative for rash.  Neurological: Negative for headaches.     Physical Exam Updated Vital Signs BP 105/75 (BP Location: Left Arm)   Pulse 92   Temp  98.7 F (37.1 C) (Oral)   Resp (!) 22   Ht 5\' 7"  (1.702 m)   Wt 83 kg   SpO2 97%   BMI 28.66 kg/m   Physical Exam Vitals signs and nursing note reviewed.  Constitutional:      Appearance: He is not ill-appearing or toxic-appearing.  HENT:     Head: Normocephalic and atraumatic.     Right Ear: Tympanic membrane normal.     Left Ear: Tympanic membrane normal.     Nose: Nose normal.  Eyes:     Conjunctiva/sclera: Conjunctivae normal.     Pupils: Pupils are equal, round, and reactive to light.  Neck:     Musculoskeletal: Neck supple.  Cardiovascular:     Rate and Rhythm: Normal rate and regular rhythm.     Pulses: Normal pulses.  Pulmonary:     Effort: Pulmonary effort is normal.     Breath sounds: Normal breath sounds. No wheezing, rhonchi or rales.  Abdominal:     Palpations: Abdomen is soft.     Tenderness: There is no abdominal tenderness. There is no guarding or rebound.  Lymphadenopathy:     Cervical: No cervical  adenopathy.  Skin:    General: Skin is warm and dry.  Neurological:     Mental Status: He is alert.      ED Treatments / Results  Labs (all labs ordered are listed, but only abnormal results are displayed) Labs Reviewed  BASIC METABOLIC PANEL - Abnormal; Notable for the following components:      Result Value   Sodium 132 (*)    CO2 17 (*)    Glucose, Bld 108 (*)    Calcium 8.4 (*)    All other components within normal limits  CBC WITH DIFFERENTIAL/PLATELET - Abnormal; Notable for the following components:   WBC 12.0 (*)    Neutro Abs 9.7 (*)    Monocytes Absolute 1.1 (*)    Abs Immature Granulocytes 0.12 (*)    All other components within normal limits    EKG None  Radiology Dg Chest Port 1 View  Result Date: 04/25/2019 CLINICAL DATA:  Cough and fever EXAM: PORTABLE CHEST 1 VIEW COMPARISON:  12/16/2017 FINDINGS: New density in the left lung base consistent with pneumonia. Minimal left effusion Right lung clear. AICD remains in good position. Negative for heart failure. IMPRESSION: New airspace disease in the left lung base compatible with pneumonia. Electronically Signed   By: Franchot Gallo M.D.   On: 04/25/2019 17:55    Procedures Procedures (including critical care time)  Medications Ordered in ED Medications - No data to display   Initial Impression / Assessment and Plan / ED Course  I have reviewed the triage vital signs and the nursing notes.  Pertinent labs & imaging results that were available during my care of the patient were reviewed by me and considered in my medical decision making (see chart for details).  Clinical Course as of Apr 26 47  Mon Apr 25, 2019  1801 PNA in left lung base  DG Chest [place] 1 View [MV]  1919 At baseline  Creatinine: 1.18 [MV]    Clinical Course User Index [MV] Eustaquio Maize, [place]   73 year old male presenting to the ED with complains of fever and cough. Tested negative for covid 19 last week. Still having symptoms.  Pt afebrile in the ED today without tachycardia or tachypnea. Satting 99% on RA. Denies shortness of breath. Nursing staff visualized pt ambulating from  the waiting room without difficulty. Considering patient has already tested negative for covid suspect he has a URI instead. Will obtain CXR today given pt was sent by PCP for "further eval." Do not feel he needs bloodwork at this time but if CXR shows anything concerning including PNA may reconsider. Pt will likely be dispo'd home with instructions to take Tylenol PRN for fever.   CXR positive for PNA. Pt does have a mild leukocytosis of 12.0. No other electrolyte deranagements. Pt has been satting above 97% on RA. Feel he is stable for discharge home with oral abx and close PCP follow up. He is in agreement with plan and stable for discharge home.        Final Clinical Impressions(s) / ED Diagnoses   Final diagnoses:  Community acquired pneumonia of left lower lobe of lung [facility])    ED Discharge Orders         Ordered    doxycycline (VIBRAMYCIN) 100 MG capsule  2 times daily     04/25/19 [place], Ahmar Pickrell, PA-C 04/26/19 0048    Hayden Rasmussen, MD 04/26/19 1321

## 2019-04-27 ENCOUNTER — Ambulatory Visit: Payer: Medicare Other | Admitting: Physical Medicine and Rehabilitation

## 2019-05-17 MED ORDER — BUPIVACAINE HCL 0.25 % IJ SOLN
4.0000 mL | INTRAMUSCULAR | Status: AC | PRN
  Administered 2019-04-13: 4 mL via INTRA_ARTICULAR

## 2019-05-17 MED ORDER — TRIAMCINOLONE ACETONIDE 40 MG/ML IJ SUSP
80.0000 mg | INTRAMUSCULAR | Status: AC | PRN
  Administered 2019-04-13: 80 mg via INTRA_ARTICULAR

## 2019-05-20 ENCOUNTER — Encounter: Payer: Self-pay | Admitting: Cardiology

## 2019-05-20 DIAGNOSIS — I5042 Chronic combined systolic (congestive) and diastolic (congestive) heart failure: Secondary | ICD-10-CM

## 2019-05-20 DIAGNOSIS — Z4502 Encounter for adjustment and management of automatic implantable cardiac defibrillator: Secondary | ICD-10-CM

## 2019-05-20 HISTORY — DX: Encounter for adjustment and management of automatic implantable cardiac defibrillator: Z45.02

## 2019-05-20 HISTORY — DX: Chronic combined systolic (congestive) and diastolic (congestive) heart failure: I50.42

## 2019-05-25 DIAGNOSIS — Z4502 Encounter for adjustment and management of automatic implantable cardiac defibrillator: Secondary | ICD-10-CM

## 2019-05-25 DIAGNOSIS — Z95810 Presence of automatic (implantable) cardiac defibrillator: Secondary | ICD-10-CM

## 2019-05-25 DIAGNOSIS — I509 Heart failure, unspecified: Secondary | ICD-10-CM | POA: Diagnosis not present

## 2019-06-23 ENCOUNTER — Telehealth: Payer: Self-pay

## 2019-06-23 NOTE — Telephone Encounter (Signed)
Pt is aware and will call us if he needs [place]

## 2019-06-23 NOTE — Telephone Encounter (Signed)
-----   Message from [name], MD sent at 06/22/2019  6:47 AM EDT ----- Regarding: ICD Remote ICD check  06/14/2019:    No VHR episodes, no mode switches. OptiVol Fluid Index do no present significant abnormalities. Battery longevity is 9.6-11.3 years. RV pacing is <0.1 %.  Normal function, no fluid accumulation

## 2019-06-25 DIAGNOSIS — Z95818 Presence of other cardiac implants and grafts: Secondary | ICD-10-CM

## 2019-06-25 DIAGNOSIS — I5022 Chronic systolic (congestive) heart failure: Secondary | ICD-10-CM

## 2019-06-25 DIAGNOSIS — Z4502 Encounter for adjustment and management of automatic implantable cardiac defibrillator: Secondary | ICD-10-CM | POA: Diagnosis not present

## 2019-06-29 ENCOUNTER — Ambulatory Visit: Payer: Self-pay

## 2019-06-29 ENCOUNTER — Ambulatory Visit (INDEPENDENT_AMBULATORY_CARE_PROVIDER_SITE_OTHER): Payer: Medicare Other | Admitting: Orthopedic Surgery

## 2019-06-29 ENCOUNTER — Encounter: Payer: Self-pay | Admitting: Orthopedic Surgery

## 2019-06-29 ENCOUNTER — Other Ambulatory Visit: Payer: Self-pay

## 2019-06-29 DIAGNOSIS — M79605 Pain in left leg: Secondary | ICD-10-CM | POA: Diagnosis not present

## 2019-06-29 DIAGNOSIS — M545 Low back pain: Secondary | ICD-10-CM

## 2019-06-29 DIAGNOSIS — M79604 Pain in right leg: Secondary | ICD-10-CM

## 2019-07-01 ENCOUNTER — Encounter: Payer: Self-pay | Admitting: Orthopedic Surgery

## 2019-07-01 NOTE — Progress Notes (Signed)
Office Visit Note   Patient: Luis [name]           Date of Birth: 12/25/1945           MRN: [id number] Visit Date: 06/29/2019 Requested by: [name], MD [id number] [place],  [place] [address] PCP: [name], MD  Subjective: Chief Complaint  Patient presents with  . Right Shoulder - Pain  . Lower Back - Pain    HPI: Patient presents with complaints of back pain and bilateral knee pain right equal to left.  His shoulder is doing well.  He has been doing some exercises on a daily basis for known right frozen shoulder.  Regarding his back he describes chronic pain which comes and goes.  Denies any radicular pain no numbness and tingling.  Just reports primarily sacroiliac joint region bilateral back pain.  No fevers chills weight loss or weakness in the legs.  Never had physical therapy and does not want to really take any medication for the problem.  Patient also describes bilateral knee pain right worse than left.  He is tried heating pad and BenGay for this problem.  Denies much in the way of mechanical symptoms.  No prior surgeries on the knees.              ROS: All systems reviewed are negative as they relate to the chief complaint within the history of present illness.  Patient denies  fevers or chills.   Assessment & Plan: Visit Diagnoses:  1. Bilateral leg pain     Plan: Impression is bilateral knee pain with no effusion and normal exam and radiographs.  Could be degenerative changes which are not present radiographically or could be just some mild inflammation in the knees.  Discussed injection today.  He wants to try physical therapy for strengthening which I think is a good for step.  Regarding the back pain he does not have any radicular symptoms or any red flag symptoms.  I think therapy for that for what would likely be facet arthritis is indicated.  I will see him back as needed.  Follow-Up Instructions: Return if symptoms worsen or fail to improve.   Orders:   Orders Placed This Encounter  Procedures  . XR Lumbar Spine 2-3 Views  . XR Knee 1-2 Views Right  . XR Knee 1-2 Views Left   No orders of the defined types were placed in this encounter.     Procedures: No procedures performed   Clinical Data: No additional findings.  Objective: Vital Signs: There were no vitals taken for this visit.  Physical Exam:   Constitutional: Patient appears well-developed HEENT:  Head: Normocephalic Eyes:EOM are normal Neck: Normal range of motion Cardiovascular: Normal rate Pulmonary/chest: Effort normal Neurologic: Patient is alert Skin: Skin is warm Psychiatric: Patient has normal mood and affect    Ortho Exam: Ortho exam demonstrates normal gait alignment.  No effusion in either knee.  Collateral and cruciate ligaments are stable with intact extensor mechanism in both knees.  No groin pain with internal X rotation of either leg.  No paresthesias L1 S1 bilaterally.  Negative Babinski negative clonus symmetric reflexes 1+ out of 4 bilateral patella and Achilles.  Specialty Comments:  No specialty comments available.  Imaging: No results found.   PMFS History: Patient Active Problem List   Diagnosis Date Noted  . Chronic combined systolic and diastolic heart failure ([place]) 05/20/2019  . Encounter for assessment of implantable cardioverter-defibrillator (ICD) 05/20/2019  . Ischemic  cardiomyopathy 12/15/2017  . ICD: Medtronic  single chamber Visia AF MRI VR [id number] ICD -  12/15/2017  12/15/2017  . Dyspnea 04/16/2017  . Acute combined systolic and diastolic heart failure ([place]) 04/16/2017  . 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 Acute respiratory failure with hypoxia ([place]) 03/28/2017  . Coronary artery disease 03/25/2017   Past Medical History:  Diagnosis Date  . AICD (automatic cardioverter/defibrillator) present 12/15/2017  . AKI (acute kidney injury) ([place]) 03/28/2017  . Cardiogenic shock ([place]) 03/28/2017  . Chronic combined systolic and  diastolic heart failure ([place]) 05/20/2019  . Chronic kidney disease    kidney stones; noted per H&P per Dr [name] 02/06/2015 chronic kidney disease stage II  . Dyslipidemia   . Encounter for assessment of implantable cardioverter-defibrillator (ICD) 05/20/2019  . History of hepatomegaly    with fatty liver discussed per H&P per Dr [name] 02/06/2015 secondary to abd ultrasound   . History of kidney stones   . LVAD (left ventricular assist device) present ([place])   . Pneumothorax on right   . Sleep apnea    AHI 17 on sleep study 08/23/2013 pt states was given CPAP machine but sent it back - does not use now 12/15/2017  . 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  . Vitamin D deficiency    as noted per H&P per Dr [name] 02/06/2015    Family History  Problem Relation Age of Onset  . Healthy Mother   . Healthy Father   . Healthy Sister   . Healthy Brother     Past Surgical History:  Procedure Laterality Date  . ASD REPAIR N/A 03/25/2017   Procedure: ATRIAL SEPTAL DEFECT (ASD) REPAIR;  Surgeon: [name], MD;  Location: [facility];  Service: Open Heart Surgery;  Laterality: N/A;  . BIOPSY  03/10/2019   Procedure: BIOPSY;  Surgeon: Juanita Craver, MD;  [facility];  Service: Endoscopy;;  . CARDIAC DEFIBRILLATOR PLACEMENT  12/15/2017  . COLONOSCOPY WITH PROPOFOL N/A 03/10/2019   Procedure: COLONOSCOPY WITH PROPOFOL;  Surgeon: Juanita Craver, MD;  [facility];  Service: Endoscopy;  Laterality: N/A;  . CORONARY ARTERY BYPASS GRAFT N/A 03/25/2017   Procedure: CORONARY ARTERY BYPASS GRAFTING (CABG) x 2 using left internal mammary artery and right greater saphenous leg vein using endosciope.;  Surgeon: [name], MD;  Location: [facility];  Service: Open Heart Surgery;  Laterality: N/A;  . CYSTOSCOPY W/ RETROGRADES Left 03/26/2015   Procedure: CYSTOSCOPY WITH RETROGRADE PYELOGRAM;  Surgeon: [name], MD;  [facility];  Service: Urology;  Laterality: Left;  . ICD  IMPLANT N/A 12/15/2017   Procedure: ICD IMPLANT;  Surgeon: [name], MD;  Location: [facility];  Service: Cardiovascular;  Laterality: N/A;  . LEFT HEART CATH AND CORONARY ANGIOGRAPHY N/A 03/25/2017   Procedure: Left Heart Cath and Coronary Angiography;  Surgeon: [name], MD;  Location: [facility];  Service: Cardiovascular;  Laterality: N/A;  . PLACEMENT OF IMPELLA LEFT VENTRICULAR ASSIST DEVICE  03/25/2017   Procedure: PLACEMENT OF IMPELLA LEFT VENTRICULAR ASSIST DEVICE;  Surgeon: [name], MD;  Location: [facility];  Service: Open Heart Surgery;;  . REMOVAL OF IMPELLA LEFT VENTRICULAR ASSIST DEVICE N/A 03/31/2017   Procedure: REMOVAL OF IMPELLA LEFT VENTRICULAR ASSIST DEVICE;  Surgeon: [name], MD;  Location: [facility];  Service: Open Heart Surgery;  Laterality: N/A;  . ROBOT ASSISTED PYELOPLASTY Left 03/26/2015   Procedure: ROBOTIC ASSISTED PYELOPLASTY WITH STENT PLACEMENT, REMOVAL OF STONE AND  CYST DECORTICATION;  Surgeon: [name], MD;  [facility];  Service: Urology;  Laterality: Left;  . TEE WITHOUT CARDIOVERSION N/A 03/25/2017   Procedure: TRANSESOPHAGEAL ECHOCARDIOGRAM (TEE);  Surgeon: [name], [name], MD;  Location: [facility];  Service: Open Heart Surgery;  Laterality: N/A;  . TEE WITHOUT CARDIOVERSION N/A 03/31/2017   Procedure: TRANSESOPHAGEAL ECHOCARDIOGRAM (TEE);  Surgeon: [name], [name], MD;  Location: [facility];  Service: Open Heart Surgery;  Laterality: N/A;   Social History   Occupational History  . Not on file  Tobacco Use  . Smoking status: Never Smoker  . Smokeless tobacco: Never Used  Substance and Sexual Activity  . Alcohol use: No  . Drug use: No  . Sexual activity: Not on file

## 2019-07-13 ENCOUNTER — Encounter: Payer: Self-pay | Admitting: Cardiology

## 2019-07-13 ENCOUNTER — Other Ambulatory Visit: Payer: Self-pay

## 2019-07-13 ENCOUNTER — Ambulatory Visit (INDEPENDENT_AMBULATORY_CARE_PROVIDER_SITE_OTHER): Payer: Medicare Other | Admitting: Cardiology

## 2019-07-13 VITALS — BP 109/74 | HR 78 | Ht 68.0 in | Wt 180.0 lb

## 2019-07-13 DIAGNOSIS — I255 Ischemic cardiomyopathy: Secondary | ICD-10-CM | POA: Diagnosis not present

## 2019-07-13 DIAGNOSIS — I5022 Chronic systolic (congestive) heart failure: Secondary | ICD-10-CM | POA: Diagnosis not present

## 2019-07-13 DIAGNOSIS — I2510 Atherosclerotic heart disease of native coronary artery without angina pectoris: Secondary | ICD-10-CM

## 2019-07-13 DIAGNOSIS — I25118 Atherosclerotic heart disease of native coronary artery with other forms of angina pectoris: Secondary | ICD-10-CM | POA: Diagnosis not present

## 2019-07-13 DIAGNOSIS — E7800 Pure hypercholesterolemia, unspecified: Secondary | ICD-10-CM | POA: Diagnosis not present

## 2019-07-13 DIAGNOSIS — E782 Mixed hyperlipidemia: Secondary | ICD-10-CM

## 2019-07-13 DIAGNOSIS — Z95810 Presence of automatic (implantable) cardiac defibrillator: Secondary | ICD-10-CM

## 2019-07-13 MED ORDER — EVOLOCUMAB 140 MG/ML ~~LOC~~ SOAJ: 140.0000 mg | Freq: Once | SUBCUTANEOUS | Status: DC

## 2019-07-13 MED ORDER — EVOLOCUMAB 140 MG/ML ~~LOC~~ SOSY
140.0000 mg | PREFILLED_SYRINGE | Freq: Once | SUBCUTANEOUS | Status: AC
  Administered 2019-07-13: 140 mg via SUBCUTANEOUS

## 2019-07-13 MED ORDER — NITROGLYCERIN 0.4 MG SL SUBL: 0.4000 mg | SUBLINGUAL_TABLET | SUBLINGUAL | 4 refills | Status: DC | PRN

## 2019-07-13 MED ORDER — REPATHA SURECLICK 140 MG/ML ~~LOC~~ SOAJ: 140.0000 mg | SUBCUTANEOUS | 3 refills | Status: DC

## 2019-07-13 MED ORDER — ENTRESTO 24-26 MG PO TABS: 1.0000 | ORAL_TABLET | Freq: Two times a day (BID) | ORAL | 3 refills | Status: DC

## 2019-07-13 NOTE — Progress Notes (Signed)
Primary Physician/Referring:  [name], MD  Patient ID: Luis [name], male    DOB: 1945-12-23, 73 y.o.   MRN: [id number]  Chief Complaint  Patient presents with  . Chest Pain  . Follow-up    HPI: Luis [name]  is a 73 y.o. male  with coronary artery disease with history of emergent CABG with LIMA to LAD and SVG to OM by Tharon Aquas Tright. His past medical history includes hyperlipidemia. Underwent single chamber Medtronic ICD implantation on 12/15/2017 for primary prevention in view of persistent low LVEF.   Patient had called yesterday stating that he was having chest tightness, this lasted for about 10-15 minutes, hypertension nitroglycerin with relief after several minutes.  This morning he is feeling better.  This is the 1st time that has had angina since his CABG.  This morning he feels tired but not having chest pain. He stopped Repatha a couple months ago. No dyspnea, leg edema, dizziness or syncope.   Past Medical History:  Diagnosis Date  . AICD (automatic cardioverter/defibrillator) present 12/15/2017  . AKI (acute kidney injury) ([place]) 03/28/2017  . Cardiogenic shock ([place]) 03/28/2017  . Chronic combined systolic and diastolic heart failure ([place]) 05/20/2019  . Chronic kidney disease    kidney stones; noted per H&P per Dr [name] 02/06/2015 chronic kidney disease stage II  . Dyslipidemia   . Encounter for assessment of implantable cardioverter-defibrillator (ICD) 05/20/2019  . History of hepatomegaly    with fatty liver discussed per H&P per Dr [name] 02/06/2015 secondary to abd ultrasound   . History of kidney stones   . LVAD (left ventricular assist device) present ([place])   . Pneumothorax on right   . Sleep apnea    AHI 17 on sleep study 08/23/2013 pt states was given CPAP machine but sent it back - does not use now 12/15/2017  . 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  . Vitamin D deficiency    as noted per H&P per Dr [name] 02/06/2015    Past Surgical  History:  Procedure Laterality Date  . ASD REPAIR N/A 03/25/2017   Procedure: ATRIAL SEPTAL DEFECT (ASD) REPAIR;  Surgeon: [name], MD;  Location: [facility];  Service: Open Heart Surgery;  Laterality: N/A;  . BIOPSY  03/10/2019   Procedure: BIOPSY;  Surgeon: Juanita Craver, MD;  [facility];  Service: Endoscopy;;  . CARDIAC DEFIBRILLATOR PLACEMENT  12/15/2017  . COLONOSCOPY WITH PROPOFOL N/A 03/10/2019   Procedure: COLONOSCOPY WITH PROPOFOL;  Surgeon: Juanita Craver, MD;  [facility];  Service: Endoscopy;  Laterality: N/A;  . CORONARY ARTERY BYPASS GRAFT N/A 03/25/2017   Procedure: CORONARY ARTERY BYPASS GRAFTING (CABG) x 2 using left internal mammary artery and right greater saphenous leg vein using endosciope.;  Surgeon: [name], MD;  Location: [facility];  Service: Open Heart Surgery;  Laterality: N/A;  . CYSTOSCOPY W/ RETROGRADES Left 03/26/2015   Procedure: CYSTOSCOPY WITH RETROGRADE PYELOGRAM;  Surgeon: [name], MD;  [facility];  Service: Urology;  Laterality: Left;  . ICD IMPLANT N/A 12/15/2017   Procedure: ICD IMPLANT;  Surgeon: [name], MD;  Location: [facility];  Service: Cardiovascular;  Laterality: N/A;  . LEFT HEART CATH AND CORONARY ANGIOGRAPHY N/A 03/25/2017   Procedure: Left Heart Cath and Coronary Angiography;  Surgeon: [name], MD;  Location: [facility];  Service: Cardiovascular;  Laterality: N/A;  . PLACEMENT OF [place] LEFT VENTRICULAR ASSIST DEVICE  03/25/2017   Procedure: PLACEMENT OF IMPELLA LEFT  VENTRICULAR ASSIST DEVICE;  Surgeon: [name], MD;  Location: [facility];  Service: Open Heart Surgery;;  . REMOVAL OF IMPELLA LEFT VENTRICULAR ASSIST DEVICE N/A 03/31/2017   Procedure: REMOVAL OF IMPELLA LEFT VENTRICULAR ASSIST DEVICE;  Surgeon: [name], MD;  Location: [facility];  Service: Open Heart Surgery;  Laterality: N/A;  . ROBOT ASSISTED PYELOPLASTY Left 03/26/2015   Procedure: ROBOTIC ASSISTED  PYELOPLASTY WITH STENT PLACEMENT, REMOVAL OF STONE AND CYST DECORTICATION;  Surgeon: [name], MD;  [facility];  Service: Urology;  Laterality: Left;  . TEE WITHOUT CARDIOVERSION N/A 03/25/2017   Procedure: TRANSESOPHAGEAL ECHOCARDIOGRAM (TEE);  Surgeon: [name], [name], MD;  Location: [facility];  Service: Open Heart Surgery;  Laterality: N/A;  . TEE WITHOUT CARDIOVERSION N/A 03/31/2017   Procedure: TRANSESOPHAGEAL ECHOCARDIOGRAM (TEE);  Surgeon: [name], [name], MD;  Location: [facility];  Service: Open Heart Surgery;  Laterality: N/A;    Social History   Socioeconomic History  . Marital status: Married    Spouse name: Not on file  . Number of children: 3  . Years of education: Not on file  . Highest education level: Not on file  Occupational History  . Not on file  Social Needs  . Financial resource strain: Not on file  . Food insecurity    Worry: Not on file    Inability: Not on file  . Transportation needs    Medical: Not on file    Non-medical: Not on file  Tobacco Use  . Smoking status: Never Smoker  . Smokeless tobacco: Never Used  Substance and Sexual Activity  . Alcohol use: No  . Drug use: No  . Sexual activity: Not on file  Lifestyle  . Physical activity    Days per week: Not on file    Minutes per session: Not on file  . Stress: Not on file  Relationships  . Social Herbalist on phone: Not on file    Gets together: Not on file    Attends religious service: Not on file    Active member of club or organization: Not on file    Attends meetings of clubs or organizations: Not on file    Relationship status: Not on file  . Intimate partner violence    Fear of current or ex partner: Not on file    Emotionally abused: Not on file    Physically abused: Not on file    Forced sexual activity: Not on file  Other Topics Concern  . Not on file  Social History Narrative  . Not on file   Review of Systems  Constitution: Negative for chills, decreased  appetite, malaise/fatigue and weight gain.  Cardiovascular: Positive for chest pain. Negative for dyspnea on exertion, leg swelling and syncope.  Endocrine: Negative for cold intolerance.  Hematologic/Lymphatic: Does not bruise/bleed easily.  Musculoskeletal: Negative for joint swelling.  Gastrointestinal: Negative for abdominal pain, anorexia, change in bowel habit, hematochezia and melena.  Neurological: Negative for headaches and light-headedness.  Psychiatric/Behavioral: Negative for depression and substance abuse.  All other systems reviewed and are negative.  Objective  Blood pressure 109/74, pulse 78, height _0  (1.727 m), weight 180 lb (81.6 kg), SpO2 95 %. Body mass index is 27.37 kg/m.    Physical Exam  Constitutional: He appears well-developed and well-nourished. No distress.  HENT:  Head: Atraumatic.  Eyes: Conjunctivae are normal.  Neck: Neck supple. No JVD present. No thyromegaly present.  Cardiovascular: Normal rate, regular  rhythm, normal heart sounds and intact distal pulses. Exam reveals no gallop.  No murmur heard. Pulmonary/Chest: Effort normal and breath sounds normal.  Pacemaker/ICD site noted  in the left infraclavicular fossa.   Abdominal: Soft. Bowel sounds are normal.  Musculoskeletal: Normal range of motion.  Neurological: He is alert.  Skin: Skin is warm and dry.  Psychiatric: He has a normal mood and affect.   Radiology: No results found.  Laboratory examination:   Labs 05/03/2019: Potassium 5.2, BUN 14, creatinine 0.9, eGFR greater than 60 him up, CMP otherwise normal.  A1c 6.1%.  Labs 03/17/2019: Total cholesterol 114, triglycerides 180, HDL 41, LDL 37.  Non-HDL cholesterol 73.  HB 15.1/HCT 44.8, platelets 291.  Potassium 5.4, BUN 12, creatinine 1.1, eGFR 63/73 ML.  A1c 6.1%.  CMP Latest Ref Rng & Units 04/25/2019 12/10/2017 04/17/2017  Glucose 70 - 99 mg/dL 108(H) 104(H) 93  BUN 8 - 23 mg/dL _0 Creatinine 0.61 - 1.24 mg/dL 1.18 1.00 1.19   Sodium 135 - 145 mmol/L 132(L) 140 134(L)  Potassium 3.5 - 5.1 mmol/L 5.1 5.1 4.3  Chloride 98 - 111 mmol/L 104 104 101  CO2 22 - 32 mmol/L 17(L) 23 24  Calcium 8.9 - 10.3 mg/dL 8.4(L) 9.4 8.5(L)  Total Protein 6.5 - 8.1 g/dL - - -  Total Bilirubin 0.3 - 1.2 mg/dL - - -  Alkaline Phos 38 - 126 U/L - - -  AST 15 - 41 U/L - - -  ALT 17 - 63 U/L - - -   CBC Latest Ref Rng & Units 04/25/2019 12/10/2017 04/16/2017  WBC 4.0 - 10.5 K/uL 12.0(H) 6.5 7.7  Hemoglobin 13.0 - 17.0 g/dL 14.8 13.8 9.9(L)  Hematocrit 39.0 - 52.0 % 43.9 40.5 30.8(L)  Platelets 150 - 400 K/uL 180 243 414(H)   Lipid Panel  No results found for: CHOL, TRIG, HDL, CHOLHDL, VLDL, LDLCALC, LDLDIRECT HEMOGLOBIN A1C Lab Results  Component Value Date   HGBA1C 6.1 (H) 03/26/2017   MPG 128 03/26/2017   TSH No results for input(s): TSH in the last 8760 hours.  Medications   Current Outpatient Medications  Medication Instructions  . aspirin EC 81 mg, Oral, Daily  . carvedilol (COREG) 3.125 mg, Oral, 2 times daily  . cholecalciferol (VITAMIN D) 1,000 Units, Oral, Daily  . CoQ10 200 mg, Oral, Daily  . nitroGLYCERIN (NITROSTAT) 0.4 mg, Sublingual, Every 5 min PRN  . Repatha SureClick 162 mg, Subcutaneous, Every 14 days  . sacubitril-valsartan (ENTRESTO) 24-26 MG 1 tablet, Oral, 2 times daily  . vitamin B-12 (CYANOCOBALAMIN) 1,000 mcg, Oral, Daily   Cardiac Studies:   Coronary angiogram 03/25/2017: Mild decrease in LV systolic function, EF 44% with inferior, inferoapical severe hypokinesis. LAD CTO midsegment just after origin of a large D1, short segment occlusion with type II collaterals from the RCA. Bridging Collaterals also evident. Mid circumflex very large with a 75% stenosis. Large OM1. Mild disease. RCA mild diffuse disease, large vessel. Type II collaterals to the LAD. Distal small PL secondary branch is occluded and has collaterals from left to right.   Interventional data: Unsuccessful attempt at PTCA LAD,  left main dissection occurred, patient had to be emergently transferred to the operating room. Patient's family members including wife and son were met with, Dr. Dahlia Byes evaluated the patient in the Cath Lab. 150 mL contrast utilized.  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  Echocardiogram 05/25/2018: Left ventricle cavity is normal in size. Mild  concentric hypertrophy of the left ventricle. LAD territory akinesis. Doppler evidence of grade I (impaired) diastolic dysfunction, normal LAP. LVEF 25-30%. Left atrial cavity is mildly dilated. No significant valvular abnormality. Inadequate tricuspid regurgitation jet to estimate pulmonary artery pressure. Normal right atrial pressure.  Assessment     ICD-10-CM   1. Coronary artery disease of native artery of native heart with stable angina pectoris ([place])  I25.118 EKG 12-Lead  2. ICD: Medtronic  single chamber Visia AF MRI VR [id number] ICD -  12/15/2017   Z95.810   3. Ischemic cardiomyopathy  I25.5   4. Chronic systolic heart failure ([facility])  I50.22     EKG 07/13/2019: Normal sinus rhythm at rate of 77 bpm, normal axis.  Poor progression, cannot exclude anteroseptal infarct old.  Nonspecific T abnormality. No significant change from  EKG 10/14/2018.  Remote ICD check  06/14/2019:    No VHR episodes, no mode switches. OptiVol Fluid Index do no present significant abnormalities. Battery longevity is 9.6-11.3 years. RV pacing is <0.1 %.  Clinic ICD check 04/11/2019: Presently sinus rhythm at 80 bpm.  No VHR or AHR episodes.  Lead health is normal.  No therapy.  OptiVol although little baseline, is slightly trending up.  Longevity >10 years.  Normal function.  Recommendations:   Patient had called yesterday stating that he was having chest tightness, this lasted for about 10-15 minutes, hypertension nitroglycerin with relief after several minutes.  This morning he is feeling better.  This is the 1st time that has  had angina since his CABG.  I have recommended that we repeat a stress test and also repeat echocardiogram.  I reviewed his data from the ICD, fortunately no recent acute decompensated heart failure.  Today the EKG does not reveal any acute abnormality.  He is extremely high risk.  He had discontinued taking Repatha, advised him that he should be on this forever, unless he is willing to try the statins again and he had developed severe myalgias and fatigue with statins in the past.  He is willing to restart Repatha.  Otherwise he is on appropriate medical therapy, he has very low borderline blood pressure, hence I'll not been able to titrate his Entresto any higher than what it is today.  He is also on a very low-dose of beta blocker therapy.  Continue the same.  I also reviewed his recent labs from the PCP.  CBC and BMP has remained stable.  Office visit in 6 months unless stress test is abnormal or if he continues to have frequent episodes of angina pectoris.  Nitroglycerin was refilled.  [name], MD, [facility] 07/13/2019, 2:54 PM [facility] [place] Pager: [phone] Office: [phone] If no answer Cell [phone]

## 2019-07-20 ENCOUNTER — Other Ambulatory Visit: Payer: Self-pay

## 2019-07-20 ENCOUNTER — Ambulatory Visit (INDEPENDENT_AMBULATORY_CARE_PROVIDER_SITE_OTHER): Payer: Medicare Other

## 2019-07-20 DIAGNOSIS — I5022 Chronic systolic (congestive) heart failure: Secondary | ICD-10-CM | POA: Diagnosis not present

## 2019-07-20 DIAGNOSIS — I25118 Atherosclerotic heart disease of native coronary artery with other forms of angina pectoris: Secondary | ICD-10-CM | POA: Diagnosis not present

## 2019-07-20 DIAGNOSIS — I255 Ischemic cardiomyopathy: Secondary | ICD-10-CM | POA: Diagnosis not present

## 2019-07-25 NOTE — Progress Notes (Signed)
Pt aware.

## 2019-07-26 DIAGNOSIS — Z95810 Presence of automatic (implantable) cardiac defibrillator: Secondary | ICD-10-CM

## 2019-07-26 DIAGNOSIS — Z4502 Encounter for adjustment and management of automatic implantable cardiac defibrillator: Secondary | ICD-10-CM

## 2019-07-26 DIAGNOSIS — I5022 Chronic systolic (congestive) heart failure: Secondary | ICD-10-CM

## 2019-07-28 ENCOUNTER — Telehealth: Payer: Self-pay

## 2019-07-28 NOTE — Telephone Encounter (Signed)
Pt aware.

## 2019-07-28 NOTE — Telephone Encounter (Signed)
-----   Message from [name], MD sent at 07/24/2019  5:15 PM EDT ----- Regarding: ICD Normal function. No CHF. I am very pleased.  JG

## 2019-08-08 ENCOUNTER — Ambulatory Visit (INDEPENDENT_AMBULATORY_CARE_PROVIDER_SITE_OTHER): Payer: Medicare Other

## 2019-08-08 ENCOUNTER — Other Ambulatory Visit: Payer: Self-pay

## 2019-08-08 DIAGNOSIS — I25118 Atherosclerotic heart disease of native coronary artery with other forms of angina pectoris: Secondary | ICD-10-CM | POA: Diagnosis not present

## 2019-08-08 DIAGNOSIS — I255 Ischemic cardiomyopathy: Secondary | ICD-10-CM

## 2019-08-08 DIAGNOSIS — I5022 Chronic systolic (congestive) heart failure: Secondary | ICD-10-CM

## 2019-08-30 ENCOUNTER — Other Ambulatory Visit: Payer: Self-pay

## 2019-08-30 DIAGNOSIS — Z20822 Contact with and (suspected) exposure to covid-19: Secondary | ICD-10-CM

## 2019-09-01 LAB — NOVEL CORONAVIRUS, NAA: SARS-CoV-2, NAA: NOT DETECTED

## 2019-09-26 DIAGNOSIS — I5022 Chronic systolic (congestive) heart failure: Secondary | ICD-10-CM

## 2019-09-26 DIAGNOSIS — Z4502 Encounter for adjustment and management of automatic implantable cardiac defibrillator: Secondary | ICD-10-CM

## 2019-09-26 DIAGNOSIS — Z95810 Presence of automatic (implantable) cardiac defibrillator: Secondary | ICD-10-CM | POA: Diagnosis not present

## 2019-09-28 ENCOUNTER — Ambulatory Visit: Payer: Medicare Other | Admitting: Cardiology

## 2019-10-28 ENCOUNTER — Ambulatory Visit: Payer: Medicare Other | Attending: Internal Medicine

## 2019-10-28 DIAGNOSIS — Z20822 Contact with and (suspected) exposure to covid-19: Secondary | ICD-10-CM

## 2019-10-29 LAB — NOVEL CORONAVIRUS, NAA: SARS-CoV-2, NAA: NOT DETECTED

## 2019-11-19 ENCOUNTER — Other Ambulatory Visit: Payer: Self-pay | Admitting: Gastroenterology

## 2019-11-19 DIAGNOSIS — R1032 Left lower quadrant pain: Secondary | ICD-10-CM

## 2020-01-03 IMAGING — DX PORTABLE CHEST - 1 VIEW
1 series · 1 of 1 positions shown · non-contrast
Comparison: 12/16/2017

CLINICAL DATA: Cough and fever

EXAM:
PORTABLE CHEST 1 VIEW

[chest ap]
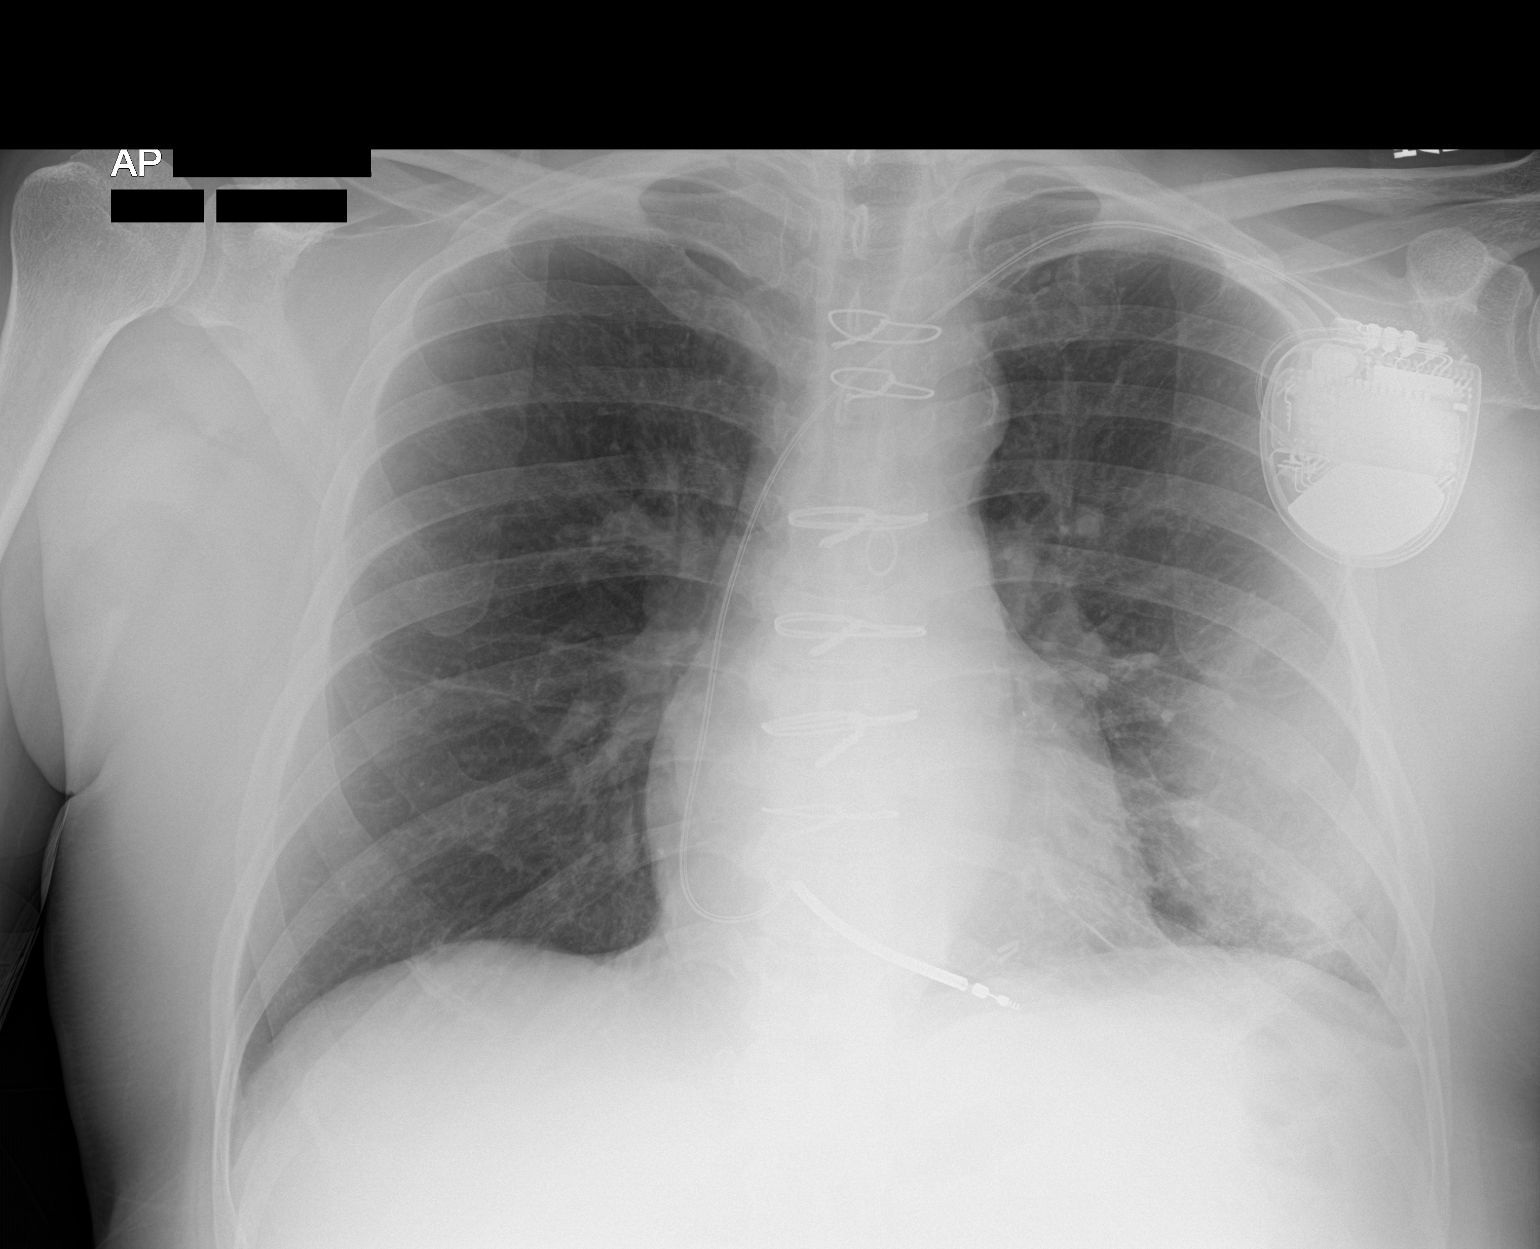

[1 of 1 positions shown; findings below may reference images not displayed]

FINDINGS: New density in the left lung base consistent with pneumonia. Minimal
left effusion

Right lung clear. AICD remains in good position. Negative for heart
failure.
IMPRESSION: New airspace disease in the left lung base compatible with
pneumonia.

## 2020-01-04 ENCOUNTER — Telehealth: Payer: Self-pay

## 2020-01-04 NOTE — Telephone Encounter (Signed)
Patient called me back for me to go over his 13-hour prep instructions that I called in to his pharmacy in [place] at 0750 this morning.  Prednisone 50 mg PO 01/08/20 @ 2030, 4/12 @ 0230 and 0830.  Also, Benadryl 50 mg PO 01/09/20 @ 0830.

## 2020-01-09 ENCOUNTER — Ambulatory Visit
Admission: RE | Admit: 2020-01-09 | Discharge: 2020-01-09 | Disposition: A | Discharge status: Home / Self Care | Payer: Medicare Other | Source: Ambulatory Visit | Attending: Gastroenterology | Admitting: Gastroenterology

## 2020-01-09 DIAGNOSIS — R1032 Left lower quadrant pain: Secondary | ICD-10-CM

## 2020-01-09 MED ORDER — IOPAMIDOL (ISOVUE-300) INJECTION 61%
100.0000 mL | Freq: Once | INTRAVENOUS | Status: AC | PRN
  Administered 2020-01-09: 100 mL via INTRAVENOUS

## 2020-01-10 NOTE — Progress Notes (Signed)
Primary Physician/Referring:  [name], MD  Patient ID: Luis Luis [name], Luis [name]    DOB: 1946-06-12, 74 y.o.   MRN: [id number]  Chief Complaint  Patient presents with  . Coronary Artery Disease  . Hyperlipidemia  . Follow-up    6 month    HPI: Luis Luis [name]  is a 74 y.o. Luis [name]  with coronary artery disease with history of emergent CABG with LIMA to LAD and SVG to OM by Tharon Aquas Tright. His past medical history includes hyperlipidemia. Underwent single chamber Medtronic ICD implantation on 12/15/2017 for primary prevention in view of persistent low LVEF.   He presents here for 66-monthoffice visit, he was evaluated by Dr. MCollene Maresfor abdominal discomfort and underwent CT of the abdomen yesterday.  No significant abnormality was noted.  Denies chest pain, denies dyspnea, no leg edema.  Occasionally has noticed his blood pressure to be low.  No dizziness or syncope.  Past Medical History:  Diagnosis Date  . AICD (automatic cardioverter/defibrillator) present 12/15/2017  . AKI (acute kidney injury) ([facility] 03/28/2017  . Cardiogenic shock ([facility] 03/28/2017  . Chronic combined systolic and diastolic heart failure ([facility] 05/20/2019  . Chronic kidney disease    kidney stones; noted per H&P per Dr MMellody Drown5/06/2015 chronic kidney disease stage II  . Dyslipidemia   . Encounter for assessment of implantable cardioverter-defibrillator (ICD) 05/20/2019  . History of hepatomegaly    with fatty liver discussed per H&P per Dr MMellody Drown5/06/2015 secondary to abd ultrasound   . History of kidney stones   . LVAD (left ventricular assist device) present ([facility]   . Pneumothorax on right   . Sleep apnea    AHI 17 on sleep study 08/23/2013 pt states was given CPAP machine but sent it back - does not use now 12/15/2017  . 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  . Vitamin D deficiency    as noted per H&P per Dr MMellody Drown5/06/2015    Past Surgical History:  Procedure Laterality Date  . ASD REPAIR N/A  03/25/2017   Procedure: ATRIAL SEPTAL DEFECT (ASD) REPAIR;  Surgeon: VIvin Poot MD;  [place] [facility]  Service: Open Heart Surgery;  Laterality: N/A;  . BIOPSY  03/10/2019   Procedure: BIOPSY;  Surgeon: MJuanita Craver MD;  [facility];  Service: Endoscopy;;  . CARDIAC DEFIBRILLATOR PLACEMENT  12/15/2017  . COLONOSCOPY WITH PROPOFOL N/A 03/10/2019   Procedure: COLONOSCOPY WITH PROPOFOL;  Surgeon: MJuanita Craver MD;  [facility]: [facility];  Service: Endoscopy;  Laterality: N/A;  . CORONARY ARTERY BYPASS GRAFT N/A 03/25/2017   Procedure: CORONARY ARTERY BYPASS GRAFTING (CABG) x 2 using left internal mammary artery and right greater saphenous leg vein using endosciope.;  Surgeon: VIvin Poot MD;  [place] [facility]  Service: Open Heart Surgery;  Laterality: N/A;  . CYSTOSCOPY W/ RETROGRADES Left 03/26/2015   Procedure: CYSTOSCOPY WITH RETROGRADE PYELOGRAM;  Surgeon: TAlexis Frock MD;  [facility];  Service: Urology;  Laterality: Left;  . ICD IMPLANT N/A 12/15/2017   Procedure: ICD IMPLANT;  Surgeon: CConstance Haw MD;  [facility];  Service: Cardiovascular;  Laterality: N/A;  . LEFT HEART CATH AND CORONARY ANGIOGRAPHY N/A 03/25/2017   Procedure: Left Heart Cath and Coronary Angiography;  Surgeon: GAdrian Prows MD;  [facility];  Service: Cardiovascular;  Laterality: N/A;  . PLACEMENT OF IGrahamLEFT VENTRICULAR ASSIST DEVICE  03/25/2017   Procedure: PLACEMENT OF IIndustryLEFT VENTRICULAR ASSIST DEVICE;  Surgeon: VIvin Poot  MD;  Location: [place];  Service: Open Heart Surgery;;  . REMOVAL OF IMPELLA LEFT VENTRICULAR ASSIST DEVICE N/A 03/31/2017   Procedure: REMOVAL OF IMPELLA LEFT VENTRICULAR ASSIST DEVICE;  Surgeon: [name], MD;  Location: [place];  Service: Open Heart Surgery;  Laterality: N/A;  . ROBOT ASSISTED PYELOPLASTY Left 03/26/2015   Procedure: ROBOTIC ASSISTED PYELOPLASTY WITH STENT PLACEMENT, REMOVAL OF STONE AND CYST  DECORTICATION;  Surgeon: [name], MD;  Location[facility];  Service: Urology;  Laterality: Left;  . TEE WITHOUT CARDIOVERSION N/A 03/25/2017   Procedure: TRANSESOPHAGEAL ECHOCARDIOGRAM (TEE);  Surgeon: [name], [name], MD;  Location: [place];  Service: Open Heart Surgery;  Laterality: N/A;  . TEE WITHOUT CARDIOVERSION N/A 03/31/2017   Procedure: TRANSESOPHAGEAL ECHOCARDIOGRAM (TEE);  Surgeon: [name], [name], MD;  Location: [place];  Service: Open Heart Surgery;  Laterality: N/A;   Social History   Tobacco Use  . Smoking status: Never Smoker  . Smokeless tobacco: Never Used  Substance Use Topics  . Alcohol use: No   Marital Status: Married   Review of Systems  Cardiovascular: Negative for chest pain, dyspnea on exertion and leg swelling.  Gastrointestinal: Positive for abdominal pain. Negative for melena.   Objective  Blood pressure 116/77, pulse 76, temperature 97.6 F (36.4 C), temperature source Temporal, resp. rate 16, height 5' 8"  (1.727 m), weight 185 lb 4.8 oz (84.1 kg), SpO2 98 %. Body mass index is 28.17 kg/m.    Physical Exam  Constitutional: He appears well-developed and well-nourished. No distress.  Cardiovascular: Normal rate, regular rhythm, normal heart sounds and intact distal pulses. Exam reveals no gallop.  No murmur heard. No JVD. No pedal edema.  Pulmonary/Chest: Effort normal and breath sounds normal.  Pacemaker/ICD site noted  in the left infraclavicular fossa.   Abdominal: Soft. Bowel sounds are normal.   Radiology: No results found.  Laboratory examination:   Labs 05/03/2019: Potassium 5.2, BUN 14, creatinine 0.9, eGFR greater than 60 him up, CMP otherwise normal.  A1c 6.1%.  Labs 03/17/2019: Total cholesterol 114, triglycerides 180, HDL 41, LDL 37.  Non-HDL cholesterol 73.  HB 15.1/HCT 44.8, platelets 291.  Potassium 5.4, BUN 12, creatinine 1.1, eGFR 63/73 ML.  A1c 6.1%.  CMP Latest Ref Rng & Units 04/25/2019 12/10/2017 04/17/2017  Glucose 70 - 99  mg/dL 108(H) 104(H) 93  BUN 8 - 23 mg/dL 20 18 12   Creatinine 0.61 - 1.24 mg/dL 1.18 1.00 1.19  Sodium 135 - 145 mmol/L 132(L) 140 134(L)  Potassium 3.5 - 5.1 mmol/L 5.1 5.1 4.3  Chloride 98 - 111 mmol/L 104 104 101  CO2 22 - 32 mmol/L 17(L) 23 24  Calcium 8.9 - 10.3 mg/dL 8.4(L) 9.4 8.5(L)  Total Protein 6.5 - 8.1 g/dL - - -  Total Bilirubin 0.3 - 1.2 mg/dL - - -  Alkaline Phos 38 - 126 U/L - - -  AST 15 - 41 U/L - - -  ALT 17 - 63 U/L - - -   CBC Latest Ref Rng & Units 04/25/2019 12/10/2017 04/16/2017  WBC 4.0 - 10.5 K/uL 12.0(H) 6.5 7.7  Hemoglobin 13.0 - 17.0 g/dL 14.8 13.8 9.9(L)  Hematocrit 39.0 - 52.0 % 43.9 40.5 30.8(L)  Platelets 150 - 400 K/uL 180 243 414(H)   Lipid Panel  No results found for: CHOL, TRIG, HDL, CHOLHDL, VLDL, LDLCALC, LDLDIRECT HEMOGLOBIN A1C Lab Results  Component Value Date   HGBA1C 6.1 (H) 03/26/2017   MPG 128 03/26/2017   TSH No results  for input(s): TSH in the last 8760 hours.   External Labs:  Labs 10/25/2019: Hb 15.2/HCT 42.9, platelets 274.  Sodium 139, potassium 5.4, serum glucose 99 mg, BUN 8, creatinine 1.11, EGFR 66 mL.  Total cholesterol 118, triglycerides 180, HDL 40, LDL 42.  A1c 6.1%.  Medications   Current Outpatient Medications  Medication Instructions  . aspirin EC 81 mg, Oral, Daily  . carvedilol (COREG) 3.125 mg, Oral, 2 times daily  . cholecalciferol (VITAMIN D) 1,000 Units, Oral, Daily  . CoQ10 200 mg, Oral, Daily  . fenofibrate (TRICOR) 48 mg, Oral, Daily with lunch  . nitroGLYCERIN (NITROSTAT) 0.4 mg, Sublingual, Every 5 min PRN  . Repatha SureClick 505 mg, Subcutaneous, Every 14 days  . sacubitril-valsartan (ENTRESTO) 24-26 MG 1 tablet, Oral, 2 times daily  . vitamin B-12 (CYANOCOBALAMIN) 1,000 mcg, Oral, Daily   Cardiac Studies:   Coronary angiogram 03/25/2017: Mild decrease in LV systolic function, EF 39% with inferior, inferoapical severe hypokinesis. LAD CTO midsegment just after origin of a large D1, short  segment occlusion with type II collaterals from the RCA. Bridging Collaterals also evident. Mid circumflex very large with a 75% stenosis. Large OM1. Mild disease. RCA mild diffuse disease, large vessel. Type II collaterals to the LAD. Distal small PL secondary branch is occluded and has collaterals from left to right.   Interventional data: Unsuccessful attempt at PTCA LAD, left main dissection occurred, patient had to be emergently transferred to the operating room. Patient's family members including wife and son were met with, Dr. Dahlia Byes evaluated the patient in the Cath Lab. 150 mL contrast utilized.  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  Echocardiogram 05/25/2018: Left ventricle cavity is normal in size. Mild concentric hypertrophy of the left ventricle. LAD territory akinesis. Doppler evidence of grade I (impaired) diastolic dysfunction, normal LAP. LVEF 25-30%. Left atrial cavity is mildly dilated. No significant valvular abnormality. Inadequate tricuspid regurgitation jet to estimate pulmonary artery pressure. Normal right atrial pressure.  EKG 01/11/2020: Normal sinus rhythm with rate of 80 bpm, normal axis.  Incomplete right bundle branch block.  Poor R progression, cannot exclude anteroseptal infarct old.  Diffuse nonspecific T abnormality, cannot exclude inferior and anterolateral ischemia.  Borderline low voltage complexes.  .nsac 07/19/2019.   Assessment     ICD-10-CM   1. Coronary artery disease involving native coronary artery of native heart without angina pectoris  I25.10 EKG 12-Lead    Evolocumab SOSY 140 mg  2. Chronic systolic heart failure (HCC)  I50.22   3. Mixed hyperlipidemia  E78.2 fenofibrate (TRICOR) 48 MG tablet    Lipid Panel With LDL/HDL Ratio    Lipid Panel With LDL/HDL Ratio    Evolocumab SOSY 140 mg  4. ICD: Medtronic  single chamber Visia AF MRI VR [id number] ICD -  12/15/2017   Z95.810     Remote ICD check   06/14/2019:    No VHR episodes, no mode switches. OptiVol Fluid Index do no present significant abnormalities. Battery longevity is 9.6-11.3 years. RV pacing is <0.1 %.  Clinic ICD check 04/11/2019: Presently sinus rhythm at 80 bpm.  No VHR or AHR episodes.  Lead health is normal.  No therapy.  OptiVol although little baseline, is slightly trending up.  Longevity >10 years.  Normal function.  Recommendations:   Luis Luis [name]  is a 74 y.o. Luis [name]  with coronary artery disease with history of emergent CABG with LIMA to LAD and SVG to OM by Tharon Aquas Tright.  His past medical history includes hyperlipidemia. Underwent single chamber Medtronic ICD implantation on 12/15/2017 for primary prevention in view of persistent low LVEF.   He is on appropriate medical therapy, he has very low borderline blood pressure, hence I'll not been able to titrate Entresto any higher than what it is today.  He is also on a very low-dose of beta blocker therapy.  Continue the same. No clinical evidence of congestive heart failure, his ICD data was reviewed with the patient. He has not had recent transmission and encouraged him to keep the schedule.  He will continue to monitor his diet closely and will continue remote monitoring of his ICD.  I also reviewed his recent labs from the PCP.  CBC and CMP has remained stable.  Lipids are well controlled with regard to LDL however his triglycerides continue to remain elevated.  I have added TriCor 48 mg daily and I will recheck lipids in a month to 6 weeks. Office visit in 6 months.  Given : 140 mg :  : Subcutaneous 01/11/20 1110 01/11/20 1112 [place], [place], CMA Left Anterior Thigh NDC [id number]    [name], MD, [facility] 01/11/2020, 11:00 AM [facility] [place] Office: [phone]

## 2020-01-11 ENCOUNTER — Encounter: Payer: Self-pay | Admitting: Cardiology

## 2020-01-11 ENCOUNTER — Other Ambulatory Visit: Payer: Self-pay

## 2020-01-11 ENCOUNTER — Ambulatory Visit: Payer: Medicare Other | Admitting: Cardiology

## 2020-01-11 VITALS — BP 116/77 | HR 76 | Temp 97.6°F | Resp 16 | Ht 68.0 in | Wt 185.3 lb

## 2020-01-11 DIAGNOSIS — I5022 Chronic systolic (congestive) heart failure: Secondary | ICD-10-CM

## 2020-01-11 DIAGNOSIS — Z95810 Presence of automatic (implantable) cardiac defibrillator: Secondary | ICD-10-CM

## 2020-01-11 DIAGNOSIS — E782 Mixed hyperlipidemia: Secondary | ICD-10-CM

## 2020-01-11 DIAGNOSIS — I2510 Atherosclerotic heart disease of native coronary artery without angina pectoris: Secondary | ICD-10-CM

## 2020-01-11 MED ORDER — EVOLOCUMAB 140 MG/ML ~~LOC~~ SOSY
140.0000 mg | PREFILLED_SYRINGE | Freq: Once | SUBCUTANEOUS | Status: AC
  Administered 2020-01-11: 11:00:00 140 mg via SUBCUTANEOUS

## 2020-01-11 MED ORDER — FENOFIBRATE 48 MG PO TABS: 48.0000 mg | ORAL_TABLET | Freq: Every day | ORAL | 6 refills | Status: DC

## 2020-02-15 ENCOUNTER — Other Ambulatory Visit: Payer: Self-pay | Admitting: Cardiology

## 2020-02-17 LAB — LIPID PANEL WITH LDL/HDL RATIO
Cholesterol, Total: 114 mg/dL (ref 100–199)
HDL: 38 mg/dL — ABNORMAL LOW (ref >39)
LDL Chol Calc (NIH): 52 mg/dL (ref 0–99)
LDL/HDL Ratio: 1.4 ratio (ref 0.0–3.6)
Triglycerides: 135 mg/dL (ref 0–149)
VLDL Cholesterol Cal: 24 mg/dL (ref 5–40)

## 2020-03-28 ENCOUNTER — Encounter: Payer: Self-pay | Admitting: Cardiology

## 2020-03-28 ENCOUNTER — Ambulatory Visit: Payer: Medicare Other | Admitting: Cardiology

## 2020-03-28 ENCOUNTER — Other Ambulatory Visit: Payer: Self-pay

## 2020-03-28 DIAGNOSIS — Z4502 Encounter for adjustment and management of automatic implantable cardiac defibrillator: Secondary | ICD-10-CM

## 2020-03-28 DIAGNOSIS — Z95810 Presence of automatic (implantable) cardiac defibrillator: Secondary | ICD-10-CM

## 2020-03-28 DIAGNOSIS — I5042 Chronic combined systolic (congestive) and diastolic (congestive) heart failure: Secondary | ICD-10-CM

## 2020-03-28 NOTE — Progress Notes (Signed)
ICD-10-CM   1. Encounter for assessment of implantable cardioverter-defibrillator (ICD)  Z45.02   2. ICD: Medtronic  single chamber Visia AF MRI VR [id number] ICD -  12/15/2017   Z95.810   3. Chronic combined systolic and diastolic heart failure (HCC)  I50.42     Scheduled  In office ICD 03/28/20  Single (S)/Dual (D)/BV (M) S Presenting VS Pacer dependant: No. Underlying VS. VP <0.1%.  AMS Episodes 0.   HVR 0.  Longevity 9.9 Years/Voltage. Charge time 3.8Sec. Lead measurements: Stable Histogram: Low (L)/normal (N)/high (H)  Normal. Patient activity Increased. Thoracic impedance: Below threshold  Observations: Normal function.  Changes: None.  Patient is presently doing well, he has not had any acute decompensated heart failure, no changes in the ICD parameters were done.  His Charity fundraiser on the transmitter is not working we will replace the unit.  He will keep all other appointments.

## 2020-06-12 ENCOUNTER — Other Ambulatory Visit: Payer: Self-pay | Admitting: Cardiology

## 2020-06-12 DIAGNOSIS — E7800 Pure hypercholesterolemia, unspecified: Secondary | ICD-10-CM

## 2020-06-12 DIAGNOSIS — I2510 Atherosclerotic heart disease of native coronary artery without angina pectoris: Secondary | ICD-10-CM

## 2020-07-12 ENCOUNTER — Other Ambulatory Visit: Payer: Self-pay

## 2020-07-12 ENCOUNTER — Ambulatory Visit: Payer: Medicare Other | Admitting: Cardiology

## 2020-07-12 ENCOUNTER — Encounter: Payer: Self-pay | Admitting: Cardiology

## 2020-07-12 VITALS — BP 105/59 | HR 75 | Resp 16 | Ht 68.0 in | Wt 184.0 lb

## 2020-07-12 DIAGNOSIS — Z95810 Presence of automatic (implantable) cardiac defibrillator: Secondary | ICD-10-CM

## 2020-07-12 DIAGNOSIS — I5022 Chronic systolic (congestive) heart failure: Secondary | ICD-10-CM

## 2020-07-12 DIAGNOSIS — E782 Mixed hyperlipidemia: Secondary | ICD-10-CM

## 2020-07-12 DIAGNOSIS — I2510 Atherosclerotic heart disease of native coronary artery without angina pectoris: Secondary | ICD-10-CM

## 2020-07-12 MED ORDER — EVOLOCUMAB 140 MG/ML ~~LOC~~ SOAJ
140.0000 mg | Freq: Once | SUBCUTANEOUS | Status: AC
  Administered 2020-07-12: 140 mg via SUBCUTANEOUS

## 2020-07-12 NOTE — Progress Notes (Signed)
Primary Physician/Referring:  [name], MD  Patient ID: Luis [name], male    DOB: Aug 22, 1946, 74 y.o.   MRN: [id number]  Chief Complaint  Patient presents with  . Cardiomyopathy  . Coronary Artery Disease  . Follow-up    6 month    HPI: Luis [name]  is a 74 y.o. male  with coronary artery disease with history of emergent CABG with LIMA to LAD and SVG to OM by Tharon Aquas Tright. His past medical history includes hyperlipidemia. Underwent single chamber Medtronic ICD implantation on 12/15/2017 for primary prevention in view of persistent low LVEF.   Presents for 6 month follow up. At last visit he started TriCor 33m daily, and upon lipid recheck both LDL and triglycerides were well controlled.  He is presently doing well, denies chest pain, dyspnea, leg edema, orthopnea, PND.  Denies dizziness, syncope, near syncope.  Does report complain of flatus, no loose stools or increased frequency of bowel movements.   Past Medical History:  Diagnosis Date  . AICD (automatic cardioverter/defibrillator) present 12/15/2017  . AKI (acute kidney injury) ([place] 03/28/2017  . Cardiogenic shock ([place] 03/28/2017  . Chronic combined systolic and diastolic heart failure ([facility] 05/20/2019  . Chronic kidney disease    kidney stones; noted per H&P per Dr MMellody Drown5/06/2015 chronic kidney disease stage II  . Dyslipidemia   . Encounter for assessment of implantable cardioverter-defibrillator (ICD) 05/20/2019  . History of hepatomegaly    with fatty liver discussed per H&P per Dr MMellody Drown5/06/2015 secondary to abd ultrasound   . History of kidney stones   . LVAD (left ventricular assist device) present ([place]   . Pneumothorax on right   . Sleep apnea    AHI 17 on sleep study 08/23/2013 pt states was given CPAP machine but sent it back - does not use now 12/15/2017  . 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  . Vitamin D deficiency    as noted per H&P per Dr MMellody Drown5/06/2015    Past Surgical  History:  Procedure Laterality Date  . ASD REPAIR N/A 03/25/2017   Procedure: ATRIAL SEPTAL DEFECT (ASD) REPAIR;  Surgeon: VIvin Poot MD;  [place]  Service: Open Heart Surgery;  Laterality: N/A;  . BIOPSY  03/10/2019   Procedure: BIOPSY;  Surgeon: MJuanita Craver MD;  [facility];  Service: Endoscopy;;  . CARDIAC DEFIBRILLATOR PLACEMENT  12/15/2017  . COLONOSCOPY WITH PROPOFOL N/A 03/10/2019   Procedure: COLONOSCOPY WITH PROPOFOL;  Surgeon: MJuanita Craver MD;  [facility];  Service: Endoscopy;  Laterality: N/A;  . CORONARY ARTERY BYPASS GRAFT N/A 03/25/2017   Procedure: CORONARY ARTERY BYPASS GRAFTING (CABG) x 2 using left internal mammary artery and right greater saphenous leg vein using endosciope.;  Surgeon: VIvin Poot MD;  [place] [facility]  Service: Open Heart Surgery;  Laterality: N/A;  . CYSTOSCOPY W/ RETROGRADES Left 03/26/2015   Procedure: CYSTOSCOPY WITH RETROGRADE PYELOGRAM;  Surgeon: TAlexis Frock MD;  [facility];  Service: Urology;  Laterality: Left;  . ICD IMPLANT N/A 12/15/2017   Procedure: ICD IMPLANT;  Surgeon: CConstance Haw MD;  [place] [facility];  Service: Cardiovascular;  Laterality: N/A;  . LEFT HEART CATH AND CORONARY ANGIOGRAPHY N/A 03/25/2017   Procedure: Left Heart Cath and Coronary Angiography;  Surgeon: GAdrian Prows MD;  [facility];  Service: Cardiovascular;  Laterality: N/A;  . PLACEMENT OF [place]ICE  03/25/2017   Procedure: PLACEMENT OF IMPELLA LEFT  VENTRICULAR ASSIST DEVICE;  Surgeon: [name], MD;  Location: [facility];  Service: Open Heart Surgery;;  . REMOVAL OF IMPELLA LEFT VENTRICULAR ASSIST DEVICE N/A 03/31/2017   Procedure: REMOVAL OF IMPELLA LEFT VENTRICULAR ASSIST DEVICE;  Surgeon: [name], MD;  Location: [facility];  Service: Open Heart Surgery;  Laterality: N/A;  . ROBOT ASSISTED PYELOPLASTY Left 03/26/2015   Procedure: ROBOTIC ASSISTED  PYELOPLASTY WITH STENT PLACEMENT, REMOVAL OF STONE AND CYST DECORTICATION;  Surgeon: [name], MD;  [facility];  Service: Urology;  Laterality: Left;  . TEE WITHOUT CARDIOVERSION N/A 03/25/2017   Procedure: TRANSESOPHAGEAL ECHOCARDIOGRAM (TEE);  Surgeon: [name], [name], MD;  Location: [facility];  Service: Open Heart Surgery;  Laterality: N/A;  . TEE WITHOUT CARDIOVERSION N/A 03/31/2017   Procedure: TRANSESOPHAGEAL ECHOCARDIOGRAM (TEE);  Surgeon: [name], [name], MD;  Location: [facility];  Service: Open Heart Surgery;  Laterality: N/A;   Social History   Tobacco Use  . Smoking status: Never Smoker  . Smokeless tobacco: Never Used  Substance Use Topics  . Alcohol use: No   Marital Status: Married   Review of Systems  Cardiovascular: Negative for chest pain, dyspnea on exertion, leg swelling, orthopnea, palpitations, paroxysmal nocturnal dyspnea and syncope.  Gastrointestinal: Positive for flatus. Negative for melena.  Neurological: Negative for dizziness.   Objective  Blood pressure (!) 105/59, pulse 75, resp. rate 16, height _0  (1.727 m), weight 184 lb (83.5 kg), SpO2 98 %. Body mass index is 27.98 kg/m.    Physical Exam Constitutional:      General: He is not in acute distress.    Appearance: He is well-developed.  Cardiovascular:     Rate and Rhythm: Normal rate and regular rhythm.     Pulses: Intact distal pulses.          Carotid pulses are 2+ on the right side and 2+ on the left side.      Radial pulses are 2+ on the right side and 2+ on the left side.       Femoral pulses are 2+ on the right side and 2+ on the left side.      Popliteal pulses are 1+ on the right side and 1+ on the left side.       Dorsalis pedis pulses are 2+ on the right side and 2+ on the left side.       Posterior tibial pulses are 2+ on the right side and 2+ on the left side.     Heart sounds: Normal heart sounds, S1 normal and S2 normal. No murmur heard.  No gallop.      Comments: No JVD. No  pedal edema. Pulmonary:     Effort: Pulmonary effort is normal.     Breath sounds: Normal breath sounds.  Abdominal:     General: Bowel sounds are normal.     Palpations: Abdomen is soft.    Laboratory examination:   CMP Latest Ref Rng & Units 04/25/2019 12/10/2017 04/17/2017  Glucose 70 - 99 mg/dL 108(H) 104(H) 93  BUN 8 - 23 mg/dL _1 Creatinine 0.61 - 1.24 mg/dL 1.18 1.00 1.19  Sodium 135 - 145 mmol/L 132(L) 140 134(L)  Potassium 3.5 - 5.1 mmol/L 5.1 5.1 4.3  Chloride 98 - 111 mmol/L 104 104 101  CO2 22 - 32 mmol/L 17(L) 23 24  Calcium 8.9 - 10.3 mg/dL 8.4(L) 9.4 8.5(L)  Total Protein 6.5 - 8.1 g/dL - - -  Total Bilirubin  0.3 - 1.2 mg/dL - - -  Alkaline Phos 38 - 126 U/L - - -  AST 15 - 41 U/L - - -  ALT 17 - 63 U/L - - -   CBC Latest Ref Rng & Units 04/25/2019 12/10/2017 04/16/2017  WBC 4.0 - 10.5 K/uL 12.0(H) 6.5 7.7  Hemoglobin 13.0 - 17.0 g/dL 14.8 13.8 9.9(L)  Hematocrit 39 - 52 % 43.9 40.5 30.8(L)  Platelets 150 - 400 K/uL 180 243 414(H)   Lipid Panel     Component Value Date/Time   CHOL 114 02/16/2020 0823   TRIG 135 02/16/2020 0823   HDL 38 (L) 02/16/2020 0823   LDLCALC 52 02/16/2020 0823   HEMOGLOBIN A1C Lab Results  Component Value Date   HGBA1C 6.1 (H) 03/26/2017   MPG 128 03/26/2017   TSH No results for input(s): TSH in the last 8760 hours.  External Labs:   Labs 05/03/2019: Potassium 5.2, BUN 14, creatinine 0.9, eGFR greater than 60 him up, CMP otherwise normal.  A1c 6.1%.  Labs 03/17/2019: Total cholesterol 114, triglycerides 180, HDL 41, LDL 37.  Non-HDL cholesterol 73.  HB 15.1/HCT 44.8, platelets 291.  Potassium 5.4, BUN 12, creatinine 1.1, eGFR 63/73 ML.  A1c 6.1%.  Labs 10/25/2019: Hb 15.2/HCT 42.9, platelets 274. Sodium 139, potassium 5.4, serum glucose 99 mg, BUN 8, creatinine 1.11, EGFR 66 mL. Total cholesterol 118, triglycerides 180, HDL 40, LDL 42. A1c 6.1%.  Medications   Current Outpatient Medications  Medication Instructions   . aspirin EC 81 mg, Oral, Daily  . carvedilol (COREG) 3.125 mg, Oral, 2 times daily  . cholecalciferol (VITAMIN D) 1,000 Units, Oral, Daily  . CoQ10 200 mg, Oral, Daily  . fenofibrate (TRICOR) 48 mg, Oral, Daily with lunch  . nitroGLYCERIN (NITROSTAT) 0.4 mg, Sublingual, Every 5 min PRN  . Repatha SureClick 841 mg, Subcutaneous, Every 14 days  . sacubitril-valsartan (ENTRESTO) 24-26 MG 1 tablet, Oral, 2 times daily  . vitamin B-12 (CYANOCOBALAMIN) 1,000 mcg, Oral, Daily   Radiology:   No results found.  Cardiac Studies:   Coronary angiogram 03/25/2017: Mild decrease in LV systolic function, EF 66% with inferior, inferoapical severe hypokinesis. LAD CTO midsegment just after origin of a large D1, short segment occlusion with type II collaterals from the RCA. Bridging Collaterals also evident. Mid circumflex very large with a 75% stenosis. Large OM1. Mild disease. RCA mild diffuse disease, large vessel. Type II collaterals to the LAD. Distal small PL secondary branch is occluded and has collaterals from left to right.   Interventional data: Unsuccessful attempt at PTCA LAD, left main dissection occurred, patient had to be emergently transferred to the operating room. Patient's family members including wife and son were met with, Dr. Dahlia Byes evaluated the patient in the Cath Lab. 150 mL contrast utilized.  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  Echocardiogram 07/20/2019:  Left ventricle cavity is normal in size. Mild concentric hypertrophy of the left ventricle. Dyskinetic anterior/ anteroseptal wall with moderate global hypokinesis. Moderately depressed LV systolic function with EF 30-35%. Doppler evidence of grade I (impaired) diastolic dysfunction, normal LAP.  Device lead seen in RA/RV.  No significant valvular abnormality.  Normal right atrial pressure.  No significant change compared to previous study on 05/25/2018.  Exercise tetrofosmin  stress test  08/08/2019: Normal ECG stress. Patinet exercised for a total of 5 minutes and 59 seconds, achieving approximately 7.05 METs. Baseline heart rate was measured at 74 bpm. A maximum heart rate  of 132 beats per minute was achieved, which is 89% of maximum predicted heart rate. Normal BP resonse.  Mild degree medium extent mildly abnormal perfusion consistent with mild (reversible) ischemia located in the mid anteroseptal wall and basal anterior wall (Left Anterior Descending Artery region) of left ventricle. Akinesis of the apical anterior wall, apical cap, apical septal wall, mid anteroseptal wall, mid anterior wall, basal anteroseptal wall and basal anterior wall of the left ventricle.  Stress LV EF: 26%.  High risk sutyd due to severe decrease in LV systolic function and minimal ischemia.  No previous exam available for comparison.  ICD:   Scheduled  In office ICD 03/28/20  Single (S)/Dual (D)/BV (M) S Presenting VS Pacer dependant: No. Underlying VS. VP <0.1%.  AMS Episodes 0.   HVR 0.  Longevity 9.9 Years/Voltage. Charge time 3.8Sec. Lead measurements: Stable Histogram: Low (L)/normal (N)/high (H)  Normal. Patient activity Increased. Thoracic impedance: Below threshold Observations: Normal function.  Changes: None  Scheduled Remote ICD check  06/05/2020:    No VHR episodes, no mode switches. OptiVol Fluid Index do no present significant abnormalities. Battery longevity is 9.6 years. RV pacing is <0.1 %  EKG:   EKG 07/12/2020: Normal sinus rhythm with a rate of 73 bpm, left atrial enlargement. Normal axis. Incomplete right bundle branch block. Poor R wave progression, cannot exclude anterior septal infarct old. Low voltage complexes. Nonspecific T wave abnormality. Compared to EKG 01/11/2020, no significant change.   Assessment     ICD-10-CM   1. Coronary artery disease involving native coronary artery of native heart without angina pectoris  I25.10 EKG 12-Lead     Evolocumab SOAJ 140 mg  2. Chronic systolic heart failure (HCC)  I50.22   3. Mixed hyperlipidemia  E78.2   4. ICD: Medtronic  single chamber Visia AF MRI VR [id number] ICD -  12/15/2017   Z95.810      Meds ordered this encounter  Medications  . Evolocumab SOAJ 140 mg   There are no discontinued medications.  Recommendations:   Dilon Lank  is a 74 y.o. male  with coronary artery disease with history of emergent CABG with LIMA to LAD and SVG to OM by Tharon Aquas Tright. His past medical history includes hyperlipidemia. Underwent single chamber Medtronic ICD implantation on 12/15/2017 for primary prevention in view of persistent low LVEF.   Patient presents for 23-monthfollow-up, he is presently doing well. There are no clinical signs of heart failure. He is currently on appropriate guideline directed medical therapy.  Of note he has borderline low blood pressure and therefore have been unable to titrate Entresto any higher than 24/26 mg.  Will continue Entresto twice daily, as well as carvedilol 3.125 twice daily, aspirin 81 mg daily, TriCor 48 mg daily, and Repatha.  In view of patient's complaints of increased flatus discussed the option of switching carvedilol to metoprolol to see if it would improve, however patient elected to continue with carvedilol at this time.  Reviewed and discussed ICD data, echocardiogram, stress test with patient.  Encouraged him to continue to monitor diet and activity closely.  Will continue remote monitoring of his ICD.  Since starting TriCor triglycerides have improved. Reviewed labs, lipids well controlled. He was given Repatha shot today in clinic.   Also reviewed EKG done today, it is unchanged compared to previous. Of note he has been evaluated by Dr. MCollene Maresfor abdominal discomfort, with no specific etiology identified. Patient does report this has improved since last visit.  Follow-up in 6 months for coronary artery disease, heart failure.    Patient was seen  in collaboration with Dr. [name]. He also reviewed patient's chart and examined the patient. Dr. [name] is in agreement of the plan.    Given : 140 mg :  : Subcutaneous 07/12/20 1026 07/12/20 1027 [place], [place], CMA Right Anterior Thigh    Alethia Berthold, PA-C 07/12/2020, 11:03 AM Office: [phone]

## 2020-08-01 ENCOUNTER — Ambulatory Visit: Payer: Medicare Other | Admitting: Orthopedic Surgery

## 2020-08-01 DIAGNOSIS — M79604 Pain in right leg: Secondary | ICD-10-CM | POA: Diagnosis not present

## 2020-08-01 DIAGNOSIS — M79605 Pain in left leg: Secondary | ICD-10-CM | POA: Diagnosis not present

## 2020-08-05 ENCOUNTER — Encounter: Payer: Self-pay | Admitting: Orthopedic Surgery

## 2020-08-05 NOTE — Progress Notes (Signed)
Office Visit Note   Patient: Luis [name]           Date of Birth: 1946/09/10           MRN: [id number] Visit Date: 08/01/2020 Requested by: [name], MD [id number] [place],  [place] [address] PCP: [name], MD  Subjective: Chief Complaint  Patient presents with  . Left Knee - Pain  . Right Knee - Pain  . Lower Back - Pain    HPI: Patient presents for evaluation of back and low back pain.  Denies any radiation or numbness and tingling.  Reports bilateral knee pain as well right equal to left.  Pain does not wake him from sleep at night.  He is able to walk 2 to 3 mi a day.  Has on and off low back pain without radiation.  Does not take any medication.  Does not really have walking endurance limitation due to his back.  He is retired from [employer].  Does hurt him some to bend.  He has a defibrillator in place and is on blood thinners.  Patient had similar symptoms a year ago and physical therapy helped.              ROS: All systems reviewed are negative as they relate to the chief complaint within the history of present illness.  Patient denies  fevers or chills.   Assessment & Plan: Visit Diagnoses:  1. Bilateral leg pain     Plan: Impression is low back pain and mild bilateral knee pain.  Plan is physical therapy here for bilateral knee quad strengthening.  Also needs some core strengthening on the lower back.  No red flag symptoms today.  Follow-up if those interventions do not help.  Radiographs from last year of the back and knees were pretty unremarkable.  Follow-Up Instructions: Return if symptoms worsen or fail to improve.   Orders:  Orders Placed This Encounter  Procedures  . Ambulatory referral to Physical Therapy   No orders of the defined types were placed in this encounter.     Procedures: No procedures performed   Clinical Data: No additional findings.  Objective: Vital Signs: There were no vitals taken for this visit.  Physical  Exam:   Constitutional: Patient appears well-developed HEENT:  Head: Normocephalic Eyes:EOM are normal Neck: Normal range of motion Cardiovascular: Normal rate Pulmonary/chest: Effort normal Neurologic: Patient is alert Skin: Skin is warm Psychiatric: Patient has normal mood and affect    Ortho Exam: Ortho exam demonstrates full active and passive range of motion both knees with intact extensor mechanism.  Collateral crucial ligaments are stable.  Pedal pulses palpable.  No groin pain with internal X rotation of the leg.  No effusion.  Mild pain with forward bending but no trochanteric tenderness in the back.  Reflexes symmetric bilateral patella and Achilles 1+ out of 4.  Specialty Comments:  No specialty comments available.  Imaging: No results found.   PMFS History: Patient Active Problem List   Diagnosis Date Noted  . Chronic combined systolic and diastolic heart failure ([place]) 05/20/2019  . Encounter for assessment of implantable cardioverter-defibrillator (ICD) 05/20/2019  . Ischemic cardiomyopathy 12/15/2017  . ICD: Medtronic  single chamber Visia AF MRI VR [id number] ICD -  12/15/2017  12/15/2017  . Dyspnea 04/16/2017  . Acute combined systolic and diastolic heart failure ([place]) 04/16/2017  . 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 Acute respiratory failure with hypoxia ([place]) 03/28/2017  . Coronary  artery disease 03/25/2017   Past Medical History:  Diagnosis Date  . AICD (automatic cardioverter/defibrillator) present 12/15/2017  . AKI (acute kidney injury) ([place]) 03/28/2017  . Cardiogenic shock ([place]) 03/28/2017  . Chronic combined systolic and diastolic heart failure ([facility]) 05/20/2019  . Chronic kidney disease    kidney stones; noted per H&P per Dr [name] 02/06/2015 chronic kidney disease stage II  . Dyslipidemia   . Encounter for assessment of implantable cardioverter-defibrillator (ICD) 05/20/2019  . History of hepatomegaly    with fatty liver discussed per H&P per Dr [name]  02/06/2015 secondary to abd ultrasound   . History of kidney stones   . LVAD (left ventricular assist device) present ([place])   . Pneumothorax on right   . Sleep apnea    AHI 17 on sleep study 08/23/2013 pt states was given CPAP machine but sent it back - does not use now 12/15/2017  . 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  . Vitamin D deficiency    as noted per H&P per Dr [name] 02/06/2015    Family History  Problem Relation Age of Onset  . Healthy Mother   . Healthy Father   . Healthy Sister   . Healthy Brother     Past Surgical History:  Procedure Laterality Date  . ASD REPAIR N/A 03/25/2017   Procedure: ATRIAL SEPTAL DEFECT (ASD) REPAIR;  Surgeon: [name], MD;  Location: [facility];  Service: Open Heart Surgery;  Laterality: N/A;  . BIOPSY  03/10/2019   Procedure: BIOPSY;  Surgeon: Juanita Craver, MD;  [facility];  Service: Endoscopy;;  . CARDIAC DEFIBRILLATOR PLACEMENT  12/15/2017  . COLONOSCOPY WITH PROPOFOL N/A 03/10/2019   Procedure: COLONOSCOPY WITH PROPOFOL;  Surgeon: Juanita Craver, MD;  [facility];  Service: Endoscopy;  Laterality: N/A;  . CORONARY ARTERY BYPASS GRAFT N/A 03/25/2017   Procedure: CORONARY ARTERY BYPASS GRAFTING (CABG) x 2 using left internal mammary artery and right greater saphenous leg vein using endosciope.;  Surgeon: [name], MD;  Location: [facility];  Service: Open Heart Surgery;  Laterality: N/A;  . CYSTOSCOPY W/ RETROGRADES Left 03/26/2015   Procedure: CYSTOSCOPY WITH RETROGRADE PYELOGRAM;  Surgeon: [name], MD;  [facility];  Service: Urology;  Laterality: Left;  . ICD IMPLANT N/A 12/15/2017   Procedure: ICD IMPLANT;  Surgeon: [name], MD;  Location: [facility];  Service: Cardiovascular;  Laterality: N/A;  . LEFT HEART CATH AND CORONARY ANGIOGRAPHY N/A 03/25/2017   Procedure: Left Heart Cath and Coronary Angiography;  Surgeon: [name], MD;  Location: [facility];  Service:  Cardiovascular;  Laterality: N/A;  . PLACEMENT OF IMPELLA LEFT VENTRICULAR ASSIST DEVICE  03/25/2017   Procedure: PLACEMENT OF IMPELLA LEFT VENTRICULAR ASSIST DEVICE;  Surgeon: [name], MD;  Location: [facility];  Service: Open Heart Surgery;;  . REMOVAL OF IMPELLA LEFT VENTRICULAR ASSIST DEVICE N/A 03/31/2017   Procedure: REMOVAL OF IMPELLA LEFT VENTRICULAR ASSIST DEVICE;  Surgeon: [name], MD;  Location: [facility];  Service: Open Heart Surgery;  Laterality: N/A;  . ROBOT ASSISTED PYELOPLASTY Left 03/26/2015   Procedure: ROBOTIC ASSISTED PYELOPLASTY WITH STENT PLACEMENT, REMOVAL OF STONE AND CYST DECORTICATION;  Surgeon: [name], MD;  [facility];  Service: Urology;  Laterality: Left;  . TEE WITHOUT CARDIOVERSION N/A 03/25/2017   Procedure: TRANSESOPHAGEAL ECHOCARDIOGRAM (TEE);  Surgeon: [name], [name], MD;  Location: [facility];  Service: Open Heart Surgery;  Laterality: N/A;  . TEE WITHOUT CARDIOVERSION N/A  03/31/2017   Procedure: TRANSESOPHAGEAL ECHOCARDIOGRAM (TEE);  Surgeon: [name], [name], MD;  Location: [facility];  Service: Open Heart Surgery;  Laterality: N/A;   Social History   Occupational History  . Not on file  Tobacco Use  . Smoking status: Never Smoker  . Smokeless tobacco: Never Used  Vaping Use  . Vaping Use: Never used  Substance and Sexual Activity  . Alcohol use: No  . Drug use: No  . Sexual activity: Not on file

## 2020-08-06 ENCOUNTER — Ambulatory Visit (INDEPENDENT_AMBULATORY_CARE_PROVIDER_SITE_OTHER): Payer: Medicare Other | Admitting: Physical Therapy

## 2020-08-06 ENCOUNTER — Other Ambulatory Visit: Payer: Self-pay

## 2020-08-06 ENCOUNTER — Encounter: Payer: Self-pay | Admitting: Physical Therapy

## 2020-08-06 DIAGNOSIS — M25561 Pain in right knee: Secondary | ICD-10-CM

## 2020-08-06 DIAGNOSIS — R2689 Other abnormalities of gait and mobility: Secondary | ICD-10-CM

## 2020-08-06 DIAGNOSIS — M545 Low back pain, unspecified: Secondary | ICD-10-CM | POA: Diagnosis not present

## 2020-08-06 DIAGNOSIS — M25562 Pain in left knee: Secondary | ICD-10-CM

## 2020-08-06 DIAGNOSIS — M6281 Muscle weakness (generalized): Secondary | ICD-10-CM

## 2020-08-06 DIAGNOSIS — G8929 Other chronic pain: Secondary | ICD-10-CM

## 2020-08-06 NOTE — Patient Instructions (Signed)
Access Code: [email or web address] URL: [email or web address] Date: 08/06/2020 Prepared by: Elsie Ra  Exercises Supine Lower Trunk Rotation - 2 x daily - 6 x weekly - 1 sets - 10 reps - 5 sec hold Supine Single Knee to Chest Stretch - 2 x daily - 6 x weekly - 1 sets - 2 reps - 30 hold Supine Quadriceps Stretch with Strap on Table - 2 x daily - 6 x weekly - 2-3 reps - 30 hold Supine Bridge - 2 x daily - 6 x weekly - 1-2 sets - 10 reps - 5 hold Mini Squat with Counter Support - 2 x daily - 6 x weekly - 2-3 sets - 10 reps

## 2020-08-06 NOTE — Therapy (Signed)
[facility] Physical Therapy [address] [place], [place], [address] Phone: [phone]   Fax:  [phone]  Physical Therapy Evaluation  Patient Details  Name: Luis [name] MRN: [id number] Date of Birth: [birth year] Referring Provider (PT): Marlou Sa Tonna Corner, MD   Encounter Date: 08/06/2020   PT End of Session - 08/06/20 1445    Visit Number 1    Number of Visits 12    Date for PT Re-Evaluation 09/17/20    Authorization Type BCBS MCR    Progress Note Due on Visit 10    PT Start Time 1696    PT Stop Time 1425    PT Time Calculation (min) 40 min    Activity Tolerance Patient tolerated treatment well    Behavior During Therapy [facility] for tasks assessed/performed           Past Medical History:  Diagnosis Date  . AICD (automatic cardioverter/defibrillator) present 12/15/2017  . AKI (acute kidney injury) ([place]) 03/28/2017  . Cardiogenic shock ([place]) 03/28/2017  . Chronic combined systolic and diastolic heart failure ([place]) 05/20/2019  . Chronic kidney disease    kidney stones; noted per H&P per Dr [name] 02/06/2015 chronic kidney disease stage II  . Dyslipidemia   . Encounter for assessment of implantable cardioverter-defibrillator (ICD) 05/20/2019  . History of hepatomegaly    with fatty liver discussed per H&P per Dr [name] 02/06/2015 secondary to abd ultrasound   . History of kidney stones   . LVAD (left ventricular assist device) present ([place])   . Pneumothorax on right   . Sleep apnea    AHI 17 on sleep study 08/23/2013 pt states was given CPAP machine but sent it back - does not use now 12/15/2017  . 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  . Vitamin D deficiency    as noted per H&P per Dr [name] 02/06/2015    Past Surgical History:  Procedure Laterality Date  . ASD REPAIR N/A 03/25/2017   Procedure: ATRIAL SEPTAL DEFECT (ASD) REPAIR;  Surgeon: [name], MD;  Location: [place];  Service: Open Heart Surgery;  Laterality: N/A;  .  BIOPSY  03/10/2019   Procedure: BIOPSY;  Surgeon: Juanita Craver, MD;  [facility];  Service: Endoscopy;;  . CARDIAC DEFIBRILLATOR PLACEMENT  12/15/2017  . COLONOSCOPY WITH PROPOFOL N/A 03/10/2019   Procedure: COLONOSCOPY WITH PROPOFOL;  Surgeon: Juanita Craver, MD;  [facility];  Service: Endoscopy;  Laterality: N/A;  . CORONARY ARTERY BYPASS GRAFT N/A 03/25/2017   Procedure: CORONARY ARTERY BYPASS GRAFTING (CABG) x 2 using left internal mammary artery and right greater saphenous leg vein using endosciope.;  Surgeon: [name], MD;  Location: [place];  Service: Open Heart Surgery;  Laterality: N/A;  . CYSTOSCOPY W/ RETROGRADES Left 03/26/2015   Procedure: CYSTOSCOPY WITH RETROGRADE PYELOGRAM;  Surgeon: [name], MD;  [facility];  Service: Urology;  Laterality: Left;  . ICD IMPLANT N/A 12/15/2017   Procedure: ICD IMPLANT;  Surgeon: [name], MD;  Location: [place] [facility];  Service: Cardiovascular;  Laterality: N/A;  . LEFT HEART CATH AND CORONARY ANGIOGRAPHY N/A 03/25/2017   Procedure: Left Heart Cath and Coronary Angiography;  Surgeon: [name], MD;  Location: [place] [facility];  Service: Cardiovascular;  Laterality: N/A;  . PLACEMENT OF IMPELLA LEFT VENTRICULAR ASSIST DEVICE  03/25/2017   Procedure: PLACEMENT OF IMPELLA LEFT VENTRICULAR ASSIST DEVICE;  Surgeon: [name], MD;  Location: [place];  Service: Open Heart Surgery;;  . REMOVAL OF IMPELLA  LEFT VENTRICULAR ASSIST DEVICE N/A 03/31/2017   Procedure: REMOVAL OF IMPELLA LEFT VENTRICULAR ASSIST DEVICE;  Surgeon: [name], MD;  Location: [place];  Service: Open Heart Surgery;  Laterality: N/A;  . ROBOT ASSISTED PYELOPLASTY Left 03/26/2015   Procedure: ROBOTIC ASSISTED PYELOPLASTY WITH STENT PLACEMENT, REMOVAL OF STONE AND CYST DECORTICATION;  Surgeon: [name], MD;  [facility];  Service: Urology;  Laterality: Left;  . TEE WITHOUT CARDIOVERSION N/A 03/25/2017   Procedure:  TRANSESOPHAGEAL ECHOCARDIOGRAM (TEE);  Surgeon: [name], [name], MD;  Location: [place];  Service: Open Heart Surgery;  Laterality: N/A;  . TEE WITHOUT CARDIOVERSION N/A 03/31/2017   Procedure: TRANSESOPHAGEAL ECHOCARDIOGRAM (TEE);  Surgeon: [name], [name], MD;  Location: [place];  Service: Open Heart Surgery;  Laterality: N/A;    There were no vitals filed for this visit.    Subjective Assessment - 08/06/20 1350    Subjective LBP and bilat knee pain, Has on and off low back pain without radiation more than a few years now.    Limitations House hold activities    Diagnostic tests lumbar XR "mild facet degen changes, bilat knee XR negative    Patient Stated Goals reduce pain    Currently in Pain? Yes    Pain Score 6     Pain Location Back   and bilat knees   Pain Orientation Right;Left    Pain Descriptors / Indicators Aching    Pain Type Chronic pain    Pain Radiating Towards denies radiculopathy    Pain Onset More than a month ago    Pain Frequency Constant    Aggravating Factors  getting down on the floor, bending    Pain Relieving Factors laying down, heat              [facility] PT Assessment - 08/06/20 0001      Assessment   Medical Diagnosis LBP and bilat knee pain    Referring Provider (PT) Marlou Sa Tonna Corner, MD    Onset Date/Surgical Date --   chronic pain more than a few years   Next MD Visit nothing scheduled    Prior Therapy nothing recent      Precautions   Precautions None      Restrictions   Other Position/Activity Restrictions patient reports he does not lift anything due to back and heart conditions      Balance Screen   Has the patient fallen in the past 6 months No    Has the patient had a decrease in activity level because of a fear of falling?  No    Is the patient reluctant to leave their home because of a fear of falling?  No      Prior Function   Level of Independence Independent    Vocation Retired    Leisure none reported when asked       Cognition   Overall Cognitive Status Within Functional Limits for tasks assessed      ROM / Strength   AROM / PROM / Strength AROM;Strength      AROM   Overall AROM Comments bilat knee ROM WNL    AROM Assessment Site Lumbar;Knee    Right/Left Knee Right;Left    Lumbar Flexion 50%    Lumbar Extension 75%    Lumbar - Right Side Bend 50%    Lumbar - Left Side Bend 50%    Lumbar - Right Rotation 50%    Lumbar - Left Rotation 50%  Strength   Overall Strength Comments tested in sitting    Strength Assessment Site Hip;Knee;Ankle    Right/Left Hip Right;Left    Right Hip Flexion 4+/5    Right Hip ABduction 4+/5    Left Hip Flexion 4-/5    Left Hip Extension 4/5    Right/Left Knee Right;Left    Right Knee Flexion 5/5    Right Knee Extension 4+/5    Left Knee Flexion 4+/5    Left Knee Extension 4-/5    Right/Left Ankle Right;Left    Right Ankle Dorsiflexion 5/5    Right Ankle Plantar Flexion 5/5    Left Ankle Dorsiflexion 5/5      Flexibility   Soft Tissue Assessment /Muscle Length --   mild to mod tighness in [place], quads, lumbar P.S.     Special Tests   Other special tests neg slump test bilat, SLR test mostly negative in that some back pain but no radiculopathy from this. Some pain and hypomobiliity with central PA mobs L3-5 with most pain at L4. STM restricitons noted in lumbar paraspinals                      Objective measurements completed on examination: See above findings.       [place] Adult PT Treatment/Exercise - 08/06/20 0001      Modalities   Modalities Moist Heat      Moist Heat Therapy   Number Minutes Moist Heat 10 Minutes    Moist Heat Location Lumbar Spine   in prone     Manual Therapy   Manual therapy comments central PA mobs to L4                  PT Education - 08/06/20 1444    Education Details HEP, POC    Person(s) Educated Patient    Methods Explanation;Demonstration;Verbal cues;Handout    Comprehension Verbalized  understanding;Need further instruction            PT Short Term Goals - 08/06/20 1454      PT SHORT TERM GOAL #1   Title independent with initial HEP    Time 4    Period Weeks    Status New             PT Long Term Goals - 08/06/20 1452      PT LONG TERM GOAL #1   Title Pt will be I and compliant with HEP progression.    Time 6    Period Weeks    Status New      PT LONG TERM GOAL #2   Title Decrease overall pain to less than 4/10 with usual activity    Baseline 6    Time 6    Period Weeks    Status New      PT LONG TERM GOAL #3   Title Pt will improve leg strength to overall 4+/5    Time 6    Period Weeks    Status New      PT LONG TERM GOAL #4   Title Pt will improve lumbar ROM to to [facility].    Time 6    Period Weeks    Status New                  Plan - 08/06/20 1447    Clinical Impression Statement Pt presents with chronic LBP without radiculopathy and chronic bilat knee pain. MD recommending PT eval and treat bilat  quad strengthening and LBP core stretching and strengthening for 2x/wk for 6wk. He will benefit from skilled PT to address his deficits in spinal mobiliy, lumbar ROM, core/leg weakness, and activity tolerance for bending and stairs.    Personal Factors and Comorbidities Comorbidity 3+;Time since onset of injury/illness/exacerbation;Past/Current Experience    Comorbidities PMH: heart failure,debibrillator, S/P CABG  blood thinners,    Examination-Activity Limitations Squat;Stairs;Lift    Examination-Participation Restrictions Cleaning;Driving;Community Activity;Laundry;Yard Work;Shop    Stability/Clinical Decision Making Evolving/Moderate complexity    Clinical Decision Making Moderate    Rehab Potential Good    PT Frequency 2x / week    PT Duration 6 weeks    PT Treatment/Interventions ADLs/Self Care Home Management;Cryotherapy;Moist Heat;Traction;Stair training;Therapeutic activities;Therapeutic exercise;Balance training;Neuromuscular  re-education;Manual techniques;Passive range of motion;Dry needling;Joint Manipulations;Spinal Manipulations;Taping    PT Next Visit Plan no electric modalaties due to defibrilator. needs core and quad strength. lumbar stretching.    PT Home Exercise Plan Access Code: [email or web address]    Consulted and Agree with Plan of Care Patient           Patient will benefit from skilled therapeutic intervention in order to improve the following deficits and impairments:  Decreased activity tolerance, Decreased balance, Decreased range of motion, Decreased strength, Difficulty walking, Hypomobility, Postural dysfunction, Impaired flexibility, Improper body mechanics, Increased muscle spasms, Increased fascial restricitons, Pain  Visit Diagnosis: Chronic bilateral low back pain without sciatica  Chronic pain of left knee  Chronic pain of right knee  Muscle weakness (generalized)  Other abnormalities of gait and mobility     Problem List Patient Active Problem List   Diagnosis Date Noted  . Chronic combined systolic and diastolic heart failure ([place]) 05/20/2019  . Encounter for assessment of implantable cardioverter-defibrillator (ICD) 05/20/2019  . Ischemic cardiomyopathy 12/15/2017  . ICD: Medtronic  single chamber Visia AF MRI VR [id number] ICD -  12/15/2017  12/15/2017  . Dyspnea 04/16/2017  . Acute combined systolic and diastolic heart failure ([place]) 04/16/2017  . 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 Acute respiratory failure with hypoxia ([place]) 03/28/2017  . Coronary artery disease 03/25/2017    Silvestre Mesi 08/06/2020, 2:57 PM  [facility] Physical Therapy [address] [place], [place], [address] Phone: [phone]   Fax:  [phone]  Name: Luis [name] MRN: [id number] Date of Birth: 1945-11-12

## 2020-08-08 ENCOUNTER — Other Ambulatory Visit: Payer: Self-pay

## 2020-08-08 ENCOUNTER — Ambulatory Visit: Payer: Medicare Other | Admitting: Physical Therapy

## 2020-08-08 DIAGNOSIS — M545 Low back pain, unspecified: Secondary | ICD-10-CM | POA: Diagnosis not present

## 2020-08-08 DIAGNOSIS — M25562 Pain in left knee: Secondary | ICD-10-CM

## 2020-08-08 DIAGNOSIS — G8929 Other chronic pain: Secondary | ICD-10-CM

## 2020-08-08 DIAGNOSIS — R2689 Other abnormalities of gait and mobility: Secondary | ICD-10-CM

## 2020-08-08 DIAGNOSIS — M25561 Pain in right knee: Secondary | ICD-10-CM

## 2020-08-08 DIAGNOSIS — M6281 Muscle weakness (generalized): Secondary | ICD-10-CM | POA: Diagnosis not present

## 2020-08-08 NOTE — Therapy (Signed)
[facility] Physical Therapy [address] [place], [place], [address] Phone: [phone]   Fax:  [phone]  Physical Therapy Treatment  Patient Details  Name: Luis [name] MRN: [id number] Date of Birth: [birth year] Referring Provider (PT): Marlou Sa Tonna Corner, MD   Encounter Date: 08/08/2020   PT End of Session - 08/08/20 0834    Visit Number 2    Number of Visits 12    Date for PT Re-Evaluation 09/17/20    Authorization Type BCBS MCR    Progress Note Due on Visit 10    PT Start Time 0832    PT Stop Time 0915    PT Time Calculation (min) 43 min    Activity Tolerance Patient tolerated treatment well    Behavior During Therapy [facility] for tasks assessed/performed           Past Medical History:  Diagnosis Date  . AICD (automatic cardioverter/defibrillator) present 12/15/2017  . AKI (acute kidney injury) ([place]) 03/28/2017  . Cardiogenic shock ([place]) 03/28/2017  . Chronic combined systolic and diastolic heart failure ([place]) 05/20/2019  . Chronic kidney disease    kidney stones; noted per H&P per Dr [name] 02/06/2015 chronic kidney disease stage II  . Dyslipidemia   . Encounter for assessment of implantable cardioverter-defibrillator (ICD) 05/20/2019  . History of hepatomegaly    with fatty liver discussed per H&P per Dr [name] 02/06/2015 secondary to abd ultrasound   . History of kidney stones   . LVAD (left ventricular assist device) present ([place])   . Pneumothorax on right   . Sleep apnea    AHI 17 on sleep study 08/23/2013 pt states was given CPAP machine but sent it back - does not use now 12/15/2017  . 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  . Vitamin D deficiency    as noted per H&P per Dr [name] 02/06/2015    Past Surgical History:  Procedure Laterality Date  . ASD REPAIR N/A 03/25/2017   Procedure: ATRIAL SEPTAL DEFECT (ASD) REPAIR;  Surgeon: [name], MD;  Location: [place];  Service: Open Heart Surgery;  Laterality: N/A;  .  BIOPSY  03/10/2019   Procedure: BIOPSY;  Surgeon: Juanita Craver, MD;  [facility];  Service: Endoscopy;;  . CARDIAC DEFIBRILLATOR PLACEMENT  12/15/2017  . COLONOSCOPY WITH PROPOFOL N/A 03/10/2019   Procedure: COLONOSCOPY WITH PROPOFOL;  Surgeon: Juanita Craver, MD;  [facility];  Service: Endoscopy;  Laterality: N/A;  . CORONARY ARTERY BYPASS GRAFT N/A 03/25/2017   Procedure: CORONARY ARTERY BYPASS GRAFTING (CABG) x 2 using left internal mammary artery and right greater saphenous leg vein using endosciope.;  Surgeon: [name], MD;  Location: [place];  Service: Open Heart Surgery;  Laterality: N/A;  . CYSTOSCOPY W/ RETROGRADES Left 03/26/2015   Procedure: CYSTOSCOPY WITH RETROGRADE PYELOGRAM;  Surgeon: [name], MD;  [facility];  Service: Urology;  Laterality: Left;  . ICD IMPLANT N/A 12/15/2017   Procedure: ICD IMPLANT;  Surgeon: [name], MD;  Location: [place] [facility];  Service: Cardiovascular;  Laterality: N/A;  . LEFT HEART CATH AND CORONARY ANGIOGRAPHY N/A 03/25/2017   Procedure: Left Heart Cath and Coronary Angiography;  Surgeon: [name], MD;  Location: [place] [facility];  Service: Cardiovascular;  Laterality: N/A;  . PLACEMENT OF IMPELLA LEFT VENTRICULAR ASSIST DEVICE  03/25/2017   Procedure: PLACEMENT OF IMPELLA LEFT VENTRICULAR ASSIST DEVICE;  Surgeon: [name], MD;  Location: [place];  Service: Open Heart Surgery;;  . REMOVAL OF IMPELLA  LEFT VENTRICULAR ASSIST DEVICE N/A 03/31/2017   Procedure: REMOVAL OF IMPELLA LEFT VENTRICULAR ASSIST DEVICE;  Surgeon: [name], MD;  Location: [place];  Service: Open Heart Surgery;  Laterality: N/A;  . ROBOT ASSISTED PYELOPLASTY Left 03/26/2015   Procedure: ROBOTIC ASSISTED PYELOPLASTY WITH STENT PLACEMENT, REMOVAL OF STONE AND CYST DECORTICATION;  Surgeon: [name], MD;  [facility];  Service: Urology;  Laterality: Left;  . TEE WITHOUT CARDIOVERSION N/A 03/25/2017   Procedure:  TRANSESOPHAGEAL ECHOCARDIOGRAM (TEE);  Surgeon: [name], [name], MD;  Location: [place];  Service: Open Heart Surgery;  Laterality: N/A;  . TEE WITHOUT CARDIOVERSION N/A 03/31/2017   Procedure: TRANSESOPHAGEAL ECHOCARDIOGRAM (TEE);  Surgeon: [name], [name], MD;  Location: [place];  Service: Open Heart Surgery;  Laterality: N/A;    There were no vitals filed for this visit.   Subjective Assessment - 08/08/20 0834    Subjective Pt arriving to therapy reporting 6/10 low back pain. Pt reporting compliance with his HEP.    Limitations House hold activities    Diagnostic tests lumbar XR "mild facet degen changes, bilat knee XR negative    Patient Stated Goals reduce pain    Currently in Pain? Yes    Pain Score 6     Pain Location Back    Pain Descriptors / Indicators Aching    Pain Type Chronic pain    Pain Onset More than a month ago                             [facility] Adult PT Treatment/Exercise - 08/08/20 0001      Exercises   Exercises Lumbar      Lumbar Exercises: Stretches   Active Hamstring Stretch Right;Left;2 reps;30 seconds    Figure 4 Stretch 3 reps;20 seconds;Without overpressure      Lumbar Exercises: Aerobic   Nustep Nustep: L5 x 7 minutes      Lumbar Exercises: Seated   Sit to Stand Limitations 10 x using UE support      Lumbar Exercises: Supine   Bridge 10 reps;5 seconds    Bridge Limitations 2 sets    Straight Leg Raise 10 reps;2 seconds    Straight Leg Raises Limitations 2 sets      Lumbar Exercises: Sidelying   Clam Both;10 reps;Limitations    Clam Limitations 2 sets, yellow Theraband      Modalities   Modalities Moist Heat      Moist Heat Therapy   Number Minutes Moist Heat 8 Minutes    Moist Heat Location Lumbar Spine      Manual Therapy   Manual therapy comments PA lumbar mobs L2-L4                    PT Short Term Goals - 08/08/20 0846      PT SHORT TERM GOAL #1   Title independent with initial HEP    Status  On-going             PT Long Term Goals - 08/08/20 0846      PT LONG TERM GOAL #1   Title Pt will be I and compliant with HEP progression.    Status On-going      PT LONG TERM GOAL #2   Title Decrease overall pain to less than 4/10 with usual activity    Status On-going      PT LONG TERM GOAL #3   Title  Pt will improve leg strength to overall 4+/5    Status On-going      PT LONG TERM GOAL #4   Title Pt will improve lumbar ROM to to [facility].    Status On-going      PT LONG TERM GOAL #5   Title increase active left shoulder IR to 60 degrees    Status On-going                 Plan - 08/08/20 0843    Clinical Impression Statement Pt tolerating exercises well with mild fatigue noted. No reports of increased pain during session. Focusing on core strength and activation and LE strengthening. Continue skilled PT.    Personal Factors and Comorbidities Comorbidity 3+;Time since onset of injury/illness/exacerbation;Past/Current Experience    Comorbidities PMH: heart failure,debibrillator, S/P CABG  blood thinners,    Examination-Activity Limitations Squat;Stairs;Lift    Examination-Participation Restrictions Cleaning;Driving;Community Activity;Laundry;Yard Work;Shop    Stability/Clinical Decision Making Evolving/Moderate complexity    Rehab Potential Good    PT Frequency 2x / week    PT Treatment/Interventions ADLs/Self Care Home Management;Cryotherapy;Moist Heat;Traction;Stair training;Therapeutic activities;Therapeutic exercise;Balance training;Neuromuscular re-education;Manual techniques;Passive range of motion;Dry needling;Joint Manipulations;Spinal Manipulations;Taping    PT Next Visit Plan no electric modalaties due to defibrilator. needs core and quad strength. lumbar stretching.    PT Home Exercise Plan Access Code: [email or web address]           Patient will benefit from skilled therapeutic intervention in order to improve the following deficits and impairments:  Decreased  activity tolerance, Decreased balance, Decreased range of motion, Decreased strength, Difficulty walking, Hypomobility, Postural dysfunction, Impaired flexibility, Improper body mechanics, Increased muscle spasms, Increased fascial restricitons, Pain  Visit Diagnosis: Chronic bilateral low back pain without sciatica  Chronic pain of left knee  Chronic pain of right knee  Muscle weakness (generalized)  Other abnormalities of gait and mobility     Problem List Patient Active Problem List   Diagnosis Date Noted  . Chronic combined systolic and diastolic heart failure ([place]) 05/20/2019  . Encounter for assessment of implantable cardioverter-defibrillator (ICD) 05/20/2019  . Ischemic cardiomyopathy 12/15/2017  . ICD: Medtronic  single chamber Visia AF MRI VR [id number] ICD -  12/15/2017  12/15/2017  . Dyspnea 04/16/2017  . Acute combined systolic and diastolic heart failure ([place]) 04/16/2017  . 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 Acute respiratory failure with hypoxia ([place]) 03/28/2017  . Coronary artery disease 03/25/2017    Oretha Caprice, PT, MTP 08/08/2020, 9:07 AM  [facility] Physical Therapy [address] [place], [place], [address] Phone: [phone]   Fax:  [phone]  Name: Luis [name] MRN: [id number] Date of Birth: Mar 30, 1946

## 2020-08-15 ENCOUNTER — Ambulatory Visit: Payer: Medicare Other | Admitting: Physical Therapy

## 2020-08-20 ENCOUNTER — Other Ambulatory Visit: Payer: Self-pay

## 2020-08-20 ENCOUNTER — Ambulatory Visit: Payer: Medicare Other | Admitting: Physical Therapy

## 2020-08-20 ENCOUNTER — Encounter: Payer: Self-pay | Admitting: Physical Therapy

## 2020-08-20 DIAGNOSIS — M545 Low back pain, unspecified: Secondary | ICD-10-CM

## 2020-08-20 DIAGNOSIS — M25562 Pain in left knee: Secondary | ICD-10-CM | POA: Diagnosis not present

## 2020-08-20 DIAGNOSIS — M25561 Pain in right knee: Secondary | ICD-10-CM

## 2020-08-20 DIAGNOSIS — R2689 Other abnormalities of gait and mobility: Secondary | ICD-10-CM

## 2020-08-20 DIAGNOSIS — G8929 Other chronic pain: Secondary | ICD-10-CM

## 2020-08-20 DIAGNOSIS — M6281 Muscle weakness (generalized): Secondary | ICD-10-CM

## 2020-08-20 NOTE — Therapy (Signed)
[facility] Physical Therapy [address] [place], [place], [address] Phone: [phone]   Fax:  [phone]  Physical Therapy Treatment  Patient Details  Name: Luis [name] MRN: [id number] Date of Birth: [birth year] Referring Provider (PT): Marlou Sa Tonna Corner, MD   Encounter Date: 08/20/2020   PT End of Session - 08/20/20 1152    Visit Number 3    Number of Visits 12    Date for PT Re-Evaluation 09/17/20    Authorization Type BCBS MCR    PT Start Time 9024    PT Stop Time 1230    PT Time Calculation (min) 45 min    Activity Tolerance Patient tolerated treatment well    Behavior During Therapy [facility] for tasks assessed/performed           Past Medical History:  Diagnosis Date  . AICD (automatic cardioverter/defibrillator) present 12/15/2017  . AKI (acute kidney injury) ([place]) 03/28/2017  . Cardiogenic shock ([place]) 03/28/2017  . Chronic combined systolic and diastolic heart failure ([place]) 05/20/2019  . Chronic kidney disease    kidney stones; noted per H&P per Dr [name] 02/06/2015 chronic kidney disease stage II  . Dyslipidemia   . Encounter for assessment of implantable cardioverter-defibrillator (ICD) 05/20/2019  . History of hepatomegaly    with fatty liver discussed per H&P per Dr [name] 02/06/2015 secondary to abd ultrasound   . History of kidney stones   . LVAD (left ventricular assist device) present ([place])   . Pneumothorax on right   . Sleep apnea    AHI 17 on sleep study 08/23/2013 pt states was given CPAP machine but sent it back - does not use now 12/15/2017  . 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  . Vitamin D deficiency    as noted per H&P per Dr [name] 02/06/2015    Past Surgical History:  Procedure Laterality Date  . ASD REPAIR N/A 03/25/2017   Procedure: ATRIAL SEPTAL DEFECT (ASD) REPAIR;  Surgeon: [name], MD;  Location: [place];  Service: Open Heart Surgery;  Laterality: N/A;  . BIOPSY  03/10/2019   Procedure: BIOPSY;   Surgeon: Juanita Craver, MD;  [facility];  Service: Endoscopy;;  . CARDIAC DEFIBRILLATOR PLACEMENT  12/15/2017  . COLONOSCOPY WITH PROPOFOL N/A 03/10/2019   Procedure: COLONOSCOPY WITH PROPOFOL;  Surgeon: Juanita Craver, MD;  [facility];  Service: Endoscopy;  Laterality: N/A;  . CORONARY ARTERY BYPASS GRAFT N/A 03/25/2017   Procedure: CORONARY ARTERY BYPASS GRAFTING (CABG) x 2 using left internal mammary artery and right greater saphenous leg vein using endosciope.;  Surgeon: [name], MD;  Location: [place];  Service: Open Heart Surgery;  Laterality: N/A;  . CYSTOSCOPY W/ RETROGRADES Left 03/26/2015   Procedure: CYSTOSCOPY WITH RETROGRADE PYELOGRAM;  Surgeon: [name], MD;  [facility];  Service: Urology;  Laterality: Left;  . ICD IMPLANT N/A 12/15/2017   Procedure: ICD IMPLANT;  Surgeon: [name], MD;  Location: [place] [facility];  Service: Cardiovascular;  Laterality: N/A;  . LEFT HEART CATH AND CORONARY ANGIOGRAPHY N/A 03/25/2017   Procedure: Left Heart Cath and Coronary Angiography;  Surgeon: [name], MD;  Location: [place] [facility];  Service: Cardiovascular;  Laterality: N/A;  . PLACEMENT OF IMPELLA LEFT VENTRICULAR ASSIST DEVICE  03/25/2017   Procedure: PLACEMENT OF IMPELLA LEFT VENTRICULAR ASSIST DEVICE;  Surgeon: [name], MD;  Location: [place];  Service: Open Heart Surgery;;  . REMOVAL OF IMPELLA LEFT VENTRICULAR ASSIST DEVICE N/A 03/31/2017   Procedure:  REMOVAL OF IMPELLA LEFT VENTRICULAR ASSIST DEVICE;  Surgeon: [name], MD;  Location: [place];  Service: Open Heart Surgery;  Laterality: N/A;  . ROBOT ASSISTED PYELOPLASTY Left 03/26/2015   Procedure: ROBOTIC ASSISTED PYELOPLASTY WITH STENT PLACEMENT, REMOVAL OF STONE AND CYST DECORTICATION;  Surgeon: [name], MD;  [facility];  Service: Urology;  Laterality: Left;  . TEE WITHOUT CARDIOVERSION N/A 03/25/2017   Procedure: TRANSESOPHAGEAL ECHOCARDIOGRAM (TEE);   Surgeon: [name], [name], MD;  Location: [place];  Service: Open Heart Surgery;  Laterality: N/A;  . TEE WITHOUT CARDIOVERSION N/A 03/31/2017   Procedure: TRANSESOPHAGEAL ECHOCARDIOGRAM (TEE);  Surgeon: [name], [name], MD;  Location: [place];  Service: Open Heart Surgery;  Laterality: N/A;    There were no vitals filed for this visit.   Subjective Assessment - 08/20/20 1151    Subjective Pt arriving to therapy reporting 6/10 low back pain. Pt reporting his HEP is going well.    Limitations [place] hold activities    Diagnostic tests lumbar XR "mild facet degen changes, bilat knee XR negative    Patient Stated Goals reduce pain    Currently in Pain? Yes    Pain Score 6     Pain Orientation Right;Left    Pain Descriptors / Indicators Aching    Pain Type Chronic pain    Pain Onset More than a month ago                             [facility] Adult PT Treatment/Exercise - 08/20/20 0001      Exercises   Exercises Lumbar      Lumbar Exercises: Stretches   Active Hamstring Stretch Right;Left;2 reps;30 seconds    Single Knee to Chest Stretch Right;Left;2 reps;20 seconds    Figure 4 Stretch 3 reps;20 seconds;Without overpressure      Lumbar Exercises: Aerobic   Nustep Nustep: L5 x 8 minutes      Lumbar Exercises: Seated   Sit to Stand Limitations 10 x using UE support      Lumbar Exercises: Supine   Ab Set 5 reps;3 seconds    Bridge 10 reps;5 seconds    Bridge Limitations 2 sets    Straight Leg Raise 10 reps;2 seconds    Straight Leg Raises Limitations 2 sets      Lumbar Exercises: Sidelying   Clam Both;10 reps;Limitations    Clam Limitations 2 sets, red Theraband      Modalities   Modalities Moist Heat      Moist Heat Therapy   Number Minutes Moist Heat 5 Minutes    Moist Heat Location Lumbar Spine      Manual Therapy   Manual therapy comments PA lumbar mobs L2-L4, soft tissue mobs to lumbar paraspinals                    PT Short Term Goals -  08/08/20 0846      PT SHORT TERM GOAL #1   Title independent with initial HEP    Status On-going             PT Long Term Goals - 08/20/20 1152      PT LONG TERM GOAL #1   Title Pt will be I and compliant with HEP progression.    Period Weeks    Status On-going      PT LONG TERM GOAL #2   Title Decrease overall pain to less than  4/10 with usual activity    Status On-going      PT LONG TERM GOAL #3   Title Pt will improve leg strength to overall 4+/5    Status On-going      PT LONG TERM GOAL #4   Title Pt will improve lumbar ROM to to [facility].    Status On-going                 Plan - 08/20/20 1159    Clinical Impression Statement Pt tolerating all exericses well with no reports of increased pain. Pt still reporting mild fatigue in his low back. Pt progressing toward improved mobility and core strengthening/ LE strengthening. Continue skilled PT progressing toward goals set.    Personal Factors and Comorbidities Comorbidity 3+;Time since onset of injury/illness/exacerbation;Past/Current Experience    Comorbidities PMH: heart failure,debibrillator, S/P CABG  blood thinners,    Examination-Participation Restrictions Cleaning;Driving;Community Activity;Laundry;Yard Work;Shop    Stability/Clinical Decision Making Evolving/Moderate complexity    Rehab Potential Good    PT Frequency 2x / week    PT Treatment/Interventions ADLs/Self Care Home Management;Cryotherapy;Moist Heat;Traction;Stair training;Therapeutic activities;Therapeutic exercise;Balance training;Neuromuscular re-education;Manual techniques;Passive range of motion;Dry needling;Joint Manipulations;Spinal Manipulations;Taping    PT Next Visit Plan no electric modalaties due to defibrilator. needs core and quad strength. lumbar stretching.    PT Home Exercise Plan Access Code: [email or web address]    Consulted and Agree with Plan of Care Patient           Patient will benefit from skilled therapeutic intervention in order to  improve the following deficits and impairments:  Decreased activity tolerance, Decreased balance, Decreased range of motion, Decreased strength, Difficulty walking, Hypomobility, Postural dysfunction, Impaired flexibility, Improper body mechanics, Increased muscle spasms, Increased fascial restricitons, Pain  Visit Diagnosis: Chronic bilateral low back pain without sciatica  Chronic pain of left knee  Muscle weakness (generalized)  Other abnormalities of gait and mobility  Chronic pain of right knee     Problem List Patient Active Problem List   Diagnosis Date Noted  . Chronic combined systolic and diastolic heart failure ([place]) 05/20/2019  . Encounter for assessment of implantable cardioverter-defibrillator (ICD) 05/20/2019  . Ischemic cardiomyopathy 12/15/2017  . ICD: Medtronic  single chamber Visia AF MRI VR [id number] ICD -  12/15/2017  12/15/2017  . Dyspnea 04/16/2017  . Acute combined systolic and diastolic heart failure ([place]) 04/16/2017  . 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 Acute respiratory failure with hypoxia ([place]) 03/28/2017  . Coronary artery disease 03/25/2017    Oretha Caprice, PT, MPT 08/20/2020, 12:05 PM  [facility] Physical Therapy [address] [place], [place], [address] Phone: [phone]   Fax:  [phone]  Name: Luis [name] MRN: [id number] Date of Birth: 1946/09/13

## 2020-08-27 ENCOUNTER — Other Ambulatory Visit: Payer: Self-pay

## 2020-08-27 ENCOUNTER — Encounter: Payer: Self-pay | Admitting: Physical Therapy

## 2020-08-27 ENCOUNTER — Ambulatory Visit: Payer: Medicare Other | Admitting: Physical Therapy

## 2020-08-27 DIAGNOSIS — M545 Low back pain, unspecified: Secondary | ICD-10-CM

## 2020-08-27 DIAGNOSIS — M6281 Muscle weakness (generalized): Secondary | ICD-10-CM | POA: Diagnosis not present

## 2020-08-27 DIAGNOSIS — M25562 Pain in left knee: Secondary | ICD-10-CM | POA: Diagnosis not present

## 2020-08-27 DIAGNOSIS — M25561 Pain in right knee: Secondary | ICD-10-CM

## 2020-08-27 DIAGNOSIS — R2689 Other abnormalities of gait and mobility: Secondary | ICD-10-CM | POA: Diagnosis not present

## 2020-08-27 DIAGNOSIS — G8929 Other chronic pain: Secondary | ICD-10-CM

## 2020-08-27 NOTE — Therapy (Signed)
[facility] Physical Therapy [address] [place], [place], [address] Phone: [phone]   Fax:  [phone]  Physical Therapy Treatment  Patient Details  Name: Luis [name] MRN: [id number] Date of Birth: 05-09-1946 Referring Provider (PT): Marlou Sa Tonna Corner, MD   Encounter Date: 08/27/2020   PT End of Session - 08/27/20 1100    Visit Number 4    Number of Visits 12    Date for PT Re-Evaluation 09/17/20    Authorization Type BCBS MCR    PT Start Time 1014    PT Stop Time 1053    PT Time Calculation (min) 39 min    Activity Tolerance Patient tolerated treatment well    Behavior During Therapy [facility] for tasks assessed/performed           Past Medical History:  Diagnosis Date  . AICD (automatic cardioverter/defibrillator) present 12/15/2017  . AKI (acute kidney injury) ([place]) 03/28/2017  . Cardiogenic shock ([place]) 03/28/2017  . Chronic combined systolic and diastolic heart failure ([place]) 05/20/2019  . Chronic kidney disease    kidney stones; noted per H&P per Dr [name] 02/06/2015 chronic kidney disease stage II  . Dyslipidemia   . Encounter for assessment of implantable cardioverter-defibrillator (ICD) 05/20/2019  . History of hepatomegaly    with fatty liver discussed per H&P per Dr [name] 02/06/2015 secondary to abd ultrasound   . History of kidney stones   . LVAD (left ventricular assist device) present ([place])   . Pneumothorax on right   . Sleep apnea    AHI 17 on sleep study 08/23/2013 pt states was given CPAP machine but sent it back - does not use now 12/15/2017  . 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  . Vitamin D deficiency    as noted per H&P per Dr [name] 02/06/2015    Past Surgical History:  Procedure Laterality Date  . ASD REPAIR N/A 03/25/2017   Procedure: ATRIAL SEPTAL DEFECT (ASD) REPAIR;  Surgeon: [name], MD;  Location: [place];  Service: Open Heart Surgery;  Laterality: N/A;  . BIOPSY  03/10/2019   Procedure: BIOPSY;   Surgeon: Juanita Craver, MD;  [facility];  Service: Endoscopy;;  . CARDIAC DEFIBRILLATOR PLACEMENT  12/15/2017  . COLONOSCOPY WITH PROPOFOL N/A 03/10/2019   Procedure: COLONOSCOPY WITH PROPOFOL;  Surgeon: Juanita Craver, MD;  [facility];  Service: Endoscopy;  Laterality: N/A;  . CORONARY ARTERY BYPASS GRAFT N/A 03/25/2017   Procedure: CORONARY ARTERY BYPASS GRAFTING (CABG) x 2 using left internal mammary artery and right greater saphenous leg vein using endosciope.;  Surgeon: [name], MD;  Location: [place];  Service: Open Heart Surgery;  Laterality: N/A;  . CYSTOSCOPY W/ RETROGRADES Left 03/26/2015   Procedure: CYSTOSCOPY WITH RETROGRADE PYELOGRAM;  Surgeon: [name], MD;  [facility];  Service: Urology;  Laterality: Left;  . ICD IMPLANT N/A 12/15/2017   Procedure: ICD IMPLANT;  Surgeon: [name], MD;  Location: [place] [facility];  Service: Cardiovascular;  Laterality: N/A;  . LEFT HEART CATH AND CORONARY ANGIOGRAPHY N/A 03/25/2017   Procedure: Left Heart Cath and Coronary Angiography;  Surgeon: [name], MD;  Location: [place] [facility];  Service: Cardiovascular;  Laterality: N/A;  . PLACEMENT OF IMPELLA LEFT VENTRICULAR ASSIST DEVICE  03/25/2017   Procedure: PLACEMENT OF IMPELLA LEFT VENTRICULAR ASSIST DEVICE;  Surgeon: [name], MD;  Location: [place];  Service: Open Heart Surgery;;  . REMOVAL OF IMPELLA LEFT VENTRICULAR ASSIST DEVICE N/A 03/31/2017   Procedure:  REMOVAL OF IMPELLA LEFT VENTRICULAR ASSIST DEVICE;  Surgeon: [name], MD;  Location: [place];  Service: Open Heart Surgery;  Laterality: N/A;  . ROBOT ASSISTED PYELOPLASTY Left 03/26/2015   Procedure: ROBOTIC ASSISTED PYELOPLASTY WITH STENT PLACEMENT, REMOVAL OF STONE AND CYST DECORTICATION;  Surgeon: [name], MD;  [facility];  Service: Urology;  Laterality: Left;  . TEE WITHOUT CARDIOVERSION N/A 03/25/2017   Procedure: TRANSESOPHAGEAL ECHOCARDIOGRAM (TEE);   Surgeon: [name], [name], MD;  Location: [place];  Service: Open Heart Surgery;  Laterality: N/A;  . TEE WITHOUT CARDIOVERSION N/A 03/31/2017   Procedure: TRANSESOPHAGEAL ECHOCARDIOGRAM (TEE);  Surgeon: [name], [name], MD;  Location: [place];  Service: Open Heart Surgery;  Laterality: N/A;    There were no vitals filed for this visit.   Subjective Assessment - 08/27/20 1015    Subjective reports back is better - went to [place] last week and "bed was too soft."    Limitations [place] hold activities    Diagnostic tests lumbar XR "mild facet degen changes, bilat knee XR negative    Patient Stated Goals reduce pain    Currently in Pain? Yes    Pain Score 4     Pain Location Back    Pain Orientation Right;Left    Pain Descriptors / Indicators Aching    Pain Type Chronic pain    Pain Onset More than a month ago    Pain Frequency Constant    Aggravating Factors  bending, getting down on the floor    Pain Relieving Factors lying down, heat                             [facility] Adult PT Treatment/Exercise - 08/27/20 1015      Lumbar Exercises: Stretches   Single Knee to Chest Stretch Right;Left;20 seconds;3 reps    Double Knee to Chest Stretch --   with rocking 10 x 5 sec bil     Lumbar Exercises: Aerobic   Nustep L6 x 8 min      Lumbar Exercises: Standing   Row Both;20 reps;Theraband    Theraband Level (Row) Level 4 (Blue)      Lumbar Exercises: Seated   Sit to Stand 20 reps    Other Seated Lumbar Exercises seated SLR 2x10 bil with 3 sec hold      Lumbar Exercises: Supine   Bridge 10 reps;5 seconds    Bridge Limitations 2 sets      Manual Therapy   Manual therapy comments PA lumbar mobs L2-L4, soft tissue mobs to lumbar paraspinals            Trigger Point Dry Needling - 08/27/20 1030    Consent Given? Yes    Education Handout Provided Yes    Muscles Treated Back/Hip Lumbar multifidi    Lumbar multifidi Response Twitch response elicited                 PT Education - 08/27/20 1036    Education Details DN    Person(s) Educated Patient    Methods Explanation;Handout    Comprehension Verbalized understanding            PT Short Term Goals - 08/08/20 0846      PT SHORT TERM GOAL #1   Title independent with initial HEP    Status On-going             PT Long Term Goals - 08/20/20  [place] #1   Title Pt will be I and compliant with HEP progression.    Period Weeks    Status On-going      PT LONG TERM GOAL #2   Title Decrease overall pain to less than 4/10 with usual activity    Status On-going      PT LONG TERM GOAL #3   Title Pt will improve leg strength to overall 4+/5    Status On-going      PT LONG TERM GOAL #4   Title Pt will improve lumbar ROM to to [facility].    Status On-going                 Plan - 08/27/20 1101    Clinical Impression Statement Pt tolerated session well today with positive response to DN and manual therapy today.  Decreased pain and improved mobility with CPA mobs of lumbar spine following DN today.  All goals ongoing at this time.    Personal Factors and Comorbidities Comorbidity 3+;Time since onset of injury/illness/exacerbation;Past/Current Experience    Comorbidities PMH: heart failure,debibrillator, S/P CABG  blood thinners,    Examination-Participation Restrictions Cleaning;Driving;Community Activity;Laundry;Yard Work;Shop    Stability/Clinical Decision Making Evolving/Moderate complexity    Rehab Potential Good    PT Frequency 2x / week    PT Treatment/Interventions ADLs/Self Care Home Management;Cryotherapy;Moist Heat;Traction;Stair training;Therapeutic activities;Therapeutic exercise;Balance training;Neuromuscular re-education;Manual techniques;Passive range of motion;Dry needling;Joint Manipulations;Spinal Manipulations;Taping    PT Next Visit Plan no electric modalaties due to defibrilator. needs core and quad strength. lumbar stretching. assess  response to DN    PT Home Exercise Plan Access Code: [email or web address]    Consulted and Agree with Plan of Care Patient           Patient will benefit from skilled therapeutic intervention in order to improve the following deficits and impairments:  Decreased activity tolerance, Decreased balance, Decreased range of motion, Decreased strength, Difficulty walking, Hypomobility, Postural dysfunction, Impaired flexibility, Improper body mechanics, Increased muscle spasms, Increased fascial restricitons, Pain  Visit Diagnosis: Chronic bilateral low back pain without sciatica  Chronic pain of left knee  Muscle weakness (generalized)  Other abnormalities of gait and mobility  Chronic pain of right knee     Problem List Patient Active Problem List   Diagnosis Date Noted  . Chronic combined systolic and diastolic heart failure ([place]) 05/20/2019  . Encounter for assessment of implantable cardioverter-defibrillator (ICD) 05/20/2019  . Ischemic cardiomyopathy 12/15/2017  . ICD: Medtronic  single chamber Visia AF MRI VR [id number] ICD -  12/15/2017  12/15/2017  . Dyspnea 04/16/2017  . Acute combined systolic and diastolic heart failure ([place]) 04/16/2017  . 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 Acute respiratory failure with hypoxia ([place]) 03/28/2017  . Coronary artery disease 03/25/2017      Laureen Abrahams, PT, DPT 08/27/20 11:03 AM    [facility] Physical Therapy [address] [place], [place], [address] Phone: [phone]   Fax:  [phone]  Name: Luis [name] MRN: [id number] Date of Birth: July 02, 1946

## 2020-08-27 NOTE — Patient Instructions (Signed)
Access Code: [email or web address] URL: [email or web address] Date: 08/27/2020 Prepared by: Faustino Congress  Exercises Supine Lower Trunk Rotation - 2 x daily - 6 x weekly - 1 sets - 10 reps - 5 sec hold Supine Single Knee to Chest Stretch - 2 x daily - 6 x weekly - 1 sets - 2 reps - 30 hold Supine Quadriceps Stretch with Strap on Table - 2 x daily - 6 x weekly - 2-3 reps - 30 hold Supine Bridge - 2 x daily - 6 x weekly - 1-2 sets - 10 reps - 5 hold Mini Squat with Counter Support - 2 x daily - 6 x weekly - 2-3 sets - 10 reps  Patient Education Trigger Point Dry Needling

## 2020-08-29 ENCOUNTER — Ambulatory Visit: Payer: Medicare Other | Admitting: Physical Therapy

## 2020-08-29 ENCOUNTER — Other Ambulatory Visit: Payer: Self-pay

## 2020-08-29 DIAGNOSIS — M6281 Muscle weakness (generalized): Secondary | ICD-10-CM | POA: Diagnosis not present

## 2020-08-29 DIAGNOSIS — M545 Low back pain, unspecified: Secondary | ICD-10-CM

## 2020-08-29 DIAGNOSIS — R2689 Other abnormalities of gait and mobility: Secondary | ICD-10-CM | POA: Diagnosis not present

## 2020-08-29 DIAGNOSIS — M25562 Pain in left knee: Secondary | ICD-10-CM | POA: Diagnosis not present

## 2020-08-29 DIAGNOSIS — G8929 Other chronic pain: Secondary | ICD-10-CM

## 2020-08-29 DIAGNOSIS — M25561 Pain in right knee: Secondary | ICD-10-CM

## 2020-08-29 NOTE — Therapy (Signed)
[facility] Physical Therapy [address] [place], [place], [address] Phone: [phone]   Fax:  [phone]  Physical Therapy Treatment  Patient Details  Name: Luis [name] MRN: [id number] Date of Birth: 1946/07/05 Referring Provider (PT): Meredith Pel, MD   Encounter Date: 08/29/2020   PT End of Session - 08/29/20 1103    Visit Number 5    Number of Visits 12    Date for PT Re-Evaluation 09/17/20    Authorization Type BCBS MCR    PT Start Time 1045    PT Stop Time 1130    PT Time Calculation (min) 45 min    Activity Tolerance Patient tolerated treatment well    Behavior During Therapy [facility] for tasks assessed/performed           Past Medical History:  Diagnosis Date   AICD (automatic cardioverter/defibrillator) present 12/15/2017   AKI (acute kidney injury) ([place]) 03/28/2017   Cardiogenic shock ([place]) 03/28/2017   Chronic combined systolic and diastolic heart failure ([place]) 05/20/2019   Chronic kidney disease    kidney stones; noted per H&P per Dr [name] 02/06/2015 chronic kidney disease stage II   Dyslipidemia    Encounter for assessment of implantable cardioverter-defibrillator (ICD) 05/20/2019   History of hepatomegaly    with fatty liver discussed per H&P per Dr [name] 02/06/2015 secondary to abd ultrasound    History of kidney stones    LVAD (left ventricular assist device) present ([place])    Pneumothorax on right    Sleep apnea    AHI 17 on sleep study 08/23/2013 pt states was given CPAP machine but sent it back - does not use now 12/15/2017   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   Vitamin D deficiency    as noted per H&P per Dr [name] 02/06/2015    Past Surgical History:  Procedure Laterality Date   ASD REPAIR N/A 03/25/2017   Procedure: ATRIAL SEPTAL DEFECT (ASD) REPAIR;  Surgeon: [name], MD;  Location: [place];  Service: Open Heart Surgery;  Laterality: N/A;   BIOPSY  03/10/2019   Procedure: BIOPSY;   Surgeon: Juanita Craver, MD;  [facility];  Service: Endoscopy;;   CARDIAC DEFIBRILLATOR PLACEMENT  12/15/2017   COLONOSCOPY WITH PROPOFOL N/A 03/10/2019   Procedure: COLONOSCOPY WITH PROPOFOL;  Surgeon: Juanita Craver, MD;  [facility];  Service: Endoscopy;  Laterality: N/A;   CORONARY ARTERY BYPASS GRAFT N/A 03/25/2017   Procedure: CORONARY ARTERY BYPASS GRAFTING (CABG) x 2 using left internal mammary artery and right greater saphenous leg vein using endosciope.;  Surgeon: [name], MD;  Location: [place];  Service: Open Heart Surgery;  Laterality: N/A;   CYSTOSCOPY W/ RETROGRADES Left 03/26/2015   Procedure: CYSTOSCOPY WITH RETROGRADE PYELOGRAM;  Surgeon: [name], MD;  [facility];  Service: Urology;  Laterality: Left;   ICD IMPLANT N/A 12/15/2017   Procedure: ICD IMPLANT;  Surgeon: [name], MD;  Location: [place] [facility];  Service: Cardiovascular;  Laterality: N/A;   LEFT HEART CATH AND CORONARY ANGIOGRAPHY N/A 03/25/2017   Procedure: Left Heart Cath and Coronary Angiography;  Surgeon: [name], MD;  Location: [place] [facility];  Service: Cardiovascular;  Laterality: N/A;   PLACEMENT OF IMPELLA LEFT VENTRICULAR ASSIST DEVICE  03/25/2017   Procedure: PLACEMENT OF IMPELLA LEFT VENTRICULAR ASSIST DEVICE;  Surgeon: [name], MD;  Location: [place];  Service: Open Heart Surgery;;   REMOVAL OF IMPELLA LEFT VENTRICULAR ASSIST DEVICE N/A 03/31/2017   Procedure:  REMOVAL OF IMPELLA LEFT VENTRICULAR ASSIST DEVICE;  Surgeon: [name], MD;  Location: [place];  Service: Open Heart Surgery;  Laterality: N/A;   ROBOT ASSISTED PYELOPLASTY Left 03/26/2015   Procedure: ROBOTIC ASSISTED PYELOPLASTY WITH STENT PLACEMENT, REMOVAL OF STONE AND CYST DECORTICATION;  Surgeon: [name], MD;  [facility];  Service: Urology;  Laterality: Left;   TEE WITHOUT CARDIOVERSION N/A 03/25/2017   Procedure: TRANSESOPHAGEAL ECHOCARDIOGRAM (TEE);  Surgeon:  [name], [name], MD;  Location: [place];  Service: Open Heart Surgery;  Laterality: N/A;   TEE WITHOUT CARDIOVERSION N/A 03/31/2017   Procedure: TRANSESOPHAGEAL ECHOCARDIOGRAM (TEE);  Surgeon: [name], [name], MD;  Location: [place];  Service: Open Heart Surgery;  Laterality: N/A;    There were no vitals filed for this visit.   Subjective Assessment - 08/29/20 1050    Subjective relays his back is a little sore from then DN, relays overall about 5-6 pain in his back    Limitations House hold activities    Diagnostic tests lumbar XR "mild facet degen changes, bilat knee XR negative    Patient Stated Goals reduce pain    Pain Onset More than a month ago                             [facility] Adult PT Treatment/Exercise - 08/29/20 0001      Lumbar Exercises: Stretches   Double Knee to Chest Stretch 10 seconds    Double Knee to Chest Stretch Limitations 10 reps with feet on pball    Other Lumbar Stretch Exercise seated lumbar pball roll outs 10 sec X 10      Lumbar Exercises: Aerobic   Recumbent Bike L2 X 6 min      Lumbar Exercises: Machines for Strengthening   Leg Press 87 lbs 3X10 bilat      Lumbar Exercises: Standing   Row Both;20 reps;Theraband    Theraband Level (Row) Level 4 (Blue)    Shoulder Extension 20 reps    Theraband Level (Shoulder Extension) Level 4 (Blue)    Other Standing Lumbar Exercises anti rotation press green X 15 reps bilat      Lumbar Exercises: Seated   Other Seated Lumbar Exercises seated SLR X 20 bil with 3 sec hold      Lumbar Exercises: Supine   Ab Set 5 seconds    AB Set Limitations 15 reps using pball to push down with arms and up with knees    Bridge 10 reps;5 seconds    Bridge Limitations 2 sets      Moist Heat Therapy   Number Minutes Moist Heat 7 Minutes    Moist Heat Location Lumbar Spine      Manual Therapy   Manual therapy comments PA lumbar mobs L2-L5, soft tissue mobs to lumbar paraspinals                     PT Short Term Goals - 08/08/20 0846      PT SHORT TERM GOAL #1   Title independent with initial HEP    Status On-going             PT Long Term Goals - 08/20/20 1152      PT LONG TERM GOAL #1   Title Pt will be I and compliant with HEP progression.    Period Weeks    Status On-going      PT LONG TERM  GOAL #2   Title Decrease overall pain to less than 4/10 with usual activity    Status On-going      PT LONG TERM GOAL #3   Title Pt will improve leg strength to overall 4+/5    Status On-going      PT LONG TERM GOAL #4   Title Pt will improve lumbar ROM to to [facility].    Status On-going                 Plan - 08/29/20 1126    Clinical Impression Statement Focused on progression of core/lumbar strength along with lumbar mobility with good tolerance. PT will continue to progress as able toward his functional goals.    Personal Factors and Comorbidities Comorbidity 3+;Time since onset of injury/illness/exacerbation;Past/Current Experience    Comorbidities PMH: heart failure,debibrillator, S/P CABG  blood thinners,    Examination-Participation Restrictions Cleaning;Driving;Community Activity;Laundry;Yard Work;Shop    Stability/Clinical Decision Making Evolving/Moderate complexity    Rehab Potential Good    PT Frequency 2x / week    PT Treatment/Interventions ADLs/Self Care Home Management;Cryotherapy;Moist Heat;Traction;Stair training;Therapeutic activities;Therapeutic exercise;Balance training;Neuromuscular re-education;Manual techniques;Passive range of motion;Dry needling;Joint Manipulations;Spinal Manipulations;Taping    PT Next Visit Plan no electric modalaties due to defibrilator. needs core and quad strength. lumbar stretching. assess response to DN    PT Home Exercise Plan Access Code: [email or web address]    Consulted and Agree with Plan of Care Patient           Patient will benefit from skilled therapeutic intervention in order to improve the  following deficits and impairments:  Decreased activity tolerance, Decreased balance, Decreased range of motion, Decreased strength, Difficulty walking, Hypomobility, Postural dysfunction, Impaired flexibility, Improper body mechanics, Increased muscle spasms, Increased fascial restricitons, Pain  Visit Diagnosis: Chronic bilateral low back pain without sciatica  Chronic pain of left knee  Muscle weakness (generalized)  Other abnormalities of gait and mobility  Chronic pain of right knee     Problem List Patient Active Problem List   Diagnosis Date Noted   Chronic combined systolic and diastolic heart failure ([place]) 05/20/2019   Encounter for assessment of implantable cardioverter-defibrillator (ICD) 05/20/2019   Ischemic cardiomyopathy 12/15/2017   ICD: Medtronic  single chamber Visia AF MRI VR [id number] ICD -  12/15/2017  12/15/2017   Dyspnea 04/16/2017   Acute combined systolic and diastolic heart failure ([place]) 04/16/2017   S/P CABG x 2 03/28/2017   Acute respiratory failure with hypoxia ([place]) 03/28/2017   Coronary artery disease 03/25/2017    Debbe Odea ,PT,DPT 08/29/2020, 11:27 AM  [facility] Physical Therapy [address] [place], [place], [address] Phone: [phone]   Fax:  [phone]  Name: Luis [name] MRN: [id number] Date of Birth: 12/28/1945

## 2020-09-03 ENCOUNTER — Other Ambulatory Visit: Payer: Self-pay

## 2020-09-03 ENCOUNTER — Ambulatory Visit: Payer: Medicare Other | Admitting: Physical Therapy

## 2020-09-03 DIAGNOSIS — R2689 Other abnormalities of gait and mobility: Secondary | ICD-10-CM | POA: Diagnosis not present

## 2020-09-03 DIAGNOSIS — M6281 Muscle weakness (generalized): Secondary | ICD-10-CM | POA: Diagnosis not present

## 2020-09-03 DIAGNOSIS — G8929 Other chronic pain: Secondary | ICD-10-CM

## 2020-09-03 DIAGNOSIS — M25562 Pain in left knee: Secondary | ICD-10-CM | POA: Diagnosis not present

## 2020-09-03 DIAGNOSIS — M545 Low back pain, unspecified: Secondary | ICD-10-CM | POA: Diagnosis not present

## 2020-09-03 DIAGNOSIS — M25561 Pain in right knee: Secondary | ICD-10-CM

## 2020-09-03 NOTE — Therapy (Signed)
[facility] Physical Therapy [address] [place], [place], [address] Phone: [phone]   Fax:  [phone]  Physical Therapy Treatment  Patient Details  Name: Luis [name] MRN: [id number] Date of Birth: [birth year] Referring Provider (PT): Marlou Sa Tonna [place], MD   Encounter Date: 09/03/2020   PT End of Session - 09/03/20 1225    Visit Number 6    Number of Visits 12    Date for PT Re-Evaluation 09/17/20    Authorization Type BCBS MCR    Progress Note Due on Visit 10    PT Start Time 7616    PT Stop Time 1230    PT Time Calculation (min) 45 min    Activity Tolerance Patient tolerated treatment well    Behavior During Therapy [facility] for tasks assessed/performed           Past Medical History:  Diagnosis Date  . AICD (automatic cardioverter/defibrillator) present 12/15/2017  . AKI (acute kidney injury) ([place]) 03/28/2017  . Cardiogenic shock ([place]) 03/28/2017  . Chronic combined systolic and diastolic heart failure ([place]) 05/20/2019  . Chronic kidney disease    kidney stones; noted per H&P per Dr [name] 02/06/2015 chronic kidney disease stage II  . Dyslipidemia   . Encounter for assessment of implantable cardioverter-defibrillator (ICD) 05/20/2019  . History of hepatomegaly    with fatty liver discussed per H&P per Dr [name] 02/06/2015 secondary to abd ultrasound   . History of kidney stones   . LVAD (left ventricular assist device) present ([place])   . Pneumothorax on right   . Sleep apnea    AHI 17 on sleep study 08/23/2013 pt states was given CPAP machine but sent it back - does not use now 12/15/2017  . 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  . Vitamin D deficiency    as noted per H&P per Dr [name] 02/06/2015    Past Surgical History:  Procedure Laterality Date  . ASD REPAIR N/A 03/25/2017   Procedure: ATRIAL SEPTAL DEFECT (ASD) REPAIR;  Surgeon: [name], MD;  Location: [place];  Service: Open Heart Surgery;  Laterality: N/A;  . BIOPSY   03/10/2019   Procedure: BIOPSY;  Surgeon: Juanita Craver, MD;  [facility];  Service: Endoscopy;;  . CARDIAC DEFIBRILLATOR PLACEMENT  12/15/2017  . COLONOSCOPY WITH PROPOFOL N/A 03/10/2019   Procedure: COLONOSCOPY WITH PROPOFOL;  Surgeon: Juanita Craver, MD;  [facility];  Service: Endoscopy;  Laterality: N/A;  . CORONARY ARTERY BYPASS GRAFT N/A 03/25/2017   Procedure: CORONARY ARTERY BYPASS GRAFTING (CABG) x 2 using left internal mammary artery and right greater saphenous leg vein using endosciope.;  Surgeon: [name], MD;  Location: [place];  Service: Open Heart Surgery;  Laterality: N/A;  . CYSTOSCOPY W/ RETROGRADES Left 03/26/2015   Procedure: CYSTOSCOPY WITH RETROGRADE PYELOGRAM;  Surgeon: [name], MD;  [facility];  Service: Urology;  Laterality: Left;  . ICD IMPLANT N/A 12/15/2017   Procedure: ICD IMPLANT;  Surgeon: [name], MD;  Location: [place] [facility];  Service: Cardiovascular;  Laterality: N/A;  . LEFT HEART CATH AND CORONARY ANGIOGRAPHY N/A 03/25/2017   Procedure: Left Heart Cath and Coronary Angiography;  Surgeon: [name], MD;  Location: [place] [facility];  Service: Cardiovascular;  Laterality: N/A;  . PLACEMENT OF IMPELLA LEFT VENTRICULAR ASSIST DEVICE  03/25/2017   Procedure: PLACEMENT OF IMPELLA LEFT VENTRICULAR ASSIST DEVICE;  Surgeon: [name], MD;  Location: [place];  Service: Open Heart Surgery;;  . REMOVAL OF IMPELLA  LEFT VENTRICULAR ASSIST DEVICE N/A 03/31/2017   Procedure: REMOVAL OF IMPELLA LEFT VENTRICULAR ASSIST DEVICE;  Surgeon: [name], MD;  Location: [place];  Service: Open Heart Surgery;  Laterality: N/A;  . ROBOT ASSISTED PYELOPLASTY Left 03/26/2015   Procedure: ROBOTIC ASSISTED PYELOPLASTY WITH STENT PLACEMENT, REMOVAL OF STONE AND CYST DECORTICATION;  Surgeon: [name], MD;  [facility];  Service: Urology;  Laterality: Left;  . TEE WITHOUT CARDIOVERSION N/A 03/25/2017   Procedure:  TRANSESOPHAGEAL ECHOCARDIOGRAM (TEE);  Surgeon: [name], [name], MD;  Location: [place];  Service: Open Heart Surgery;  Laterality: N/A;  . TEE WITHOUT CARDIOVERSION N/A 03/31/2017   Procedure: TRANSESOPHAGEAL ECHOCARDIOGRAM (TEE);  Surgeon: [name], [name], MD;  Location: [place];  Service: Open Heart Surgery;  Laterality: N/A;    There were no vitals filed for this visit.   Subjective Assessment - 09/03/20 1202    Subjective relays the back pain is a little better, the pain is now localized to just the center of his low back    Limitations House hold activities    Diagnostic tests lumbar XR "mild facet degen changes, bilat knee XR negative    Patient Stated Goals reduce pain    Pain Onset More than a month ago             [facility] Adult PT Treatment/Exercise - 09/03/20 0001      Lumbar Exercises: Stretches   Double Knee to Chest Stretch 10 seconds    Double Knee to Chest Stretch Limitations 10 reps with feet on pball    Lower Trunk Rotation 5 reps;10 seconds    Other Lumbar Stretch Exercise seated lumbar pball roll outs 10 sec X 10    Other Lumbar Stretch Exercise standing lumbar ext  X10 reps      Lumbar Exercises: Aerobic   Recumbent Bike L2 X 7 min      Lumbar Exercises: Machines for Strengthening   Leg Press 93 lbs 3X10 bilat      Lumbar Exercises: Standing   Row Both;20 reps;Theraband    Theraband Level (Row) Level 4 (Blue)    Shoulder Extension 20 reps    Theraband Level (Shoulder Extension) Level 4 (Blue)    Other Standing Lumbar Exercises anti rotation press green X 20 reps bilat      Lumbar Exercises: Seated   Other Seated Lumbar Exercises seated SLR 3X10  bil with 3 sec hold, 2#      Lumbar Exercises: Supine   Bridge 10 reps;5 seconds    Bridge Limitations 2 sets      Lumbar Exercises: Prone   Straight Leg Raise 15 reps      Moist Heat Therapy   Number Minutes Moist Heat 7 Minutes    Moist Heat Location Lumbar Spine      Manual Therapy   Manual therapy  comments PA lumbar mobs L2-L5, soft tissue mobs to lumbar paraspinals                    PT Short Term Goals - 08/08/20 0846      PT SHORT TERM GOAL #1   Title independent with initial HEP    Status On-going             PT Long Term Goals - 08/20/20 1152      PT LONG TERM GOAL #1   Title Pt will be I and compliant with HEP progression.    Period Weeks    Status On-going  PT LONG TERM GOAL #2   Title Decrease overall pain to less than 4/10 with usual activity    Status On-going      PT LONG TERM GOAL #3   Title Pt will improve leg strength to overall 4+/5    Status On-going      PT LONG TERM GOAL #4   Title Pt will improve lumbar ROM to to [facility].    Status On-going                 Plan - 09/03/20 1225    Clinical Impression Statement Continued to progress strengthening with good tolerance and without complaints. He appears to be progressing well and pain is more localized now. Continue POC    PT Next Visit Plan no electric modalaties due to defibrilator. needs core and quad strength. lumbar stretching. assess response to DN    PT Home Exercise Plan Access Code: [email or web address]    Consulted and Agree with Plan of Care Patient           Patient will benefit from skilled therapeutic intervention in order to improve the following deficits and impairments:     Visit Diagnosis: Chronic bilateral low back pain without sciatica  Chronic pain of left knee  Muscle weakness (generalized)  Other abnormalities of gait and mobility  Chronic pain of right knee     Problem List Patient Active Problem List   Diagnosis Date Noted  . Chronic combined systolic and diastolic heart failure ([place]) 05/20/2019  . Encounter for assessment of implantable cardioverter-defibrillator (ICD) 05/20/2019  . Ischemic cardiomyopathy 12/15/2017  . ICD: Medtronic  single chamber Visia AF MRI VR [id number] ICD -  12/15/2017  12/15/2017  . Dyspnea 04/16/2017  . Acute combined  systolic and diastolic heart failure ([place]) 04/16/2017  . 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 Acute respiratory failure with hypoxia ([place]) 03/28/2017  . Coronary artery disease 03/25/2017    Silvestre Mesi 09/03/2020, 12:27 PM  [facility] Physical Therapy [address] [place], [place], [address] Phone: [phone]   Fax:  [phone]  Name: Luis [name] MRN: [id number] Date of Birth: [birth year]

## 2020-09-10 ENCOUNTER — Encounter: Payer: Self-pay | Admitting: Cardiology

## 2020-09-10 ENCOUNTER — Ambulatory Visit: Payer: Medicare Other | Admitting: Cardiology

## 2020-09-10 ENCOUNTER — Ambulatory Visit: Payer: Medicare Other | Admitting: Physical Therapy

## 2020-09-10 ENCOUNTER — Other Ambulatory Visit: Payer: Self-pay

## 2020-09-10 ENCOUNTER — Encounter: Payer: Self-pay | Admitting: Physical Therapy

## 2020-09-10 VITALS — BP 90/61 | HR 85 | Resp 16 | Ht 68.0 in | Wt 185.0 lb

## 2020-09-10 DIAGNOSIS — M6281 Muscle weakness (generalized): Secondary | ICD-10-CM

## 2020-09-10 DIAGNOSIS — G8929 Other chronic pain: Secondary | ICD-10-CM

## 2020-09-10 DIAGNOSIS — Z95810 Presence of automatic (implantable) cardiac defibrillator: Secondary | ICD-10-CM

## 2020-09-10 DIAGNOSIS — R0789 Other chest pain: Secondary | ICD-10-CM

## 2020-09-10 DIAGNOSIS — M25562 Pain in left knee: Secondary | ICD-10-CM

## 2020-09-10 DIAGNOSIS — M545 Low back pain, unspecified: Secondary | ICD-10-CM | POA: Diagnosis not present

## 2020-09-10 DIAGNOSIS — R2689 Other abnormalities of gait and mobility: Secondary | ICD-10-CM

## 2020-09-10 DIAGNOSIS — M25561 Pain in right knee: Secondary | ICD-10-CM

## 2020-09-10 NOTE — Progress Notes (Signed)
Primary Physician/Referring:  [name], MD  Patient ID: Luis [name], male    DOB: 09-27-1946, 74 y.o.   MRN: [id number]  Chief Complaint  Patient presents with  . Chest Pain    HPI: Luis [name]  is a 74 y.o. male  with coronary artery disease with history of emergent CABG with LIMA to LAD and SVG to OM by Tharon Aquas Tright. His past medical history includes hyperlipidemia. Underwent single chamber Medtronic ICD implantation on 12/15/2017 for primary prevention in view of persistent low LVEF.   Patient presents for urgent visit with complaints of chest pain starting about 1.5 hours ago today. Patient reports he had PT earlier today around 12:30 pm where he did a new weight lifting exercise that worked his back and chest muscles. He then had new onset of sharp non-exertional left-sided chest pain around 3:00pm. He took 1 sublingual nitroglycerin, which only mildly improved the pain. Pain is not worse with exertion. He has no associated symptoms and the pain does not radiate. Denies palpitations, dyspnea, dizziness, leg swelling, nausea, vomiting. Patient reports the pain is worse with palpation of the left side of his chest.   Past Medical History:  Diagnosis Date  . AICD (automatic cardioverter/defibrillator) present 12/15/2017  . AKI (acute kidney injury) ([place]) 03/28/2017  . Cardiogenic shock ([place]) 03/28/2017  . Chronic combined systolic and diastolic heart failure ([place]) 05/20/2019  . Chronic kidney disease    kidney stones; noted per H&P per Dr [name] 02/06/2015 chronic kidney disease stage II  . Dyslipidemia   . Encounter for assessment of implantable cardioverter-defibrillator (ICD) 05/20/2019  . History of hepatomegaly    with fatty liver discussed per H&P per Dr [name] 02/06/2015 secondary to abd ultrasound   . History of kidney stones   . LVAD (left ventricular assist device) present ([place])   . Pneumothorax on right   . Sleep apnea    AHI 17 on sleep study 08/23/2013 pt states  was given CPAP machine but sent it back - does not use now 12/15/2017  . 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  . Vitamin D deficiency    as noted per H&P per Dr [name] 02/06/2015    Past Surgical History:  Procedure Laterality Date  . ASD REPAIR N/A 03/25/2017   Procedure: ATRIAL SEPTAL DEFECT (ASD) REPAIR;  Surgeon: [name], MD;  Location: [place];  Service: Open Heart Surgery;  Laterality: N/A;  . BIOPSY  03/10/2019   Procedure: BIOPSY;  Surgeon: Juanita Craver, MD;  [facility];  Service: Endoscopy;;  . CARDIAC DEFIBRILLATOR PLACEMENT  12/15/2017  . COLONOSCOPY WITH PROPOFOL N/A 03/10/2019   Procedure: COLONOSCOPY WITH PROPOFOL;  Surgeon: Juanita Craver, MD;  [facility];  Service: Endoscopy;  Laterality: N/A;  . CORONARY ARTERY BYPASS GRAFT N/A 03/25/2017   Procedure: CORONARY ARTERY BYPASS GRAFTING (CABG) x 2 using left internal mammary artery and right greater saphenous leg vein using endosciope.;  Surgeon: [name], MD;  Location: [place];  Service: Open Heart Surgery;  Laterality: N/A;  . CYSTOSCOPY W/ RETROGRADES Left 03/26/2015   Procedure: CYSTOSCOPY WITH RETROGRADE PYELOGRAM;  Surgeon: [name], MD;  [facility];  Service: Urology;  Laterality: Left;  . ICD IMPLANT N/A 12/15/2017   Procedure: ICD IMPLANT;  Surgeon: [name], MD;  Location: [place] [facility];  Service: Cardiovascular;  Laterality: N/A;  . LEFT HEART CATH AND CORONARY ANGIOGRAPHY N/A 03/25/2017   Procedure: Left Heart Cath and Coronary Angiography;  Surgeon:  [name], MD;  Location: [place] [facility];  Service: Cardiovascular;  Laterality: N/A;  . PLACEMENT OF IMPELLA LEFT VENTRICULAR ASSIST DEVICE  03/25/2017   Procedure: PLACEMENT OF IMPELLA LEFT VENTRICULAR ASSIST DEVICE;  Surgeon: [name], MD;  Location: [place];  Service: Open Heart Surgery;;  . REMOVAL OF IMPELLA LEFT VENTRICULAR ASSIST DEVICE N/A 03/31/2017   Procedure: REMOVAL OF IMPELLA  LEFT VENTRICULAR ASSIST DEVICE;  Surgeon: [name], MD;  Location: [place];  Service: Open Heart Surgery;  Laterality: N/A;  . ROBOT ASSISTED PYELOPLASTY Left 03/26/2015   Procedure: ROBOTIC ASSISTED PYELOPLASTY WITH STENT PLACEMENT, REMOVAL OF STONE AND CYST DECORTICATION;  Surgeon: [name], MD;  [facility];  Service: Urology;  Laterality: Left;  . TEE WITHOUT CARDIOVERSION N/A 03/25/2017   Procedure: TRANSESOPHAGEAL ECHOCARDIOGRAM (TEE);  Surgeon: [name], [name], MD;  Location: [place];  Service: Open Heart Surgery;  Laterality: N/A;  . TEE WITHOUT CARDIOVERSION N/A 03/31/2017   Procedure: TRANSESOPHAGEAL ECHOCARDIOGRAM (TEE);  Surgeon: [name], [name], MD;  Location: [place];  Service: Open Heart Surgery;  Laterality: N/A;   Social History   Tobacco Use  . Smoking status: Never Smoker  . Smokeless tobacco: Never Used  Substance Use Topics  . Alcohol use: No   Marital Status: Married   Review of Systems  Constitutional: Negative for malaise/fatigue and weight gain.  Cardiovascular: Positive for chest pain. Negative for claudication, dyspnea on exertion, leg swelling, near-syncope, orthopnea, palpitations, paroxysmal nocturnal dyspnea and syncope.  Respiratory: Negative for shortness of breath.   Hematologic/Lymphatic: Does not bruise/bleed easily.  Gastrointestinal: Positive for flatus. Negative for melena.  Neurological: Negative for dizziness and weakness.   Objective  Blood pressure 90/61, pulse 85, resp. rate 16, height 5' 8"  (1.727 m), weight 185 lb (83.9 kg), SpO2 98 %. Body mass index is 28.13 kg/m.    Physical Exam Vitals reviewed.  Constitutional:      General: He is not in acute distress.    Appearance: He is well-developed.  HENT:     Head: Normocephalic and atraumatic.  Cardiovascular:     Rate and Rhythm: Normal rate and regular rhythm.     Pulses: Intact distal pulses.          Carotid pulses are 2+ on the right side and 2+ on the left side.       Radial pulses are 2+ on the right side and 2+ on the left side.       Femoral pulses are 2+ on the right side and 2+ on the left side.      Popliteal pulses are 1+ on the right side and 1+ on the left side.       Dorsalis pedis pulses are 2+ on the right side and 2+ on the left side.       Posterior tibial pulses are 2+ on the right side and 2+ on the left side.     Heart sounds: Normal heart sounds, S1 normal and S2 normal. No murmur heard. No gallop.      Comments: No JVD. No pedal edema. Pulmonary:     Effort: Pulmonary effort is normal. No respiratory distress.     Breath sounds: Normal breath sounds. No wheezing, rhonchi or rales.  Chest:     Chest wall: Tenderness present.    Abdominal:     General: Bowel sounds are normal.     Palpations: Abdomen is soft.  Musculoskeletal:     Right lower leg:  No edema.     Left lower leg: No edema.  Neurological:     Mental Status: He is alert.    Laboratory examination:   CMP Latest Ref Rng & Units 04/25/2019 12/10/2017 04/17/2017  Glucose 70 - 99 mg/dL 108(H) 104(H) 93  BUN 8 - 23 mg/dL 20 18 12   Creatinine 0.61 - 1.24 mg/dL 1.18 1.00 1.19  Sodium 135 - 145 mmol/L 132(L) 140 134(L)  Potassium 3.5 - 5.1 mmol/L 5.1 5.1 4.3  Chloride 98 - 111 mmol/L 104 104 101  CO2 22 - 32 mmol/L 17(L) 23 24  Calcium 8.9 - 10.3 mg/dL 8.4(L) 9.4 8.5(L)  Total Protein 6.5 - 8.1 g/dL - - -  Total Bilirubin 0.3 - 1.2 mg/dL - - -  Alkaline Phos 38 - 126 U/L - - -  AST 15 - 41 U/L - - -  ALT 17 - 63 U/L - - -   CBC Latest Ref Rng & Units 04/25/2019 12/10/2017 04/16/2017  WBC 4.0 - 10.5 K/uL 12.0(H) 6.5 7.7  Hemoglobin 13.0 - 17.0 g/dL 14.8 13.8 9.9(L)  Hematocrit 39.0 - 52.0 % 43.9 40.5 30.8(L)  Platelets 150 - 400 K/uL 180 243 414(H)   Lipid Panel     Component Value Date/Time   CHOL 114 02/16/2020 0823   TRIG 135 02/16/2020 0823   HDL 38 (L) 02/16/2020 0823   LDLCALC 52 02/16/2020 0823   HEMOGLOBIN A1C Lab Results  Component Value Date    HGBA1C 6.1 (H) 03/26/2017   MPG 128 03/26/2017   TSH No results for input(s): TSH in the last 8760 hours.  External Labs:   Labs 05/03/2019: Potassium 5.2, BUN 14, creatinine 0.9, eGFR greater than 60 him up, CMP otherwise normal.  A1c 6.1%.  Labs 03/17/2019: Total cholesterol 114, triglycerides 180, HDL 41, LDL 37.  Non-HDL cholesterol 73.  HB 15.1/HCT 44.8, platelets 291.  Potassium 5.4, BUN 12, creatinine 1.1, eGFR 63/73 ML.  A1c 6.1%.  Labs 10/25/2019: Hb 15.2/HCT 42.9, platelets 274. Sodium 139, potassium 5.4, serum glucose 99 mg, BUN 8, creatinine 1.11, EGFR 66 mL. Total cholesterol 118, triglycerides 180, HDL 40, LDL 42. A1c 6.1%.  Medications   Current Outpatient Medications  Medication Instructions  . aspirin EC 81 mg, Oral, Daily  . carvedilol (COREG) 3.125 mg, Oral, 2 times daily  . cholecalciferol (VITAMIN D) 1,000 Units, Oral, Daily  . CoQ10 200 mg, Oral, Daily  . fenofibrate (TRICOR) 48 mg, Oral, Daily with lunch  . nitroGLYCERIN (NITROSTAT) 0.4 mg, Sublingual, Every 5 min PRN  . Repatha SureClick 014 mg, Subcutaneous, Every 14 days  . sacubitril-valsartan (ENTRESTO) 24-26 MG 1 tablet, Oral, 2 times daily  . vitamin B-12 (CYANOCOBALAMIN) 1,000 mcg, Oral, Daily   Radiology:   No results found.  Cardiac Studies:   Coronary angiogram 03/25/2017: Mild decrease in LV systolic function, EF 10% with inferior, inferoapical severe hypokinesis. LAD CTO midsegment just after origin of a large D1, short segment occlusion with type II collaterals from the RCA. Bridging Collaterals also evident. Mid circumflex very large with a 75% stenosis. Large OM1. Mild disease. RCA mild diffuse disease, large vessel. Type II collaterals to the LAD. Distal small PL secondary branch is occluded and has collaterals from left to right.   Interventional data: Unsuccessful attempt at PTCA LAD, left main dissection occurred, patient had to be emergently transferred to the operating room.  Patient's family members including wife and son were met with, Dr. Dahlia Byes evaluated the patient in the Cath  Lab. 150 mL contrast utilized.  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  Echocardiogram 07/20/2019:  Left ventricle cavity is normal in size. Mild concentric hypertrophy of the left ventricle. Dyskinetic anterior/ anteroseptal wall with moderate global hypokinesis. Moderately depressed LV systolic function with EF 30-35%. Doppler evidence of grade I (impaired) diastolic dysfunction, normal LAP.  Device lead seen in RA/RV.  No significant valvular abnormality.  Normal right atrial pressure.  No significant change compared to previous study on 05/25/2018.  Exercise tetrofosmin stress test  08/08/2019: Normal ECG stress. Patinet exercised for a total of 5 minutes and 59 seconds, achieving approximately 7.05 METs. Baseline heart rate was measured at 74 bpm. A maximum heart rate of 132 beats per minute was achieved, which is 89% of maximum predicted heart rate. Normal BP resonse.  Mild degree medium extent mildly abnormal perfusion consistent with mild (reversible) ischemia located in the mid anteroseptal wall and basal anterior wall (Left Anterior Descending Artery region) of left ventricle. Akinesis of the apical anterior wall, apical cap, apical septal wall, mid anteroseptal wall, mid anterior wall, basal anteroseptal wall and basal anterior wall of the left ventricle.  Stress LV EF: 26%.  High risk sutyd due to severe decrease in LV systolic function and minimal ischemia.  No previous exam available for comparison.  ICD:   Scheduled  In office ICD 03/28/20  Single (S)/Dual (D)/BV (M) S Presenting VS Pacer dependant: No. Underlying VS. VP <0.1%.  AMS Episodes 0.   HVR 0.  Longevity 9.9 Years/Voltage. Charge time 3.8Sec. Lead measurements: Stable Histogram: Low (L)/normal (N)/high (H)  Normal. Patient activity Increased. Thoracic impedance:  Below threshold Observations: Normal function.  Changes: None  Scheduled Remote ICD check  06/05/2020:    No VHR episodes, no mode switches. OptiVol Fluid Index do no present significant abnormalities. Battery longevity is 9.6 years. RV pacing is <0.1 %  Scheduled Remote ICD check 08/28/2020:  No VHR episodes, no mode switches. OptiVol Fluid Index do no present significant abnormalities. Battery longevity is 9.3 years. RV pacing is 0.0 %.   EKG:   EKG 09/10/2020: Sinus rhythm at a rate of 87 bpm.  Normal axis. Poor R wave progression, cannot exclude anteroseptal infarct old.  Nonspecific T wave abnormality. Compared to EKG 07/12/2020, LAE not noted.   Assessment     ICD-10-CM   1. Atypical chest pain  R07.89 EKG 12-Lead  2. ICD: Medtronic  single chamber Visia AF MRI VR [id number] ICD -  12/15/2017   Z95.810      No orders of the defined types were placed in this encounter.  There are no discontinued medications.  Recommendations:   Vian Fluegel  is a 74 y.o. male  with coronary artery disease with history of emergent CABG with LIMA to LAD and SVG to OM by Tharon Aquas Tright. His past medical history includes hyperlipidemia. Underwent single chamber Medtronic ICD implantation on 12/15/2017 for primary prevention in view of persistent low LVEF.   Patient presents for urgent visit with concerns of chest pain.  Chest pain symptoms are atypical and highly suggestive of musculoskeletal etiology.  EKG today is unchanged from previous without concern for ACS.  Exam is also reassuring as patient is tender to palpation over the left side of the chest.  Suspect patient sustained musculoskeletal injury during physical therapy session earlier today.  Advised patient to notify our office if symptoms worsen or fail to improve, patient verbalized understanding and agreement.  Discussed and reviewed with  patient recent remote ICD check from 08/28/2020 which revealed normal function and no signs of  heart failure.  Follow up 01/11/2021 as previously scheduled.   Patient was seen in collaboration with Dr. [name]. He also reviewed patient's chart and examined the patient. Dr. [name] is in agreement of the plan.  Alethia Berthold, PA-C 09/10/2020, 4:43 PM Office: [phone]

## 2020-09-10 NOTE — Therapy (Signed)
[facility] [address] [place], [place], [address] Phone: [phone]   Fax:  [phone]  Physical Therapy Treatment  Patient Details  Name: Luis [name] MRN: [id number] Date of Birth: 06-06-1946 Referring Provider (PT): Marlou Sa Tonna Corner, MD   Encounter Date: 09/10/2020   PT End of Session - 09/10/20 1254    Visit Number 7    Number of Visits 12    Date for PT Re-Evaluation 09/17/20    Authorization Type BCBS MCR    Progress Note Due on Visit 10    PT Start Time 1143    PT Stop Time 1225    PT Time Calculation (min) 42 min    Activity Tolerance Patient tolerated treatment well    Behavior During Therapy [facility] for tasks assessed/performed           Past Medical History:  Diagnosis Date  . AICD (automatic cardioverter/defibrillator) present 12/15/2017  . AKI (acute kidney injury) ([place]) 03/28/2017  . Cardiogenic shock ([place]) 03/28/2017  . Chronic combined systolic and diastolic heart failure ([place]) 05/20/2019  . Chronic kidney disease    kidney stones; noted per H&P per Dr [name] 02/06/2015 chronic kidney disease stage II  . Dyslipidemia   . Encounter for assessment of implantable cardioverter-defibrillator (ICD) 05/20/2019  . History of hepatomegaly    with fatty liver discussed per H&P per Dr [name] 02/06/2015 secondary to abd ultrasound   . History of kidney stones   . LVAD (left ventricular assist device) present ([place])   . Pneumothorax on right   . Sleep apnea    AHI 17 on sleep study 08/23/2013 pt states was given CPAP machine but sent it back - does not use now 12/15/2017  . 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  . Vitamin D deficiency    as noted per H&P per Dr [name] 02/06/2015    Past Surgical History:  Procedure Laterality Date  . ASD REPAIR N/A 03/25/2017   Procedure: ATRIAL SEPTAL DEFECT (ASD) REPAIR;  Surgeon: [name], MD;  Location: [place];  Service: Open Heart Surgery;  Laterality: N/A;  .  BIOPSY  03/10/2019   Procedure: BIOPSY;  Surgeon: Juanita Craver, MD;  [facility];  Service: Endoscopy;;  . CARDIAC DEFIBRILLATOR PLACEMENT  12/15/2017  . COLONOSCOPY WITH PROPOFOL N/A 03/10/2019   Procedure: COLONOSCOPY WITH PROPOFOL;  Surgeon: Juanita Craver, MD;  [facility];  Service: Endoscopy;  Laterality: N/A;  . CORONARY ARTERY BYPASS GRAFT N/A 03/25/2017   Procedure: CORONARY ARTERY BYPASS GRAFTING (CABG) x 2 using left internal mammary artery and right greater saphenous leg vein using endosciope.;  Surgeon: [name], MD;  Location: [place];  Service: Open Heart Surgery;  Laterality: N/A;  . CYSTOSCOPY W/ RETROGRADES Left 03/26/2015   Procedure: CYSTOSCOPY WITH RETROGRADE PYELOGRAM;  Surgeon: [name], MD;  [facility];  Service: Urology;  Laterality: Left;  . ICD IMPLANT N/A 12/15/2017   Procedure: ICD IMPLANT;  Surgeon: [name], MD;  Location: [place] [facility];  Service: Cardiovascular;  Laterality: N/A;  . LEFT HEART CATH AND CORONARY ANGIOGRAPHY N/A 03/25/2017   Procedure: Left Heart Cath and Coronary Angiography;  Surgeon: [name], MD;  Location: [place] [facility];  Service: Cardiovascular;  Laterality: N/A;  . PLACEMENT OF IMPELLA LEFT VENTRICULAR ASSIST DEVICE  03/25/2017   Procedure: PLACEMENT OF IMPELLA LEFT VENTRICULAR ASSIST DEVICE;  Surgeon: [name], MD;  Location: [place];  Service: Open Heart Surgery;;  . REMOVAL OF IMPELLA  LEFT VENTRICULAR ASSIST DEVICE N/A 03/31/2017   Procedure: REMOVAL OF IMPELLA LEFT VENTRICULAR ASSIST DEVICE;  Surgeon: [name], MD;  Location: [place];  Service: Open Heart Surgery;  Laterality: N/A;  . ROBOT ASSISTED PYELOPLASTY Left 03/26/2015   Procedure: ROBOTIC ASSISTED PYELOPLASTY WITH STENT PLACEMENT, REMOVAL OF STONE AND CYST DECORTICATION;  Surgeon: [name], MD;  [facility];  Service: Urology;  Laterality: Left;  . TEE WITHOUT CARDIOVERSION N/A 03/25/2017   Procedure:  TRANSESOPHAGEAL ECHOCARDIOGRAM (TEE);  Surgeon: [name], [name], MD;  Location: [place];  Service: Open Heart Surgery;  Laterality: N/A;  . TEE WITHOUT CARDIOVERSION N/A 03/31/2017   Procedure: TRANSESOPHAGEAL ECHOCARDIOGRAM (TEE);  Surgeon: [name], [name], MD;  Location: [place];  Service: Open Heart Surgery;  Laterality: N/A;    There were no vitals filed for this visit.   Subjective Assessment - 09/10/20 1146    Subjective stretches seem to be helping    Limitations House hold activities    Diagnostic tests lumbar XR "mild facet degen changes, bilat knee XR negative    Patient Stated Goals reduce pain    Currently in Pain? Yes    Pain Score 4     Pain Location Back    Pain Orientation Right;Left    Pain Descriptors / Indicators Aching    Pain Type Chronic pain    Pain Onset More than a month ago    Pain Frequency Constant    Aggravating Factors  bending, getting down on the floor    Pain Relieving Factors lying down, heat                             [facility] Adult PT Treatment/Exercise - 09/10/20 1146      Lumbar Exercises: Stretches   Single Knee to Chest Stretch Right;Left;5 reps;20 seconds    Lower Trunk Rotation 5 reps;20 seconds   bil   Press Ups 10 reps;5 seconds   mid range for flexibility   Piriformis Stretch Right;Left;5 reps;20 seconds      Lumbar Exercises: Aerobic   Nustep L7 x 8 min      Lumbar Exercises: Machines for Strengthening   Leg Press 112# 3x10    Other Lumbar Machine Exercise rows 25# 3x10      Lumbar Exercises: Supine   Bridge 10 reps;5 seconds    Bridge Limitations 2 sets      Manual Therapy   Manual therapy comments PA lumbar mobs L2-L5, soft tissue mobs to lumbar paraspinals            Trigger Point Dry Needling - 09/10/20 1203    Consent Given? Yes    Education Handout Provided Previously provided    Muscles Treated Back/Hip Lumbar multifidi    Lumbar multifidi Response Twitch response elicited                   PT Short Term Goals - 09/10/20 1255      PT SHORT TERM GOAL #1   Title independent with initial HEP    Status Achieved             PT Long Term Goals - 08/20/20 1152      PT LONG TERM GOAL #1   Title Pt will be I and compliant with HEP progression.    Period Weeks    Status On-going      PT LONG TERM GOAL #2   Title Decrease overall  pain to less than 4/10 with usual activity    Status On-going      PT LONG TERM GOAL #3   Title Pt will improve leg strength to overall 4+/5    Status On-going      PT LONG TERM GOAL #4   Title Pt will improve lumbar ROM to to [facility].    Status On-going                 Plan - 09/10/20 1256    Clinical Impression Statement Pt tolerated session well today with decreased pain following session.  Positive response to DN and strenngthening/stretching today.  Will continue to benefit from PT to maximize function.    Comorbidities PMH: heart failure,debibrillator, S/P CABG  blood thinners,    PT Treatment/Interventions ADLs/Self Care Home Management;Cryotherapy;Moist Heat;Traction;Stair training;Therapeutic activities;Therapeutic exercise;Balance training;Neuromuscular re-education;Manual techniques;Passive range of motion;Dry needling;Joint Manipulations;Spinal Manipulations;Taping    PT Next Visit Plan no electric modalaties due to defibrilator. needs core and quad strength. lumbar stretching. assess response to DN    PT Home Exercise Plan Access Code: [email or web address]    Consulted and Agree with Plan of Care Patient           Patient will benefit from skilled therapeutic intervention in order to improve the following deficits and impairments:  Decreased activity tolerance,Decreased balance,Decreased range of motion,Decreased strength,Difficulty walking,Hypomobility,Postural dysfunction,Impaired flexibility,Improper body mechanics,Increased muscle spasms,Increased fascial restricitons,Pain  Visit Diagnosis: Chronic bilateral low  back pain without sciatica  Chronic pain of left knee  Muscle weakness (generalized)  Other abnormalities of gait and mobility  Chronic pain of right knee     Problem List Patient Active Problem List   Diagnosis Date Noted  . Chronic combined systolic and diastolic heart failure ([place]) 05/20/2019  . Encounter for assessment of implantable cardioverter-defibrillator (ICD) 05/20/2019  . Ischemic cardiomyopathy 12/15/2017  . ICD: Medtronic  single chamber Visia AF MRI VR [id number] ICD -  12/15/2017  12/15/2017  . Dyspnea 04/16/2017  . Acute combined systolic and diastolic heart failure ([place]) 04/16/2017  . 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 Acute respiratory failure with hypoxia ([place]) 03/28/2017  . Coronary artery disease 03/25/2017      Laureen Abrahams, PT, DPT 09/10/20 12:58 PM    [facility] Physical [facility] [address] [place], [place], [address] Phone: [phone]   Fax:  [phone]  Name: Luis [name] MRN: [id number] Date of Birth: [birth year]

## 2020-09-12 ENCOUNTER — Encounter: Payer: Medicare Other | Admitting: Physical Therapy

## 2020-09-14 ENCOUNTER — Other Ambulatory Visit: Payer: Self-pay

## 2020-09-14 ENCOUNTER — Ambulatory Visit: Payer: Medicare Other | Admitting: Physical Therapy

## 2020-09-14 DIAGNOSIS — M25561 Pain in right knee: Secondary | ICD-10-CM | POA: Diagnosis not present

## 2020-09-14 DIAGNOSIS — M6281 Muscle weakness (generalized): Secondary | ICD-10-CM

## 2020-09-14 DIAGNOSIS — M25562 Pain in left knee: Secondary | ICD-10-CM | POA: Diagnosis not present

## 2020-09-14 DIAGNOSIS — G8929 Other chronic pain: Secondary | ICD-10-CM

## 2020-09-14 DIAGNOSIS — M545 Low back pain, unspecified: Secondary | ICD-10-CM

## 2020-09-14 NOTE — Therapy (Signed)
[facility] [address] [place], [place], [address] Phone: [phone]   Fax:  [phone]  Physical Therapy Treatment  Patient Details  Name: Luis [name] MRN: [id number] Date of Birth: 1946/07/10 Referring Provider (PT): Marlou Sa Tonna Corner, MD   Encounter Date: 09/14/2020   PT End of Session - 09/14/20 1200    Visit Number 8    Number of Visits 12    Date for PT Re-Evaluation 09/17/20    Authorization Type BCBS MCR    Progress Note Due on Visit 10    PT Start Time 3212    PT Stop Time 1225    PT Time Calculation (min) 40 min    Activity Tolerance Patient tolerated treatment well    Behavior During Therapy [facility] for tasks assessed/performed           Past Medical History:  Diagnosis Date  . AICD (automatic cardioverter/defibrillator) present 12/15/2017  . AKI (acute kidney injury) ([place]) 03/28/2017  . Cardiogenic shock ([place]) 03/28/2017  . Chronic combined systolic and diastolic heart failure ([place]) 05/20/2019  . Chronic kidney disease    kidney stones; noted per H&P per Dr [name] 02/06/2015 chronic kidney disease stage II  . Dyslipidemia   . Encounter for assessment of implantable cardioverter-defibrillator (ICD) 05/20/2019  . History of hepatomegaly    with fatty liver discussed per H&P per Dr [name] 02/06/2015 secondary to abd ultrasound   . History of kidney stones   . LVAD (left ventricular assist device) present ([place])   . Pneumothorax on right   . Sleep apnea    AHI 17 on sleep study 08/23/2013 pt states was given CPAP machine but sent it back - does not use now 12/15/2017  . 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  . Vitamin D deficiency    as noted per H&P per Dr [name] 02/06/2015    Past Surgical History:  Procedure Laterality Date  . ASD REPAIR N/A 03/25/2017   Procedure: ATRIAL SEPTAL DEFECT (ASD) REPAIR;  Surgeon: [name], MD;  Location: [place];  Service: Open Heart Surgery;  Laterality: N/A;  .  BIOPSY  03/10/2019   Procedure: BIOPSY;  Surgeon: Juanita Craver, MD;  [facility];  Service: Endoscopy;;  . CARDIAC DEFIBRILLATOR PLACEMENT  12/15/2017  . COLONOSCOPY WITH PROPOFOL N/A 03/10/2019   Procedure: COLONOSCOPY WITH PROPOFOL;  Surgeon: Juanita Craver, MD;  [facility];  Service: Endoscopy;  Laterality: N/A;  . CORONARY ARTERY BYPASS GRAFT N/A 03/25/2017   Procedure: CORONARY ARTERY BYPASS GRAFTING (CABG) x 2 using left internal mammary artery and right greater saphenous leg vein using endosciope.;  Surgeon: [name], MD;  Location: [place];  Service: Open Heart Surgery;  Laterality: N/A;  . CYSTOSCOPY W/ RETROGRADES Left 03/26/2015   Procedure: CYSTOSCOPY WITH RETROGRADE PYELOGRAM;  Surgeon: [name], MD;  [facility];  Service: Urology;  Laterality: Left;  . ICD IMPLANT N/A 12/15/2017   Procedure: ICD IMPLANT;  Surgeon: [name], MD;  Location: [place] [facility];  Service: Cardiovascular;  Laterality: N/A;  . LEFT HEART CATH AND CORONARY ANGIOGRAPHY N/A 03/25/2017   Procedure: Left Heart Cath and Coronary Angiography;  Surgeon: [name], MD;  Location: [place] [facility];  Service: Cardiovascular;  Laterality: N/A;  . PLACEMENT OF IMPELLA LEFT VENTRICULAR ASSIST DEVICE  03/25/2017   Procedure: PLACEMENT OF IMPELLA LEFT VENTRICULAR ASSIST DEVICE;  Surgeon: [name], MD;  Location: [place];  Service: Open Heart Surgery;;  . REMOVAL OF IMPELLA  LEFT VENTRICULAR ASSIST DEVICE N/A 03/31/2017   Procedure: REMOVAL OF IMPELLA LEFT VENTRICULAR ASSIST DEVICE;  Surgeon: [name], MD;  Location: [place];  Service: Open Heart Surgery;  Laterality: N/A;  . ROBOT ASSISTED PYELOPLASTY Left 03/26/2015   Procedure: ROBOTIC ASSISTED PYELOPLASTY WITH STENT PLACEMENT, REMOVAL OF STONE AND CYST DECORTICATION;  Surgeon: [name], MD;  [facility];  Service: Urology;  Laterality: Left;  . TEE WITHOUT CARDIOVERSION N/A 03/25/2017   Procedure:  TRANSESOPHAGEAL ECHOCARDIOGRAM (TEE);  Surgeon: [name], [name], MD;  Location: [place];  Service: Open Heart Surgery;  Laterality: N/A;  . TEE WITHOUT CARDIOVERSION N/A 03/31/2017   Procedure: TRANSESOPHAGEAL ECHOCARDIOGRAM (TEE);  Surgeon: [name], [name], MD;  Location: [place];  Service: Open Heart Surgery;  Laterality: N/A;    There were no vitals filed for this visit.   Subjective Assessment - 09/14/20 1200    Subjective relays the DN helped some, says the pain is about 4-5/10 today    Limitations House hold activities    Diagnostic tests lumbar XR "mild facet degen changes, bilat knee XR negative    Patient Stated Goals reduce pain    Pain Onset More than a month ago                             [facility] Adult PT Treatment/Exercise - 09/14/20 0001      Lumbar Exercises: Stretches   Single Knee to Chest Stretch Right;Left;2 reps;30 seconds    Lower Trunk Rotation 10 seconds;5 reps    Press Ups --    Piriformis Stretch Right;Left;3 reps;30 seconds    Other Lumbar Stretch Exercise seated lumbar pball roll outs 10 sec X 10    Other Lumbar Stretch Exercise standing lumbar ext  X10 reps      Lumbar Exercises: Aerobic   Nustep L7 x 8 min      Lumbar Exercises: Machines for Strengthening   Leg Press 112# 3x10    Other Lumbar Machine Exercise D/C row machine, his cardiologist told him not to do that one due to defibrilator      Lumbar Exercises: Supine   Bridge 10 reps;5 seconds    Bridge Limitations 2 sets      Lumbar Exercises: Sidelying   Hip Abduction Both;15 reps      Lumbar Exercises: Prone   Straight Leg Raise 10 reps      Manual Therapy   Manual therapy comments PA lumbar mobs L2-L5, soft tissue mobs to lumbar paraspinals                    PT Short Term Goals - 09/10/20 1255      PT SHORT TERM GOAL #1   Title independent with initial HEP    Status Achieved             PT Long Term Goals - 08/20/20 1152      PT LONG TERM GOAL #1    Title Pt will be I and compliant with HEP progression.    Period Weeks    Status On-going      PT LONG TERM GOAL #2   Title Decrease overall pain to less than 4/10 with usual activity    Status On-going      PT LONG TERM GOAL #3   Title Pt will improve leg strength to overall 4+/5    Status On-going      PT LONG TERM  GOAL #4   Title Pt will improve lumbar ROM to to [facility].    Status On-going                 Plan - 09/14/20 1306    Clinical Impression Statement Did not have him perform row machine today as he states his cardiologist does not want him to to do this due to his defibrillator. Did continue to work on lumbar stretching and hip/core strength with good tolerance and without complaints. He declined needing any heat today. He will need recert vs DC next visit    Comorbidities PMH: heart failure,debibrillator, S/P CABG  blood thinners,    PT Treatment/Interventions ADLs/Self Care Home Management;Cryotherapy;Moist Heat;Traction;Stair training;Therapeutic activities;Therapeutic exercise;Balance training;Neuromuscular re-education;Manual techniques;Passive range of motion;Dry needling;Joint Manipulations;Spinal Manipulations;Taping    PT Next Visit Plan check goals for recert vs DC. no electric modalaties due to defibrilator. needs core and quad strength. lumbar stretching    PT Home Exercise Plan Access Code: [email or web address]    Consulted and Agree with Plan of Care Patient           Patient will benefit from skilled therapeutic intervention in order to improve the following deficits and impairments:  Decreased activity tolerance,Decreased balance,Decreased range of motion,Decreased strength,Difficulty walking,Hypomobility,Postural dysfunction,Impaired flexibility,Improper body mechanics,Increased muscle spasms,Increased fascial restricitons,Pain  Visit Diagnosis: Chronic bilateral low back pain without sciatica  Chronic pain of left knee  Muscle weakness  (generalized)  Chronic pain of right knee     Problem List Patient Active Problem List   Diagnosis Date Noted  . Chronic combined systolic and diastolic heart failure ([place]) 05/20/2019  . Encounter for assessment of implantable cardioverter-defibrillator (ICD) 05/20/2019  . Ischemic cardiomyopathy 12/15/2017  . ICD: Medtronic  single chamber Visia AF MRI VR [id number] ICD -  12/15/2017  12/15/2017  . Dyspnea 04/16/2017  . Acute combined systolic and diastolic heart failure ([place]) 04/16/2017  . 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 Acute respiratory failure with hypoxia ([place]) 03/28/2017  . Coronary artery disease 03/25/2017    Silvestre Mesi 09/14/2020, 1:14 PM  [facility] Physical Therapy [address] [place], [place], [address] Phone: [phone]   Fax:  [phone]  Name: Luis [name] MRN: [id number] Date of Birth: May 09, 1946

## 2020-09-17 ENCOUNTER — Other Ambulatory Visit: Payer: Self-pay

## 2020-09-17 ENCOUNTER — Ambulatory Visit: Payer: Medicare Other | Admitting: Physical Therapy

## 2020-09-17 ENCOUNTER — Encounter: Payer: Self-pay | Admitting: Physical Therapy

## 2020-09-17 DIAGNOSIS — G8929 Other chronic pain: Secondary | ICD-10-CM

## 2020-09-17 DIAGNOSIS — M6281 Muscle weakness (generalized): Secondary | ICD-10-CM | POA: Diagnosis not present

## 2020-09-17 DIAGNOSIS — M25562 Pain in left knee: Secondary | ICD-10-CM | POA: Diagnosis not present

## 2020-09-17 DIAGNOSIS — M545 Low back pain, unspecified: Secondary | ICD-10-CM | POA: Diagnosis not present

## 2020-09-17 DIAGNOSIS — R2689 Other abnormalities of gait and mobility: Secondary | ICD-10-CM

## 2020-09-17 DIAGNOSIS — M25561 Pain in right knee: Secondary | ICD-10-CM

## 2020-09-17 NOTE — Therapy (Signed)
[facility] [address] [place], [place], [address] Phone: [phone]   Fax:  [phone]  Physical Therapy Treatment/Discharge Summary  Patient Details  Name: Luis [name] MRN: [id number] Date of Birth: 23-Jan-1946 Referring Provider (PT): Marlou Sa Tonna Corner, MD   Encounter Date: 09/17/2020   PT End of Session - 09/17/20 1002    Visit Number 9    Number of Visits 12    Date for PT Re-Evaluation 09/17/20    Authorization Type BCBS MCR    Progress Note Due on Visit 10    PT Start Time 0928    PT Stop Time 1000    PT Time Calculation (min) 32 min    Activity Tolerance Patient tolerated treatment well    Behavior During Therapy [facility] for tasks assessed/performed           Past Medical History:  Diagnosis Date   AICD (automatic cardioverter/defibrillator) present 12/15/2017   AKI (acute kidney injury) ([place]) 03/28/2017   Cardiogenic shock ([place]) 03/28/2017   Chronic combined systolic and diastolic heart failure ([place]) 05/20/2019   Chronic kidney disease    kidney stones; noted per H&P per Dr [name] 02/06/2015 chronic kidney disease stage II   Dyslipidemia    Encounter for assessment of implantable cardioverter-defibrillator (ICD) 05/20/2019   History of hepatomegaly    with fatty liver discussed per H&P per Dr [name] 02/06/2015 secondary to abd ultrasound    History of kidney stones    LVAD (left ventricular assist device) present ([place])    Pneumothorax on right    Sleep apnea    AHI 17 on sleep study 08/23/2013 pt states was given CPAP machine but sent it back - does not use now 12/15/2017   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   Vitamin D deficiency    as noted per H&P per Dr [name] 02/06/2015    Past Surgical History:  Procedure Laterality Date   ASD REPAIR N/A 03/25/2017   Procedure: ATRIAL SEPTAL DEFECT (ASD) REPAIR;  Surgeon: [name], MD;  Location: [place];  Service: Open Heart Surgery;   Laterality: N/A;   BIOPSY  03/10/2019   Procedure: BIOPSY;  Surgeon: Juanita Craver, MD;  [facility];  Service: Endoscopy;;   CARDIAC DEFIBRILLATOR PLACEMENT  12/15/2017   COLONOSCOPY WITH PROPOFOL N/A 03/10/2019   Procedure: COLONOSCOPY WITH PROPOFOL;  Surgeon: Juanita Craver, MD;  [facility];  Service: Endoscopy;  Laterality: N/A;   CORONARY ARTERY BYPASS GRAFT N/A 03/25/2017   Procedure: CORONARY ARTERY BYPASS GRAFTING (CABG) x 2 using left internal mammary artery and right greater saphenous leg vein using endosciope.;  Surgeon: [name], MD;  Location: [place];  Service: Open Heart Surgery;  Laterality: N/A;   CYSTOSCOPY W/ RETROGRADES Left 03/26/2015   Procedure: CYSTOSCOPY WITH RETROGRADE PYELOGRAM;  Surgeon: [name], MD;  [facility];  Service: Urology;  Laterality: Left;   ICD IMPLANT N/A 12/15/2017   Procedure: ICD IMPLANT;  Surgeon: [name], MD;  Location: [place] [facility];  Service: Cardiovascular;  Laterality: N/A;   LEFT HEART CATH AND CORONARY ANGIOGRAPHY N/A 03/25/2017   Procedure: Left Heart Cath and Coronary Angiography;  Surgeon: [name], MD;  Location: [place] [facility];  Service: Cardiovascular;  Laterality: N/A;   PLACEMENT OF IMPELLA LEFT VENTRICULAR ASSIST DEVICE  03/25/2017   Procedure: PLACEMENT OF IMPELLA LEFT VENTRICULAR ASSIST DEVICE;  Surgeon: [name], MD;  Location: [place];  Service: Open Heart Surgery;;   REMOVAL OF  IMPELLA LEFT VENTRICULAR ASSIST DEVICE N/A 03/31/2017   Procedure: REMOVAL OF IMPELLA LEFT VENTRICULAR ASSIST DEVICE;  Surgeon: [name], MD;  Location: [place];  Service: Open Heart Surgery;  Laterality: N/A;   ROBOT ASSISTED PYELOPLASTY Left 03/26/2015   Procedure: ROBOTIC ASSISTED PYELOPLASTY WITH STENT PLACEMENT, REMOVAL OF STONE AND CYST DECORTICATION;  Surgeon: [name], MD;  [facility];  Service: Urology;  Laterality: Left;   TEE WITHOUT CARDIOVERSION N/A 03/25/2017    Procedure: TRANSESOPHAGEAL ECHOCARDIOGRAM (TEE);  Surgeon: [name], [name], MD;  Location: [place];  Service: Open Heart Surgery;  Laterality: N/A;   TEE WITHOUT CARDIOVERSION N/A 03/31/2017   Procedure: TRANSESOPHAGEAL ECHOCARDIOGRAM (TEE);  Surgeon: [name], [name], MD;  Location: [place];  Service: Open Heart Surgery;  Laterality: N/A;    There were no vitals filed for this visit.   Subjective Assessment - 09/17/20 0934    Subjective doing well, no pain today.    Limitations House hold activities    Diagnostic tests lumbar XR "mild facet degen changes, bilat knee XR negative    Patient Stated Goals reduce pain    Currently in Pain? No/denies              [facility] PT Assessment - 09/17/20 0948      Assessment   Medical Diagnosis LBP and bilat knee pain    Referring Provider (PT) Marlou Sa Tonna Corner, MD      AROM   Overall AROM Comments no pain    Lumbar Flexion WNL    Lumbar Extension WNL    Lumbar - Right Side Bend WNL    Lumbar - Left Side Bend WNL    Lumbar - Right Rotation WNL    Lumbar - Left Rotation WNL      Strength   Right Hip Flexion 5/5    Right Hip Extension 5/5    Right Hip ABduction 4+/5    Left Hip Flexion 5/5    Left Hip Extension 5/5    Right Knee Flexion 5/5    Right Knee Extension 5/5    Left Knee Flexion 5/5    Left Knee Extension 5/5                         [facility] Adult PT Treatment/Exercise - 09/17/20 0935      Lumbar Exercises: Stretches   Single Knee to Chest Stretch Right;Left   10 reps x 2 seconds   Lower Trunk Rotation --   10 reps; 5 sec hold   Quad Stretch Right;5 reps;10 seconds      Lumbar Exercises: Aerobic   Nustep L6 x 8 min      Lumbar Exercises: Standing   Functional Squats Limitations 3x10- cues needed for form      Lumbar Exercises: Supine   Bridge 10 reps;5 seconds    Bridge Limitations 2 sets    Straight Leg Raise 10 reps;2 seconds                    PT Short Term Goals - 09/10/20 1255       PT SHORT TERM GOAL #1   Title independent with initial HEP    Status Achieved             PT Long Term Goals - 09/17/20 1002      PT LONG TERM GOAL #1   Title Pt will be I and compliant with HEP progression.  Period Weeks    Status Achieved      PT LONG TERM GOAL #2   Title Decrease overall pain to less than 4/10 with usual activity    Status Achieved      PT LONG TERM GOAL #3   Title Pt will improve leg strength to overall 4+/5    Status Achieved      PT LONG TERM GOAL #4   Title Pt will improve lumbar ROM to to [facility].    Status Achieved                 Plan - 09/17/20 1002    Clinical Impression Statement Pt has met all goals and is ready for d/c from PT.  Will d/c PT today.    Comorbidities PMH: heart failure,debibrillator, S/P CABG  blood thinners,    PT Treatment/Interventions ADLs/Self Care Home Management;Cryotherapy;Moist Heat;Traction;Stair training;Therapeutic activities;Therapeutic exercise;Balance training;Neuromuscular re-education;Manual techniques;Passive range of motion;Dry needling;Joint Manipulations;Spinal Manipulations;Taping    PT Next Visit Plan d/c PT today    PT Home Exercise Plan Access Code: [email or web address]    Consulted and Agree with Plan of Care Patient           Patient will benefit from skilled therapeutic intervention in order to improve the following deficits and impairments:  Decreased activity tolerance,Decreased balance,Decreased range of motion,Decreased strength,Difficulty walking,Hypomobility,Postural dysfunction,Impaired flexibility,Improper body mechanics,Increased muscle spasms,Increased fascial restricitons,Pain  Visit Diagnosis: Chronic bilateral low back pain without sciatica  Chronic pain of left knee  Muscle weakness (generalized)  Chronic pain of right knee  Other abnormalities of gait and mobility     Problem List Patient Active Problem List   Diagnosis Date Noted   Chronic combined systolic and diastolic  heart failure ([place]) 05/20/2019   Encounter for assessment of implantable cardioverter-defibrillator (ICD) 05/20/2019   Ischemic cardiomyopathy 12/15/2017   ICD: Medtronic  single chamber Visia AF MRI VR [id number] ICD -  12/15/2017  12/15/2017   Dyspnea 04/16/2017   Acute combined systolic and diastolic heart failure ([place]) 04/16/2017   S/P CABG x 2 03/28/2017   Acute respiratory failure with hypoxia ([place]) 03/28/2017   Coronary artery disease 03/25/2017     Laureen Abrahams, PT, DPT 09/17/20 10:04 AM    [facility] [address] [place], [place], [address] Phone: [phone]   Fax:  [phone]  Name: Luis [name] MRN: [id number] Date of Birth: [birth year]     PHYSICAL THERAPY DISCHARGE SUMMARY  Visits from Start of Care: 9  Current functional level related to goals / functional outcomes: See above   Remaining deficits: See above   Education / Equipment: HEP  Plan: Patient agrees to discharge.  Patient goals were met. Patient is being discharged due to meeting the stated rehab goals.  ?????    Laureen Abrahams, PT, DPT 09/17/20 10:04 AM  [facility] Physical Therapy [address] [place], [place], [address] Phone: [phone]   Fax:  [phone]

## 2020-09-19 ENCOUNTER — Encounter: Payer: Medicare Other | Admitting: Physical Therapy

## 2020-10-11 ENCOUNTER — Ambulatory Visit: Payer: Medicare Other | Admitting: Cardiology

## 2020-10-11 ENCOUNTER — Encounter: Payer: Self-pay | Admitting: Cardiology

## 2020-10-11 ENCOUNTER — Other Ambulatory Visit: Payer: Self-pay

## 2020-10-11 VITALS — BP 121/76 | HR 71 | Resp 16 | Ht 68.0 in | Wt 184.8 lb

## 2020-10-11 DIAGNOSIS — Z95810 Presence of automatic (implantable) cardiac defibrillator: Secondary | ICD-10-CM

## 2020-10-11 DIAGNOSIS — I5022 Chronic systolic (congestive) heart failure: Secondary | ICD-10-CM

## 2020-10-11 DIAGNOSIS — E782 Mixed hyperlipidemia: Secondary | ICD-10-CM

## 2020-10-11 DIAGNOSIS — I2510 Atherosclerotic heart disease of native coronary artery without angina pectoris: Secondary | ICD-10-CM

## 2020-10-11 NOTE — Progress Notes (Signed)
Primary Physician/Referring:  [name], MD  Patient ID: Luis [name], male    DOB: 10/31/1945, 75 y.o.   MRN: [id number]  Chief Complaint  Patient presents with  . Hypotension  . Follow-up    HPI: Luis [name]  is a 75 y.o. male  with coronary artery disease with history of emergent CABG with LIMA to LAD and SVG to OM by Tharon Aquas Tright. His past medical history includes hyperlipidemia. Underwent single chamber Medtronic ICD implantation on 12/15/2017 for primary prevention in view of persistent low LVEF.   He presents for his routine visit, I had seen him 3 months ago for musculoskeletal chest pain.  He has not had any recurrence of chest pain.  Denies dyspnea, PND or orthopnea or leg edema.  He has noticed especially in the morning he has low blood pressure almost on a daily basis for the past 2 weeks, states that the pressures have been around 80 to 90 mmHg that last for about couple hours and improves spontaneously.  No other associated symptoms.  No syncope or palpitations.  Past Medical History:  Diagnosis Date  . AICD (automatic cardioverter/defibrillator) present 12/15/2017  . AKI (acute kidney injury) ([place]) 03/28/2017  . Cardiogenic shock ([place]) 03/28/2017  . Chronic combined systolic and diastolic heart failure ([place]) 05/20/2019  . Chronic kidney disease    kidney stones; noted per H&P per Dr [name] 02/06/2015 chronic kidney disease stage II  . Dyslipidemia   . Encounter for assessment of implantable cardioverter-defibrillator (ICD) 05/20/2019  . History of hepatomegaly    with fatty liver discussed per H&P per Dr [name] 02/06/2015 secondary to abd ultrasound   . History of kidney stones   . LVAD (left ventricular assist device) present ([place])   . Pneumothorax on right   . Sleep apnea    AHI 17 on sleep study 08/23/2013 pt states was given CPAP machine but sent it back - does not use now 12/15/2017  . 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  . Vitamin D  deficiency    as noted per H&P per Dr [name] 02/06/2015    Past Surgical History:  Procedure Laterality Date  . ASD REPAIR N/A 03/25/2017   Procedure: ATRIAL SEPTAL DEFECT (ASD) REPAIR;  Surgeon: [name], MD;  Location: [place];  Service: Open Heart Surgery;  Laterality: N/A;  . BIOPSY  03/10/2019   Procedure: BIOPSY;  Surgeon: Juanita Craver, MD;  Location: [facility];  Service: Endoscopy;;  . CARDIAC DEFIBRILLATOR PLACEMENT  12/15/2017  . COLONOSCOPY WITH PROPOFOL N/A 03/10/2019   Procedure: COLONOSCOPY WITH PROPOFOL;  Surgeon: Juanita Craver, MD;  Location: [facility];  Service: Endoscopy;  Laterality: N/A;  . CORONARY ARTERY BYPASS GRAFT N/A 03/25/2017   Procedure: CORONARY ARTERY BYPASS GRAFTING (CABG) x 2 using left internal mammary artery and right greater saphenous leg vein using endosciope.;  Surgeon: [name], MD;  Location: [place];  Service: Open Heart Surgery;  Laterality: N/A;  . CYSTOSCOPY W/ RETROGRADES Left 03/26/2015   Procedure: CYSTOSCOPY WITH RETROGRADE PYELOGRAM;  Surgeon: [name], MD;  Location: [facility];  Service: Urology;  Laterality: Left;  . ICD IMPLANT N/A 12/15/2017   Procedure: ICD IMPLANT;  Surgeon: [name], MD;  Location: [facility];  Service: Cardiovascular;  Laterality: N/A;  . LEFT HEART CATH AND CORONARY ANGIOGRAPHY N/A 03/25/2017   Procedure: Left Heart Cath and Coronary Angiography;  Surgeon: [name], MD;  Location: [facility];  Service: Cardiovascular;  Laterality: N/A;  .  PLACEMENT OF IMPELLA LEFT VENTRICULAR ASSIST DEVICE  03/25/2017   Procedure: PLACEMENT OF IMPELLA LEFT VENTRICULAR ASSIST DEVICE;  Surgeon: [name], MD;  Location: [place];  Service: Open Heart Surgery;;  . REMOVAL OF IMPELLA LEFT VENTRICULAR ASSIST DEVICE N/A 03/31/2017   Procedure: REMOVAL OF IMPELLA LEFT VENTRICULAR ASSIST DEVICE;  Surgeon: [name], MD;  Location: [place] [facility];  Service: Open Heart Surgery;  Laterality: N/A;  . ROBOT  ASSISTED PYELOPLASTY Left 03/26/2015   Procedure: ROBOTIC ASSISTED PYELOPLASTY WITH STENT PLACEMENT, REMOVAL OF STONE AND CYST DECORTICATION;  Surgeon: [name], MD;  Location: [facility];  Service: Urology;  Laterality: Left;  . TEE WITHOUT CARDIOVERSION N/A 03/25/2017   Procedure: TRANSESOPHAGEAL ECHOCARDIOGRAM (TEE);  Surgeon: [name], [name], MD;  Location: [place];  Service: Open Heart Surgery;  Laterality: N/A;  . TEE WITHOUT CARDIOVERSION N/A 03/31/2017   Procedure: TRANSESOPHAGEAL ECHOCARDIOGRAM (TEE);  Surgeon: [name], [name], MD;  Location: [place];  Service: Open Heart Surgery;  Laterality: N/A;   Social History   Tobacco Use  . Smoking status: Never Smoker  . Smokeless tobacco: Never Used  Substance Use Topics  . Alcohol use: No   Marital Status: Married   Review of Systems  Constitutional: Negative for malaise/fatigue and weight gain.  Cardiovascular: Positive for chest pain. Negative for claudication, dyspnea on exertion, leg swelling, near-syncope, orthopnea, palpitations, paroxysmal nocturnal dyspnea and syncope.  Respiratory: Negative for shortness of breath.   Hematologic/Lymphatic: Does not bruise/bleed easily.  Gastrointestinal: Positive for flatus. Negative for melena.  Neurological: Negative for dizziness and weakness.   Objective  Blood pressure 121/76, pulse 71, resp. rate 16, height _0  (1.727 m), weight 184 lb 12.8 oz (83.8 kg), SpO2 97 %. Body mass index is 28.1 kg/m.    Physical Exam Constitutional:      General: He is not in acute distress.    Appearance: Normal appearance. He is well-developed.  Cardiovascular:     Rate and Rhythm: Normal rate and regular rhythm.     Pulses: Normal pulses and intact distal pulses.     Heart sounds: Normal heart sounds. No murmur heard. No gallop.      Comments: No JVD. No pedal edema. Pulmonary:     Effort: Pulmonary effort is normal.     Breath sounds: Normal breath sounds.  Abdominal:     General: Bowel sounds  are normal.     Palpations: Abdomen is soft.  Musculoskeletal:        General: Normal range of motion.  Skin:    General: Skin is warm and dry.     Laboratory examination:   CMP Latest Ref Rng & Units 04/25/2019 12/10/2017 04/17/2017  Glucose 70 - 99 mg/dL 108(H) 104(H) 93  BUN 8 - 23 mg/dL _1 Creatinine 0.61 - 1.24 mg/dL 1.18 1.00 1.19  Sodium 135 - 145 mmol/L 132(L) 140 134(L)  Potassium 3.5 - 5.1 mmol/L 5.1 5.1 4.3  Chloride 98 - 111 mmol/L 104 104 101  CO2 22 - 32 mmol/L 17(L) 23 24  Calcium 8.9 - 10.3 mg/dL 8.4(L) 9.4 8.5(L)  Total Protein 6.5 - 8.1 g/dL - - -  Total Bilirubin 0.3 - 1.2 mg/dL - - -  Alkaline Phos 38 - 126 U/L - - -  AST 15 - 41 U/L - - -  ALT 17 - 63 U/L - - -   CBC Latest Ref Rng & Units 04/25/2019 12/10/2017 04/16/2017  WBC 4.0 - 10.5 K/uL 12.0(H)  6.5 7.7  Hemoglobin 13.0 - 17.0 g/dL 14.8 13.8 9.9(L)  Hematocrit 39.0 - 52.0 % 43.9 40.5 30.8(L)  Platelets 150 - 400 K/uL 180 243 414(H)   Lipid Panel     Component Value Date/Time   CHOL 114 02/16/2020 0823   TRIG 135 02/16/2020 0823   HDL 38 (L) 02/16/2020 0823   LDLCALC 52 02/16/2020 0823   HEMOGLOBIN A1C Lab Results  Component Value Date   HGBA1C 6.1 (H) 03/26/2017   MPG 128 03/26/2017   TSH No results for input(s): TSH in the last 8760 hours.  External Labs:   Labs 05/03/2019: Potassium 5.2, BUN 14, creatinine 0.9, eGFR greater than 60 him up, CMP otherwise normal.  A1c 6.1%.  Labs 03/17/2019: Total cholesterol 114, triglycerides 180, HDL 41, LDL 37.  Non-HDL cholesterol 73.  HB 15.1/HCT 44.8, platelets 291.  Potassium 5.4, BUN 12, creatinine 1.1, eGFR 63/73 ML.  A1c 6.1%.  Labs 10/25/2019: Hb 15.2/HCT 42.9, platelets 274. Sodium 139, potassium 5.4, serum glucose 99 mg, BUN 8, creatinine 1.11, EGFR 66 mL. Total cholesterol 118, triglycerides 180, HDL 40, LDL 42. A1c 6.1%.  Medications   Current Outpatient Medications  Medication Instructions  . aspirin EC 81 mg, Oral, Daily  .  carvedilol (COREG) 3.125 mg, Oral, 2 times daily  . cholecalciferol (VITAMIN D) 1,000 Units, Oral, Daily  . CoQ10 200 mg, Oral, Daily  . fenofibrate (TRICOR) 48 mg, Oral, Daily with lunch  . nitroGLYCERIN (NITROSTAT) 0.4 mg, Sublingual, Every 5 min PRN  . Repatha SureClick 161 mg, Subcutaneous, Every 14 days  . sacubitril-valsartan (ENTRESTO) 24-26 MG 1 tablet, Oral, 2 times daily  . vitamin B-12 (CYANOCOBALAMIN) 1,000 mcg, Oral, Daily   Radiology:   No results found.  Cardiac Studies:   Coronary angiogram 03/25/2017: Mild decrease in LV systolic function, EF 09% with inferior, inferoapical severe hypokinesis. LAD CTO midsegment just after origin of a large D1, short segment occlusion with type II collaterals from the RCA. Bridging Collaterals also evident. Mid circumflex very large with a 75% stenosis. Large OM1. Mild disease. RCA mild diffuse disease, large vessel. Type II collaterals to the LAD. Distal small PL secondary branch is occluded and has collaterals from left to right.   Interventional data: Unsuccessful attempt at PTCA LAD, left main dissection occurred, patient had to be emergently transferred to the operating room. Patient's family members including wife and son were met with, Dr. Dahlia Byes evaluated the patient in the Cath Lab. 150 mL contrast utilized.  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  Echocardiogram 07/20/2019:  Left ventricle cavity is normal in size. Mild concentric hypertrophy of the left ventricle. Dyskinetic anterior/ anteroseptal wall with moderate global hypokinesis. Moderately depressed LV systolic function with EF 30-35%. Doppler evidence of grade I (impaired) diastolic dysfunction, normal LAP.  Device lead seen in RA/RV.  No significant valvular abnormality.  Normal right atrial pressure.  No significant change compared to previous study on 05/25/2018.  Exercise tetrofosmin stress test  08/08/2019: Normal ECG  stress. Pat[place] exercised for a total of 5 minutes and 59 seconds, achieving approximately 7.05 METs. Baseline heart rate was measured at 74 bpm. A maximum heart rate of 132 beats per minute was achieved, which is 89% of maximum predicted heart rate. Normal BP resonse.  Mild degree medium extent mildly abnormal perfusion consistent with mild (reversible) ischemia located in the mid anteroseptal wall and basal anterior wall (Left Anterior Descending Artery region) of left ventricle. Akinesis of the apical  anterior wall, apical cap, apical septal wall, mid anteroseptal wall, mid anterior wall, basal anteroseptal wall and basal anterior wall of the left ventricle.  Stress LV EF: 26%.  High risk sutyd due to severe decrease in LV systolic function and minimal ischemia.  No previous exam available for comparison.  ICD:   Scheduled  In office ICD 03/28/20  Single (S)/Dual (D)/BV (M) S Presenting VS Pacer dependant: No. Underlying VS. VP <0.1%.  AMS Episodes 0.   HVR 0.  Longevity 9.9 Years/Voltage. Charge time 3.8Sec. Lead measurements: Stable Histogram: Low (L)/normal (N)/high (H)  Normal. Patient activity Increased. Thoracic impedance: Below threshold Observations: Normal function.  Changes: None  Scheduled Remote ICD check  06/05/2020:    No VHR episodes, no mode switches. OptiVol Fluid Index do no present significant abnormalities. Battery longevity is 9.6 years. RV pacing is <0.1 %  Scheduled Remote ICD check 08/28/2020:  No VHR episodes, no mode switches. OptiVol Fluid Index do no present significant abnormalities. Battery longevity is 9.3 years. RV pacing is 0.0 %.   EKG:   EKG 09/10/2020: Sinus rhythm at a rate of 87 bpm.  Normal axis. Poor R wave progression, cannot exclude anteroseptal infarct old.  Nonspecific T wave abnormality. Compared to EKG 07/12/2020, LAE not noted.   Assessment     ICD-10-CM   1. Coronary artery disease involving native coronary artery of native heart  without angina pectoris  I25.10   2. Chronic systolic heart failure (HCC)  I50.22 PCV ECHOCARDIOGRAM COMPLETE  3. ICD: Medtronic  single chamber Visia AF MRI VR [id number] ICD -  12/15/2017   Z95.810   4. Mixed hyperlipidemia  E78.2     No orders of the defined types were placed in this encounter.  There are no discontinued medications.  Recommendations:   Harvel Meskill  is a 75 y.o. male  with coronary artery disease with history of emergent CABG with LIMA to LAD and SVG to OM by Tharon Aquas Tright. His past medical history includes hyperlipidemia. Underwent single chamber Medtronic ICD implantation on 12/15/2017 for primary prevention in view of persistent low LVEF.   His main complaint is dizziness especially in the morning.  Advised him to try taking carvedilol and Entresto separately especially the evening dose, carvedilol after supper and Entresto prior to going to bed to see if that would make a difference in the early morning low blood pressure that he is having around 80 to 90 mmHg systolic.  Episodes are only short lasting, improves immediately.  No clinical evidence of heart failure, he has not had any recurrence of angina pectoris, I had seen him about 2 months ago for musculoskeletal chest pain, he has not had any recurrence either.  I also reviewed the data from his recent ICD transmission.   Lipids are well controlled on Repatha.  No changes in the medications were done today otherwise.  I will see him back in 6 months or sooner if problems.  As it has been >1-year for his echocardiogram, we will repeat an echocardiogram to reevaluate his LV systolic function especially in view of dizziness and low blood pressure.   [name], PA-C 10/11/2020, 9:19 AM Office: [phone]

## 2020-11-24 ENCOUNTER — Other Ambulatory Visit: Payer: Self-pay | Admitting: Cardiology

## 2020-12-25 ENCOUNTER — Other Ambulatory Visit: Payer: Self-pay | Admitting: Internal Medicine

## 2020-12-25 DIAGNOSIS — R3129 Other microscopic hematuria: Secondary | ICD-10-CM

## 2020-12-27 ENCOUNTER — Other Ambulatory Visit: Payer: Self-pay

## 2020-12-27 DIAGNOSIS — E7800 Pure hypercholesterolemia, unspecified: Secondary | ICD-10-CM

## 2020-12-27 DIAGNOSIS — I2510 Atherosclerotic heart disease of native coronary artery without angina pectoris: Secondary | ICD-10-CM

## 2020-12-27 MED ORDER — REPATHA SURECLICK 140 MG/ML ~~LOC~~ SOAJ: 140.0000 mg | SUBCUTANEOUS | 6 refills | Status: DC

## 2021-01-02 ENCOUNTER — Telehealth: Payer: Self-pay

## 2021-01-02 NOTE — Telephone Encounter (Signed)
Looks like patient's PCP did labs last month and his LDL was not at goal, which is <70.  Please advise patient to follow-up with his PCP to inquire if PCP has instructed him to restart Crestor and Zetia.

## 2021-01-02 NOTE — Telephone Encounter (Signed)
Patient called his pharamcy asking for his crestor and zetia. He said he was told to re start these due to high cholesterol,  Didn't see that mentioned in OV and also he is on Repatha , please advise. Thanks

## 2021-01-04 ENCOUNTER — Other Ambulatory Visit: Payer: Self-pay

## 2021-01-04 DIAGNOSIS — E782 Mixed hyperlipidemia: Secondary | ICD-10-CM

## 2021-01-04 MED ORDER — EZETIMIBE 10 MG PO TABS: 10.0000 mg | ORAL_TABLET | Freq: Every day | ORAL | 3 refills | Status: DC

## 2021-01-04 MED ORDER — ROSUVASTATIN CALCIUM 10 MG PO TABS: 10.0000 mg | ORAL_TABLET | Freq: Every day | ORAL | 3 refills | Status: DC

## 2021-01-07 ENCOUNTER — Other Ambulatory Visit: Payer: Self-pay

## 2021-01-07 ENCOUNTER — Ambulatory Visit: Payer: Medicare Other

## 2021-01-08 ENCOUNTER — Ambulatory Visit: Payer: Medicare Other

## 2021-01-09 ENCOUNTER — Ambulatory Visit
Admission: RE | Admit: 2021-01-09 | Discharge: 2021-01-09 | Disposition: A | Discharge status: Home / Self Care | Payer: Medicare Other | Source: Ambulatory Visit | Attending: Internal Medicine | Admitting: Internal Medicine

## 2021-01-09 ENCOUNTER — Other Ambulatory Visit: Payer: Self-pay

## 2021-01-09 DIAGNOSIS — R3129 Other microscopic hematuria: Secondary | ICD-10-CM

## 2021-01-11 ENCOUNTER — Ambulatory Visit: Payer: Medicare Other | Admitting: Cardiology

## 2021-01-23 ENCOUNTER — Ambulatory Visit: Payer: Medicare Other | Admitting: Cardiology

## 2021-02-13 ENCOUNTER — Other Ambulatory Visit: Payer: Self-pay

## 2021-02-13 ENCOUNTER — Encounter: Payer: Self-pay | Admitting: Cardiology

## 2021-02-13 ENCOUNTER — Ambulatory Visit: Payer: Medicare Other | Admitting: Cardiology

## 2021-02-13 VITALS — BP 107/70 | HR 74 | Temp 98.3°F | Resp 16 | Ht 68.0 in | Wt 180.6 lb

## 2021-02-13 DIAGNOSIS — Z95810 Presence of automatic (implantable) cardiac defibrillator: Secondary | ICD-10-CM

## 2021-02-13 DIAGNOSIS — I2510 Atherosclerotic heart disease of native coronary artery without angina pectoris: Secondary | ICD-10-CM

## 2021-02-13 DIAGNOSIS — I5022 Chronic systolic (congestive) heart failure: Secondary | ICD-10-CM

## 2021-02-13 DIAGNOSIS — E782 Mixed hyperlipidemia: Secondary | ICD-10-CM

## 2021-02-13 MED ORDER — REPATHA PUSHTRONEX SYSTEM 420 MG/3.5ML ~~LOC~~ SOCT
1.0000 | SUBCUTANEOUS | 3 refills | Status: DC
Start: 2021-02-13 — End: 2021-12-30

## 2021-02-13 NOTE — Patient Instructions (Signed)
I would like you to try taking 2 tablets of carvedilol 3.125 mg twice a day.  If you tolerate this and your blood pressure is stable, after 1 month, I would like you to try taking Entresto 49/51 mg dose, sample has been given to you to try and see whether you would tolerate this.  If you tolerate the medication, please send a MyChart message so I can refill the prescription for the higher strength.  It was great to see you today.

## 2021-02-13 NOTE — Progress Notes (Signed)
Primary Physician/Referring:  [name], MD  Patient ID: Luis [name], male    DOB: 1946/04/10, 75 y.o.   MRN: [id number]  Chief Complaint  Patient presents with  . Coronary Artery Disease  . Congestive Heart Failure  . Follow-up    6 months    HPI: Luis [name]  is a 75 y.o. male  with coronary artery disease with history of emergent CABG with LIMA to LAD and SVG to OM by Luis [name]. His past medical history includes hyperlipidemia. Underwent single chamber Medtronic ICD implantation on 12/15/2017 for primary prevention in view of persistent low LVEF.   He presents for his routine 6 month visit. He has not had any recurrence of chest pain.  Denies dyspnea, PND or orthopnea or leg edema. No syncope or palpitations.  He would like to change his Repatha from every 2 weeks to every month dose.  Past Medical History:  Diagnosis Date  . AICD (automatic cardioverter/defibrillator) present 12/15/2017  . AKI (acute kidney injury) ([place]) 03/28/2017  . Cardiogenic shock ([place]) 03/28/2017  . Chronic combined systolic and diastolic heart failure ([place]) 05/20/2019  . Chronic kidney disease    kidney stones; noted per H&P per Dr [name] 02/06/2015 chronic kidney disease stage II  . Dyslipidemia   . Encounter for assessment of implantable cardioverter-defibrillator (ICD) 05/20/2019  . History of hepatomegaly    with fatty liver discussed per H&P per Dr [name] 02/06/2015 secondary to abd ultrasound   . History of kidney stones   . LVAD (left ventricular assist device) present ([place])   . Pneumothorax on right   . Sleep apnea    AHI 17 on sleep study 08/23/2013 pt states was given CPAP machine but sent it back - does not use now 12/15/2017  . 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  . Vitamin D deficiency    as noted per H&P per Dr [name] 02/06/2015    Past Surgical History:  Procedure Laterality Date  . ASD REPAIR N/A 03/25/2017   Procedure: ATRIAL SEPTAL DEFECT (ASD) REPAIR;   Surgeon: [name], MD;  Location: [facility];  Service: Open Heart Surgery;  Laterality: N/A;  . BIOPSY  03/10/2019   Procedure: BIOPSY;  Surgeon: Juanita Craver, MD;  [facility];  Service: Endoscopy;;  . CARDIAC DEFIBRILLATOR PLACEMENT  12/15/2017  . COLONOSCOPY WITH PROPOFOL N/A 03/10/2019   Procedure: COLONOSCOPY WITH PROPOFOL;  Surgeon: Juanita Craver, MD;  [facility];  Service: Endoscopy;  Laterality: N/A;  . CORONARY ARTERY BYPASS GRAFT N/A 03/25/2017   Procedure: CORONARY ARTERY BYPASS GRAFTING (CABG) x 2 using left internal mammary artery and right greater saphenous leg vein using endosciope.;  Surgeon: [name], MD;  Location: [facility];  Service: Open Heart Surgery;  Laterality: N/A;  . CYSTOSCOPY W/ RETROGRADES Left 03/26/2015   Procedure: CYSTOSCOPY WITH RETROGRADE PYELOGRAM;  Surgeon: [name], MD;  [facility];  Service: Urology;  Laterality: Left;  . ICD IMPLANT N/A 12/15/2017   Procedure: ICD IMPLANT;  Surgeon: [name], MD;  Location: [facility];  Service: Cardiovascular;  Laterality: N/A;  . LEFT HEART CATH AND CORONARY ANGIOGRAPHY N/A 03/25/2017   Procedure: Left Heart Cath and Coronary Angiography;  Surgeon: [name], MD;  Location: [facility];  Service: Cardiovascular;  Laterality: N/A;  . PLACEMENT OF IMPELLA LEFT VENTRICULAR ASSIST DEVICE  03/25/2017   Procedure: PLACEMENT OF IMPELLA LEFT VENTRICULAR ASSIST DEVICE;  Surgeon: [name], MD;  Location: [facility];  Service: Open Heart Surgery;;  . REMOVAL OF IMPELLA LEFT VENTRICULAR ASSIST DEVICE N/A 03/31/2017   Procedure: REMOVAL OF IMPELLA LEFT VENTRICULAR ASSIST DEVICE;  Surgeon: [name], MD;  Location: [facility];  Service: Open Heart Surgery;  Laterality: N/A;  . ROBOT ASSISTED PYELOPLASTY Left 03/26/2015   Procedure: ROBOTIC ASSISTED PYELOPLASTY WITH STENT PLACEMENT, REMOVAL OF STONE AND CYST DECORTICATION;  Surgeon: [name], MD;  [facility];   Service: Urology;  Laterality: Left;  . TEE WITHOUT CARDIOVERSION N/A 03/25/2017   Procedure: TRANSESOPHAGEAL ECHOCARDIOGRAM (TEE);  Surgeon: [name], [name], MD;  Location: [facility];  Service: Open Heart Surgery;  Laterality: N/A;  . TEE WITHOUT CARDIOVERSION N/A 03/31/2017   Procedure: TRANSESOPHAGEAL ECHOCARDIOGRAM (TEE);  Surgeon: [name], [name], MD;  Location: [facility];  Service: Open Heart Surgery;  Laterality: N/A;   Social History   Tobacco Use  . Smoking status: Never Smoker  . Smokeless tobacco: Never Used  Substance Use Topics  . Alcohol use: No   Marital Status: Married   Review of Systems  Cardiovascular: Negative for chest pain, dyspnea on exertion and leg swelling.  Gastrointestinal: Negative for melena.   Objective  Blood pressure 107/70, pulse 74, temperature 98.3 F (36.8 C), temperature source Temporal, resp. rate 16, height 5' 8" (1.727 m), weight 180 lb 9.6 oz (81.9 kg), SpO2 98 %. Body mass index is 27.46 kg/m.   Vitals with BMI 02/13/2021 10/11/2020 09/10/2020  Height 5' 8" 5' 8" 5' 8"  Weight 180 lbs 10 oz 184 lbs 13 oz 185 lbs  BMI 27.47 03.83 33.83  Systolic 291 916 90  Diastolic 70 76 61  Pulse 74 71 85       Physical Exam Constitutional:      General: He is not in acute distress.    Appearance: Normal appearance. He is well-developed.  Cardiovascular:     Rate and Rhythm: Normal rate and regular rhythm.     Pulses: Normal pulses and intact distal pulses.     Heart sounds: Normal heart sounds. No murmur heard. No gallop.      Comments: No JVD. No pedal edema. Pulmonary:     Effort: Pulmonary effort is normal.     Breath sounds: Normal breath sounds.  Abdominal:     General: Bowel sounds are normal.     Palpations: Abdomen is soft.  Musculoskeletal:        General: Normal range of motion.  Skin:    General: Skin is warm and dry.     Capillary Refill: Capillary refill takes less than 2 seconds.  Neurological:     General: No focal deficit  present.     Mental Status: He is oriented to person, place, and time.     Laboratory examination:   CMP Latest Ref Rng & Units 04/25/2019 12/10/2017 04/17/2017  Glucose 70 - 99 mg/dL 108(H) 104(H) 93  BUN 8 - 23 mg/dL _0 Creatinine 0.61 - 1.24 mg/dL 1.18 1.00 1.19  Sodium 135 - 145 mmol/L 132(L) 140 134(L)  Potassium 3.5 - 5.1 mmol/L 5.1 5.1 4.3  Chloride 98 - 111 mmol/L 104 104 101  CO2 22 - 32 mmol/L 17(L) 23 24  Calcium 8.9 - 10.3 mg/dL 8.4(L) 9.4 8.5(L)  Total Protein 6.5 - 8.1 g/dL - - -  Total Bilirubin 0.3 - 1.2 mg/dL - - -  Alkaline Phos 38 - 126 U/L - - -  AST 15 - 41 U/L - - -  ALT  17 - 63 U/L - - -   CBC Latest Ref Rng & Units 04/25/2019 12/10/2017 04/16/2017  WBC 4.0 - 10.5 K/uL 12.0(H) 6.5 7.7  Hemoglobin 13.0 - 17.0 g/dL 14.8 13.8 9.9(L)  Hematocrit 39.0 - 52.0 % 43.9 40.5 30.8(L)  Platelets 150 - 400 K/uL 180 243 414(H)   Lipid Panel     Component Value Date/Time   CHOL 114 02/16/2020 0823   TRIG 135 02/16/2020 0823   HDL 38 (L) 02/16/2020 0823   LDLCALC 52 02/16/2020 0823    External Labs:   Labs 12/14/2020:  Total cholesterol 163, triglycerides 138, HDL 36, LDL 99.  Hb 14.9/HCT 44.3, platelets 285, normal indicis.  Potassium 5.3, BUN 11, creatinine 1.05, EGFR 70 mL, serum glucose 103 mg.  CMP otherwise normal.  Labs 05/03/2019: Potassium 5.2, BUN 14, creatinine 0.9, eGFR> 60 mL, CMP otherwise normal.  A1c 6.1%.  Labs 03/17/2019: Total cholesterol 114, triglycerides 180, HDL 41, LDL 37.  Non-HDL cholesterol 73.  Medications   Allergies  Allergen Reactions  . Contrast Media [Iodinated Diagnostic Agents] Itching    Pt states he gets itchy when he has contrast.   . Crestor [Rosuvastatin Calcium] Other (See Comments)    myalgias     Current Meds  Medication Sig  . aspirin EC 81 MG tablet Take 81 mg by mouth daily.  . carvedilol (COREG) 3.125 MG tablet TAKE 1 TABLET (3.125 MG TOTAL) BY MOUTH 2 (TWO) TIMES DAILY.  . cholecalciferol (VITAMIN  D) 1000 units tablet Take 1,000 Units by mouth daily.  . Coenzyme Q10 (COQ10) 200 MG CAPS Take 200 mg by mouth daily.  . Evolocumab with Infusor ([place]) 420 MG/3.5ML SOCT Inject 1 Cartridge into the skin every 30 (thirty) days.  Marland Kitchen ezetimibe (ZETIA) 10 MG tablet Take 1 tablet (10 mg total) by mouth daily.  . fenofibrate (TRICOR) 48 MG tablet Take 1 tablet (48 mg total) by mouth daily with lunch.  . nitroGLYCERIN (NITROSTAT) 0.4 MG SL tablet Place 1 tablet (0.4 mg total) under the tongue every 5 (five) minutes as needed for chest pain.  . rosuvastatin (CRESTOR) 10 MG tablet Take 1 tablet (10 mg total) by mouth daily.  . sacubitril-valsartan (ENTRESTO) 24-26 MG Take 1 tablet by mouth 2 (two) times daily.  . vitamin B-12 (CYANOCOBALAMIN) 1000 MCG tablet Take 1,000 mcg by mouth daily.  . [DISCONTINUED] Evolocumab (REPATHA SURECLICK) 122 MG/ML SOAJ Inject 140 mg into the skin every 14 (fourteen) days.    Radiology:   No results found.  Cardiac Studies:   Coronary angiogram 03/25/2017: Mild decrease in LV systolic function, EF 48% with inferior, inferoapical severe hypokinesis. LAD CTO midsegment just after origin of a large D1, short segment occlusion with type II collaterals from the RCA. Bridging Collaterals also evident. Mid circumflex very large with a 75% stenosis. Large OM1. Mild disease. RCA mild diffuse disease, large vessel. Type II collaterals to the LAD. Distal small PL secondary branch is occluded and has collaterals from left to right.   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  Exercise tetrofosmin stress test  08/08/2019: Normal ECG stress. Patinet exercised for a total of 5 minutes and 59 seconds, achieving approximately 7.05 METs. Baseline heart rate was measured at 74 bpm. A maximum heart rate of 132 beats per minute was achieved, which is 89% of maximum predicted heart rate. Normal BP resonse.  Mild degree  medium extent mildly abnormal perfusion consistent with mild (reversible)  ischemia located in the mid anteroseptal wall and basal anterior wall (Left Anterior Descending Artery region) of left ventricle. Akinesis of the apical anterior wall, apical cap, apical septal wall, mid anteroseptal wall, mid anterior wall, basal anteroseptal wall and basal anterior wall of the left ventricle.  Stress LV EF: 26%.  High risk sutyd due to severe decrease in LV systolic function and minimal ischemia.  No previous exam available for comparison.  Echocardiogram 01/08/2021: Study Quality: Technically difficult study. Moderately depressed LV systolic function with visual EF 30-35%. Left ventricle cavity is normal in size. Mild to moderate left ventricular hypertrophy. Hypokinetic global wall motion. Doppler evidence of grade I (impaired) diastolic dysfunction, normal LAP.  Compared to prior study dated 07/20/2019: no significant change.  ICD:   Scheduled Remote ICD check  01/17/2021:    No VHR episodes, no mode switches. OptiVol Fluid Index do no present significant abnormalities. Battery longevity is >8 years. RV pacing is <0.1 %.  EKG:   EKG 02/13/2021: Normal sinus rhythm at rate of 71 bpm, normal axis.  Incomplete right bundle branch block.  Diffuse nonspecific T abnormality, cannot exclude anterolateral ischemia.    No significant change from EKG 09/10/2020   Assessment     ICD-10-CM   1. Coronary artery disease involving native coronary artery of native heart without angina pectoris  I25.10 EKG 12-Lead    LDL cholesterol, direct    Lipid Panel With LDL/HDL Ratio    Evolocumab with Infusor ([place]) 420 MG/3.5ML SOCT  2. Chronic systolic heart failure (HCC)  I50.22   3. ICD: Medtronic  single chamber Visia AF MRI VR [id number] ICD -  12/15/2017   Z95.810   4. Mixed hyperlipidemia  E78.2 LDL cholesterol, direct    Lipid Panel With LDL/HDL Ratio    Evolocumab with Infusor ([place]) 420 MG/3.5ML SOCT    Meds ordered this encounter  Medications  . Evolocumab with Infusor ([place]) 420 MG/3.5ML SOCT    Sig: Inject 1 Cartridge into the skin every 30 (thirty) days.    Dispense:  11.5 mL    Refill:  3   Medications Discontinued During This Encounter  Medication Reason  . Evolocumab (REPATHA SURECLICK) 425 MG/ML SOAJ Change in therapy   Recommendations:   Luis [name]  is a 75 y.o. male  with coronary artery disease with history of emergent CABG with LIMA to LAD and SVG to OM by Luis [name]. His past medical history includes hyperlipidemia. Underwent single chamber Medtronic ICD implantation on 12/15/2017 for primary prevention in view of persistent low LVEF.   Previously he has not been able to tolerate higher doses of carvedilol or Entresto due to low blood pressure.  Advised him that he should try to increase carvedilol to 6.25 mg twice daily and if he tolerates this and blood pressure is stable without dizziness, a month later he could try increasing Entresto to 49/51 mg dose.  Otherwise he is stable from cardiac standpoint without clinical evidence of heart failure, ICD data reviewed with the patient.  His LDL has risen, we will recheck his lipids.  He is presently on Repatha and also on moderate dose Lipitor along with Zetia as he could not tolerate high-dose Crestor.  He prefers to change his Repatha to monthly dose.  Rx sent.  I will see him back in 6 months for follow-up.  Reviewed his echocardiogram, LVEF is remained stable.     [name], PA-C 02/13/2021, 10:34 PM Office:  [phone]

## 2021-02-15 LAB — LIPID PANEL WITH LDL/HDL RATIO
Cholesterol, Total: 97 mg/dL — ABNORMAL LOW (ref 100–199)
HDL: 41 mg/dL (ref >39)
LDL Chol Calc (NIH): 35 mg/dL (ref 0–99)
LDL/HDL Ratio: 0.9 ratio (ref 0.0–3.6)
Triglycerides: 115 mg/dL (ref 0–149)
VLDL Cholesterol Cal: 21 mg/dL (ref 5–40)

## 2021-02-15 LAB — LDL CHOLESTEROL, DIRECT: LDL Direct: 35 mg/dL (ref 0–99)

## 2021-02-15 NOTE — Progress Notes (Signed)
Labs 12/14/2020:   Total cholesterol 163, triglycerides 138, HDL 36, LDL 99.  Comparing to this, today the lipids are normal and controlled.   Now lipids are normal with Crestor 10, zetia 10 and Repatha

## 2021-02-19 ENCOUNTER — Telehealth: Payer: Self-pay

## 2021-02-19 ENCOUNTER — Other Ambulatory Visit: Payer: Self-pay | Admitting: Cardiology

## 2021-02-19 MED ORDER — CARVEDILOL 6.25 MG PO TABS: 6.2500 mg | ORAL_TABLET | Freq: Two times a day (BID) | ORAL | 3 refills | Status: DC

## 2021-03-04 ENCOUNTER — Ambulatory Visit: Payer: Medicare Other | Admitting: Orthopedic Surgery

## 2021-03-04 DIAGNOSIS — M79604 Pain in right leg: Secondary | ICD-10-CM | POA: Diagnosis not present

## 2021-03-04 DIAGNOSIS — M79605 Pain in left leg: Secondary | ICD-10-CM | POA: Diagnosis not present

## 2021-03-04 NOTE — Telephone Encounter (Signed)
error 

## 2021-03-07 ENCOUNTER — Encounter: Payer: Self-pay | Admitting: Orthopedic Surgery

## 2021-03-07 ENCOUNTER — Ambulatory Visit: Payer: Medicare Other | Admitting: Physical Therapy

## 2021-03-07 NOTE — Progress Notes (Signed)
Office Visit Note   Patient: Luis [name]           Date of Birth: 11-10-1945           MRN: [id number] Visit Date: 03/04/2021 Requested by: [name], MD [id number] [place],  [place] [address] PCP: [name], MD  Subjective: Chief Complaint  Patient presents with   Lower Back - Pain    HPI: Patient presents for evaluation of back pain.  Denies any history of injury.  Reports recurrent back pain for about a month.  Describes some radiation into the right leg but no numbness and tingling.  Pain does not wake him from sleep at night.  Denies any fevers or chills.  Takes Tylenol occasionally.  Had prior physical therapy for same issue with good relief.  He is retired.              ROS: All systems reviewed are negative as they relate to the chief complaint within the history of present illness.  Patient denies  fevers or chills.   Assessment & Plan: Visit Diagnoses:  1. Bilateral leg pain     Plan: Impression is low back pain.  Plan is therapy here 1-2 times a week for 1 to 2 weeks plus home exercise program.  If he is not improved with that intervention we could consider imaging and injections.  Follow-up in 6 or 8 weeks after therapy if not improved.  No red flag symptoms today.  Follow-Up Instructions: Return if symptoms worsen or fail to improve.   Orders:  Orders Placed This Encounter  Procedures   Ambulatory referral to Physical Therapy   No orders of the defined types were placed in this encounter.     Procedures: No procedures performed   Clinical Data: No additional findings.  Objective: Vital Signs: There were no vitals taken for this visit.  Physical Exam:   Constitutional: Patient appears well-developed HEENT:  Head: Normocephalic Eyes:EOM are normal Neck: Normal range of motion Cardiovascular: Normal rate Pulmonary/chest: Effort normal Neurologic: Patient is alert Skin: Skin is warm Psychiatric: Patient has normal mood and  affect   Ortho Exam: Ortho exam demonstrates full active and passive range of motion of knees ankles and hips.  No nerve root tension signs.  No definite paresthesias L1 S1 bilaterally.  Mild pain with forward lateral bending.  No trochanteric tenderness.  Reflexes symmetric.  No muscle atrophy in either leg.  No other masses lymphadenopathy or skin changes noted in that back region.  Specialty Comments:  No specialty comments available.  Imaging: No results found.   PMFS History: Patient Active Problem List   Diagnosis Date Noted   Chronic combined systolic and diastolic heart failure ([place]) 05/20/2019   Encounter for assessment of implantable cardioverter-defibrillator (ICD) 05/20/2019   Ischemic cardiomyopathy 12/15/2017   ICD: Medtronic  single chamber Visia AF MRI VR [id number] ICD -  12/15/2017  12/15/2017   Dyspnea 04/16/2017   Acute combined systolic and diastolic heart failure ([place]) 04/16/2017   S/P CABG x 2 03/28/2017   Acute respiratory failure with hypoxia ([place]) 03/28/2017   Coronary artery disease 03/25/2017   Past Medical History:  Diagnosis Date   AICD (automatic cardioverter/defibrillator) present 12/15/2017   AKI (acute kidney injury) ([place]) 03/28/2017   Cardiogenic shock ([place]) 03/28/2017   Chronic combined systolic and diastolic heart failure ([place]) 05/20/2019   Chronic kidney disease    kidney stones; noted per H&P per Dr [name] 02/06/2015 chronic kidney disease stage II  Dyslipidemia    Encounter for assessment of implantable cardioverter-defibrillator (ICD) 05/20/2019   History of hepatomegaly    with fatty liver discussed per H&P per Dr [name] 02/06/2015 secondary to abd ultrasound    History of kidney stones    LVAD (left ventricular assist device) present ([place])    Pneumothorax on right    Sleep apnea    AHI 17 on sleep study 08/23/2013 pt states was given CPAP machine but sent it back - does not use now 12/15/2017   Ureteropelvic junction (UPJ) obstruction  03/26/2015   Vitamin D deficiency    as noted per H&P per Dr [name] 02/06/2015    Family History  Problem Relation Age of Onset   Healthy Mother    Healthy Father    Healthy Sister    Healthy Brother     Past Surgical History:  Procedure Laterality Date   ASD REPAIR N/A 03/25/2017   Procedure: ATRIAL SEPTAL DEFECT (ASD) REPAIR;  Surgeon: [name], MD;  Location: [facility];  Service: Open Heart Surgery;  Laterality: N/A;   BIOPSY  03/10/2019   Procedure: BIOPSY;  Surgeon: Juanita Craver, MD;  [facility];  Service: Endoscopy;;   CARDIAC DEFIBRILLATOR PLACEMENT  12/15/2017   COLONOSCOPY WITH PROPOFOL N/A 03/10/2019   Procedure: COLONOSCOPY WITH PROPOFOL;  Surgeon: Juanita Craver, MD;  [facility];  Service: Endoscopy;  Laterality: N/A;   CORONARY ARTERY BYPASS GRAFT N/A 03/25/2017   Procedure: CORONARY ARTERY BYPASS GRAFTING (CABG) x 2 using left internal mammary artery and right greater saphenous leg vein using endosciope.;  Surgeon: [name], MD;  Location: [facility];  Service: Open Heart Surgery;  Laterality: N/A;   CYSTOSCOPY W/ RETROGRADES Left 03/26/2015   Procedure: CYSTOSCOPY WITH RETROGRADE PYELOGRAM;  Surgeon: [name], MD;  [facility];  Service: Urology;  Laterality: Left;   ICD IMPLANT N/A 12/15/2017   Procedure: ICD IMPLANT;  Surgeon: [name], MD;  Location: [facility];  Service: Cardiovascular;  Laterality: N/A;   LEFT HEART CATH AND CORONARY ANGIOGRAPHY N/A 03/25/2017   Procedure: Left Heart Cath and Coronary Angiography;  Surgeon: [name], MD;  Location: [facility];  Service: Cardiovascular;  Laterality: N/A;   PLACEMENT OF IMPELLA LEFT VENTRICULAR ASSIST DEVICE  03/25/2017   Procedure: PLACEMENT OF IMPELLA LEFT VENTRICULAR ASSIST DEVICE;  Surgeon: [name], MD;  Location: [facility];  Service: Open Heart Surgery;;   REMOVAL OF IMPELLA LEFT VENTRICULAR ASSIST DEVICE N/A 03/31/2017   Procedure: REMOVAL OF  IMPELLA LEFT VENTRICULAR ASSIST DEVICE;  Surgeon: [name], MD;  Location: [facility];  Service: Open Heart Surgery;  Laterality: N/A;   ROBOT ASSISTED PYELOPLASTY Left 03/26/2015   Procedure: ROBOTIC ASSISTED PYELOPLASTY WITH STENT PLACEMENT, REMOVAL OF STONE AND CYST DECORTICATION;  Surgeon: [name], MD;  [facility];  Service: Urology;  Laterality: Left;   TEE WITHOUT CARDIOVERSION N/A 03/25/2017   Procedure: TRANSESOPHAGEAL ECHOCARDIOGRAM (TEE);  Surgeon: [name], [name], MD;  Location: [facility];  Service: Open Heart Surgery;  Laterality: N/A;   TEE WITHOUT CARDIOVERSION N/A 03/31/2017   Procedure: TRANSESOPHAGEAL ECHOCARDIOGRAM (TEE);  Surgeon: [name], [name], MD;  Location: [facility];  Service: Open Heart Surgery;  Laterality: N/A;   Social History   Occupational History   Not on file  Tobacco Use   Smoking status: Never   Smokeless tobacco: Never  Vaping Use   Vaping Use: Never used  Substance and Sexual Activity  Alcohol use: No   Drug use: No   Sexual activity: Not on file

## 2021-03-14 ENCOUNTER — Ambulatory Visit: Payer: Medicare Other | Admitting: Rehabilitative and Restorative Service Providers"

## 2021-03-14 ENCOUNTER — Encounter: Payer: Self-pay | Admitting: Rehabilitative and Restorative Service Providers"

## 2021-03-14 ENCOUNTER — Other Ambulatory Visit: Payer: Self-pay

## 2021-03-14 DIAGNOSIS — M545 Low back pain, unspecified: Secondary | ICD-10-CM

## 2021-03-14 DIAGNOSIS — M5416 Radiculopathy, lumbar region: Secondary | ICD-10-CM

## 2021-03-14 DIAGNOSIS — R293 Abnormal posture: Secondary | ICD-10-CM | POA: Diagnosis not present

## 2021-03-14 DIAGNOSIS — R262 Difficulty in walking, not elsewhere classified: Secondary | ICD-10-CM | POA: Diagnosis not present

## 2021-03-14 DIAGNOSIS — G8929 Other chronic pain: Secondary | ICD-10-CM

## 2021-03-14 NOTE — Therapy (Signed)
[facility] [address] [place], [place], [address] Phone: [phone]   Fax:  [phone]  Physical Therapy Evaluation  Patient Details  Name: Luis [name] MRN: [id number] Date of Birth: 1945/10/11 Referring Provider (PT): Meredith Pel MD   Encounter Date: 03/14/2021   PT End of Session - 03/14/21 1056     Visit Number 1    Number of Visits 12    Date for PT Re-Evaluation 06/06/21    PT Start Time 0934    PT Stop Time 1012    PT Time Calculation (min) 38 min    Activity Tolerance Patient tolerated treatment well;No increased pain    Behavior During Therapy [facility] for tasks assessed/performed             Past Medical History:  Diagnosis Date   AICD (automatic cardioverter/defibrillator) present 12/15/2017   AKI (acute kidney injury) ([place]) 03/28/2017   Cardiogenic shock ([place]) 03/28/2017   Chronic combined systolic and diastolic heart failure ([place]) 05/20/2019   Chronic kidney disease    kidney stones; noted per H&P per Dr [name] 02/06/2015 chronic kidney disease stage II   Dyslipidemia    Encounter for assessment of implantable cardioverter-defibrillator (ICD) 05/20/2019   History of hepatomegaly    with fatty liver discussed per H&P per Dr [name] 02/06/2015 secondary to abd ultrasound    History of kidney stones    LVAD (left ventricular assist device) present ([place])    Pneumothorax on right    Sleep apnea    AHI 17 on sleep study 08/23/2013 pt states was given CPAP machine but sent it back - does not use now 12/15/2017   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   Vitamin D deficiency    as noted per H&P per Dr [name] 02/06/2015    Past Surgical History:  Procedure Laterality Date   ASD REPAIR N/A 03/25/2017   Procedure: ATRIAL SEPTAL DEFECT (ASD) REPAIR;  Surgeon: [name], MD;  Location: [place];  Service: Open Heart Surgery;  Laterality: N/A;   BIOPSY  03/10/2019   Procedure: BIOPSY;  Surgeon: Juanita Craver,  MD;  [facility];  Service: Endoscopy;;   CARDIAC DEFIBRILLATOR PLACEMENT  12/15/2017   COLONOSCOPY WITH PROPOFOL N/A 03/10/2019   Procedure: COLONOSCOPY WITH PROPOFOL;  Surgeon: Juanita Craver, MD;  [facility];  Service: Endoscopy;  Laterality: N/A;   CORONARY ARTERY BYPASS GRAFT N/A 03/25/2017   Procedure: CORONARY ARTERY BYPASS GRAFTING (CABG) x 2 using left internal mammary artery and right greater saphenous leg vein using endosciope.;  Surgeon: [name], MD;  Location: [place];  Service: Open Heart Surgery;  Laterality: N/A;   CYSTOSCOPY W/ RETROGRADES Left 03/26/2015   Procedure: CYSTOSCOPY WITH RETROGRADE PYELOGRAM;  Surgeon: [name], MD;  [facility];  Service: Urology;  Laterality: Left;   ICD IMPLANT N/A 12/15/2017   Procedure: ICD IMPLANT;  Surgeon: [name], MD;  Location: [place] [facility];  Service: Cardiovascular;  Laterality: N/A;   LEFT HEART CATH AND CORONARY ANGIOGRAPHY N/A 03/25/2017   Procedure: Left Heart Cath and Coronary Angiography;  Surgeon: [name], MD;  Location: [place] [facility];  Service: Cardiovascular;  Laterality: N/A;   PLACEMENT OF IMPELLA LEFT VENTRICULAR ASSIST DEVICE  03/25/2017   Procedure: PLACEMENT OF IMPELLA LEFT VENTRICULAR ASSIST DEVICE;  Surgeon: [name], MD;  Location: [place];  Service: Open Heart Surgery;;   REMOVAL OF IMPELLA LEFT VENTRICULAR ASSIST DEVICE N/A 03/31/2017   Procedure: REMOVAL OF  IMPELLA LEFT VENTRICULAR ASSIST DEVICE;  Surgeon: [name], MD;  Location: [place];  Service: Open Heart Surgery;  Laterality: N/A;   ROBOT ASSISTED PYELOPLASTY Left 03/26/2015   Procedure: ROBOTIC ASSISTED PYELOPLASTY WITH STENT PLACEMENT, REMOVAL OF STONE AND CYST DECORTICATION;  Surgeon: [name], MD;  [facility];  Service: Urology;  Laterality: Left;   TEE WITHOUT CARDIOVERSION N/A 03/25/2017   Procedure: TRANSESOPHAGEAL ECHOCARDIOGRAM (TEE);  Surgeon: [name], [name], MD;  Location:  [place];  Service: Open Heart Surgery;  Laterality: N/A;   TEE WITHOUT CARDIOVERSION N/A 03/31/2017   Procedure: TRANSESOPHAGEAL ECHOCARDIOGRAM (TEE);  Surgeon: [name], [name], MD;  Location: [place];  Service: Open Heart Surgery;  Laterality: N/A;    There were no vitals filed for this visit.    Subjective Assessment - 03/14/21 1051     Subjective Luis [name] has had chronic low back pain.  R sciatica is relatively new.  He would like to get back to the gym and his garden without flare-ups with his back or R thigh.    Pertinent History Heart failute, defibrillator, s/p CABG    Limitations Sitting;House hold activities;Lifting;Standing;Walking    How long can you sit comfortably? Needs to change position after 30 minutes    How long can you stand comfortably? OK as long as he avoids flexion    How long can you walk comfortably? Walking 1-3 miles OK    Diagnostic tests X-ray mild degenerative changes    Patient Stated Goals Return to the gym and gardening without restrictions    Currently in Pain? Yes    Pain Score 4     Pain Location Back    Pain Orientation Lower    Pain Descriptors / Indicators Aching;Tightness;Sore    Pain Type Chronic pain    Pain Radiating Towards R knee    Pain Onset More than a month ago    Pain Frequency Intermittent    Aggravating Factors  Prolonged postures and flexion    Effect of Pain on Daily Activities Unable to fully participate in his chosen activities (garden and the gym)    Multiple Pain Sites No                [facility] PT Assessment - 03/14/21 0001       Assessment   Medical Diagnosis Low back pain    Referring Provider (PT) Meredith Pel MD    Onset Date/Surgical Date --   Chronic     Balance Screen   Has the patient fallen in the past 6 months No    Has the patient had a decrease in activity level because of a fear of falling?  No    Is the patient reluctant to leave their home because of a fear of falling?  No      Prior Function    Level of Independence Independent    Vocation Retired    Leisure Walking, works out at [employer] and works in the garden      Cognition   Overall Cognitive Status Within [employer] for tasks assessed      Observation/Other Assessments   Focus on Therapeutic Outcomes (FOTO)  60 (Goal 70)      Sensation   Additional Comments Occasional R sciatica to the knee      ROM / Strength   AROM / PROM / Strength AROM      AROM   Overall AROM  Deficits    AROM Assessment Site  Lumbar;Hip    Right/Left Hip Left;Right    Right Hip Flexion 110    Right Hip External Rotation  28    Right Hip Internal Rotation  18    Left Hip Flexion 100    Left Hip External Rotation  33    Left Hip Internal Rotation  16    Lumbar Extension 5      Flexibility   Soft Tissue Assessment /Muscle Length yes    Hamstrings 40 degrees B                        Objective measurements completed on examination: See above findings.       [place] Adult PT Treatment/Exercise - 03/14/21 0001       Posture/Postural Control   Posture/Postural Control Postural limitations    Postural Limitations Forward head;Rounded Shoulders;Decreased lumbar lordosis    Posture Comments Better when cued      Therapeutic Activites    Therapeutic Activities Other Therapeutic Activities;ADL's    ADL's Log roll    Other Therapeutic Activities Reviewed imaging, spine anatomy, posture and beginner body mechanics in the house and garden      Exercises   Exercises Lumbar      Lumbar Exercises: Stretches   Figure 4 Stretch 5 reps;20 seconds;With overpressure      Lumbar Exercises: Standing   Scapular Retraction Strengthening;Both;10 reps;Limitations    Scapular Retraction Limitations 5 seconds shoulder blade pinches    Other Standing Lumbar Exercises Trunk extension AROM 10X 3 seconds (hips forward)                    PT Education - 03/14/21 1055     Education Details Reviewed exam and starter HEP.   Reviewed imaging and went over basic spine anatomy, posture and body mechanics including log roll.    Person(s) Educated Patient    Methods Explanation;Demonstration;Tactile cues;Verbal cues;Handout    Comprehension Verbal cues required;Returned demonstration;Need further instruction;Tactile cues required;Verbalized understanding              PT Short Term Goals - 03/14/21 1657       PT SHORT TERM GOAL #1   Title Improve standing trunk extension AROM to 10 degrees.    Baseline 5 degrees    Time 4    Period Weeks    Status New    Target Date 04/11/21      PT SHORT TERM GOAL #2   Title Luis [name] will report no sciatica for at least 2 straight weeks.    Time 4    Period Weeks    Status New    Target Date 04/11/21               PT Long Term Goals - 03/14/21 1658       PT LONG TERM GOAL #1   Title Improve FOTO score to 70.    Baseline 60    Time 6    Period Weeks    Status New    Target Date 04/25/21      PT LONG TERM GOAL #2   Title Improve hip flexibility for ER to 40 degrees.    Baseline See objective.    Time 6    Period Weeks    Status New    Target Date 04/25/21      PT LONG TERM GOAL #3   Title Improve low back strength as assessed by FOTO, sellf-reported function and objective measures.  Time 6    Period Weeks    Status New    Target Date 04/25/21      PT LONG TERM GOAL #4   Title Luis [name] will be independent with his long-term HEP at [place].    Time 6    Period Weeks    Status New    Target Date 04/25/21                    Plan - 03/14/21 1654     Clinical Impression Statement Luis [name] has a history of chronic back pain.  R sciatica is newer and is keeping him from doing his normal activities.  Postural correction, low back strengthening and body mechanics work should allow him to meet LTGs.    Personal Factors and Comorbidities Comorbidity 3+    Comorbidities Heart failure, defibrillator, s/p CABG    Examination-Activity Limitations  Sleep;Bed Mobility;Bend;Lift;Squat;Locomotion Level    Examination-Participation Restrictions Church;Volunteer;Community Activity    Stability/Clinical Decision Making Stable/Uncomplicated    Clinical Decision Making Low    Rehab Potential Good    PT Frequency Other (comment)   1-2X/week for 4-6 weeks   PT Duration 6 weeks    PT Treatment/Interventions ADLs/Self Care Home Management;Cryotherapy;Traction;Therapeutic activities;Therapeutic exercise;Neuromuscular re-education;Patient/family education;Manual techniques;Dry needling    PT Next Visit Plan Review HEP, progress low back strength, practical lifting and body mechanics    PT Home Exercise Plan Access Code: [email or web address]    Consulted and Agree with Plan of Care Patient             Patient will benefit from skilled therapeutic intervention in order to improve the following deficits and impairments:  Abnormal gait, Decreased activity tolerance, Decreased endurance, Decreased range of motion, Decreased strength, Difficulty walking, Impaired flexibility, Postural dysfunction, Improper body mechanics, Pain  Visit Diagnosis: Abnormal posture  Difficulty walking  Radiculopathy, lumbar region  Chronic bilateral low back pain, unspecified whether sciatica present     Problem List Patient Active Problem List   Diagnosis Date Noted   Chronic combined systolic and diastolic heart failure ([place]) 05/20/2019   Encounter for assessment of implantable cardioverter-defibrillator (ICD) 05/20/2019   Ischemic cardiomyopathy 12/15/2017   ICD: Medtronic  single chamber Visia AF MRI VR [id number] ICD -  12/15/2017  12/15/2017   Dyspnea 04/16/2017   Acute combined systolic and diastolic heart failure ([facility]) 04/16/2017   S/P CABG x 2 03/28/2017   Acute respiratory failure with hypoxia ([facility]) 03/28/2017   Coronary artery disease 03/25/2017    Farley Ly PT, MPT 03/14/2021, 5:03 PM  [facility] [address] [name], [place], [address] Phone: [phone]   Fax:  [phone]  Name: Luis [name] MRN: [id number] Date of Birth: October 31, 1945

## 2021-03-14 NOTE — Patient Instructions (Signed)
Access Code: [email or web address] URL: [email or web address] Date: 03/14/2021 Prepared by: Vista Mink  Exercises Supine Figure 4 Piriformis Stretch - 2-3 x daily - 7 x weekly - 1 sets - 5 reps - 20 seconds hold Standing Lumbar Extension at [place] 5 x daily - 7 x weekly - 1 sets - 5 reps - 3 seconds hold Standing Scapular Retraction - 5 x daily - 7 x weekly - 1 sets - 5 reps - 5 second hold

## 2021-03-15 ENCOUNTER — Ambulatory Visit: Payer: Medicare Other | Admitting: Rehabilitative and Restorative Service Providers"

## 2021-03-15 ENCOUNTER — Encounter: Payer: Self-pay | Admitting: Rehabilitative and Restorative Service Providers"

## 2021-03-15 DIAGNOSIS — R262 Difficulty in walking, not elsewhere classified: Secondary | ICD-10-CM | POA: Diagnosis not present

## 2021-03-15 DIAGNOSIS — G8929 Other chronic pain: Secondary | ICD-10-CM

## 2021-03-15 DIAGNOSIS — R293 Abnormal posture: Secondary | ICD-10-CM

## 2021-03-15 DIAGNOSIS — M545 Low back pain, unspecified: Secondary | ICD-10-CM | POA: Diagnosis not present

## 2021-03-15 DIAGNOSIS — M5416 Radiculopathy, lumbar region: Secondary | ICD-10-CM

## 2021-03-15 DIAGNOSIS — M6281 Muscle weakness (generalized): Secondary | ICD-10-CM

## 2021-03-15 NOTE — Patient Instructions (Signed)
Access Code: [email or web address] URL: [email or web address] Date: 03/15/2021 Prepared by: Vista Mink  Exercises Supine Figure 4 Piriformis Stretch - 2-3 x daily - 7 x weekly - 1 sets - 5 reps - 20 seconds hold Standing Lumbar Extension at [place] - 5 x daily - 7 x weekly - 1 sets - 5 reps - 3 seconds hold Standing Scapular Retraction - 5 x daily - 7 x weekly - 1 sets - 5 reps - 5 second hold Supine Hamstring Stretch - 2-3 x daily - 7 x weekly - 1 sets - 5 reps - 20 seconds hold Prone Hip Extension - 1 x daily - 7 x weekly - 2-3 sets - 10 reps - 3 seconds hold Sit to Stand with Armchair - 2 x daily - 7 x weekly - 1 sets - 5-10 reps

## 2021-03-15 NOTE — Therapy (Signed)
[facility] [address] [place], [place], [address] Phone: [phone]   Fax:  [phone]  Physical Therapy Treatment  Patient Details  Name: Luis [name] MRN: [id number] Date of Birth: 1946-04-17 Referring Provider (PT): Meredith Pel MD   Encounter Date: 03/15/2021   PT End of Session - 03/15/21 1631     Visit Number 2    Number of Visits 12    Date for PT Re-Evaluation 06/06/21    PT Start Time 1100    PT Stop Time 1145    PT Time Calculation (min) 45 min    Activity Tolerance Patient tolerated treatment well;No increased pain    Behavior During Therapy [facility] for tasks assessed/performed             Past Medical History:  Diagnosis Date   AICD (automatic cardioverter/defibrillator) present 12/15/2017   AKI (acute kidney injury) ([place]) 03/28/2017   Cardiogenic shock ([place]) 03/28/2017   Chronic combined systolic and diastolic heart failure ([place]) 05/20/2019   Chronic kidney disease    kidney stones; noted per H&P per Dr [name] 02/06/2015 chronic kidney disease stage II   Dyslipidemia    Encounter for assessment of implantable cardioverter-defibrillator (ICD) 05/20/2019   History of hepatomegaly    with fatty liver discussed per H&P per Dr [name] 02/06/2015 secondary to abd ultrasound    History of kidney stones    LVAD (left ventricular assist device) present ([place])    Pneumothorax on right    Sleep apnea    AHI 17 on sleep study 08/23/2013 pt states was given CPAP machine but sent it back - does not use now 12/15/2017   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   Vitamin D deficiency    as noted per H&P per Dr [name] 02/06/2015    Past Surgical History:  Procedure Laterality Date   ASD REPAIR N/A 03/25/2017   Procedure: ATRIAL SEPTAL DEFECT (ASD) REPAIR;  Surgeon: [name], MD;  Location: [place];  Service: Open Heart Surgery;  Laterality: N/A;   BIOPSY  03/10/2019   Procedure: BIOPSY;  Surgeon: Juanita Craver,  MD;  [facility];  Service: Endoscopy;;   CARDIAC DEFIBRILLATOR PLACEMENT  12/15/2017   COLONOSCOPY WITH PROPOFOL N/A 03/10/2019   Procedure: COLONOSCOPY WITH PROPOFOL;  Surgeon: Juanita Craver, MD;  [facility];  Service: Endoscopy;  Laterality: N/A;   CORONARY ARTERY BYPASS GRAFT N/A 03/25/2017   Procedure: CORONARY ARTERY BYPASS GRAFTING (CABG) x 2 using left internal mammary artery and right greater saphenous leg vein using endosciope.;  Surgeon: [name], MD;  Location: [place];  Service: Open Heart Surgery;  Laterality: N/A;   CYSTOSCOPY W/ RETROGRADES Left 03/26/2015   Procedure: CYSTOSCOPY WITH RETROGRADE PYELOGRAM;  Surgeon: [name], MD;  [facility];  Service: Urology;  Laterality: Left;   ICD IMPLANT N/A 12/15/2017   Procedure: ICD IMPLANT;  Surgeon: [name], MD;  Location: [place] [facility];  Service: Cardiovascular;  Laterality: N/A;   LEFT HEART CATH AND CORONARY ANGIOGRAPHY N/A 03/25/2017   Procedure: Left Heart Cath and Coronary Angiography;  Surgeon: [name], MD;  Location: [place] [facility];  Service: Cardiovascular;  Laterality: N/A;   PLACEMENT OF IMPELLA LEFT VENTRICULAR ASSIST DEVICE  03/25/2017   Procedure: PLACEMENT OF IMPELLA LEFT VENTRICULAR ASSIST DEVICE;  Surgeon: [name], MD;  Location: [place];  Service: Open Heart Surgery;;   REMOVAL OF IMPELLA LEFT VENTRICULAR ASSIST DEVICE N/A 03/31/2017   Procedure: REMOVAL OF  IMPELLA LEFT VENTRICULAR ASSIST DEVICE;  Surgeon: [name], MD;  Location: [place];  Service: Open Heart Surgery;  Laterality: N/A;   ROBOT ASSISTED PYELOPLASTY Left 03/26/2015   Procedure: ROBOTIC ASSISTED PYELOPLASTY WITH STENT PLACEMENT, REMOVAL OF STONE AND CYST DECORTICATION;  Surgeon: [name], MD;  [facility];  Service: Urology;  Laterality: Left;   TEE WITHOUT CARDIOVERSION N/A 03/25/2017   Procedure: TRANSESOPHAGEAL ECHOCARDIOGRAM (TEE);  Surgeon: [name], [name], MD;  Location:  [place];  Service: Open Heart Surgery;  Laterality: N/A;   TEE WITHOUT CARDIOVERSION N/A 03/31/2017   Procedure: TRANSESOPHAGEAL ECHOCARDIOGRAM (TEE);  Surgeon: [name], [name], MD;  Location: [place];  Service: Open Heart Surgery;  Laterality: N/A;    There were no vitals filed for this visit.   Subjective Assessment - 03/15/21 1627     Subjective Buster reports good HEP compliance since starting PT yesterday.  No sciatica yesterday.    Pertinent History Heart failute, defibrillator, s/p CABG    Limitations Sitting;House hold activities;Lifting;Standing;Walking    How long can you sit comfortably? Needs to change position after 30 minutes    How long can you stand comfortably? OK as long as he avoids flexion    How long can you walk comfortably? Walking 1-3 miles OK    Diagnostic tests X-ray mild degenerative changes    Patient Stated Goals Return to the gym and gardening without restrictions    Currently in Pain? Yes    Pain Score 4     Pain Location Back    Pain Orientation Lower    Pain Descriptors / Indicators Aching;Sore;Tightness    Pain Type Chronic pain    Pain Radiating Towards R knee    Pain Onset More than a month ago    Pain Frequency Intermittent    Aggravating Factors  Prolonged postures and flexion    Effect of Pain on Daily Activities Unable to fully participate with the garden and gym    Multiple Pain Sites No                               [facility] Adult PT Treatment/Exercise - 03/15/21 0001       Posture/Postural Control   Posture/Postural Control Postural limitations    Postural Limitations Forward head;Rounded Shoulders;Decreased lumbar lordosis      Therapeutic Activites    Therapeutic Activities Lifting;Other Therapeutic Activities    ADL's Log roll, golfer's and diagonal squat lifts    Other Therapeutic Activities Reviewed spine anatomy      Exercises   Exercises Lumbar      Lumbar Exercises: Stretches   Active Hamstring Stretch  Left;Right;4 reps;20 seconds;Other (comment)    Active Hamstring Stretch Limitations Other leg straight    Figure 4 Stretch 5 reps;20 seconds;With overpressure      Lumbar Exercises: Standing   Scapular Retraction Strengthening;Both;10 reps;Limitations    Scapular Retraction Limitations 5 seconds shoulder blade pinches    Other Standing Lumbar Exercises Trunk extension AROM 10X 3 seconds (hips forward)      Lumbar Exercises: Seated   Sit to Stand 10 reps;Other (comment)    Sit to Stand Limitations No hands slow eccentrics, maintain inward lumbar curve      Lumbar Exercises: Prone   Straight Leg Raise 10 reps;3 seconds;Limitations    Straight Leg Raises Limitations 2 sets forehead on forearms  PT Education - 03/15/21 1630     Education Details Spent time reviewing anatomy and practical body mechanics.  Progressed strength and flexibility exercises.    Person(s) Educated Patient    Methods Explanation;Demonstration;Verbal cues;Handout    Comprehension Verbalized understanding;Returned demonstration;Need further instruction;Verbal cues required              PT Short Term Goals - 03/15/21 1630       PT SHORT TERM GOAL #1   Title Improve standing trunk extension AROM to 10 degrees.    Baseline 5 degrees    Time 4    Period Weeks    Status On-going    Target Date 04/11/21      PT SHORT TERM GOAL #2   Title Faraaz will report no sciatica for at least 2 straight weeks.    Time 4    Period Weeks    Status On-going    Target Date 04/11/21               PT Long Term Goals - 03/15/21 1631       PT LONG TERM GOAL #1   Title Improve FOTO score to 70.    Baseline 60    Time 6    Period Weeks    Status On-going      PT LONG TERM GOAL #2   Title Improve hip flexibility for ER to 40 degrees.    Baseline See objective.    Time 6    Period Weeks    Status On-going      PT LONG TERM GOAL #3   Title Improve low back strength as  assessed by FOTO, sellf-reported function and objective measures.    Time 6    Period Weeks    Status On-going      PT LONG TERM GOAL #4   Title Damany will be independent with his long-term HEP at [place].    Time 6    Period Weeks    Status On-going                   Plan - 03/15/21 1632     Clinical Impression Statement Bertrum reports early HEP compliance.  He needed correction with practical lifting and body mechanics work.  Mild correction with yesterday's activities.  Continue practical body mechanics work and progress low back and hip abductors strength next visit.    Personal Factors and Comorbidities Comorbidity 3+    Comorbidities Heart failure, defibrillator, s/p CABG    Examination-Activity Limitations Sleep;Bed Mobility;Bend;Lift;Squat;Locomotion Level    Examination-Participation Restrictions Church;Volunteer;Community Activity    Stability/Clinical Decision Making Stable/Uncomplicated    Rehab Potential Good    PT Frequency Other (comment)   1-2X/week for 4-6 weeks   PT Duration 6 weeks    PT Treatment/Interventions ADLs/Self Care Home Management;Cryotherapy;Traction;Therapeutic activities;Therapeutic exercise;Neuromuscular re-education;Patient/family education;Manual techniques;Dry needling    PT Next Visit Plan Review HEP, progress low back and hip abductors strength, practical lifting and body mechanics    PT Home Exercise Plan Access Code: [email or web address]    Consulted and Agree with Plan of Care Patient             Patient will benefit from skilled therapeutic intervention in order to improve the following deficits and impairments:  Abnormal gait, Decreased activity tolerance, Decreased endurance, Decreased range of motion, Decreased strength, Difficulty walking, Impaired flexibility, Postural dysfunction, Improper body mechanics, Pain  Visit Diagnosis: Abnormal posture  Difficulty walking  Radiculopathy, lumbar region  Chronic bilateral  low back pain,  unspecified whether sciatica present  Muscle weakness (generalized)     Problem List Patient Active Problem List   Diagnosis Date Noted   Chronic combined systolic and diastolic heart failure ([place]) 05/20/2019   Encounter for assessment of implantable cardioverter-defibrillator (ICD) 05/20/2019   Ischemic cardiomyopathy 12/15/2017   ICD: Medtronic  single chamber Visia AF MRI VR [id number] ICD -  12/15/2017  12/15/2017   Dyspnea 04/16/2017   Acute combined systolic and diastolic heart failure ([place]) 04/16/2017   S/P CABG x 2 03/28/2017   Acute respiratory failure with hypoxia ([place]) 03/28/2017   Coronary artery disease 03/25/2017    Farley Ly PT, MPT 03/15/2021, 4:34 PM  [facility] [address] [place], [place], [address] Phone: [phone]   Fax:  [phone]  Name: Hafiz Irion MRN: [id number] Date of Birth: 1946-08-11

## 2021-03-18 ENCOUNTER — Encounter: Payer: Self-pay | Admitting: Physical Therapy

## 2021-03-18 ENCOUNTER — Ambulatory Visit: Payer: Medicare Other | Admitting: Physical Therapy

## 2021-03-18 ENCOUNTER — Other Ambulatory Visit: Payer: Self-pay

## 2021-03-18 DIAGNOSIS — R262 Difficulty in walking, not elsewhere classified: Secondary | ICD-10-CM

## 2021-03-18 DIAGNOSIS — R293 Abnormal posture: Secondary | ICD-10-CM

## 2021-03-18 DIAGNOSIS — M6281 Muscle weakness (generalized): Secondary | ICD-10-CM

## 2021-03-18 DIAGNOSIS — M545 Low back pain, unspecified: Secondary | ICD-10-CM | POA: Diagnosis not present

## 2021-03-18 DIAGNOSIS — G8929 Other chronic pain: Secondary | ICD-10-CM

## 2021-03-18 DIAGNOSIS — M5416 Radiculopathy, lumbar region: Secondary | ICD-10-CM

## 2021-03-18 NOTE — Therapy (Addendum)
[facility] [address] [place], [place], [address] Phone: [phone]   Fax:  [phone]  Physical Therapy Treatment  Patient Details  Name: Luis [name] MRN: [id number] Date of Birth: [birth year] Referring Provider (PT): Meredith Pel MD  PHYSICAL THERAPY DISCHARGE SUMMARY  Visits from Start of Care: 3  Current functional level related to goals / functional outcomes: See note   Remaining deficits: See note   Education / Equipment: HEP   Patient agrees to discharge. Patient goals were partially met. Patient is being discharged due to not returning since the last visit.  Encounter Date: 03/18/2021   PT End of Session - 03/18/21 1506     Visit Number 3    Number of Visits 12    Date for PT Re-Evaluation 06/06/21    PT Start Time 8916    PT Stop Time 1505    PT Time Calculation (min) 40 min    Activity Tolerance Patient tolerated treatment well;No increased pain    Behavior During Therapy [facility] for tasks assessed/performed             Past Medical History:  Diagnosis Date   AICD (automatic cardioverter/defibrillator) present 12/15/2017   AKI (acute kidney injury) ([place]) 03/28/2017   Cardiogenic shock ([place]) 03/28/2017   Chronic combined systolic and diastolic heart failure ([place]) 05/20/2019   Chronic kidney disease    kidney stones; noted per H&P per Dr [name] 02/06/2015 chronic kidney disease stage II   Dyslipidemia    Encounter for assessment of implantable cardioverter-defibrillator (ICD) 05/20/2019   History of hepatomegaly    with fatty liver discussed per H&P per Dr [name] 02/06/2015 secondary to abd ultrasound    History of kidney stones    LVAD (left ventricular assist device) present ([place])    Pneumothorax on right    Sleep apnea    AHI 17 on sleep study 08/23/2013 pt states was given CPAP machine but sent it back - does not use now 12/15/2017   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   Vitamin D  deficiency    as noted per H&P per Dr [name] 02/06/2015    Past Surgical History:  Procedure Laterality Date   ASD REPAIR N/A 03/25/2017   Procedure: ATRIAL SEPTAL DEFECT (ASD) REPAIR;  Surgeon: [name], MD;  Location: [place];  Service: Open Heart Surgery;  Laterality: N/A;   BIOPSY  03/10/2019   Procedure: BIOPSY;  Surgeon: Juanita Craver, MD;  [facility];  Service: Endoscopy;;   CARDIAC DEFIBRILLATOR PLACEMENT  12/15/2017   COLONOSCOPY WITH PROPOFOL N/A 03/10/2019   Procedure: COLONOSCOPY WITH PROPOFOL;  Surgeon: Juanita Craver, MD;  [facility];  Service: Endoscopy;  Laterality: N/A;   CORONARY ARTERY BYPASS GRAFT N/A 03/25/2017   Procedure: CORONARY ARTERY BYPASS GRAFTING (CABG) x 2 using left internal mammary artery and right greater saphenous leg vein using endosciope.;  Surgeon: [name], MD;  Location: [place];  Service: Open Heart Surgery;  Laterality: N/A;   CYSTOSCOPY W/ RETROGRADES Left 03/26/2015   Procedure: CYSTOSCOPY WITH RETROGRADE PYELOGRAM;  Surgeon: [name], MD;  [facility];  Service: Urology;  Laterality: Left;   ICD IMPLANT N/A 12/15/2017   Procedure: ICD IMPLANT;  Surgeon: [name], MD;  Location: [place] [facility];  Service: Cardiovascular;  Laterality: N/A;   LEFT HEART CATH AND CORONARY ANGIOGRAPHY N/A 03/25/2017   Procedure: Left Heart Cath and Coronary Angiography;  Surgeon: [name], MD;  Location: [place] [facility];  Service: Cardiovascular;  Laterality: N/A;   PLACEMENT OF IMPELLA LEFT VENTRICULAR ASSIST DEVICE  03/25/2017   Procedure: PLACEMENT OF IMPELLA LEFT VENTRICULAR ASSIST DEVICE;  Surgeon: [name], MD;  Location: [place];  Service: Open Heart Surgery;;   REMOVAL OF IMPELLA LEFT VENTRICULAR ASSIST DEVICE N/A 03/31/2017   Procedure: REMOVAL OF IMPELLA LEFT VENTRICULAR ASSIST DEVICE;  Surgeon: [name], MD;  Location: [place];  Service: Open Heart Surgery;  Laterality: N/A;   ROBOT ASSISTED  PYELOPLASTY Left 03/26/2015   Procedure: ROBOTIC ASSISTED PYELOPLASTY WITH STENT PLACEMENT, REMOVAL OF STONE AND CYST DECORTICATION;  Surgeon: [name], MD;  [facility];  Service: Urology;  Laterality: Left;   TEE WITHOUT CARDIOVERSION N/A 03/25/2017   Procedure: TRANSESOPHAGEAL ECHOCARDIOGRAM (TEE);  Surgeon: [name], [name], MD;  Location: [place];  Service: Open Heart Surgery;  Laterality: N/A;   TEE WITHOUT CARDIOVERSION N/A 03/31/2017   Procedure: TRANSESOPHAGEAL ECHOCARDIOGRAM (TEE);  Surgeon: [name], [name], MD;  Location: [place];  Service: Open Heart Surgery;  Laterality: N/A;    There were no vitals filed for this visit.   Subjective Assessment - 03/18/21 1428     Subjective doing well today, back is still painful but no radicular symptoms today    Pertinent History Heart failute, defibrillator, s/p CABG    Limitations Sitting;House hold activities;Lifting;Standing;Walking    How long can you sit comfortably? Needs to change position after 30 minutes    How long can you stand comfortably? OK as long as he avoids flexion    How long can you walk comfortably? Walking 1-3 miles OK    Diagnostic tests X-ray mild degenerative changes    Patient Stated Goals Return to the gym and gardening without restrictions    Currently in Pain? Yes    Pain Score 4     Pain Location Back    Pain Orientation Lower    Pain Descriptors / Indicators Aching;Sore;Tightness    Pain Type Chronic pain    Pain Onset More than a month ago    Pain Frequency Intermittent    Aggravating Factors  prolonged postures and flexion    Pain Relieving Factors stretches                               [facility] Adult PT Treatment/Exercise - 03/18/21 1429       Lumbar Exercises: Stretches   Active Hamstring Stretch Right;Left;3 reps;30 seconds    Active Hamstring Stretch Limitations supine with strap    Figure 4 Stretch 3 reps;30 seconds;With overpressure;Supine      Lumbar Exercises:  Aerobic   Nustep L7 x 8 min      Lumbar Exercises: Standing   Heel Raises 20 reps    Other Standing Lumbar Exercises Trunk extension AROM 10X 3 seconds (hips forward)    Other Standing Lumbar Exercises hip abduction and extension x10 reps each bil - min cues for posture and core activation      Lumbar Exercises: Seated   Sit to Stand 20 reps    Sit to Stand Limitations No hands slow eccentrics    Other Seated Lumbar Exercises scap retraction 20 x 5 reps      Lumbar Exercises: Supine   Bridge 20 reps;5 seconds      Lumbar Exercises: Sidelying   Clam Both;20 reps      Lumbar Exercises: Prone   Straight Leg Raise 10 reps;3 seconds;Limitations  Straight Leg Raises Limitations 2 sets forehead on forearms                      PT Short Term Goals - 03/15/21 1630       PT SHORT TERM GOAL #1   Title Improve standing trunk extension AROM to 10 degrees.    Baseline 5 degrees    Time 4    Period Weeks    Status On-going    Target Date 04/11/21      PT SHORT TERM GOAL #2   Title Luis [name] will report no sciatica for at least 2 straight weeks.    Time 4    Period Weeks    Status On-going    Target Date 04/11/21               PT Long Term Goals - 03/15/21 1631       PT LONG TERM GOAL #1   Title Improve FOTO score to 70.    Baseline 60    Time 6    Period Weeks    Status On-going      PT LONG TERM GOAL #2   Title Improve hip flexibility for ER to 40 degrees.    Baseline See objective.    Time 6    Period Weeks    Status On-going      PT LONG TERM GOAL #3   Title Improve low back strength as assessed by FOTO, sellf-reported function and objective measures.    Time 6    Period Weeks    Status On-going      PT LONG TERM GOAL #4   Title Luis [name] will be independent with his long-term HEP at [facility].    Time 6    Period Weeks    Status On-going                   Plan - 03/18/21 1506     Clinical Impression Statement Pt feels back pain has  improved since starting PT last week, and is reporting pain at 4/10 today.  Will cotninue to benefit from PT to maximize function.    Personal Factors and Comorbidities Comorbidity 3+    Comorbidities Heart failure, defibrillator, s/p CABG    Examination-Activity Limitations Sleep;Bed Mobility;Bend;Lift;Squat;Locomotion Level    Examination-Participation Restrictions Church;Volunteer;Community Activity    Stability/Clinical Decision Making Stable/Uncomplicated    Rehab Potential Good    PT Frequency Other (comment)   1-2X/week for 4-6 weeks   PT Duration 6 weeks    PT Treatment/Interventions ADLs/Self Care Home Management;Cryotherapy;Traction;Therapeutic activities;Therapeutic exercise;Neuromuscular re-education;Patient/family education;Manual techniques;Dry needling    PT Next Visit Plan progress low back and hip abductors strength, practical lifting and body mechanics    PT Home Exercise Plan Access Code: [email or web address]    Consulted and Agree with Plan of Care Patient             Patient will benefit from skilled therapeutic intervention in order to improve the following deficits and impairments:  Abnormal gait, Decreased activity tolerance, Decreased endurance, Decreased range of motion, Decreased strength, Difficulty walking, Impaired flexibility, Postural dysfunction, Improper body mechanics, Pain  Visit Diagnosis: Abnormal posture  Difficulty walking  Radiculopathy, lumbar region  Chronic bilateral low back pain, unspecified whether sciatica present  Muscle weakness (generalized)  Chronic bilateral low back pain without sciatica     Problem List Patient Active Problem List   Diagnosis Date Noted   Chronic combined systolic and diastolic heart failure (  [place]) 05/20/2019   Encounter for assessment of implantable cardioverter-defibrillator (ICD) 05/20/2019   Ischemic cardiomyopathy 12/15/2017   ICD: Medtronic  single chamber Visia AF MRI VR [id number] ICD -  12/15/2017  12/15/2017    Dyspnea 04/16/2017   Acute combined systolic and diastolic heart failure ([place]) 04/16/2017   S/P CABG x 2 03/28/2017   Acute respiratory failure with hypoxia ([place]) 03/28/2017   Coronary artery disease 03/25/2017      Laureen Abrahams, PT, DPT 03/18/21 3:11 PM  Farley Ly PT, MPT    [facility] Physical Therapy [address] [place], [place], [address] Phone: [phone]   Fax:  [phone]  Name: Deshay Blumenfeld MRN: [id number] Date of Birth: 1946/09/28

## 2021-03-26 ENCOUNTER — Encounter: Payer: Medicare Other | Admitting: Rehabilitative and Restorative Service Providers"

## 2021-03-29 ENCOUNTER — Encounter: Payer: Medicare Other | Admitting: Physical Therapy

## 2021-04-02 ENCOUNTER — Encounter: Payer: Medicare Other | Admitting: Physical Therapy

## 2021-04-04 ENCOUNTER — Encounter: Payer: Medicare Other | Admitting: Physical Therapy

## 2021-04-10 ENCOUNTER — Encounter: Payer: Medicare Other | Admitting: Rehabilitative and Restorative Service Providers"

## 2021-04-12 ENCOUNTER — Encounter: Payer: Medicare Other | Admitting: Rehabilitative and Restorative Service Providers"

## 2021-04-17 ENCOUNTER — Encounter: Payer: Medicare Other | Admitting: Rehabilitative and Restorative Service Providers"

## 2021-04-19 ENCOUNTER — Encounter: Payer: Medicare Other | Admitting: Rehabilitative and Restorative Service Providers"

## 2021-04-22 ENCOUNTER — Telehealth: Payer: Self-pay

## 2021-04-22 NOTE — Telephone Encounter (Signed)
Pt came into the office requesting to see Dr. Ernestina Patches for a back injection.  Luis [name] pt

## 2021-05-01 ENCOUNTER — Ambulatory Visit: Payer: Medicare Other | Admitting: Physical Medicine and Rehabilitation

## 2021-05-01 ENCOUNTER — Encounter: Payer: Self-pay | Admitting: Physical Medicine and Rehabilitation

## 2021-05-01 ENCOUNTER — Encounter: Payer: Medicare Other | Admitting: Rehabilitative and Restorative Service Providers"

## 2021-05-01 ENCOUNTER — Telehealth: Payer: Self-pay | Admitting: Physical Medicine and Rehabilitation

## 2021-05-01 ENCOUNTER — Other Ambulatory Visit: Payer: Self-pay

## 2021-05-01 VITALS — BP 102/71 | HR 74

## 2021-05-01 DIAGNOSIS — M47819 Spondylosis without myelopathy or radiculopathy, site unspecified: Secondary | ICD-10-CM | POA: Diagnosis not present

## 2021-05-01 DIAGNOSIS — M5416 Radiculopathy, lumbar region: Secondary | ICD-10-CM | POA: Diagnosis not present

## 2021-05-01 DIAGNOSIS — R208 Other disturbances of skin sensation: Secondary | ICD-10-CM | POA: Diagnosis not present

## 2021-05-01 DIAGNOSIS — M4726 Other spondylosis with radiculopathy, lumbar region: Secondary | ICD-10-CM | POA: Diagnosis not present

## 2021-05-01 NOTE — Progress Notes (Signed)
Low back pain. Pain radiates into posterior right thigh. Pain is worse with bending. Has had injections "a long time ago" for similar pain. Numeric Pain Rating Scale and Functional Assessment Average Pain 7 Pain Right Now 5 My pain is constant, dull, and aching Pain is worse with: bending Pain improves with:  stretching   In the last MONTH (on 0-10 scale) has pain interfered with the following?  1. General activity like being  able to carry out your everyday physical activities such as walking, climbing stairs, carrying groceries, or moving a chair?  Rating(5)  2. Relation with others like being able to carry out your usual social activities and roles such as  activities at home, at work and in your community. Rating(5)  3. Enjoyment of life such that you have  been bothered by emotional problems such as feeling anxious, depressed or irritable?  Rating(0)

## 2021-05-01 NOTE — Progress Notes (Signed)
Luis [name] - 75 y.o. male MRN [id number]  Date of birth: 1946/05/31  Office Visit Note: Visit Date: 05/01/2021 PCP: [name], MD Referred by: [name], MD  Subjective: Chief Complaint  Patient presents with   Lower Back - Pain   HPI: Luis [name] is a 75 y.o. male who comes in today for evaluation of chronic, worsening and severe bilateral lower back pain radiating to right buttock, posterior leg and down to posterior calf.  Patient reports pain has worsened over the last several weeks and describes as soreness sensation, rating 5 out of 10 at present.  Patient reports some relief of pain with stretching exercises at home and Tylenol.  Patient's lumbar MRI from 2017 exhibits disc protrusions at L3-4 L4-5 and L5-S1.  Multi-level facet hypertrophy also noted. No high grade canal stenosis.  Patient attended 2 sessions of physical therapy here in house recently and reports that this did not help his pain. Patient had Medtronic ICD placed in 2019 and is currently being managed by Dr. [name] at [facility]. Patient reports he is traveling for work and is getting ready to go to [place] next week. Patient requesting epidural steroid injection before his trip. Patient denies focal weakness, numbness and tingling. Patient denies recent trauma or falls.   Review of Systems  Musculoskeletal:  Positive for back pain.  Neurological:  Negative for tremors and focal weakness.  All other systems reviewed and are negative. Otherwise per HPI.  Assessment & Plan: Visit Diagnoses:    ICD-10-CM   1. Lumbar radiculopathy  M54.16     2. Other spondylosis with radiculopathy, lumbar region  M47.26     3. Dysesthesia  R20.8     4. Facet hypertrophy  M47.819        Plan: Findings:  Chronic, worsening and severe bilateral lower back pain radiating to right buttock, posterior leg down to posterior calf.  Patient's clinical presentation is consistent with classic L5 distribution.  There are  dysesthesias noted to L5 dermatome.  Patient continues to have severe lower back pain which is making it more difficult for him to travel for work.  We believe the neck step would be to perform a diagnostic and hopefully therapeutic right L5-S1 interlaminar epidural steroid injection.  If patient does not get significant pain relief with this injection we will then consider a lumbar MRI.  Patient's Medtronic ICD is MRI compatible according to his information card and patient provided literature. Patient is encouraged to continue home exercise regimen and strengthening exercises.  Patient also instructed to take 500 mg of Tylenol by mouth 4 times a day for pain.  We are confident that we can get the patient scheduled quickly for his injection before he leaves for his trip.  No red flag symptoms noted upon exam.   Meds & Orders: No orders of the defined types were placed in this encounter.  No orders of the defined types were placed in this encounter.   Follow-up: Return in about 1 week (around 05/08/2021) for Right L5-S1 interlaminar epidural steroid injection.   Procedures: No procedures performed      Clinical History: MRI LUMBAR SPINE WITHOUT CONTRAST  TECHNIQUE: Multiplanar, multisequence MR imaging of the lumbar spine was performed. No intravenous contrast was administered. COMPARISON:  CT scan 02/23/2015 FINDINGS: Segmentation: 5 lumbar type vertebral bodies. The last full intervertebral disc space is labeled L5-S1. Alignment:  Normal. Vertebrae: Normal marrow signal except for mild endplate reactive changes and a few scattered hemangiomas.  Conus medullaris: Extends to the bottom of L1 level and appears normal. Paraspinal and other soft tissues: Unremarkable. Disc levels: L1-2:  No significant findings. L2-3: Minimal annular bulge with mild bilateral lateral recess encroachment. No spinal or foraminal stenosis. L3-4: Shallow disc protrusion with flattening of the ventral  thecal sac and mild bilateral lateral recess stenosis. No foraminal stenosis. Mild facet disease. L4-5: Focal central disc protrusion with mass effect on the ventral thecal sac and mild bilateral lateral recess stenosis. No foraminal stenosis. L5-S1: Focal central disc protrusion with mass effect on the ventral thecal sac and mild bilateral lateral recess and foraminal stenosis. Moderate facet disease. IMPRESSION: Disc protrusions at L3-4, L4-5 and L5-S1 as described above. Electronically Signed   By: Marijo Sanes M.D.   On: 04/23/2016 15:32   He reports that he has never smoked. He has never used smokeless tobacco. No results for input(s): HGBA1C, LABURIC in the last 8760 hours.  Objective:  VS:  HT:    WT:   BMI:     BP:102/71  HR:74bpm  TEMP: ( )  RESP:  Physical Exam HENT:     Head: Normocephalic and atraumatic.     Right Ear: Tympanic membrane normal.     Left Ear: Tympanic membrane normal.     Nose: Nose normal.     Mouth/Throat:     Mouth: Mucous membranes are moist.  Eyes:     Pupils: Pupils are equal, round, and reactive to light.  Cardiovascular:     Rate and Rhythm: Normal rate.     Pulses: Normal pulses.  Pulmonary:     Effort: Pulmonary effort is normal.  Abdominal:     General: Abdomen is flat. There is no distension.  Musculoskeletal:     Comments: Pt rises from seated position to standing without difficulty. Good lumbar range of motion. Strong distal strength without clonus, no pain upon palpation of greater trochanters. Sensation intact bilaterally. Walks independently, gait steady. Positive slump test. Dysesthesias noted to L5 dermatome upon exam.      Skin:    General: Skin is warm and dry.     Capillary Refill: Capillary refill takes less than 2 seconds.  Neurological:     General: No focal deficit present.     Mental Status: He is alert.  Psychiatric:        Mood and Affect: Mood normal.    Ortho Exam  Imaging: No results found.  Past  Medical/Family/Surgical/Social History: Medications & Allergies reviewed per EMR, new medications updated. Patient Active Problem List   Diagnosis Date Noted   Chronic combined systolic and diastolic heart failure ([place]) 05/20/2019   Encounter for assessment of implantable cardioverter-defibrillator (ICD) 05/20/2019   Ischemic cardiomyopathy 12/15/2017   ICD: Medtronic  single chamber Visia AF MRI VR [id number] ICD -  12/15/2017  12/15/2017   Dyspnea 04/16/2017   Acute combined systolic and diastolic heart failure ([place]) 04/16/2017   S/P CABG x 2 03/28/2017   Acute respiratory failure with hypoxia ([place]) 03/28/2017   Coronary artery disease 03/25/2017   Past Medical History:  Diagnosis Date   AICD (automatic cardioverter/defibrillator) present 12/15/2017   AKI (acute kidney injury) ([employer]) 03/28/2017   Cardiogenic shock ([place]) 03/28/2017   Chronic combined systolic and diastolic heart failure ([place]) 05/20/2019   Chronic kidney disease    kidney stones; noted per H&P per Dr [name] 02/06/2015 chronic kidney disease stage II   Dyslipidemia    Encounter for assessment of implantable cardioverter-defibrillator (ICD) 05/20/2019  History of hepatomegaly    with fatty liver discussed per H&P per Dr [name] 02/06/2015 secondary to abd ultrasound    History of kidney stones    LVAD (left ventricular assist device) present [facility])    Pneumothorax on right    Sleep apnea    AHI 17 on sleep study 08/23/2013 pt states was given CPAP machine but sent it back - does not use now 12/15/2017   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   Vitamin D deficiency    as noted per H&P per Dr [name] 02/06/2015   Family History  Problem Relation Age of Onset   Healthy Mother    Healthy Father    Healthy Sister    Healthy Brother    Past Surgical History:  Procedure Laterality Date   ASD REPAIR N/A 03/25/2017   Procedure: ATRIAL SEPTAL DEFECT (ASD) REPAIR;  Surgeon: [name], MD;  Location: [place];   Service: Open Heart Surgery;  Laterality: N/A;   BIOPSY  03/10/2019   Procedure: BIOPSY;  Surgeon: Juanita Craver, MD;  [facility];  Service: Endoscopy;;   CARDIAC DEFIBRILLATOR PLACEMENT  12/15/2017   COLONOSCOPY WITH PROPOFOL N/A 03/10/2019   Procedure: COLONOSCOPY WITH PROPOFOL;  Surgeon: Juanita Craver, MD;  [facility];  Service: Endoscopy;  Laterality: N/A;   CORONARY ARTERY BYPASS GRAFT N/A 03/25/2017   Procedure: CORONARY ARTERY BYPASS GRAFTING (CABG) x 2 using left internal mammary artery and right greater saphenous leg vein using endosciope.;  Surgeon: [name], MD;  Location: [place];  Service: Open Heart Surgery;  Laterality: N/A;   CYSTOSCOPY W/ RETROGRADES Left 03/26/2015   Procedure: CYSTOSCOPY WITH RETROGRADE PYELOGRAM;  Surgeon: [name], MD;  [facility];  Service: Urology;  Laterality: Left;   ICD IMPLANT N/A 12/15/2017   Procedure: ICD IMPLANT;  Surgeon: [name], MD;  Location: [place] [facility];  Service: Cardiovascular;  Laterality: N/A;   LEFT HEART CATH AND CORONARY ANGIOGRAPHY N/A 03/25/2017   Procedure: Left Heart Cath and Coronary Angiography;  Surgeon: [name], MD;  Location: [place] [facility];  Service: Cardiovascular;  Laterality: N/A;   PLACEMENT OF IMPELLA LEFT VENTRICULAR ASSIST DEVICE  03/25/2017   Procedure: PLACEMENT OF IMPELLA LEFT VENTRICULAR ASSIST DEVICE;  Surgeon: [name], MD;  Location: [employer];  Service: Open Heart Surgery;;   REMOVAL OF IMPELLA LEFT VENTRICULAR ASSIST DEVICE N/A 03/31/2017   Procedure: REMOVAL OF IMPELLA LEFT VENTRICULAR ASSIST DEVICE;  Surgeon: [name], MD;  Location: [place];  Service: Open Heart Surgery;  Laterality: N/A;   ROBOT ASSISTED PYELOPLASTY Left 03/26/2015   Procedure: ROBOTIC ASSISTED PYELOPLASTY WITH STENT PLACEMENT, REMOVAL OF STONE AND CYST DECORTICATION;  Surgeon: [name], MD;  [facility];  Service: Urology;  Laterality: Left;   TEE WITHOUT  CARDIOVERSION N/A 03/25/2017   Procedure: TRANSESOPHAGEAL ECHOCARDIOGRAM (TEE);  Surgeon: [name], [name], MD;  Location: [place];  Service: Open Heart Surgery;  Laterality: N/A;   TEE WITHOUT CARDIOVERSION N/A 03/31/2017   Procedure: TRANSESOPHAGEAL ECHOCARDIOGRAM (TEE);  Surgeon: [name], [name], MD;  Location: [place];  Service: Open Heart Surgery;  Laterality: N/A;   Social History   Occupational History   Not on file  Tobacco Use   Smoking status: Never   Smokeless tobacco: Never  Vaping Use   Vaping Use: Never used  Substance and Sexual Activity   Alcohol use: No   Drug use: No   Sexual activity: Not on file

## 2021-05-01 NOTE — Telephone Encounter (Signed)
Is auth needed for right L5-S1 IL? Scheduled for tomorrow, 8/4.

## 2021-05-02 ENCOUNTER — Ambulatory Visit: Payer: Self-pay

## 2021-05-02 ENCOUNTER — Encounter: Payer: Self-pay | Admitting: Physical Medicine and Rehabilitation

## 2021-05-02 ENCOUNTER — Ambulatory Visit (INDEPENDENT_AMBULATORY_CARE_PROVIDER_SITE_OTHER): Payer: Medicare Other | Admitting: Physical Medicine and Rehabilitation

## 2021-05-02 VITALS — BP 100/63 | HR 74

## 2021-05-02 DIAGNOSIS — M5416 Radiculopathy, lumbar region: Secondary | ICD-10-CM

## 2021-05-02 MED ORDER — BETAMETHASONE SOD PHOS & ACET 6 (3-3) MG/ML IJ SUSP
12.0000 mg | Freq: Once | INTRAMUSCULAR | Status: AC
  Administered 2021-05-02: 12 mg

## 2021-05-02 NOTE — Patient Instructions (Signed)

## 2021-05-02 NOTE — Progress Notes (Signed)
Pt state lower back pain that travels down his right leg. Pt state bending over makes the pain worse. Pt state he takes over the counter pain meds to help ease his pain.  Numeric Pain Rating Scale and Functional Assessment Average Pain 6   In the last MONTH (on 0-10 scale) has pain interfered with the following?  1. General activity like being  able to carry out your everyday physical activities such as walking, climbing stairs, carrying groceries, or moving a chair?  Rating(7)   +Driver, -BT, -Dye Allergies.

## 2021-05-03 ENCOUNTER — Encounter: Payer: Medicare Other | Admitting: Rehabilitative and Restorative Service Providers"

## 2021-05-21 NOTE — Progress Notes (Signed)
Luis [name] - 75 y.o. male MRN [id number]  Date of birth: 12/07/1945  Office Visit Note: Visit Date: 05/02/2021 PCP: [name], MD Referred by: [name], MD  Subjective: Chief Complaint  Patient presents with   Lower Back - Pain   Right Leg - Pain   HPI:  Luis [name] is a 75 y.o. male who comes in today for planned Right L5-S1 Lumbar Interlaminar epidural steroid injection with fluoroscopic guidance.  The patient has failed conservative care including home exercise, medications, time and activity modification.  This injection will be diagnostic and hopefully therapeutic.  Please see requesting physician notes for further details and justification.   ROS Otherwise per HPI.  Assessment & Plan: Visit Diagnoses:    ICD-10-CM   1. Lumbar radiculopathy  M54.16 XR C-ARM NO REPORT    Epidural Steroid injection    betamethasone acetate-betamethasone sodium phosphate (CELESTONE) injection 12 mg      Plan: No additional findings.   Meds & Orders:  Meds ordered this encounter  Medications   betamethasone acetate-betamethasone sodium phosphate (CELESTONE) injection 12 mg    Orders Placed This Encounter  Procedures   XR C-ARM NO REPORT   Epidural Steroid injection    Follow-up: No follow-ups on file.   Procedures: No procedures performed  Lumbar Epidural Steroid Injection - Interlaminar Approach with Fluoroscopic Guidance  Patient: Luis [name]      Date of Birth: 1946-04-06 MRN: [id number] PCP: [name], MD      Visit Date: 05/02/2021   Universal Protocol:     Consent Given By: the patient  Position: PRONE  Additional Comments: Vital signs were monitored before and after the procedure. Patient was prepped and draped in the usual sterile fashion. The correct patient, procedure, and site was verified.   Injection Procedure Details:   Procedure diagnoses: Lumbar radiculopathy [M54.16]   Meds Administered:  Meds ordered this encounter  Medications    betamethasone acetate-betamethasone sodium phosphate (CELESTONE) injection 12 mg     Laterality: Right  Location/Site:  L5-S1  Needle: 3.5 in., 20 ga. Tuohy  Needle Placement: Paramedian epidural  Findings:   -Comments: Excellent flow of contrast into the epidural space.  Procedure Details: Using a paramedian approach from the side mentioned above, the region overlying the inferior lamina was localized under fluoroscopic visualization and the soft tissues overlying this structure were infiltrated with 4 ml. of 1% Lidocaine without Epinephrine. The Tuohy needle was inserted into the epidural space using a paramedian approach.   The epidural space was localized using loss of resistance along with counter oblique bi-planar fluoroscopic views.  After negative aspirate for air, blood, and CSF, a 2 ml. volume of Isovue-250 was injected into the epidural space and the flow of contrast was observed. Radiographs were obtained for documentation purposes.    The injectate was administered into the level noted above.   Additional Comments:  The patient tolerated the procedure well Dressing: 2 x 2 sterile gauze and Band-Aid    Post-procedure details: Patient was observed during the procedure. Post-procedure instructions were reviewed.  Patient left the clinic in stable condition.   Clinical History: MRI LUMBAR SPINE WITHOUT CONTRAST  TECHNIQUE: Multiplanar, multisequence MR imaging of the lumbar spine was performed. No intravenous contrast was administered. COMPARISON:  CT scan 02/23/2015 FINDINGS: Segmentation: 5 lumbar type vertebral bodies. The last full intervertebral disc space is labeled L5-S1. Alignment:  Normal. Vertebrae: Normal marrow signal except for mild endplate reactive changes and a few scattered hemangiomas. Conus medullaris:  Extends to the bottom of L1 level and appears normal. Paraspinal and other soft tissues: Unremarkable. Disc levels: L1-2:  No significant  findings. L2-3: Minimal annular bulge with mild bilateral lateral recess encroachment. No spinal or foraminal stenosis. L3-4: Shallow disc protrusion with flattening of the ventral thecal sac and mild bilateral lateral recess stenosis. No foraminal stenosis. Mild facet disease. L4-5: Focal central disc protrusion with mass effect on the ventral thecal sac and mild bilateral lateral recess stenosis. No foraminal stenosis. L5-S1: Focal central disc protrusion with mass effect on the ventral thecal sac and mild bilateral lateral recess and foraminal stenosis. Moderate facet disease. IMPRESSION: Disc protrusions at L3-4, L4-5 and L5-S1 as described above. Electronically Signed   By: Marijo Sanes M.D.   On: 04/23/2016 15:32     Objective:  VS:  HT:    WT:   BMI:     BP:100/63  HR:74bpm  TEMP: ( )  RESP:  Physical Exam Vitals and nursing note reviewed.  Constitutional:      General: He is not in acute distress.    Appearance: Normal appearance. He is not ill-appearing.  HENT:     Head: Normocephalic and atraumatic.     Right Ear: External ear normal.     Left Ear: External ear normal.     Nose: No congestion.  Eyes:     Extraocular Movements: Extraocular movements intact.  Cardiovascular:     Rate and Rhythm: Normal rate.     Pulses: Normal pulses.  Pulmonary:     Effort: Pulmonary effort is normal. No respiratory distress.  Abdominal:     General: There is no distension.     Palpations: Abdomen is soft.  Musculoskeletal:        General: No tenderness or signs of injury.     Cervical back: Neck supple.     Right lower leg: No edema.     Left lower leg: No edema.     Comments: Patient has good distal strength without clonus.  Skin:    Findings: No erythema or rash.  Neurological:     General: No focal deficit present.     Mental Status: He is alert and oriented to person, place, and time.     Sensory: No sensory deficit.     Motor: No weakness or abnormal muscle  tone.     Coordination: Coordination normal.  Psychiatric:        Mood and Affect: Mood normal.        Behavior: Behavior normal.     Imaging: No results found.

## 2021-05-21 NOTE — Procedures (Signed)
Lumbar Epidural Steroid Injection - Interlaminar Approach with Fluoroscopic Guidance  Patient: Luis [name]      Date of Birth: 1946/06/18 MRN: [id number] PCP: [name], MD      Visit Date: 05/02/2021   Universal Protocol:     Consent Given By: the patient  Position: PRONE  Additional Comments: Vital signs were monitored before and after the procedure. Patient was prepped and draped in the usual sterile fashion. The correct patient, procedure, and site was verified.   Injection Procedure Details:   Procedure diagnoses: Lumbar radiculopathy [M54.16]   Meds Administered:  Meds ordered this encounter  Medications   betamethasone acetate-betamethasone sodium phosphate (CELESTONE) injection 12 mg     Laterality: Right  Location/Site:  L5-S1  Needle: 3.5 in., 20 ga. Tuohy  Needle Placement: Paramedian epidural  Findings:   -Comments: Excellent flow of contrast into the epidural space.  Procedure Details: Using a paramedian approach from the side mentioned above, the region overlying the inferior lamina was localized under fluoroscopic visualization and the soft tissues overlying this structure were infiltrated with 4 ml. of 1% Lidocaine without Epinephrine. The Tuohy needle was inserted into the epidural space using a paramedian approach.   The epidural space was localized using loss of resistance along with counter oblique bi-planar fluoroscopic views.  After negative aspirate for air, blood, and CSF, a 2 ml. volume of Isovue-250 was injected into the epidural space and the flow of contrast was observed. Radiographs were obtained for documentation purposes.    The injectate was administered into the level noted above.   Additional Comments:  The patient tolerated the procedure well Dressing: 2 x 2 sterile gauze and Band-Aid    Post-procedure details: Patient was observed during the procedure. Post-procedure instructions were reviewed.  Patient left the clinic  in stable condition.

## 2021-06-19 ENCOUNTER — Other Ambulatory Visit: Payer: Self-pay | Admitting: Cardiology

## 2021-06-19 DIAGNOSIS — I2510 Atherosclerotic heart disease of native coronary artery without angina pectoris: Secondary | ICD-10-CM

## 2021-06-19 DIAGNOSIS — E7800 Pure hypercholesterolemia, unspecified: Secondary | ICD-10-CM

## 2021-08-16 ENCOUNTER — Ambulatory Visit: Payer: Medicare Other | Admitting: Cardiology

## 2021-10-28 ENCOUNTER — Other Ambulatory Visit: Payer: Self-pay | Admitting: Cardiology

## 2021-10-28 DIAGNOSIS — E782 Mixed hyperlipidemia: Secondary | ICD-10-CM

## 2021-10-30 ENCOUNTER — Ambulatory Visit: Payer: Medicare Other | Admitting: Cardiology

## 2021-12-23 ENCOUNTER — Telehealth: Payer: Self-pay

## 2021-12-25 ENCOUNTER — Encounter: Payer: Self-pay | Admitting: Cardiology

## 2021-12-30 ENCOUNTER — Ambulatory Visit: Payer: Medicare Other | Admitting: Cardiology

## 2021-12-30 ENCOUNTER — Encounter: Payer: Self-pay | Admitting: Cardiology

## 2021-12-30 VITALS — BP 98/63 | HR 78 | Temp 98.3°F | Resp 16 | Ht 68.0 in | Wt 176.0 lb

## 2021-12-30 DIAGNOSIS — E7800 Pure hypercholesterolemia, unspecified: Secondary | ICD-10-CM

## 2021-12-30 DIAGNOSIS — I2510 Atherosclerotic heart disease of native coronary artery without angina pectoris: Secondary | ICD-10-CM

## 2021-12-30 DIAGNOSIS — E782 Mixed hyperlipidemia: Secondary | ICD-10-CM

## 2021-12-30 DIAGNOSIS — Z95810 Presence of automatic (implantable) cardiac defibrillator: Secondary | ICD-10-CM

## 2021-12-30 DIAGNOSIS — I5022 Chronic systolic (congestive) heart failure: Secondary | ICD-10-CM

## 2021-12-30 MED ORDER — CARVEDILOL 6.25 MG PO TABS: 6.2500 mg | ORAL_TABLET | Freq: Two times a day (BID) | ORAL | 3 refills | Status: DC

## 2021-12-30 MED ORDER — ENTRESTO 24-26 MG PO TABS: 1.0000 | ORAL_TABLET | Freq: Two times a day (BID) | ORAL | 3 refills | Status: DC

## 2021-12-30 MED ORDER — ROSUVASTATIN CALCIUM 10 MG PO TABS: 10.0000 mg | ORAL_TABLET | Freq: Every day | ORAL | 3 refills | Status: DC

## 2021-12-30 MED ORDER — EZETIMIBE 10 MG PO TABS: 10.0000 mg | ORAL_TABLET | Freq: Every day | ORAL | 3 refills | Status: DC

## 2021-12-30 MED ORDER — REPATHA SURECLICK 140 MG/ML ~~LOC~~ SOAJ: 140.0000 mg | SUBCUTANEOUS | 3 refills | Status: DC

## 2021-12-30 MED ORDER — FENOFIBRATE 48 MG PO TABS: 48.0000 mg | ORAL_TABLET | Freq: Every day | ORAL | 3 refills | Status: DC

## 2021-12-30 MED ORDER — REPATHA PUSHTRONEX SYSTEM 420 MG/3.5ML ~~LOC~~ SOCT: 1.0000 | SUBCUTANEOUS | 3 refills | Status: DC

## 2021-12-30 NOTE — Progress Notes (Signed)
? ?Primary Physician/Referring:  Moreira, Roy, MD ? ?Patient ID: Luis [name], male    DOB: 03/10/1946, 76 y.o.   MRN: [id number] ? ?Chief Complaint  ?Patient presents with  ? Coronary Artery Disease  ? Hyperlipidemia  ? Congestive Heart Failure  ? Follow-up  ?  6 months  ? Dizziness  ? ? ?HPI: Luis [name]  is a 76 y.o. male  with coronary artery disease with history of emergent CABG with LIMA to LAD and SVG to OM by Peter Van Tright.  His past medical history includes hyperlipidemia.  Underwent single chamber Medtronic ICD implantation on 12/15/2017 for primary prevention in view of persistent low LVEF.  ? ?He presents for his routine 6 month visit. He has just returned from a trip to [place] and [place], he did not have any dyspnea, chest pain leg edema.  He did complain of chest pain that occurred for 2 days after he returned and landed in [place] but states that today he has not had any chest pain.  It did not remind him of his angina pectoris.  Stated that he also had some GERD which is now relieved with supplements. ? ?Past Medical History:  ?Diagnosis Date  ? Acute combined systolic and diastolic heart failure (HCC) 04/16/2017  ? Acute respiratory failure with hypoxia (HCC) 03/28/2017  ? AICD (automatic cardioverter/defibrillator) present 12/15/2017  ? Cardiogenic shock (HCC) 03/28/2017  ? 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 Chronic kidney disease   ? kidney stones;  ? Dyslipidemia   ? Encounter for assessment of implantable cardioverter-defibrillator (ICD) 05/20/2019  ? History of hepatomegaly   ? History of kidney stones   ? Sleep apnea   ? AHI 17 on sleep study 08/23/2013 pt states was given CPAP machine but sent it back - does not use now 12/15/2017  ? 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  ? Vitamin D deficiency   ? as noted per H&P per Dr Moreira 02/06/2015  ? ? ?Social History  ? ?Tobacco Use  ? Smoking status: Never  ? Smokeless tobacco: Never  ?Substance  Use Topics  ? Alcohol use: No  ? Marital Status: Married  ? ?Review of Systems  ?Cardiovascular:  Positive for chest pain. Negative for dyspnea on exertion and leg swelling.  ?Gastrointestinal:  Negative for melena.  ?Objective  ?Blood pressure 98/63, pulse 78, temperature 98.3 ?F (36.8 ?C), temperature source Temporal, resp. rate 16, height 5' 8" (1.727 m), weight 176 lb (79.8 kg), SpO2 96 %. Body mass index is 26.76 kg/m?.  ? ? ?  12/30/2021  ?  8:41 AM 05/02/2021  ?  2:08 PM 05/01/2021  ?  9:02 AM  ?Vitals with BMI  ?Height 5' 8"    ?Weight 176 lbs    ?BMI 26.77    ?Systolic 98 100 102  ?Diastolic 63 63 71  ?Pulse 78 74 74  ?  ?   ?Physical Exam ?Constitutional:   ?   General: He is not in acute distress. ?   Appearance: Normal appearance. He is well-developed.  ?Cardiovascular:  ?   Rate and Rhythm: Normal rate and regular rhythm.  ?   Pulses: Normal pulses and intact distal pulses.  ?   Heart sounds: Normal heart sounds. No murmur heard. ?  No gallop.  ?   Comments: No JVD. ?No pedal edema. ?Pulmonary:  ?   Effort: Pulmonary effort is normal.  ?   Breath sounds: Normal breath sounds.  ?Abdominal:  ?     General: Bowel sounds are normal.  ?   Palpations: Abdomen is soft.  ?Musculoskeletal:     ?   General: Normal range of motion.  ?Skin: ?   General: Skin is warm and dry.  ?   Capillary Refill: Capillary refill takes less than 2 seconds.  ?Neurological:  ?   General: No focal deficit present.  ?   Mental Status: He is oriented to person, place, and time.  ? ? ?Laboratory examination:  ? ? ?  Latest Ref Rng & Units 04/25/2019  ?  6:06 PM 12/10/2017  ? 10:56 AM 04/17/2017  ?  4:03 AM  ?CMP  ?Glucose 70 - 99 mg/dL 108   104   93    ?BUN 8 - 23 mg/dL 20   18   12    ?Creatinine 0.61 - 1.24 mg/dL 1.18   1.00   1.19    ?Sodium 135 - 145 mmol/L 132   140   134    ?Potassium 3.5 - 5.1 mmol/L 5.1   5.1   4.3    ?Chloride 98 - 111 mmol/L 104   104   101    ?CO2 22 - 32 mmol/L 17   23   24    ?Calcium 8.9 - 10.3 mg/dL 8.4   9.4   8.5     ? ? ?  Latest Ref Rng & Units 04/25/2019  ?  6:06 PM 12/10/2017  ? 10:53 AM 04/16/2017  ? 12:30 AM  ?CBC  ?WBC 4.0 - 10.5 K/uL 12.0   6.5   7.7    ?Hemoglobin 13.0 - 17.0 g/dL 14.8   13.8   9.9    ?Hematocrit 39.0 - 52.0 % 43.9   40.5   30.8    ?Platelets 150 - 400 K/uL 180   243   414    ? ?Lipid Panel  ?   ?Component Value Date/Time  ? CHOL 97 (L) 02/14/2021 0808  ? TRIG 115 02/14/2021 0808  ? HDL 41 02/14/2021 0808  ? LDLCALC 35 02/14/2021 0808  ? LDLDIRECT 35 02/14/2021 0808  ? ? ?External Labs:  ? ?Labs 12/14/2020: ? ?Total cholesterol 163, triglycerides 138, HDL 36, LDL 99. ? ?Hb 14.9/HCT 44.3, platelets 285, normal indicis. ? ?Potassium 5.3, BUN 11, creatinine 1.05, EGFR 70 mL, serum glucose 103 mg.  CMP otherwise normal. ? ?Labs 05/03/2019: Potassium 5.2, BUN 14, creatinine 0.9, eGFR> 60 mL, CMP otherwise normal.  A1c 6.1%. ? ?Labs 03/17/2019: Total cholesterol 114, triglycerides 180, HDL 41, LDL 37.  Non-HDL cholesterol 73.  ?Medications  ? ?Allergies  ?Allergen Reactions  ? Contrast Media [Iodinated Contrast Media] Itching  ?  Pt states he gets itchy when he has contrast.   ? Crestor [Rosuvastatin Calcium] Other (See Comments)  ?  myalgias  ?  ?Current Outpatient Medications:  ?  aspirin EC 81 MG tablet, Take 81 mg by mouth daily., Disp: , Rfl:  ?  cholecalciferol (VITAMIN D) 1000 units tablet, Take 1,000 Units by mouth daily., Disp: , Rfl:  ?  Coenzyme Q10 (COQ10) 200 MG CAPS, Take 200 mg by mouth daily., Disp: , Rfl:  ?  nitroGLYCERIN (NITROSTAT) 0.4 MG SL tablet, Place 1 tablet (0.4 mg total) under the tongue every 5 (five) minutes as needed for chest pain., Disp: 25 tablet, Rfl: 4 ?  vitamin B-12 (CYANOCOBALAMIN) 1000 MCG tablet, Take 1,000 mcg by mouth daily., Disp: , Rfl:  ?  carvedilol (COREG) 6.25 MG tablet, Take 1 tablet (  6.25 mg total) by mouth 2 (two) times daily., Disp: 200 tablet, Rfl: 3 ?  Evolocumab (REPATHA SURECLICK) 176 MG/ML SOAJ, Inject 140 mg into the skin every 14 (fourteen) days.,  Disp: 7 mL, Rfl: 3 ?  Evolocumab with Infusor ([place]) 420 MG/3.5ML SOCT, Inject 1 Cartridge into the skin every 30 (thirty) days., Disp: 11.5 mL, Rfl: 3 ?  ezetimibe (ZETIA) 10 MG tablet, Take 1 tablet (10 mg total) by mouth daily., Disp: 100 tablet, Rfl: 3 ?  fenofibrate (TRICOR) 48 MG tablet, Take 1 tablet (48 mg total) by mouth daily with lunch., Disp: 100 tablet, Rfl: 3 ?  rosuvastatin (CRESTOR) 10 MG tablet, Take 1 tablet (10 mg total) by mouth daily., Disp: 100 tablet, Rfl: 3 ?  sacubitril-valsartan (ENTRESTO) 24-26 MG, Take 1 tablet by mouth 2 (two) times daily., Disp: 200 tablet, Rfl: 3  ?  ?Radiology:  ? ?No results found. ? ?Cardiac Studies:  ? ?Coronary angiogram 03/25/2017: Mild decrease in LV systolic function, EF 16% with inferior, inferoapical severe hypokinesis. LAD CTO midsegment just after origin of a large D1, short segment occlusion with type II collaterals from the RCA. Bridging Collaterals also evident. Mid circumflex very large with a 75% stenosis. Large OM1. Mild disease. RCA mild diffuse disease, large vessel. Type II collaterals to the LAD. Distal small PL secondary branch is occluded and has collaterals from left to right.  ?  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 ? ?Exercise tetrofosmin stress test  08/08/2019: ?Normal ECG stress. Patinet exercised for a total of 5 minutes and 59 seconds, achieving approximately 7.05 METs. Baseline heart rate was measured at 74 bpm. A maximum heart rate of 132 beats per minute was achieved, which is 89% of maximum predicted heart rate. Normal BP resonse.  ?Mild degree medium extent mildly abnormal perfusion consistent with mild (reversible) ischemia located in the mid anteroseptal wall and basal anterior wall (Left Anterior Descending Artery region) of left ventricle. ?Akinesis of the apical anterior wall, apical cap, apical septal wall, mid anteroseptal wall, mid anterior wall,  basal anteroseptal wall and basal anterior wall of the left ventricle.  ?Stress LV EF: 26%.  ?High risk sutyd due to severe decrease in LV systolic function and minimal ischemia.  ?No previous exam availa

## 2021-12-31 NOTE — Telephone Encounter (Signed)
Rodessa  ? ?PA for Repatha approved: ? ?12/23/2021 - 12/24/2022 ?

## 2022-01-02 ENCOUNTER — Ambulatory Visit: Payer: Medicare Other | Admitting: Cardiology

## 2022-01-02 DIAGNOSIS — I255 Ischemic cardiomyopathy: Secondary | ICD-10-CM

## 2022-01-02 DIAGNOSIS — Z95810 Presence of automatic (implantable) cardiac defibrillator: Secondary | ICD-10-CM

## 2022-01-02 DIAGNOSIS — Z4502 Encounter for adjustment and management of automatic implantable cardiac defibrillator: Secondary | ICD-10-CM

## 2022-01-02 NOTE — Progress Notes (Signed)
Chief Complaint  ?Patient presents with  ? ICD Check  ? ? ?  ICD-10-CM   ?1. Encounter for assessment of implantable cardioverter-defibrillator (ICD)  Z45.02   ?  ?2. ICD: Medtronic  single chamber Visia AF MRI VR [id number] ICD -  12/15/2017   Z95.810   ?  ?3. Ischemic cardiomyopathy  I25.5   ?  ? ?Remote single-chamber ICD transmission 12/24/2021: ?VP <0.1%.  Longevity 7 years and 1 month.  Lead impedance and thresholds within normal limits.  There were no significant scratch that there were no high ventricular rate episodes.  Heart failure monitor does not suggest volume overload state. ? ?Scheduled  In office ICD 01/02/22  ?Single (S)/Dual (D)/BV (M) S ?Presenting VS ?Pacer dependant: No. Underlying VS. AP NA%, VP <1% ?AMS Episodes NA.  AT/AF burden   ?HVR 0.  ?Longevity 6.9 Years/Voltage.  ?Lead measurements: Stable ?Histogram: Low (L)/normal (N)/high (H)  Good Patient activity Good. ?Thoracic impedance: Baseline and no fluid overload ? ?Observations: Normal ICD function.  Changes: None  ?

## 2022-02-01 ENCOUNTER — Encounter: Payer: Self-pay | Admitting: Cardiology

## 2022-02-10 ENCOUNTER — Ambulatory Visit: Payer: Medicare Other | Admitting: Cardiology

## 2022-02-10 ENCOUNTER — Encounter: Payer: Self-pay | Admitting: Cardiology

## 2022-02-10 VITALS — BP 113/82 | HR 85 | Temp 97.8°F | Resp 17 | Ht 68.0 in | Wt 181.4 lb

## 2022-02-10 DIAGNOSIS — I2510 Atherosclerotic heart disease of native coronary artery without angina pectoris: Secondary | ICD-10-CM

## 2022-02-10 DIAGNOSIS — I5022 Chronic systolic (congestive) heart failure: Secondary | ICD-10-CM

## 2022-02-10 DIAGNOSIS — E782 Mixed hyperlipidemia: Secondary | ICD-10-CM

## 2022-02-10 DIAGNOSIS — R5383 Other fatigue: Secondary | ICD-10-CM

## 2022-02-10 MED ORDER — LOSARTAN POTASSIUM 25 MG PO TABS: 25.0000 mg | ORAL_TABLET | Freq: Every evening | ORAL | 2 refills | Status: DC

## 2022-02-10 NOTE — Progress Notes (Addendum)
Primary Physician/Referring:  [name], MD  Patient ID: Luis [name], male    DOB: 22-May-1946, 76 y.o.   MRN: [id number]  Chief Complaint  Patient presents with   Follow-up   Pain    HPI: Luis [name]  is a 76 y.o. male  male  with coronary artery disease with history of emergent CABG with LIMA to LAD and SVG to OM by Tharon Aquas Tright.  His past medical history includes hyperlipidemia.  Underwent single chamber Medtronic ICD implantation on 12/15/2017 for primary prevention in view of persistent low LVEF.   I had seen him a month ago, he had just returned from a trip to [place] and [place], has had mild fluid overload state on his ICD which is back to baseline, he has not had any chest pain, but called our office stating that he gets "tired easily".  He still endorses that he walks for 4 miles a day and he does not feel tired but when he does any chores like gardening or working in the yard or at workplace, he gets tired easily after 15 minutes.  Past Medical History:  Diagnosis Date   Acute combined systolic and diastolic heart failure ([place]) 04/16/2017   Acute respiratory failure with hypoxia (HCC) 03/28/2017   AICD (automatic cardioverter/defibrillator) present 12/15/2017   Cardiogenic shock ([place]) 03/28/2017   Chronic combined systolic and diastolic heart failure ([place]) 05/20/2019   Chronic kidney disease    kidney stones;   Dyslipidemia    Encounter for assessment of implantable cardioverter-defibrillator (ICD) 05/20/2019   History of hepatomegaly    History of kidney stones    Sleep apnea    AHI 17 on sleep study 08/23/2013 pt states was given CPAP machine but sent it back - does not use now 12/15/2017   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   Vitamin D deficiency    as noted per H&P per Dr [name] 02/06/2015    Social History   Tobacco Use   Smoking status: Never   Smokeless tobacco: Never  Substance Use Topics   Alcohol use: No   Marital Status: Married    Review of Systems  Constitutional: Positive for malaise/fatigue.  Cardiovascular:  Negative for chest pain, dyspnea on exertion and leg swelling.  Gastrointestinal:  Negative for melena.  Objective  Blood pressure 113/82, pulse 85, temperature 97.8 F (36.6 C), temperature source Temporal, resp. rate 17, height 5' 8"  (1.727 m), weight 181 lb 6.4 oz (82.3 kg), SpO2 97 %. Body mass index is 27.58 kg/m.      02/10/2022   12:41 PM 12/30/2021    8:41 AM 05/02/2021    2:08 PM  Vitals with BMI  Height 5' 8"  5' 8"    Weight 181 lbs 6 oz 176 lbs   BMI 23.36 12.24   Systolic 497 98 530  Diastolic 82 63 63  Pulse 85 78 74       Physical Exam Neck:     Vascular: No JVD.  Cardiovascular:     Rate and Rhythm: Normal rate and regular rhythm.     Pulses: Intact distal pulses.     Heart sounds: Normal heart sounds. No murmur heard.   No gallop.  Pulmonary:     Effort: Pulmonary effort is normal.     Breath sounds: Normal breath sounds.  Abdominal:     General: Bowel sounds are normal.     Palpations: Abdomen is soft.  Musculoskeletal:     Right lower leg: No edema.  Left lower leg: No edema.    Laboratory examination:      Latest Ref Rng & Units 04/25/2019    6:06 PM 12/10/2017   10:56 AM 04/17/2017    4:03 AM  CMP  Glucose 70 - 99 mg/dL 108   104   93    BUN 8 - 23 mg/dL 20   18   12     Creatinine 0.61 - 1.24 mg/dL 1.18   1.00   1.19    Sodium 135 - 145 mmol/L 132   140   134    Potassium 3.5 - 5.1 mmol/L 5.1   5.1   4.3    Chloride 98 - 111 mmol/L 104   104   101    CO2 22 - 32 mmol/L 17   23   24     Calcium 8.9 - 10.3 mg/dL 8.4   9.4   8.5        Latest Ref Rng & Units 04/25/2019    6:06 PM 12/10/2017   10:53 AM 04/16/2017   12:30 AM  CBC  WBC 4.0 - 10.5 K/uL 12.0   6.5   7.7    Hemoglobin 13.0 - 17.0 g/dL 14.8   13.8   9.9    Hematocrit 39.0 - 52.0 % 43.9   40.5   30.8    Platelets 150 - 400 K/uL 180   243   414     Lipid Panel     Component Value Date/Time    CHOL 97 (L) 02/14/2021 0808   TRIG 115 02/14/2021 0808   HDL 41 02/14/2021 0808   LDLCALC 35 02/14/2021 0808   LDLDIRECT 35 02/14/2021 0808    External Labs:   Labs 01/09/2022:  A1c 6.1%.  Hb 15.1/HCT 45.0, platelets 258, normal indicis.  Total cholesterol 63, triglycerides 80, HDL 48, LDL below the reportable limits.  Non-HDL cholesterol 15.  Direct LDL 12.  Serum glucose 96 mg, BUN 13, creatinine 1.05, EGFR 740 mill, potassium 5.1, LFTs normal.    Medications   Allergies  Allergen Reactions   Contrast Media [Iodinated Contrast Media] Itching    Pt states he gets itchy when he has contrast.    Crestor [Rosuvastatin Calcium] Other (See Comments)    myalgias    Current Outpatient Medications:    aspirin EC 81 MG tablet, Take 81 mg by mouth daily., Disp: , Rfl:    cholecalciferol (VITAMIN D) 1000 units tablet, Take 1,000 Units by mouth daily., Disp: , Rfl:    Coenzyme Q10 (COQ10) 200 MG CAPS, Take 200 mg by mouth daily., Disp: , Rfl:    Evolocumab with Infusor ([place]) 420 MG/3.5ML SOCT, Inject 1 Cartridge into the skin every 30 (thirty) days., Disp: 11.5 mL, Rfl: 3   losartan (COZAAR) 25 MG tablet, Take 1 tablet (25 mg total) by mouth every evening., Disp: 30 tablet, Rfl: 2   nitroGLYCERIN (NITROSTAT) 0.4 MG SL tablet, Place 1 tablet (0.4 mg total) under the tongue every 5 (five) minutes as needed for chest pain., Disp: 25 tablet, Rfl: 4   rosuvastatin (CRESTOR) 10 MG tablet, Take 1 tablet (10 mg total) by mouth daily., Disp: 100 tablet, Rfl: 3   vitamin B-12 (CYANOCOBALAMIN) 1000 MCG tablet, Take 1,000 mcg by mouth daily., Disp: , Rfl:    carvedilol (COREG) 3.125 MG tablet, Take 1 tablet (3.125 mg total) by mouth 2 (two) times daily., Disp: 180 tablet, Rfl: 3    Radiology:   No results  found.  Cardiac Studies:   Coronary angiogram 03/25/2017: Mild decrease in LV systolic function, EF 37% with inferior, inferoapical severe hypokinesis. LAD CTO midsegment  just after origin of a large D1, short segment occlusion with type II collaterals from the RCA. Bridging Collaterals also evident. Mid circumflex very large with a 75% stenosis. Large OM1. Mild disease. RCA mild diffuse disease, large vessel. Type II collaterals to the LAD. Distal small PL secondary branch is occluded and has collaterals from left to right.    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  Exercise tetrofosmin stress test  08/08/2019: Normal ECG stress. Patinet exercised for a total of 5 minutes and 59 seconds, achieving approximately 7.05 METs. Baseline heart rate was measured at 74 bpm. A maximum heart rate of 132 beats per minute was achieved, which is 89% of maximum predicted heart rate. Normal BP resonse.  Mild degree medium extent mildly abnormal perfusion consistent with mild (reversible) ischemia located in the mid anteroseptal wall and basal anterior wall (Left Anterior Descending Artery region) of left ventricle. Akinesis of the apical anterior wall, apical cap, apical septal wall, mid anteroseptal wall, mid anterior wall, basal anteroseptal wall and basal anterior wall of the left ventricle.  Stress LV EF: 26%.  High risk study due to severe decrease in LV systolic function and minimal ischemia.  No previous exam available for comparison.  Echocardiogram 01/08/2021: Study Quality: Technically difficult study. Moderately depressed LV systolic function with visual EF 30-35%. Left ventricle cavity is normal in size. Mild to moderate left ventricular hypertrophy. Hypokinetic global wall motion. Doppler evidence of grade I (impaired) diastolic dysfunction, normal LAP.  Compared to prior study dated 07/20/2019: no significant change.  ICD:   Remote single-chamber ICD transmission 01/24/2022: VP <0.1%. Longevity 6 years and 8 months. Lead impedance and thresholds normal. There were no high ventricular rate episodes. During does  not suggest volume overload state. Normal function.  EKG:   EKG 12/30/2021: Normal sinus rhythm with rate of 77 bpm, normal axis, incomplete right bundle branch block.  Nonspecific T abnormality, cannot exclude anterolateral ischemia.  No significant change from 02/13/2021.  Assessment     ICD-10-CM   1. Malaise and fatigue  R53.81    R53.83     2. Coronary artery disease involving native coronary artery of native heart without angina pectoris  I25.10 PCV ECHOCARDIOGRAM COMPLETE    3. Chronic systolic heart failure (HCC)  I50.22 carvedilol (COREG) 3.125 MG tablet    losartan (COZAAR) 25 MG tablet    PCV ECHOCARDIOGRAM COMPLETE    4. Mixed hyperlipidemia  E78.2       Meds ordered this encounter  Medications   losartan (COZAAR) 25 MG tablet    Sig: Take 1 tablet (25 mg total) by mouth every evening.    Dispense:  30 tablet    Refill:  2   Medications Discontinued During This Encounter  Medication Reason   Evolocumab (REPATHA SURECLICK) 902 MG/ML SOAJ Change in therapy   sacubitril-valsartan (ENTRESTO) 24-26 MG Discontinued by provider   fenofibrate (TRICOR) 48 MG tablet Discontinued by provider   ezetimibe (ZETIA) 10 MG tablet Discontinued by provider   carvedilol (COREG) 6.25 MG tablet      Recommendations:   Durante Froio  is a 75 y.o. male  with coronary artery disease with history of emergent CABG with LIMA to LAD and SVG to OM by Tharon Aquas Tright.  His past medical history includes hyperlipidemia.  Underwent single chamber Medtronic ICD implantation on 12/15/2017 for primary prevention in view of persistent low LVEF.   I had seen him a month ago, he had just returned from a trip to [place] and [place], has had mild fluid overload state on his ICD which is back to baseline, he has not had any chest pain, but called our office stating that he gets "tired easily".  He still endorses that he walks for 4 miles a day and he does not feel tired but when he does any chores like  gardening or working in the yard or at workplace, he gets tired easily after 15 minutes.  Due to low blood pressure, Entresto and carvedilol was discontinued by me after I spoke to him over the telephone.  Since then his energy level is improved, blood pressure is now not a problem as his blood pressures dropping into 80s.  Advised him to reduce the dose of carvedilol from 6.25 mg to 3.125 mg twice daily, as he has not been able to tolerate Entresto, will switch him over to losartan 25 mg, he can start with 1/2 tablet in the evening and if he tolerates this he can uptitrate the medication himself to 25 mg daily.  Agree with discontinuing ezetimibe and also fenofibrate as his lipids have improved significantly and he has made lifestyle changes with regard to elevated triglycerides as well.  His LDL is not calculable and direct LDL is 12.  If his symptoms of fatigue persist, could consider holding Crestor for a month to give statin holiday.  He will let me know how he feels.  Today there is no clinical evidence heart failure, lung sounds are clear, no leg edema. Labs reviewed.  He is prediabetic, renal function is normal, CBC is normal.  Ordered Echo    [name], PA-C 02/15/2022, 5:54 AM Office: [phone]

## 2022-02-12 ENCOUNTER — Ambulatory Visit: Payer: Medicare Other | Admitting: Orthopedic Surgery

## 2022-02-12 ENCOUNTER — Ambulatory Visit: Payer: Self-pay

## 2022-02-12 ENCOUNTER — Ambulatory Visit (INDEPENDENT_AMBULATORY_CARE_PROVIDER_SITE_OTHER): Payer: Medicare Other

## 2022-02-12 ENCOUNTER — Encounter: Payer: Self-pay | Admitting: Orthopedic Surgery

## 2022-02-12 DIAGNOSIS — M545 Low back pain, unspecified: Secondary | ICD-10-CM | POA: Diagnosis not present

## 2022-02-12 DIAGNOSIS — M25461 Effusion, right knee: Secondary | ICD-10-CM

## 2022-02-12 DIAGNOSIS — M79604 Pain in right leg: Secondary | ICD-10-CM

## 2022-02-12 DIAGNOSIS — M79605 Pain in left leg: Secondary | ICD-10-CM

## 2022-02-12 DIAGNOSIS — M25462 Effusion, left knee: Secondary | ICD-10-CM | POA: Diagnosis not present

## 2022-02-12 MED ORDER — LIDOCAINE HCL 1 % IJ SOLN
5.0000 mL | INTRAMUSCULAR | Status: AC | PRN
  Administered 2022-02-12: 5 mL

## 2022-02-12 MED ORDER — METHYLPREDNISOLONE ACETATE 40 MG/ML IJ SUSP
40.0000 mg | INTRAMUSCULAR | Status: AC | PRN
  Administered 2022-02-12: 40 mg via INTRA_ARTICULAR

## 2022-02-12 MED ORDER — BUPIVACAINE HCL 0.25 % IJ SOLN
4.0000 mL | INTRAMUSCULAR | Status: AC | PRN
  Administered 2022-02-12: 4 mL via INTRA_ARTICULAR

## 2022-02-12 NOTE — Addendum Note (Signed)
Addended byLaurann Montana on: 02/12/2022 11:27 AM ? ? Modules accepted: Orders ? ?

## 2022-02-12 NOTE — Progress Notes (Signed)
? ?Office Visit Note ?  ?Patient: Luis [name]           ?Date of Birth: Sep 01, 1946           ?MRN: [id number] ?Visit Date: 02/12/2022 ?Requested by: [name], MD ?[address] ?[place],  [place] [id number] ?PCP: [name], MD ? ?Subjective: ?Chief Complaint  ?Patient presents with  ? Lower Back - Pain  ? Right Knee - Pain  ? Left Knee - Pain  ? ? ?HPI: Patient is a 76 year old male with bilateral knee pain lower back pain.  He has to walk for rehab following heart surgery.  Reports low back pain.  He has had prior epidural steroid injections.  Lumbar spine MRI demonstrates bulging disc at multiple levels from 2017.  Has had injections in the past with good relief.  Denies any mechanical symptoms in either knee.  Localizes pain around the joint line in both knees.  Denies any groin pain or numbness and tingling or radicular symptoms. ?             ?ROS: All systems reviewed are negative as they relate to the chief complaint within the history of present illness.  Patient denies  fevers or chills. ? ? ?Assessment & Plan: ?Visit Diagnoses:  ?1. Bilateral leg pain   ?2. Low back pain, unspecified back pain laterality, unspecified chronicity, unspecified whether sciatica present   ?3. Bilateral knee effusions   ? ? ?Plan: Impression is mild left knee effusion with good looking radiographs in terms of absence of significant arthritis.  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  Plan is refer to Dr. Ernestina Patches for another lumbar spine ESI.  Bilateral knee injections performed today.  I think that can help him continue his walking.  Follow-up with [place] as needed.  MRI scanning of the left knee would be the next step if he is not able to continue his walking regimen. ? ?Follow-Up Instructions: No follow-ups on file.  ? ?Orders:  ?Orders Placed This Encounter  ?Procedures  ? XR Lumbar Spine 2-3 Views  ? XR Knee 1-2 Views Right  ? XR Knee 1-2 Views Left  ? ?No orders of the defined types were placed in  this encounter. ? ? ? ? Procedures: ?Large Joint Inj: R knee on 02/12/2022 9:15 AM ?Indications: diagnostic evaluation, joint swelling and pain ?Details: 18 G 1.5 in needle, superolateral approach ? ?Arthrogram: No ? ?Medications: 5 mL lidocaine 1 %; 40 mg methylPREDNISolone acetate 40 MG/ML; 4 mL bupivacaine 0.25 % ?Outcome: tolerated well, no immediate complications ?Procedure, treatment alternatives, risks and benefits explained, specific risks discussed. Consent was given by the patient. Immediately prior to procedure a time out was called to verify the correct patient, procedure, equipment, support staff and site/side marked as required. Patient was prepped and draped in the usual sterile fashion.  ? ? ?Large Joint Inj: L knee on 02/12/2022 9:15 AM ?Indications: diagnostic evaluation, joint swelling and pain ?Details: 18 G 1.5 in needle, superolateral approach ? ?Arthrogram: No ? ?Medications: 5 mL lidocaine 1 %; 40 mg methylPREDNISolone acetate 40 MG/ML; 4 mL bupivacaine 0.25 % ?Outcome: tolerated well, no immediate complications ?Procedure, treatment alternatives, risks and benefits explained, specific risks discussed. Consent was given by the patient. Immediately prior to procedure a time out was called to verify the correct patient, procedure, equipment, support staff and site/side marked as required. Patient was prepped and draped in the usual sterile fashion.  ? ? ? ? ?Clinical  Data: ?No additional findings. ? ?Objective: ?Vital Signs: There were no vitals taken for this visit. ? ?Physical Exam:  ? ?Constitutional: Patient appears well-developed ?HEENT:  ?Head: Normocephalic ?Eyes:EOM are normal ?Neck: Normal range of motion ?Cardiovascular: Normal rate ?Pulmonary/chest: Effort normal ?Neurologic: Patient is alert ?Skin: Skin is warm ?Psychiatric: Patient has normal mood and affect ? ? ?Ortho Exam: Ortho exam demonstrates full active and passive range of motion of bilateral hips and knees.  Pedal pulses  palpable.  Ankle dorsiflexion intact.  No nerve root tension signs.  No paresthesias L1 S1 bilaterally.  Collateral crucial ligaments are stable in both knees with trace effusion left knee no effusion right knee.  Extensor mechanism intact bilaterally.  Reflexes symmetric bilateral patella and Achilles at 0 1+ out of 4. ? ?Specialty Comments:  ?No specialty comments available. ? ?Imaging: ?XR Knee 1-2 Views Left ? ?Result Date: 02/12/2022 ?AP lateral radiographs left knee reviewed.  No acute fracture.  Alignment intact.  Minimal joint space narrowing in the medial lateral and patellofemoral compartments. ? ?XR Knee 1-2 Views Right ? ?Result Date: 02/12/2022 ?AP lateral radiographs right knee.  No acute fracture.  Alignment intact.  Minimal joint space narrowing in the medial lateral and patellofemoral compartments. ? ?XR Lumbar Spine 2-3 Views ? ?Result Date: 02/12/2022 ?AP lateral radiographs lumbar spine reviewed.  Slight loss of lordosis present.  There is significant degenerative disc disease at L5-S1 with no spondylolisthesis or compression fractures.  Remainder lumbar spine intradiscal spaces appear well-maintained.  ? ? ?PMFS History: ?Patient Active Problem List  ? Diagnosis Date Noted  ? Chronic combined systolic and diastolic heart failure ([place]) 05/20/2019  ? Encounter for assessment of implantable cardioverter-defibrillator (ICD) 05/20/2019  ? Ischemic cardiomyopathy 12/15/2017  ? ICD: Medtronic  single chamber Visia AF MRI VR [id number] ICD -  12/15/2017  12/15/2017  ? Dyspnea 04/16/2017  ? S/P CABG x 2 03/28/2017  ? Coronary artery disease 03/25/2017  ? ?Past Medical History:  ?Diagnosis Date  ? Acute combined systolic and diastolic heart failure ([place]) 04/16/2017  ? Acute respiratory failure with hypoxia ([place]) 03/28/2017  ? AICD (automatic cardioverter/defibrillator) present 12/15/2017  ? Cardiogenic shock ([place]) 03/28/2017  ? Chronic combined systolic and diastolic heart failure ([place]) 05/20/2019  ? Chronic  kidney disease   ? kidney stones;  ? Dyslipidemia   ? Encounter for assessment of implantable cardioverter-defibrillator (ICD) 05/20/2019  ? History of hepatomegaly   ? History of kidney stones   ? Sleep apnea   ? AHI 17 on sleep study 08/23/2013 pt states was given CPAP machine but sent it back - does not use now 12/15/2017  ? 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  ? Vitamin D deficiency   ? as noted per H&P per Dr [name] 02/06/2015  ?  ?Family History  ?Problem Relation Age of Onset  ? Healthy Mother   ? Healthy Father   ? Healthy Sister   ? Healthy Brother   ?  ?Past Surgical History:  ?Procedure Laterality Date  ? ASD REPAIR N/A 03/25/2017  ? Procedure: ATRIAL SEPTAL DEFECT (ASD) REPAIR;  Surgeon: [name], MD;  Location: [facility];  Service: Open Heart Surgery;  Laterality: N/A;  ? BIOPSY  03/10/2019  ? Procedure: BIOPSY;  Surgeon: Juanita Craver, MD;  Location: [facility];  Service: Endoscopy;;  ? CARDIAC DEFIBRILLATOR PLACEMENT  12/15/2017  ? COLONOSCOPY WITH PROPOFOL N/A 03/10/2019  ? Procedure: COLONOSCOPY WITH PROPOFOL;  Surgeon: Juanita Craver, MD;  Location: [facility];  Service: Endoscopy;  Laterality: N/A;  ? CORONARY ARTERY BYPASS GRAFT N/A 03/25/2017  ? Procedure: CORONARY ARTERY BYPASS GRAFTING (CABG) x 2 using left internal mammary artery and right greater saphenous leg vein using endosciope.;  Surgeon: [name], MD;  Location: [facility];  Service: Open Heart Surgery;  Laterality: N/A;  ? CYSTOSCOPY W/ RETROGRADES Left 03/26/2015  ? Procedure: CYSTOSCOPY WITH RETROGRADE PYELOGRAM;  Surgeon: [name], MD;  Location: [facility];  Service: Urology;  Laterality: Left;  ? ICD IMPLANT N/A 12/15/2017  ? Procedure: ICD IMPLANT;  Surgeon: [name], MD;  Location: [facility];  Service: Cardiovascular;  Laterality: N/A;  ? LEFT HEART CATH AND CORONARY ANGIOGRAPHY N/A 03/25/2017  ? Procedure: Left Heart Cath and Coronary Angiography;  Surgeon: [name], MD;  Location: [facility];  Service: Cardiovascular;  Laterality: N/A;  ? PLACEMENT OF IMPELLA LEFT VENTRICULAR ASSIST DEVICE  03/25/2017  ? Procedure: PLACEMENT OF [place] LEFT VENTRICULAR ASSIST DEVICE;  Surgeon: Lucianne Lei

## 2022-02-15 ENCOUNTER — Encounter: Payer: Self-pay | Admitting: Cardiology

## 2022-02-15 NOTE — Addendum Note (Signed)
Addended by: Kela Millin on: 02/15/2022 05:55 AM   Modules accepted: Orders

## 2022-02-26 ENCOUNTER — Ambulatory Visit: Payer: Medicare Other

## 2022-02-26 DIAGNOSIS — I5022 Chronic systolic (congestive) heart failure: Secondary | ICD-10-CM

## 2022-02-26 DIAGNOSIS — I2510 Atherosclerotic heart disease of native coronary artery without angina pectoris: Secondary | ICD-10-CM

## 2022-03-28 ENCOUNTER — Encounter: Payer: Self-pay | Admitting: Cardiology

## 2022-04-07 ENCOUNTER — Ambulatory Visit: Payer: Self-pay

## 2022-04-07 ENCOUNTER — Encounter: Payer: Self-pay | Admitting: Physical Medicine and Rehabilitation

## 2022-04-07 ENCOUNTER — Ambulatory Visit: Payer: Medicare Other | Admitting: Physical Medicine and Rehabilitation

## 2022-04-07 VITALS — BP 104/71 | HR 78

## 2022-04-07 DIAGNOSIS — M5416 Radiculopathy, lumbar region: Secondary | ICD-10-CM

## 2022-04-07 MED ORDER — METHYLPREDNISOLONE ACETATE 80 MG/ML IJ SUSP
80.0000 mg | Freq: Once | INTRAMUSCULAR | Status: AC
  Administered 2022-04-07: 80 mg

## 2022-04-07 NOTE — Progress Notes (Signed)
Luis [name] - 76 y.o. male MRN [id number]  Date of birth: 01-Sep-1946  Office Visit Note: Visit Date: 04/07/2022 PCP: [name], MD Referred by: [name], MD  Subjective: Chief Complaint  Patient presents with   Lower Back - Pain   Right Leg - Pain   HPI:  Luis [name] is a 76 y.o. male who comes in today at the request of Dr. Anderson Malta for planned Right L5-S1 Lumbar Interlaminar epidural steroid injection with fluoroscopic guidance.  The patient has failed conservative care including home exercise, medications, time and activity modification.  This injection will be diagnostic and hopefully therapeutic.  Please see requesting physician notes for further details and justification.   He is having mostly axial pain with some referral into the right hip.  Patient's had known history of lumbar disc protrusions at L4-5 and L5-S1.  Cannot have updated MRI Sharol Given to placement of defibrillator.  I did review CT scan of the abdomen and pelvis from 2022 and does appear he has some level of narrowing at L3-4 and L4-5 with facet arthropathy.  Depending on relief with injection diagnostically we would look at potential for facet joint blocks.   ROS Otherwise per HPI.  Assessment & Plan: Visit Diagnoses:    ICD-10-CM   1. Lumbar radiculopathy  M54.16 XR C-ARM NO REPORT    Epidural Steroid injection    methylPREDNISolone acetate (DEPO-MEDROL) injection 80 mg      Plan: No additional findings.   Meds & Orders:  Meds ordered this encounter  Medications   methylPREDNISolone acetate (DEPO-MEDROL) injection 80 mg    Orders Placed This Encounter  Procedures   XR C-ARM NO REPORT   Epidural Steroid injection    Follow-up: Return if symptoms worsen or fail to improve.   Procedures: No procedures performed  Lumbar Epidural Steroid Injection - Interlaminar Approach with Fluoroscopic Guidance  Patient: Luis [name]      Date of Birth: 1946/04/18 MRN: [id number] PCP: [name],  MD      Visit Date: 04/07/2022   Universal Protocol:     Consent Given By: the patient  Position: PRONE  Additional Comments: Vital signs were monitored before and after the procedure. Patient was prepped and draped in the usual sterile fashion. The correct patient, procedure, and site was verified.   Injection Procedure Details:   Procedure diagnoses: Lumbar radiculopathy [M54.16]   Meds Administered:  Meds ordered this encounter  Medications   methylPREDNISolone acetate (DEPO-MEDROL) injection 80 mg     Laterality: Right  Location/Site:  L5-S1  Needle: 3.5 in., 20 ga. Tuohy  Needle Placement: Paramedian epidural  Findings:   -Comments: Excellent flow of contrast into the epidural space.  Procedure Details: Using a paramedian approach from the side mentioned above, the region overlying the inferior lamina was localized under fluoroscopic visualization and the soft tissues overlying this structure were infiltrated with 4 ml. of 1% Lidocaine without Epinephrine. The Tuohy needle was inserted into the epidural space using a paramedian approach.   The epidural space was localized using loss of resistance along with counter oblique bi-planar fluoroscopic views.  After negative aspirate for air, blood, and CSF, a 2 ml. volume of Isovue-250 was injected into the epidural space and the flow of contrast was observed. Radiographs were obtained for documentation purposes.    The injectate was administered into the level noted above.   Additional Comments:  The patient tolerated the procedure well Dressing: 2 x 2 sterile gauze and Band-Aid  Post-procedure details: Patient was observed during the procedure. Post-procedure instructions were reviewed.  Patient left the clinic in stable condition.   Clinical History: MRI LUMBAR SPINE WITHOUT CONTRAST   TECHNIQUE:  Multiplanar, multisequence MR imaging of the lumbar spine was  performed. No intravenous contrast was  administered.  COMPARISON:  CT scan 02/23/2015  FINDINGS:  Segmentation: 5 lumbar type vertebral bodies. The last full  intervertebral disc space is labeled L5-S1.  Alignment:  Normal.  Vertebrae: Normal marrow signal except for mild endplate reactive  changes and a few scattered hemangiomas.  Conus medullaris: Extends to the bottom of L1 level and appears  normal.  Paraspinal and other soft tissues: Unremarkable.  Disc levels:  L1-2:  No significant findings.  L2-3: Minimal annular bulge with mild bilateral lateral recess  encroachment. No spinal or foraminal stenosis.  L3-4: Shallow disc protrusion with flattening of the ventral thecal  sac and mild bilateral lateral recess stenosis. No foraminal  stenosis. Mild facet disease.  L4-5: Focal central disc protrusion with mass effect on the ventral  thecal sac and mild bilateral lateral recess stenosis. No foraminal  stenosis.  L5-S1: Focal central disc protrusion with mass effect on the ventral  thecal sac and mild bilateral lateral recess and foraminal stenosis.  Moderate facet disease.  IMPRESSION:  Disc protrusions at L3-4, L4-5 and L5-S1 as described above.  Electronically Signed    By: Marijo Sanes M.D.    On: 04/23/2016 15:32     Objective:  VS:  HT:    WT:   BMI:     BP:104/71  HR:78bpm  TEMP: ( )  RESP:  Physical Exam Vitals and nursing note reviewed.  Constitutional:      General: He is not in acute distress.    Appearance: Normal appearance. He is not ill-appearing.  HENT:     Head: Normocephalic and atraumatic.     Right Ear: External ear normal.     Left Ear: External ear normal.     Nose: No congestion.  Eyes:     Extraocular Movements: Extraocular movements intact.  Cardiovascular:     Rate and Rhythm: Normal rate.     Pulses: Normal pulses.  Pulmonary:     Effort: Pulmonary effort is normal. No respiratory distress.  Abdominal:     General: There is no distension.     Palpations: Abdomen is  soft.  Musculoskeletal:        General: No tenderness or signs of injury.     Cervical back: Neck supple.     Right lower leg: No edema.     Left lower leg: No edema.     Comments: Patient has good distal strength without clonus.  Skin:    Findings: No erythema or rash.  Neurological:     General: No focal deficit present.     Mental Status: He is alert and oriented to person, place, and time.     Sensory: No sensory deficit.     Motor: No weakness or abnormal muscle tone.     Coordination: Coordination normal.  Psychiatric:        Mood and Affect: Mood normal.        Behavior: Behavior normal.      Imaging: XR C-ARM NO REPORT  Result Date: 04/07/2022 Please see Notes tab for imaging impression.

## 2022-04-07 NOTE — Progress Notes (Signed)
?   Facets Pt state lower back pain that travels down his right leg. Pt state bending over makes the pain worse. Pt state he takes over the counter pain meds to help ease his pain.  Numeric Pain Rating Scale and Functional Assessment Average Pain 4   In the last MONTH (on 0-10 scale) has pain interfered with the following?  1. General activity like being  able to carry out your everyday physical activities such as walking, climbing stairs, carrying groceries, or moving a chair?  Rating(8)   +Driver, -BT, -Dye Allergies.

## 2022-04-07 NOTE — Patient Instructions (Signed)

## 2022-04-07 NOTE — Procedures (Signed)
Lumbar Epidural Steroid Injection - Interlaminar Approach with Fluoroscopic Guidance  Patient: Luis [name]      Date of Birth: 12-29-1945 MRN: [id number] PCP: [name], MD      Visit Date: 04/07/2022   Universal Protocol:     Consent Given By: the patient  Position: PRONE  Additional Comments: Vital signs were monitored before and after the procedure. Patient was prepped and draped in the usual sterile fashion. The correct patient, procedure, and site was verified.   Injection Procedure Details:   Procedure diagnoses: Lumbar radiculopathy [M54.16]   Meds Administered:  Meds ordered this encounter  Medications   methylPREDNISolone acetate (DEPO-MEDROL) injection 80 mg     Laterality: Right  Location/Site:  L5-S1  Needle: 3.5 in., 20 ga. Tuohy  Needle Placement: Paramedian epidural  Findings:   -Comments: Excellent flow of contrast into the epidural space.  Procedure Details: Using a paramedian approach from the side mentioned above, the region overlying the inferior lamina was localized under fluoroscopic visualization and the soft tissues overlying this structure were infiltrated with 4 ml. of 1% Lidocaine without Epinephrine. The Tuohy needle was inserted into the epidural space using a paramedian approach.   The epidural space was localized using loss of resistance along with counter oblique bi-planar fluoroscopic views.  After negative aspirate for air, blood, and CSF, a 2 ml. volume of Isovue-250 was injected into the epidural space and the flow of contrast was observed. Radiographs were obtained for documentation purposes.    The injectate was administered into the level noted above.   Additional Comments:  The patient tolerated the procedure well Dressing: 2 x 2 sterile gauze and Band-Aid    Post-procedure details: Patient was observed during the procedure. Post-procedure instructions were reviewed.  Patient left the clinic in stable condition.

## 2022-04-08 ENCOUNTER — Other Ambulatory Visit: Payer: Self-pay | Admitting: Cardiology

## 2022-04-08 DIAGNOSIS — I25118 Atherosclerotic heart disease of native coronary artery with other forms of angina pectoris: Secondary | ICD-10-CM

## 2022-05-12 ENCOUNTER — Encounter: Payer: Self-pay | Admitting: Cardiology

## 2022-05-20 ENCOUNTER — Other Ambulatory Visit: Payer: Self-pay | Admitting: Cardiology

## 2022-05-20 DIAGNOSIS — I5022 Chronic systolic (congestive) heart failure: Secondary | ICD-10-CM

## 2022-06-08 ENCOUNTER — Encounter: Payer: Self-pay | Admitting: Cardiology

## 2022-07-04 ENCOUNTER — Ambulatory Visit: Payer: Medicare Other | Admitting: Cardiology

## 2022-07-04 ENCOUNTER — Encounter: Payer: Self-pay | Admitting: Cardiology

## 2022-07-04 VITALS — BP 96/66 | HR 75 | Temp 98.2°F | Resp 16 | Ht 68.0 in | Wt 184.2 lb

## 2022-07-04 DIAGNOSIS — E7800 Pure hypercholesterolemia, unspecified: Secondary | ICD-10-CM

## 2022-07-04 DIAGNOSIS — I5022 Chronic systolic (congestive) heart failure: Secondary | ICD-10-CM

## 2022-07-04 DIAGNOSIS — Z4502 Encounter for adjustment and management of automatic implantable cardiac defibrillator: Secondary | ICD-10-CM

## 2022-07-04 DIAGNOSIS — I2510 Atherosclerotic heart disease of native coronary artery without angina pectoris: Secondary | ICD-10-CM

## 2022-07-04 DIAGNOSIS — E782 Mixed hyperlipidemia: Secondary | ICD-10-CM

## 2022-07-04 DIAGNOSIS — Z95810 Presence of automatic (implantable) cardiac defibrillator: Secondary | ICD-10-CM

## 2022-07-04 MED ORDER — ROSUVASTATIN CALCIUM 10 MG PO TABS: 5.0000 mg | ORAL_TABLET | Freq: Every day | ORAL | 3 refills | Status: DC

## 2022-07-04 NOTE — Progress Notes (Signed)
Primary Physician/Referring:  [name], MD  Patient ID: Luis [name], male    DOB: 11-09-1945, 76 y.o.   MRN: [id number]  No chief complaint on file.   HPI: Luis [name]  is a 76 y.o. male  male  with coronary artery disease with history of emergent CABG with LIMA to LAD and SVG to OM by Tharon Aquas Tright.  His past medical history includes hyperlipidemia.  Underwent single chamber Medtronic ICD implantation on 12/15/2017 for primary prevention in view of persistent low LVEF.   He is here on a 77-monthoffice visit.  [facility] did [facility].  He is presently doing well, no recent hospitalization, remains asymptomatic.  Past Medical History:  Diagnosis Date   Acute combined systolic and diastolic heart failure ([facility] 04/16/2017   Acute respiratory failure with hypoxia (HCC) 03/28/2017   AICD (automatic cardioverter/defibrillator) present 12/15/2017   Cardiogenic shock ([facility] 03/28/2017   Chronic combined systolic and diastolic heart failure ([facility] 05/20/2019   Chronic kidney disease    kidney stones;   Dyslipidemia    Encounter for assessment of implantable cardioverter-defibrillator (ICD) 05/20/2019   History of hepatomegaly    History of kidney stones    Sleep apnea    AHI 17 on sleep study 08/23/2013 pt states was given CPAP machine but sent it back - does not use now 12/15/2017   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   Vitamin D deficiency    as noted per H&P per Dr MMellody Drown5/06/2015    Social History   Tobacco Use   Smoking status: Never   Smokeless tobacco: Never  Substance Use Topics   Alcohol use: No   Marital Status: Married   Review of Systems  Cardiovascular:  Negative for chest pain, dyspnea on exertion and leg swelling.  Gastrointestinal:  Negative for melena.   Objective  There were no vitals taken for this visit. There is no height or weight on file to  calculate BMI.      04/07/2022   10:27 AM 02/10/2022   12:41 PM 12/30/2021    8:41 AM  Vitals with BMI  Height  _0  _1   Weight  181 lbs 6 oz 176 lbs  BMI  251.70201.74 Systolic [id number]  Diastolic 71 82 63  Pulse 78 85 78       Physical Exam Neck:     Vascular: No JVD.  Cardiovascular:     Rate and Rhythm: Normal rate and regular rhythm.     Pulses: Intact distal pulses.     Heart sounds: Normal heart sounds. No murmur heard.    No gallop.  Pulmonary:     Effort: Pulmonary effort is normal.     Breath sounds: Normal breath sounds.  Abdominal:     General: Bowel sounds are normal.     Palpations: Abdomen is soft.  Musculoskeletal:     Right lower leg: No edema.     Left lower leg: No edema.     Laboratory examination:      Latest Ref Rng & Units 04/25/2019    6:06 PM 12/10/2017   10:56 AM 04/17/2017    4:03 AM  CMP  Glucose 70 - 99 mg/dL 108  104  93   BUN 8 - 23 mg/dL _2 Creatinine 0.61 - 1.24 mg/dL 1.18  1.00  1.19  Sodium 135 - 145 mmol/L 132  140  134   Potassium 3.5 - 5.1 mmol/L 5.1  5.1  4.3   Chloride 98 - 111 mmol/L 104  104  101   CO2 22 - 32 mmol/L _0 Calcium 8.9 - 10.3 mg/dL 8.4  9.4  8.5       Latest Ref Rng & Units 04/25/2019    6:06 PM 12/10/2017   10:53 AM 04/16/2017   12:30 AM  CBC  WBC 4.0 - 10.5 K/uL 12.0  6.5  7.7   Hemoglobin 13.0 - 17.0 g/dL 14.8  13.8  9.9   Hematocrit 39.0 - 52.0 % 43.9  40.5  30.8   Platelets 150 - 400 K/uL 180  243  414    Lipid Panel     Component Value Date/Time   CHOL 97 (L) 02/14/2021 0808   TRIG 115 02/14/2021 0808   HDL 41 02/14/2021 0808   LDLCALC 35 02/14/2021 0808   LDLDIRECT 35 02/14/2021 0808    External Labs:   Labs 01/09/2022:  A1c 6.1%.  Hb 15.1/HCT 45.0, platelets 258, normal indicis.  Total cholesterol 63, triglycerides 80, HDL 48, LDL below the reportable limits.  Non-HDL cholesterol 15.  Direct LDL 12.  Serum glucose 96 mg, BUN 13, creatinine 1.05, EGFR 740  mill, potassium 5.1, LFTs normal.    Medications   Allergies  Allergen Reactions   Contrast Media [Iodinated Contrast Media] Itching    Pt states he gets itchy when he has contrast.    Grass Pollen([id number]-Swt Vern)     Other reaction(s): Other (See Comments) Sneezing and watery eyes   Crestor [Rosuvastatin Calcium] Other (See Comments)    myalgias    Current Outpatient Medications:    aspirin EC 81 MG tablet, Take 81 mg by mouth daily., Disp: , Rfl:    carvedilol (COREG) 3.125 MG tablet, Take 1 tablet (3.125 mg total) by mouth 2 (two) times daily., Disp: 180 tablet, Rfl: 3   cholecalciferol (VITAMIN D) 1000 units tablet, Take 1,000 Units by mouth daily., Disp: , Rfl:    Coenzyme Q10 (COQ10) 200 MG CAPS, Take 200 mg by mouth daily., Disp: , Rfl:    Evolocumab with Infusor ([place]) 420 MG/3.5ML SOCT, Inject 1 Cartridge into the skin every 30 (thirty) days., Disp: 11.5 mL, Rfl: 3   JARDIANCE 10 MG TABS tablet, Take 10 mg by mouth daily., Disp: , Rfl:    losartan (COZAAR) 25 MG tablet, TAKE 1 TABLET (25 MG TOTAL) BY MOUTH EVERY EVENING., Disp: 30 tablet, Rfl: 2   nitroGLYCERIN (NITROSTAT) 0.4 MG SL tablet, PLACE 1 TABLET (0.4 MG TOTAL) UNDER THE TONGUE EVERY 5 (FIVE) MINUTES AS NEEDED FOR CHEST PAIN., Disp: 25 tablet, Rfl: 4   rosuvastatin (CRESTOR) 10 MG tablet, Take 1 tablet (10 mg total) by mouth daily., Disp: 100 tablet, Rfl: 3   vitamin B-12 (CYANOCOBALAMIN) 1000 MCG tablet, Take 1,000 mcg by mouth daily., Disp: , Rfl:     Radiology:   No results found.  Cardiac Studies:   Coronary angiogram 03/25/2017: Mild decrease in LV systolic function, EF 42% with inferior, inferoapical severe hypokinesis. LAD CTO midsegment just after origin of a large D1, short segment occlusion with type II collaterals from the RCA. Bridging Collaterals also evident. Mid circumflex very large with a 75% stenosis. Large OM1. Mild disease. RCA mild diffuse disease, large vessel. Type II  collaterals to the LAD. Distal small PL secondary branch is  occluded and has collaterals from left to right.    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    Exercise tetrofosmin stress test  08/08/2019: Normal ECG stress. Patinet exercised for a total of 5 minutes and 59 seconds, achieving approximately 7.05 METs. Baseline heart rate was measured at 74 bpm. A maximum heart rate of 132 beats per minute was achieved, which is 89% of maximum predicted heart rate. Normal BP resonse.  Mild degree medium extent mildly abnormal perfusion consistent with mild (reversible) ischemia located in the mid anteroseptal wall and basal anterior wall (Left Anterior Descending Artery region) of left ventricle. Akinesis of the apical anterior wall, apical cap, apical septal wall, mid anteroseptal wall, mid anterior wall, basal anteroseptal wall and basal anterior wall of the left ventricle.  Stress LV EF: 26%.  High risk study due to severe decrease in LV systolic function and minimal ischemia.  No previous exam available for comparison.  Echocardiogram 01/08/2021: Study Quality: Technically difficult study. Moderately depressed LV systolic function with visual EF 30-35%. Left ventricle cavity is normal in size. Mild to moderate left ventricular hypertrophy. Hypokinetic global wall motion. Doppler evidence of grade I (impaired) diastolic dysfunction, normal LAP.  Compared to prior study dated 07/20/2019: no significant change.  Cardiac MRI 05/08/2022: LV is normal in size.  Basal to mid septum akinetic, distal septum severely hypokinetic. Basal to mid anterior severely hypokinetic distal anterior and apex are akinetic.   Lateral wall and inferior wall are hypokinetic. Global LV systolic function severely reduced, LVEF 35%. LA is mild to moderately enlarged. Anterior and anterolateral subendocardial hyperenhancement in the LAD distribution consistent with prior  myocardial infarction with partial reversibility, 50% viable.  ICD: Single chamber  MedtronicVisia AF MRI  12/15/2017   Remote single-chamber ICD transmission 06/25/2022: VP <0.1%. Longevity 5 years and 1 month. Lead impedance and thresholds are normal. There were no high rate episodes. Thoracic impedance is at baseline and does not suggest volume overload state. Normal single-chamber ICD transmission.   Scheduled  In office ICD 07/04/22  Single (S)/Dual (D)/BV (M) S Presenting VS Pacer dependant: No. Underlying NSR.  VP <0.1%.   HVR 0.  Longevity 5.7 Years/Voltage.  Lead measurements: Stable Histogram: Low (L)/normal (N)/high (H)  Normal Patient activity Good. Thoracic impedance: At Baseline and no fluid accumulation.  Observations: Normal ICD.  Changes: Field corrective action changed vector from AX to B  and B to AX  Therapy 4 and 6 (Company recommended).  EKG:   EKG 07/04/2022: Normal sinus rhythm at rate of 73 bpm, normal axis.  Incomplete right bundle branch block.  T wave abnormality, anterolateral ischemia.  No significant change from 12/30/2021.   Assessment     ICD-10-CM   1. Coronary artery disease involving native coronary artery of native heart without angina pectoris  I25.10     2. Chronic systolic heart failure (HCC)  I50.22     3. Hypercholesteremia  E78.00     4. ICD: Medtronic  single chamber Visia AF MRI VR [id number] ICD -  12/15/2017   Z95.810       No orders of the defined types were placed in this encounter.  There are no discontinued medications.  Recommendations:   Luis [name]  is a 76 y.o. male  with coronary artery disease with history of emergent CABG with LIMA to LAD and SVG to OM by Tharon Aquas Tright.  His past medical history includes hyperlipidemia.  Underwent single chamber  Medtronic ICD implantation on 12/15/2017 for primary prevention in view of persistent low LVEF.   He presents here for 73-monthoffice visit.  Presently doing well and states that  he has been exercising regularly and is tolerating low-dose carvedilol and also losartan.  He could not tolerate Entresto and high-dose carvedilol.  He is also been started on Jardiance 10 mg daily [facility].  He has ischemic cardiomyopathy.  I do not think he needs repeat cardiac catheterization.  Although there is viability in the LAD region, he does have LIMA to LAD.  Would recommend continuing present medical therapy as he has remained stable and LVEF has remained stable as well.  With regard to hyperlipidemia, his LDL is markedly reduced and his total cholesterol is also very low.  He is tolerating Repatha, continue the same.  We will reduce the dose of the rosuvastatin from 10 mg to 5 mg daily.  His ICD was interrogated today, normal function.  As per the manufacture recommendation, programming changes were done to correct the field corrective action.  I will see him back in 6 months for follow-up.     JAdrian Prows PA-C 07/04/2022, 6:18 AM Office: [phone]

## 2022-08-04 ENCOUNTER — Other Ambulatory Visit: Payer: Self-pay | Admitting: Cardiology

## 2022-08-04 DIAGNOSIS — I5022 Chronic systolic (congestive) heart failure: Secondary | ICD-10-CM

## 2022-08-04 MED ORDER — CARVEDILOL 3.125 MG PO TABS: 1.5625 mg | ORAL_TABLET | Freq: Two times a day (BID) | ORAL | 3 refills | Status: DC

## 2022-08-05 ENCOUNTER — Encounter: Payer: Self-pay | Admitting: Cardiology

## 2022-08-18 ENCOUNTER — Ambulatory Visit (INDEPENDENT_AMBULATORY_CARE_PROVIDER_SITE_OTHER): Payer: Medicare Other

## 2022-08-18 ENCOUNTER — Other Ambulatory Visit: Payer: Self-pay | Admitting: Cardiology

## 2022-08-18 ENCOUNTER — Ambulatory Visit: Payer: Medicare Other | Admitting: Surgical

## 2022-08-18 ENCOUNTER — Encounter: Payer: Self-pay | Admitting: Surgical

## 2022-08-18 DIAGNOSIS — M79641 Pain in right hand: Secondary | ICD-10-CM

## 2022-08-18 DIAGNOSIS — M1811 Unilateral primary osteoarthritis of first carpometacarpal joint, right hand: Secondary | ICD-10-CM

## 2022-08-18 DIAGNOSIS — I5022 Chronic systolic (congestive) heart failure: Secondary | ICD-10-CM

## 2022-08-18 NOTE — Progress Notes (Signed)
Office Visit Note   Patient: Luis [name]           Date of Birth: 17-Jul-1946           MRN: [id number] Visit Date: 08/18/2022 Requested by: [name], MD [id number] [place],  [place] [address] PCP: [name], MD  Subjective: Chief Complaint  Patient presents with   Right Hand - Pain    HPI: Luis [name] is a 76 y.o. male who presents to the office reporting right hand pain.  Patient states that he has had increased right hand pain that he localizes to the base of the thumb over the last 3 to 4 weeks without any history of recent injury.  He is right-hand dominant.  States that pain bothers him primarily with grip and with thumb range of motion.  No numbness or tingling.  No prior surgery.  He did have a prior fracture in his wrist in 1974 when his hand got caught in a dishwasher machine.  This was successfully casted and he had no lingering pain after this injury.  Pain does not wake him up at night.  No history of gout.  No fevers or chills.  No history of diabetes.  Does have cardiac history and cannot take anti-inflammatories..                ROS: All systems reviewed are negative as they relate to the chief complaint within the history of present illness.  Patient denies fevers or chills.  Assessment & Plan: Visit Diagnoses:  1. Primary osteoarthritis of first carpometacarpal joint of right hand   2. Pain in right hand     Plan: Patient is a 76 year old male who presents for evaluation of right hand pain.  He states that he has had atraumatic onset of pain over the last 4 weeks that bothers him primarily with thumb range of motion and grip strength.  He has radiographs taken today demonstrating moderate degenerative changes of the first [facility] joint and mild to moderate degenerative changes of the STT joint of the right hand.  Discussed the options available patient including occupational therapy versus Voltaren gel versus ultrasound-guided injection today.  He would like to  try Voltaren gel and he will call the office if he has no improvement in symptoms after about 4 weeks of continued use.  Follow-up as needed.  Follow-Up Instructions: No follow-ups on file.   Orders:  Orders Placed This Encounter  Procedures   XR Hand Complete Right   No orders of the defined types were placed in this encounter.     Procedures: No procedures performed   Clinical Data: No additional findings.  Objective: Vital Signs: There were no vitals taken for this visit.  Physical Exam:  Constitutional: Patient appears well-developed HEENT:  Head: Normocephalic Eyes:EOM are normal Neck: Normal range of motion Cardiovascular: Normal rate Pulmonary/chest: Effort normal Neurologic: Patient is alert Skin: Skin is warm Psychiatric: Patient has normal mood and affect  Ortho Exam: Ortho exam demonstrates right hand with intact EPL, FPL, finger abduction.  Grip strength excellent.  He has tenderness primarily over the first [facility] with increased pain with thumb circumduction.  He has no tenderness over the scaphoid tubercle or anatomic snuffbox.  No tenderness over the 3-4 portal, 4-5 portal, ulnar fovea.  No cellulitis or skin changes noted.  No swelling or bruising noted.  Specialty Comments:  MRI LUMBAR SPINE WITHOUT CONTRAST   TECHNIQUE:  Multiplanar, multisequence MR imaging of the lumbar spine  was  performed. No intravenous contrast was administered.  COMPARISON:  CT scan 02/23/2015  FINDINGS:  Segmentation: 5 lumbar type vertebral bodies. The last full  intervertebral disc space is labeled L5-S1.  Alignment:  Normal.  Vertebrae: Normal marrow signal except for mild endplate reactive  changes and a few scattered hemangiomas.  Conus medullaris: Extends to the bottom of L1 level and appears  normal.  Paraspinal and other soft tissues: Unremarkable.  Disc levels:  L1-2:  No significant findings.  L2-3: Minimal annular bulge with mild bilateral lateral recess   encroachment. No spinal or foraminal stenosis.  L3-4: Shallow disc protrusion with flattening of the ventral thecal  sac and mild bilateral lateral recess stenosis. No foraminal  stenosis. Mild facet disease.  L4-5: Focal central disc protrusion with mass effect on the ventral  thecal sac and mild bilateral lateral recess stenosis. No foraminal  stenosis.  L5-S1: Focal central disc protrusion with mass effect on the ventral  thecal sac and mild bilateral lateral recess and foraminal stenosis.  Moderate facet disease.  IMPRESSION:  Disc protrusions at L3-4, L4-5 and L5-S1 as described above.  Electronically Signed    By: Marijo Sanes M.D.    On: 04/23/2016 15:32  Imaging: No results found.   PMFS History: Patient Active Problem List   Diagnosis Date Noted   Chronic combined systolic and diastolic heart failure ([place]) 05/20/2019   Encounter for assessment of implantable cardioverter-defibrillator (ICD) 05/20/2019   Ischemic cardiomyopathy 12/15/2017   ICD: Medtronic  single chamber Visia AF MRI VR [id number] ICD -  12/15/2017  12/15/2017   Dyspnea 04/16/2017   S/P CABG x 2 03/28/2017   Coronary artery disease 03/25/2017   Past Medical History:  Diagnosis Date   Acute combined systolic and diastolic heart failure ([place]) 04/16/2017   Acute respiratory failure with hypoxia ([place]) 03/28/2017   AICD (automatic cardioverter/defibrillator) present 12/15/2017   Cardiogenic shock ([place]) 03/28/2017   Chronic combined systolic and diastolic heart failure ([place]) 05/20/2019   Chronic kidney disease    kidney stones;   Dyslipidemia    Encounter for assessment of implantable cardioverter-defibrillator (ICD) 05/20/2019   History of hepatomegaly    History of kidney stones    Sleep apnea    AHI 17 on sleep study 08/23/2013 pt states was given CPAP machine but sent it back - does not use now 12/15/2017   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   Vitamin D deficiency    as noted per  H&P per Dr [name] 02/06/2015    Family History  Problem Relation Age of Onset   Healthy Mother    Healthy Father    Healthy Sister    Healthy Brother     Past Surgical History:  Procedure Laterality Date   ASD REPAIR N/A 03/25/2017   Procedure: ATRIAL SEPTAL DEFECT (ASD) REPAIR;  Surgeon: [name], MD;  Location: [place];  Service: Open Heart Surgery;  Laterality: N/A;   BIOPSY  03/10/2019   Procedure: BIOPSY;  Surgeon: Juanita Craver, MD;  Location: [facility];  Service: Endoscopy;;   CARDIAC DEFIBRILLATOR PLACEMENT  12/15/2017   COLONOSCOPY WITH PROPOFOL N/A 03/10/2019   Procedure: COLONOSCOPY WITH PROPOFOL;  Surgeon: Juanita Craver, MD;  Location: [facility];  Service: Endoscopy;  Laterality: N/A;   CORONARY ARTERY BYPASS GRAFT N/A 03/25/2017   Procedure: CORONARY ARTERY BYPASS GRAFTING (CABG) x 2 using left internal mammary artery and right greater saphenous leg vein using endosciope.;  Surgeon: [name], MD;  [facility]  OR;  Service: Open Heart Surgery;  Laterality: N/A;   CYSTOSCOPY W/ RETROGRADES Left 03/26/2015   Procedure: CYSTOSCOPY WITH RETROGRADE PYELOGRAM;  Surgeon: [name], MD;  Location: [facility];  Service: Urology;  Laterality: Left;   ICD IMPLANT N/A 12/15/2017   Procedure: ICD IMPLANT;  Surgeon: [name], MD;  Location: [place] [facility];  Service: Cardiovascular;  Laterality: N/A;   LEFT HEART CATH AND CORONARY ANGIOGRAPHY N/A 03/25/2017   Procedure: Left Heart Cath and Coronary Angiography;  Surgeon: [name], MD;  Location: [place] [facility];  Service: Cardiovascular;  Laterality: N/A;   PLACEMENT OF IMPELLA LEFT VENTRICULAR ASSIST DEVICE  03/25/2017   Procedure: PLACEMENT OF IMPELLA LEFT VENTRICULAR ASSIST DEVICE;  Surgeon: [name], MD;  Location: [place];  Service: Open Heart Surgery;;   REMOVAL OF IMPELLA LEFT VENTRICULAR ASSIST DEVICE N/A 03/31/2017   Procedure: REMOVAL OF IMPELLA LEFT VENTRICULAR ASSIST DEVICE;  Surgeon: [name], MD;  Location: [place];  Service: Open Heart Surgery;  Laterality: N/A;   ROBOT ASSISTED PYELOPLASTY Left 03/26/2015   Procedure: ROBOTIC ASSISTED PYELOPLASTY WITH STENT PLACEMENT, REMOVAL OF STONE AND CYST DECORTICATION;  Surgeon: [name], MD;  Location: [facility];  Service: Urology;  Laterality: Left;   TEE WITHOUT CARDIOVERSION N/A 03/25/2017   Procedure: TRANSESOPHAGEAL ECHOCARDIOGRAM (TEE);  Surgeon: [name], [name], MD;  Location: [place];  Service: Open Heart Surgery;  Laterality: N/A;   TEE WITHOUT CARDIOVERSION N/A 03/31/2017   Procedure: TRANSESOPHAGEAL ECHOCARDIOGRAM (TEE);  Surgeon: [name], [name], MD;  Location: [place];  Service: Open Heart Surgery;  Laterality: N/A;   Social History   Occupational History   Not on file  Tobacco Use   Smoking status: Never   Smokeless tobacco: Never  Vaping Use   Vaping Use: Never used  Substance and Sexual Activity   Alcohol use: No   Drug use: No   Sexual activity: Not on file

## 2022-09-09 ENCOUNTER — Encounter: Payer: Self-pay | Admitting: Cardiology

## 2022-10-03 ENCOUNTER — Encounter: Payer: Self-pay | Admitting: Cardiology

## 2022-10-05 NOTE — Progress Notes (Signed)
Labs 10/01/2022:  Total cholesterol 81, triglycerides 102, HDL 47, LDL 14.  Hb 13.9/HCT 42.7, platelets 267.  Serum glucose 93 mg, sodium 141, potassium 5.2, BUN 12, creatinine 1.1, EGFR 69 mL, LFTs normal.

## 2022-10-08 ENCOUNTER — Encounter: Payer: Self-pay | Admitting: Cardiology

## 2022-10-10 ENCOUNTER — Encounter: Payer: Self-pay | Admitting: Cardiology

## 2022-10-10 ENCOUNTER — Ambulatory Visit: Payer: Medicare Other | Admitting: Cardiology

## 2022-10-10 VITALS — BP 122/78 | HR 76 | Ht 68.0 in | Wt 184.2 lb

## 2022-10-10 DIAGNOSIS — I25118 Atherosclerotic heart disease of native coronary artery with other forms of angina pectoris: Secondary | ICD-10-CM

## 2022-10-10 DIAGNOSIS — E7800 Pure hypercholesterolemia, unspecified: Secondary | ICD-10-CM

## 2022-10-10 DIAGNOSIS — Z95810 Presence of automatic (implantable) cardiac defibrillator: Secondary | ICD-10-CM

## 2022-10-10 DIAGNOSIS — I5022 Chronic systolic (congestive) heart failure: Secondary | ICD-10-CM

## 2022-10-10 MED ORDER — RANOLAZINE ER 500 MG PO TB12: 500.0000 mg | ORAL_TABLET | Freq: Two times a day (BID) | ORAL | 2 refills | Status: DC

## 2022-10-10 NOTE — Progress Notes (Signed)
Primary Physician/Referring:  [name], MD  Patient ID: Luis [name], male    DOB: 08/16/1946, 77 y.o.   MRN: [id number]  Chief Complaint  Patient presents with   Coronary artery disease involving native coronary artery of   Follow-up    Fatigue    HPI: Luis [name]  is a 77 y.o. male  male  with coronary artery disease with history of emergent CABG with LIMA to LAD and SVG to OM by Tharon Aquas Tright.  His past medical history includes hyperlipidemia.  Underwent single chamber Medtronic ICD implantation on 12/15/2017 for primary prevention in view of persistent low LVEF.   Patient is on very minimal dose of medications with regard to heart failure management due to very soft blood pressure and inability to uptitrate Coreg or Entresto and is presently on losartan and minimal dose of Coreg.  Jardiance was also added.  Patient called me this afternoon stating that he is having some chest pain and hence I am seeing him on an urgent basis.  Chest pain is described as heaviness in the left part of the chest that comes in the evening.  Over the past 4 to 5 days he is also noticed mild worsening dyspnea.  No rest pain, most of the episodes are occurring with exertion activity and easily relieved.  He has not used any sublingual nitroglycerin. I have Past Medical History:  Diagnosis Date   Acute combined systolic and diastolic heart failure ([place]) 04/16/2017   Acute respiratory failure with hypoxia ([facility]) 03/28/2017   AICD (automatic cardioverter/defibrillator) present 12/15/2017   Cardiogenic shock ([place]) 03/28/2017   Chronic combined systolic and diastolic heart failure ([place]) 05/20/2019   Chronic kidney disease    kidney stones;   Dyslipidemia    Encounter for assessment of implantable cardioverter-defibrillator (ICD) 05/20/2019   History of hepatomegaly    History of kidney stones    Sleep apnea    AHI 17 on sleep study 08/23/2013 pt states was given CPAP machine but sent it back - does not  use now 12/15/2017   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   Vitamin D deficiency    as noted per H&P per Dr [name] 02/06/2015    Social History   Tobacco Use   Smoking status: Never   Smokeless tobacco: Never  Substance Use Topics   Alcohol use: No   Marital Status: Married   Review of Systems  Cardiovascular:  Positive for chest pain. Negative for dyspnea on exertion and leg swelling.  Gastrointestinal:  Negative for melena.   Objective  Blood pressure 122/78, pulse 76, height '5\' 8"'$  (1.727 m), weight 184 lb 3.2 oz (83.6 kg), SpO2 96 %. Body mass index is 28.01 kg/m.      10/10/2022    2:15 PM 07/04/2022   10:02 AM 04/07/2022   10:27 AM  Vitals with BMI  Height '5\' 8"'$  '5\' 8"'$    Weight 184 lbs 3 oz 184 lbs 3 oz   BMI 24.40 10.27   Systolic 253 96 664  Diastolic 78 66 71  Pulse 76 75 78       Physical Exam Neck:     Vascular: No JVD.  Cardiovascular:     Rate and Rhythm: Normal rate and regular rhythm.     Pulses: Intact distal pulses.     Heart sounds: Normal heart sounds. No murmur heard.    No gallop.  Pulmonary:     Effort: Pulmonary effort is normal.     Breath  sounds: Normal breath sounds.  Abdominal:     General: Bowel sounds are normal.     Palpations: Abdomen is soft.  Musculoskeletal:     Right lower leg: No edema.     Left lower leg: No edema.     Laboratory examination:   External Labs:   Labs 10/01/2022:  Total cholesterol 81, triglycerides 102, HDL 47, LDL 14.  Hb 13.9/HCT 42.7, platelets 267.  Serum glucose 93 mg, sodium 141, potassium 5.2, BUN 12, creatinine 1.1, EGFR 69 mL, LFTs normal.  Labs 01/09/2022:  A1c 6.1%.  Hb 15.1/HCT 45.0, platelets 258, normal indicis.  Total cholesterol 63, triglycerides 80, HDL 48, LDL below the reportable limits.  Non-HDL cholesterol 15.  Direct LDL 12.  Serum glucose 96 mg, BUN 13, creatinine 1.05, EGFR 740 mill, potassium 5.1, LFTs normal.    Medications   Allergies  Allergen  Reactions   Contrast Media [Iodinated Contrast Media] Itching    Pt states he gets itchy when he has contrast.    Grass Pollen(K-O-[id number]-T-Swt Vern)     Other reaction(s): Other (See Comments) Sneezing and watery eyes   Crestor [Rosuvastatin Calcium] Other (See Comments)    myalgias    Current Outpatient Medications:    aspirin EC 81 MG tablet, Take 81 mg by mouth daily., Disp: , Rfl:    carvedilol (COREG) 3.125 MG tablet, Take 0.5 tablets (1.5625 mg total) by mouth 2 (two) times daily., Disp: 180 tablet, Rfl: 3   cholecalciferol (VITAMIN D) 1000 units tablet, Take 1,000 Units by mouth daily., Disp: , Rfl:    Coenzyme Q10 (COQ10) 200 MG CAPS, Take 200 mg by mouth daily., Disp: , Rfl:    Evolocumab (REPATHA SURECLICK) 621 MG/ML SOAJ, Inject 140 mg into the skin every 28 (twenty-eight) days. Will reduce dose 10/10/22 LDL 18, Disp: , Rfl:    JARDIANCE 10 MG TABS tablet, Take 10 mg by mouth daily., Disp: , Rfl:    losartan (COZAAR) 25 MG tablet, TAKE 1 TABLET (25 MG TOTAL) BY MOUTH EVERY EVENING., Disp: 30 tablet, Rfl: 2   nitroGLYCERIN (NITROSTAT) 0.4 MG SL tablet, PLACE 1 TABLET (0.4 MG TOTAL) UNDER THE TONGUE EVERY 5 (FIVE) MINUTES AS NEEDED FOR CHEST PAIN., Disp: 25 tablet, Rfl: 4   ranolazine (RANEXA) 500 MG 12 hr tablet, Take 1 tablet (500 mg total) by mouth 2 (two) times daily., Disp: 60 tablet, Rfl: 2   rosuvastatin (CRESTOR) 10 MG tablet, Take 0.5 tablets (5 mg total) by mouth daily., Disp: 90 tablet, Rfl: 3   vitamin B-12 (CYANOCOBALAMIN) 1000 MCG tablet, Take 1,000 mcg by mouth daily., Disp: , Rfl:     Radiology:   No results found.  Cardiac Studies:   Coronary angiogram 03/25/2017: Mild decrease in LV systolic function, EF 30% with inferior, inferoapical severe hypokinesis. LAD CTO midsegment just after origin of a large D1, short segment occlusion with type II collaterals from the RCA. Bridging Collaterals also evident. Mid circumflex very large with a 75% stenosis. Large OM1. Mild  disease. RCA mild diffuse disease, large vessel. Type II collaterals to the LAD. Distal small PL secondary branch is occluded and has collaterals from left to right.    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 Patinet exercised for a total of 5 minutes and 59 seconds, achieving approximately 7.05 METs. Baseline heart rate was  measured at 74 bpm. A maximum heart rate of 132 beats per minute was achieved, which is 89% of maximum predicted heart rate. Normal BP resonse.  Mild degree medium extent mildly abnormal perfusion consistent with mild (reversible) ischemia located in the mid anteroseptal wall and basal anterior wall (Left Anterior Descending Artery region) of left ventricle. Akinesis of the apical anterior wall, apical cap, apical septal wall, mid anteroseptal wall, mid anterior wall, basal anteroseptal wall and basal anterior wall of the left ventricle.  Stress LV EF: 26%.  High risk study due to severe decrease in LV systolic function and minimal ischemia.  No previous exam available for comparison.  Echocardiogram 01/08/2021: Study Quality: Technically difficult study. Moderately depressed LV systolic function with visual EF 30-35%. Left ventricle cavity is normal in size. Mild to moderate left ventricular hypertrophy. Hypokinetic global wall motion. Doppler evidence of grade I (impaired) diastolic dysfunction, normal LAP.  Compared to prior study dated 07/20/2019: no significant change.  Cardiac MRI 05/08/2022: LV is normal in size.  Basal to mid septum akinetic, distal septum severely hypokinetic. Basal to mid anterior severely hypokinetic distal anterior and apex are akinetic.   Lateral wall and inferior wall are hypokinetic. Global LV systolic function severely reduced, LVEF 35%. LA is mild to moderately enlarged. Anterior and  anterolateral subendocardial hyperenhancement in the LAD distribution consistent with prior myocardial infarction with partial reversibility, 50% viable.  ICD: Single chamber  MedtronicVisia AF MRI  12/15/2017   Remote single-chamber ICD transmission 10/09/2022: Longevity 5 years and 4 months.  VP <0.1%. Lead impedance and thresholds are normal.  There were no high rate episodes.  Thoracic impedance does not suggest volume overload state.  Normal single-chamber ICD transmission.    Scheduled  In office ICD 07/04/22  Single (S)/Dual (D)/BV (M) S Presenting VS Pacer dependant: No. Underlying NSR.  VP <0.1%.   HVR 0.  Longevity 5.7 Years/Voltage.  Lead measurements: Stable Histogram: Low (L)/normal (N)/high (H)  Normal Patient activity Good. Thoracic impedance: At Baseline and no fluid accumulation.  Observations: Normal ICD.  Changes: Field corrective action changed vector from AX to B  and B to AX  Therapy 4 and 6 (Company recommended).    EKG:   EKG 10/10/2022: Normal sinus rhythm at rate of 75 bpm, normal axis.  Incomplete right bundle branch block.  Nonspecific T abnormality.  Compared to 07/04/2022, no significant change.  Assessment     ICD-10-CM   1. Coronary artery disease of native artery of native heart with stable angina pectoris (HCC)  I25.118 ranolazine (RANEXA) 500 MG 12 hr tablet    EKG 12-Lead    2. Chronic systolic heart failure (HCC)  I50.22     3. ICD:  Single chamber  MedtronicVisia AF MRI VR [id number] ICD -  12/15/2017  Z95.810     4. Hypercholesteremia  E78.00       Meds ordered this encounter  Medications   ranolazine (RANEXA) 500 MG 12 hr tablet    Sig: Take 1 tablet (500 mg total) by mouth 2 (two) times daily.    Dispense:  60 tablet    Refill:  2   Medications Discontinued During This Encounter  Medication Reason   Evolocumab with Infusor ([place]) 420 MG/3.5ML SOCT Change in therapy    Recommendations:   Luis [name]  is a 77  y.o. male  with coronary artery disease with history of emergent CABG with LIMA to LAD and SVG to OM by Tharon Aquas Tright.  His past medical history includes hyperlipidemia, single chamber Medtronic ICD implantation on 12/15/2017 for primary prevention in view of persistent low LVEF.   1. Coronary artery disease of native artery of native heart with stable angina pectoris ([place]) Patient's symptoms of left-sided chest pain could be anginal equivalent.  Advised him to try sublingual nitroglycerin and see whether he would improve with regard to his symptoms, as his symptoms appear to be on and off for several days, do not think ACS, no significant EKG change, as his blood pressure is very soft unable to uptitrate antianginal therapy.  Will start him on Ranexa 500 mg p.o. twice daily, after 1 week if he has not had any improvement in overall wellbeing, he could increase this to 1000 mg twice daily.  Patient will contact me if he does so.  2. Chronic systolic heart failure ([place]) Patient presently is only on low-dose of losartan, carvedilol and Jardiance at 10 mg daily as he has not been able to tolerate any increasing dose due to severe hypotension and dizziness.  Presently not in acute decompensated heart failure.  Continue present medications.  His recent ICD evaluation reveals that he indeed was heading towards heart failure but he has not crossed the line and thoracic impedance appears to be going back to the baseline.  I reviewed this data with the patient.  3. ICD:  Single chamber  MedtronicVisia AF MRI VR [id number] ICD -  12/15/2017 His ICD is functioning normally.  As discussed above, please see image, he was heading towards heart failure but has reverted back to baseline which correlates with his symptom onset about 4 to 5 days ago. He has an appointment to see me back sometime in April 2024, he will keep this appointment unless he has new symptoms or change in symptoms he is advised to contact me so I can  see him back sooner.  4. Hypercholesteremia His LDL is very low, he is presently on minimal dose of Crestor as he was unable to tolerate statins but also on Repatha 140 mg every 2 weeks.  I will change this to once every month and see how he does with this.  We can check his LDL at 3 weeks closer to his next dose at 4 weeks and see whether there has been any significant change in his lipids.   Luis Prows, MD, [facility] 10/10/2022, 4:28 PM Office: [phone] Fax: [phone] Pager: [phone]

## 2022-10-14 ENCOUNTER — Encounter: Payer: Self-pay | Admitting: Cardiology

## 2022-10-17 ENCOUNTER — Other Ambulatory Visit: Payer: Self-pay | Admitting: Cardiology

## 2022-10-17 DIAGNOSIS — E7800 Pure hypercholesterolemia, unspecified: Secondary | ICD-10-CM

## 2022-10-21 ENCOUNTER — Encounter: Payer: Self-pay | Admitting: Cardiology

## 2022-10-22 NOTE — Progress Notes (Signed)
Labs 10/17/2022:  Serum glucose 93 mg, sodium 135 mg, potassium 53, BUN 12, creatinine 1.1, EGFR 69 mL

## 2022-10-29 ENCOUNTER — Telehealth: Payer: Self-pay

## 2022-10-29 NOTE — Telephone Encounter (Signed)
Copied from staff message. Please call pt. Daughter has question about ordering labs. He is out of town visiting her. [phone]   Spoke with the patient and gave him Dr. Irven Shelling review of the labs. Patient verbalized understanding.

## 2022-11-10 ENCOUNTER — Encounter: Payer: Self-pay | Admitting: Cardiology

## 2022-12-01 ENCOUNTER — Other Ambulatory Visit: Payer: Self-pay | Admitting: Cardiology

## 2022-12-01 DIAGNOSIS — I5022 Chronic systolic (congestive) heart failure: Secondary | ICD-10-CM

## 2022-12-24 ENCOUNTER — Encounter: Payer: Self-pay | Admitting: Cardiology

## 2023-01-02 NOTE — Progress Notes (Unsigned)
Primary Physician/Referring:  Ralene OkMoreira, Roy, MD  Patient ID: Luis [name], male    DOB: November 16, 1945, 77 y.o.   MRN: [id number]  No chief complaint on file.   HPI: Luis [name]  is a 77 y.o. male  male  with coronary artery disease with history of emergent CABG with LIMA to LAD and SVG to OM by Kathlee NationsPeter Van Tright.  His past medical history includes hyperlipidemia.  Underwent single chamber Medtronic ICD implantation on 12/15/2017 for primary prevention in view of persistent low LVEF.   Patient is on very minimal dose of medications with regard to heart failure management due to very soft blood pressure and inability to uptitrate Coreg or Entresto and is presently on losartan and minimal dose of Coreg.  Jardiance was also added.  Patient called me this afternoon stating that he is having some chest pain and hence I am seeing him on an urgent basis.  Chest pain is described as heaviness in the left part of the chest that comes in the evening.  Over the past 4 to 5 days he is also noticed mild worsening dyspnea.  No rest pain, most of the episodes are occurring with exertion activity and easily relieved.  He has not used any sublingual nitroglycerin. I have Past Medical History:  Diagnosis Date   Acute combined systolic and diastolic heart failure (HCC) 04/16/2017   Acute respiratory failure with hypoxia (HCC) 03/28/2017   AICD (automatic cardioverter/defibrillator) present 12/15/2017   Cardiogenic shock (HCC) 03/28/2017   Chronic combined systolic and diastolic heart failure (HCC) 05/20/2019   Chronic kidney disease    kidney stones;   Dyslipidemia    Encounter for assessment of implantable cardioverter-defibrillator (ICD) 05/20/2019   History of hepatomegaly    History of kidney stones    Sleep apnea    AHI 17 on sleep study 08/23/2013 pt states was given CPAP machine but sent it back - does not use now 12/15/2017   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   Vitamin D deficiency     as noted per H&P per Dr Ludwig ClarksMoreira 02/06/2015    Social History   Tobacco Use   Smoking status: Never   Smokeless tobacco: Never  Substance Use Topics   Alcohol use: No   Marital Status: Married   Review of Systems  Cardiovascular:  Positive for chest pain. Negative for dyspnea on exertion and leg swelling.  Gastrointestinal:  Negative for melena.   Objective  There were no vitals taken for this visit. There is no height or weight on file to calculate BMI.      10/10/2022    2:15 PM 07/04/2022   10:02 AM 04/07/2022   10:27 AM  Vitals with BMI  Height 5\' 8"  5\' 8"    Weight 184 lbs 3 oz 184 lbs 3 oz   BMI 28.01 28.01   Systolic 122 96 104  Diastolic 78 66 71  Pulse 76 75 78       Physical Exam Neck:     Vascular: No JVD.  Cardiovascular:     Rate and Rhythm: Normal rate and regular rhythm.     Pulses: Intact distal pulses.     Heart sounds: Normal heart sounds. No murmur heard.    No gallop.  Pulmonary:     Effort: Pulmonary effort is normal.     Breath sounds: Normal breath sounds.  Abdominal:     General: Bowel sounds are normal.     Palpations: Abdomen is soft.  Musculoskeletal:  Right lower leg: No edema.     Left lower leg: No edema.    Laboratory examination:   External Labs:   Labs 10/17/2022:  Serum glucose 93 mg, sodium 135 mg, potassium 53, BUN 12, creatinine 1.1, EGFR 69 mL  Labs 10/01/2022:  Total cholesterol 81, triglycerides 102, HDL 47, LDL 14.  Hb 13.9/HCT 42.7, platelets 267.  Serum glucose 93 mg, sodium 141, potassium 5.2, BUN 12, creatinine 1.1, EGFR 69 mL, LFTs normal.  Labs 01/09/2022:  A1c 6.1%.  Hb 15.1/HCT 45.0, platelets 258, normal indicis.  Total cholesterol 63, triglycerides 80, HDL 48, LDL below the reportable limits.  Non-HDL cholesterol 15.  Direct LDL 12.  Serum glucose 96 mg, BUN 13, creatinine 1.05, EGFR 740 mill, potassium 5.1, LFTs normal.    Medications   Allergies  Allergen Reactions   Contrast Media  [Iodinated Contrast Media] Itching    Pt states he gets itchy when he has contrast.    Grass Pollen(K-O-R-T-Swt Vern)     Other reaction(s): Other (See Comments) Sneezing and watery eyes   Crestor [Rosuvastatin Calcium] Other (See Comments)    myalgias    Current Outpatient Medications:    aspirin EC 81 MG tablet, Take 81 mg by mouth daily., Disp: , Rfl:    carvedilol (COREG) 3.125 MG tablet, Take 0.5 tablets (1.5625 mg total) by mouth 2 (two) times daily., Disp: 180 tablet, Rfl: 3   cholecalciferol (VITAMIN D) 1000 units tablet, Take 1,000 Units by mouth daily., Disp: , Rfl:    Coenzyme Q10 (COQ10) 200 MG CAPS, Take 200 mg by mouth daily., Disp: , Rfl:    Evolocumab (REPATHA SURECLICK) 140 MG/ML SOAJ, Inject 140 mg into the skin every 28 (twenty-eight) days. Will reduce dose 10/10/22 LDL 18, Disp: , Rfl:    JARDIANCE 10 MG TABS tablet, Take 10 mg by mouth daily., Disp: , Rfl:    losartan (COZAAR) 25 MG tablet, TAKE 1 TABLET (25 MG TOTAL) BY MOUTH EVERY EVENING., Disp: 30 tablet, Rfl: 2   nitroGLYCERIN (NITROSTAT) 0.4 MG SL tablet, PLACE 1 TABLET (0.4 MG TOTAL) UNDER THE TONGUE EVERY 5 (FIVE) MINUTES AS NEEDED FOR CHEST PAIN., Disp: 25 tablet, Rfl: 4   ranolazine (RANEXA) 500 MG 12 hr tablet, Take 1 tablet (500 mg total) by mouth 2 (two) times daily., Disp: 60 tablet, Rfl: 2   rosuvastatin (CRESTOR) 10 MG tablet, TAKE 1 TABLET (10 MG TOTAL) BY MOUTH DAILY., Disp: 100 tablet, Rfl: 3   vitamin B-12 (CYANOCOBALAMIN) 1000 MCG tablet, Take 1,000 mcg by mouth daily., Disp: , Rfl:     Radiology:   No results found.  Cardiac Studies:   Coronary angiogram 03/25/2017: Mild decrease in LV systolic function, EF 45% with inferior, inferoapical severe hypokinesis. LAD CTO midsegment just after origin of a large D1, short segment occlusion with type II collaterals from the RCA. Bridging Collaterals also evident. Mid circumflex very large with a 75% stenosis. Large OM1. Mild disease. RCA mild diffuse  disease, large vessel. Type II collaterals to the LAD. Distal small PL secondary branch is occluded and has collaterals from left to right.    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 Patinet exercised for a total of 5 minutes and 59 seconds, achieving approximately 7.05 METs. Baseline heart rate was measured at 74 bpm. A maximum heart rate of 132  beats per minute was achieved, which is 89% of maximum predicted heart rate. Normal BP resonse.  Mild degree medium extent mildly abnormal perfusion consistent with mild (reversible) ischemia located in the mid anteroseptal wall and basal anterior wall (Left Anterior Descending Artery region) of left ventricle. Akinesis of the apical anterior wall, apical cap, apical septal wall, mid anteroseptal wall, mid anterior wall, basal anteroseptal wall and basal anterior wall of the left ventricle.  Stress LV EF: 26%.  High risk study due to severe decrease in LV systolic function and minimal ischemia.  No previous exam available for comparison.  Echocardiogram 01/08/2021: Study Quality: Technically difficult study. Moderately depressed LV systolic function with visual EF 30-35%. Left ventricle cavity is normal in size. Mild to moderate left ventricular hypertrophy. Hypokinetic global wall motion. Doppler evidence of grade I (impaired) diastolic dysfunction, normal LAP.  Compared to prior study dated 07/20/2019: no significant change.  Cardiac MRI 05/08/2022: LV is normal in size.  Basal to mid septum akinetic, distal septum severely hypokinetic. Basal to mid anterior severely hypokinetic distal anterior and apex are akinetic.   Lateral wall and inferior wall are hypokinetic. Global LV systolic function severely reduced, LVEF 35%. LA is mild to moderately enlarged. Anterior and anterolateral subendocardial  hyperenhancement in the LAD distribution consistent with prior myocardial infarction with partial reversibility, 50% viable.  ICD: Single chamber  MedtronicVisia AF MRI  12/15/2017   Remote single-chamber ICD transmission 12/23/2022: Longevity 4 years and 4 months.  VP <0.1%. Lead impedance and thresholds are normal.  There were no high rate episodes.  Thoracic impedance does not suggest volume overload state.  Normal single-chamber ICD transmission.   EKG:   *** EKG 10/10/2022: Normal sinus rhythm at rate of 75 bpm, normal axis.  Incomplete right bundle branch block.  Nonspecific T abnormality.  Compared to 07/04/2022, no significant change.  Assessment     ICD-10-CM   1. Coronary artery disease of native artery of native heart with stable angina pectoris  I25.118     2. Chronic systolic heart failure  I50.22     3. ICD:  Single chamber  MedtronicVisia AF MRI VR [id number] ICD -  12/15/2017  Z95.810     4. Hypercholesteremia  E78.00       No orders of the defined types were placed in this encounter.  There are no discontinued medications.   Recommendations:   Luis [name]  is a 77 y.o. male  with coronary artery disease with history of emergent CABG with LIMA to LAD and SVG to OM by Kathlee Nations Tright.  His past medical history includes hyperlipidemia, single chamber Medtronic ICD implantation on 12/15/2017 for primary prevention in view of persistent low LVEF.   ***  1. Coronary artery disease of native artery of native heart with stable angina pectoris (HCC) Patient's symptoms of left-sided chest pain could be anginal equivalent.  Advised him to try sublingual nitroglycerin and see whether he would improve with regard to his symptoms, as his symptoms appear to be on and off for several days, do not think ACS, no significant EKG change, as his blood pressure is very soft unable to uptitrate antianginal therapy.  Will start him on Ranexa 500 mg p.o. twice daily, after 1 week if he has not  had any improvement in overall wellbeing, he could increase this to 1000 mg twice daily.  Patient will contact me if he does so.  2. Chronic systolic heart failure [facility]) Patient presently is only on low-dose of  losartan, carvedilol and Jardiance at 10 mg daily as he has not been able to tolerate any increasing dose due to severe hypotension and dizziness.  Presently not in acute decompensated heart failure.  Continue present medications.  His recent ICD evaluation reveals that he indeed was heading towards heart failure but he has not crossed the line and thoracic impedance appears to be going back to the baseline.  I reviewed this data with the patient.  3. ICD:  Single chamber  MedtronicVisia AF MRI VR [id number] ICD -  12/15/2017 His ICD is functioning normally.  As discussed above, please see image, he was heading towards heart failure but has reverted back to baseline which correlates with his symptom onset about 4 to 5 days ago. He has an appointment to see me back sometime in April 2024, he will keep this appointment unless he has new symptoms or change in symptoms he is advised to contact me so I can see him back sooner.  4. Hypercholesteremia His LDL is very low, he is presently on minimal dose of Crestor as he was unable to tolerate statins but also on Repatha 140 mg every 2 weeks.  I will change this to once every month and see how he does with this.  We can check his LDL at 3 weeks closer to his next dose at 4 weeks and see whether there has been any significant change in his lipids.   Luis DecampJay Shanautica Forker, MD, [facility] Authority[facility] 01/02/2023, 3:53 PM Office: [phone] Fax: [phone] Pager: [phone]

## 2023-01-05 ENCOUNTER — Encounter: Payer: Self-pay | Admitting: Cardiology

## 2023-01-05 ENCOUNTER — Ambulatory Visit: Payer: Medicare Other | Admitting: Cardiology

## 2023-01-05 VITALS — BP 108/62 | HR 62 | Resp 16 | Ht 68.0 in | Wt 187.0 lb

## 2023-01-05 DIAGNOSIS — E559 Vitamin D deficiency, unspecified: Secondary | ICD-10-CM

## 2023-01-05 DIAGNOSIS — I25118 Atherosclerotic heart disease of native coronary artery with other forms of angina pectoris: Secondary | ICD-10-CM

## 2023-01-05 DIAGNOSIS — E7800 Pure hypercholesterolemia, unspecified: Secondary | ICD-10-CM

## 2023-01-05 DIAGNOSIS — Z95810 Presence of automatic (implantable) cardiac defibrillator: Secondary | ICD-10-CM

## 2023-01-05 DIAGNOSIS — I5022 Chronic systolic (congestive) heart failure: Secondary | ICD-10-CM

## 2023-01-07 LAB — URINALYSIS
Bilirubin, UA: NEGATIVE
Ketones, UA: NEGATIVE
Leukocytes,UA: NEGATIVE
Nitrite, UA: NEGATIVE
Specific Gravity, UA: 1.027 (ref 1.005–1.030)
Urobilinogen, Ur: 0.2 mg/dL (ref 0.2–1.0)
pH, UA: 5.5 (ref 5.0–7.5)

## 2023-01-07 LAB — PROTEIN / CREATININE RATIO, URINE
Creatinine, Urine: 120.3 mg/dL
Protein, Ur: 14.3 mg/dL
Protein/Creat Ratio: 119 mg/g creat (ref 0–200)

## 2023-01-07 LAB — VITAMIN D 25 HYDROXY (VIT D DEFICIENCY, FRACTURES): Vit D, 25-Hydroxy: 29.1 ng/mL — ABNORMAL LOW (ref 30.0–100.0)

## 2023-01-07 LAB — PRO B NATRIURETIC PEPTIDE: NT-Pro BNP: 200 pg/mL (ref 0–486)

## 2023-01-07 LAB — LIPOPROTEIN A (LPA): Lipoprotein (a): 30.3 nmol/L (ref <75.0)

## 2023-01-07 NOTE — Progress Notes (Signed)
Component Ref Range & Units 01/06/2023 Protein/Creat Ratio 0 - 200 mg/g creat 119  Lipoprotein (a) <75.0 nmol/L                          30.3

## 2023-01-29 ENCOUNTER — Other Ambulatory Visit: Payer: Self-pay | Admitting: Cardiology

## 2023-01-29 DIAGNOSIS — I25118 Atherosclerotic heart disease of native coronary artery with other forms of angina pectoris: Secondary | ICD-10-CM

## 2023-02-03 ENCOUNTER — Other Ambulatory Visit: Payer: Self-pay | Admitting: Cardiology

## 2023-02-06 ENCOUNTER — Other Ambulatory Visit: Payer: Self-pay

## 2023-02-06 MED ORDER — REPATHA SURECLICK 140 MG/ML ~~LOC~~ SOAJ: SUBCUTANEOUS | 1 refills | Status: DC

## 2023-02-23 ENCOUNTER — Encounter: Payer: Self-pay | Admitting: Cardiology

## 2023-03-09 ENCOUNTER — Encounter: Payer: Self-pay | Admitting: Cardiology

## 2023-03-09 ENCOUNTER — Ambulatory Visit: Payer: Medicare Other | Admitting: Cardiology

## 2023-03-09 VITALS — BP 121/81 | HR 74 | Ht 68.0 in | Wt 185.0 lb

## 2023-03-09 DIAGNOSIS — I25118 Atherosclerotic heart disease of native coronary artery with other forms of angina pectoris: Secondary | ICD-10-CM

## 2023-03-09 DIAGNOSIS — I5022 Chronic systolic (congestive) heart failure: Secondary | ICD-10-CM

## 2023-03-09 DIAGNOSIS — Z95810 Presence of automatic (implantable) cardiac defibrillator: Secondary | ICD-10-CM

## 2023-03-09 NOTE — Progress Notes (Signed)
Primary Physician/Referring:  Ralene Ok, MD  Patient ID: Luis [name], male    DOB: 1946/06/20, 77 y.o.   MRN: [id number]  Chief Complaint  Patient presents with   Coronary Artery Disease   Follow-up   Results   Chest Pain    HPI: Silvia Markuson  is a 77 y.o.  male  with coronary artery disease with history of emergent CABG with LIMA to LAD and SVG to OM by Kathlee Nations Tright.  His past medical history includes hyperlipidemia, hyperglycemia, hypovitaminosis D and single chamber Medtronic ICD implantation on 12/15/2017 for primary prevention in view of persistent low LVEF.   Patient called our office and made an urgent appointment to see me as he was having chest pain over the weekend, has used 2 sublingual nitroglycerin and 1 on Friday and the other 1 on Saturday which immediately for chest pain and has not had any recurrence.  He went for his routine walk this morning for about 2 miles without dyspnea or chest pain recurrence.  He feels well otherwise.  Past Medical History:  Diagnosis Date   Acute combined systolic and diastolic heart failure (HCC) 04/16/2017   Acute respiratory failure with hypoxia (HCC) 03/28/2017   AICD (automatic cardioverter/defibrillator) present 12/15/2017   Cardiogenic shock (HCC) 03/28/2017   Chronic combined systolic and diastolic heart failure (HCC) 05/20/2019   Chronic kidney disease    kidney stones;   Dyslipidemia    Encounter for assessment of implantable cardioverter-defibrillator (ICD) 05/20/2019   History of hepatomegaly    History of kidney stones    Sleep apnea    AHI 17 on sleep study 08/23/2013 pt states was given CPAP machine but sent it back - does not use now 12/15/2017   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   Vitamin D deficiency    as noted per H&P per Dr Ludwig Clarks 02/06/2015    Social History   Tobacco Use   Smoking status: Never   Smokeless tobacco: Never  Substance Use Topics   Alcohol use: No   Marital Status:  Married   Review of Systems  Cardiovascular:  Positive for chest pain. Negative for dyspnea on exertion and leg swelling.  Gastrointestinal:  Negative for melena.   Objective  Blood pressure 121/81, pulse 74, height 5\' 8"  (1.727 m), weight 185 lb (83.9 kg), SpO2 96 %. Body mass index is 28.13 kg/m.      03/09/2023   12:02 PM 01/05/2023    1:25 PM 10/10/2022    2:15 PM  Vitals with BMI  Height 5\' 8"  5\' 8"  5\' 8"   Weight 185 lbs 187 lbs 184 lbs 3 oz  BMI 28.14 28.44 28.01  Systolic 121 108 409  Diastolic 81 62 78  Pulse 74 62 76       Physical Exam Neck:     Vascular: No carotid bruit or JVD.  Cardiovascular:     Rate and Rhythm: Normal rate and regular rhythm.     Pulses: Intact distal pulses.     Heart sounds: Normal heart sounds. No murmur heard.    No gallop.  Pulmonary:     Effort: Pulmonary effort is normal.     Breath sounds: Normal breath sounds.  Abdominal:     General: Bowel sounds are normal.     Palpations: Abdomen is soft.  Musculoskeletal:     Right lower leg: No edema.     Left lower leg: No edema.    Laboratory examination:   Last vitamin D  Lab Results  Component Value Date   VD25OH 29.1 (L) 01/06/2023    ProBNP (last 3 results) Recent Labs    01/06/23 1032  PROBNP 200    External Labs:   Labs 02/09/2023:  Hb 14.6/HCT 44.8, platelets 234.  Normal indicis.  Serum glucose 106 mg, BUN 12, creatinine 1.27, EGFR 59 mL, potassium 5.1.  LFTs normal.  Total cholesterol 118, triglycerides 138, HDL 39, LDL 45.  A1c 6.4%.  Component Ref Range & Units 01/06/2023 Protein/Creat Ratio 0 - 200 mg/g creat               119  Lipoprotein (a) <75.0 nmol/L                          30.3  Labs 10/17/2022:  Serum glucose 93 mg, sodium 135 mg, potassium 5.3, BUN 12, creatinine 1.1, EGFR 69 mL  Labs 10/01/2022:  Total cholesterol 81, triglycerides 102, HDL 47, LDL 14.  Hb 13.9/HCT 42.7, platelets 267.  Serum glucose 93 mg, sodium 141, potassium 5.2, BUN  12, creatinine 1.1, EGFR 69 mL, LFTs normal.  Labs 01/09/2022:  A1c 6.1%.  Hb 15.1/HCT 45.0, platelets 258, normal indicis.  Total cholesterol 63, triglycerides 80, HDL 48, LDL below the reportable limits.  Non-HDL cholesterol 15.  Direct LDL 12.  Serum glucose 96 mg, BUN 13, creatinine 1.05, EGFR 74 mL, potassium 5.1, LFTs normal.   Radiology:   No results found.  Cardiac Studies:   Coronary angiogram 03/25/2017: Mild decrease in LV systolic function, EF 45% with inferior, inferoapical severe hypokinesis. LAD CTO midsegment just after origin of a large D1, short segment occlusion with type II collaterals from the RCA. Bridging Collaterals also evident. Mid circumflex very large with a 75% stenosis. Large OM1. Mild disease. RCA mild diffuse disease, large vessel. Type II collaterals to the LAD. Distal small PL secondary branch is occluded and has collaterals from left to right.    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 Patinet exercised for a total of 5 minutes and 59 seconds, achieving approximately 7.05 METs. Baseline heart rate was measured at 74 bpm. A maximum heart rate of 132 beats per minute was achieved, which is 89% of maximum predicted heart rate. Normal BP resonse.  Mild degree medium extent mildly abnormal perfusion consistent with mild (reversible) ischemia located in the mid anteroseptal wall and basal anterior wall (Left Anterior Descending Artery region) of left ventricle. Akinesis of the apical anterior wall, apical cap, apical septal wall, mid anteroseptal wall, mid anterior wall, basal anteroseptal wall and basal anterior wall of the left ventricle.  Stress LV EF: 26%.  High risk study due to severe decrease in LV systolic function and minimal ischemia.  No previous exam available for comparison.  Echocardiogram  01/08/2021: Study Quality: Technically difficult study. Moderately depressed LV systolic function with visual EF 30-35%. Left ventricle cavity is normal in size. Mild to moderate left ventricular hypertrophy. Hypokinetic global wall motion. Doppler evidence of grade I (impaired) diastolic dysfunction, normal LAP.  Compared to prior study dated 07/20/2019: no significant change.  Cardiac MRI 05/08/2022: LV is normal in size.  Basal to mid septum akinetic, distal septum severely hypokinetic. Basal to mid anterior severely hypokinetic distal anterior and apex are akinetic.   Lateral wall and inferior wall are hypokinetic.  Global LV systolic function severely reduced, LVEF 35%. LA is mild to moderately enlarged. Anterior and anterolateral subendocardial hyperenhancement in the LAD distribution consistent with prior myocardial infarction with partial reversibility, 50% viable.  ICD: Single chamber  MedtronicVisia AF MRI  12/15/2017   Remote single-chamber ICD transmission 02/10/2023: Longevity 4 years and 6 months.  VP <0.1%. Lead impedance and thresholds are normal.  There were no high rate episodes.  Thoracic impedance does not suggest volume overload state.  Normal single-chamber ICD transmission.    EKG:   EKG 03/09/2023: Normal sinus rhythm with rate of 72 bpm, left atrial enlargement, normal axis.  Incomplete right bundle branch block.  Nonspecific T abnormality.  Normal QT interval.  Compared to 01/05/2023, no significant change.  Medications   Allergies  Allergen Reactions   Contrast Media [Iodinated Contrast Media] Itching    Pt states he gets itchy when he has contrast.    Grass Pollen(K-O-R-T-Swt Vern)     Other reaction(s): Other (See Comments) Sneezing and watery eyes   Crestor [Rosuvastatin Calcium] Other (See Comments)    myalgias    Current Outpatient Medications:    aspirin EC 81 MG tablet, Take 81 mg by mouth daily., Disp: , Rfl:    carvedilol (COREG) 3.125 MG tablet,  Take 0.5 tablets (1.5625 mg total) by mouth 2 (two) times daily., Disp: 180 tablet, Rfl: 3   cholecalciferol (VITAMIN D) 1000 units tablet, Take 1,000 Units by mouth daily., Disp: , Rfl:    Evolocumab (REPATHA SURECLICK) 140 MG/ML SOAJ, INJECT 140 MG INTO THE SKIN EVERY 14 (FOURTEEN) DAYS., Disp: 2 mL, Rfl: 1   JARDIANCE 10 MG TABS tablet, Take 10 mg by mouth daily., Disp: , Rfl:    losartan (COZAAR) 25 MG tablet, TAKE 1 TABLET (25 MG TOTAL) BY MOUTH EVERY EVENING., Disp: 30 tablet, Rfl: 2   nitroGLYCERIN (NITROSTAT) 0.4 MG SL tablet, PLACE 1 TABLET (0.4 MG TOTAL) UNDER THE TONGUE EVERY 5 (FIVE) MINUTES AS NEEDED FOR CHEST PAIN., Disp: 25 tablet, Rfl: 4   NON FORMULARY, magnesium, Disp: , Rfl:    ranolazine (RANEXA) 500 MG 12 hr tablet, TAKE ONE TABLET BY MOUTH TWICE DAILY, Disp: 60 tablet, Rfl: 2   rosuvastatin (CRESTOR) 10 MG tablet, TAKE 1 TABLET (10 MG TOTAL) BY MOUTH DAILY., Disp: 100 tablet, Rfl: 3   vitamin B-12 (CYANOCOBALAMIN) 1000 MCG tablet, Take 1,000 mcg by mouth daily., Disp: , Rfl:    Assessment     ICD-10-CM   1. Coronary artery disease of native artery of native heart with stable angina pectoris (HCC)  I25.118 EKG 12-Lead    2. Chronic systolic heart failure (HCC)  Z61.09     3. ICD:  Single chamber  MedtronicVisia AF MRI VR [id number] ICD -  12/15/2017  Z95.810       No orders of the defined types were placed in this encounter.  Medications Discontinued During This Encounter  Medication Reason   Coenzyme Q10 (COQ10) 200 MG CAPS Patient Preference   Recommendations:   Thearon Gochanour  is a 77 y.o. male  with coronary artery disease with history of emergent CABG with LIMA to LAD and SVG to OM by Kathlee Nations Tright.  His past medical history includes hyperlipidemia, hyperglycemia, hypovitaminosis D and single chamber Medtronic ICD implantation on 12/15/2017 for primary prevention in view of persistent low LVEF.   He was last seen by me about 6 weeks ago, he made an urgent  appointment to see me due to chest pain.  1. Coronary artery disease of native artery of native heart with stable angina pectoris Patient remained stable with chest pain suggestive of chronic stable angina, over the weekend he has had 2 episodes of chest pain for which he took sublingual nitroglycerin with immediate relief.  Also this morning he went for his routine walk, for about 2 miles without any shortness of breath or chest pain.  Hence I reassured him and advised him that occasional episodes of angina is to be expected and as long as there is no frequent use of nitroglycerin or prolonged chest discomfort, we can continue to observe.  He is on best medical therapy for now and has remained stable.   2. Chronic systolic heart failure Although patient has chronic systolic heart failure, he has done remarkably well.  I also reviewed ICD data suggesting no fluid overload state.  Clinical examination reveals clear lung fields, no JVD, no leg edema.  He is on low-dose of losartan and also beta-blocker therapy due to soft blood pressure and also on Jardiance which he is tolerating.  Patient is concerned about increasing A1c levels, advised him that he is already on Jardiance that should cover his diabetes/prediabetes mellitus, overall stable, he has gained some weight which advised him to lose 5 pounds.  Suspect that his elevated A1c levels is due to recent weight gain again there is no suggestion of heart failure.  3. ICD:  Single chamber  MedtronicVisia AF MRI VR [id number] ICD -  12/15/2017 Normal function, no evidence of fluid overload state.  No ICD discharge.  No significant arrhythmias.  Otherwise stable from cardiac standpoint, I reviewed all the data and his recent labs with the patient and his daughter at the bedside, reassured him.  No changes in the medications necessary unless he has recurrence of angina pectoris.  I will see him back in 6 months.     Yates Decamp, MD, [facility] 03/09/2023, 12:54  PM Office: [phone] Fax: [phone] Pager: [phone]

## 2023-03-24 ENCOUNTER — Ambulatory Visit: Payer: Medicare Other | Admitting: Physician Assistant

## 2023-03-24 DIAGNOSIS — M545 Low back pain, unspecified: Secondary | ICD-10-CM | POA: Diagnosis not present

## 2023-03-24 NOTE — Progress Notes (Signed)
Office Visit Note   Patient: Luis [name]           Date of Birth: 16-Jan-1946           MRN: [id number] Visit Date: 03/24/2023              Requested by: Ralene Ok, MD [address] [place],  [place] [address] PCP: Ralene Ok, MD  Chief Complaint  Patient presents with   Lower Back - Pain      HPI: Patient is a pleasant 77 year old gentleman with a history of low back pain.  He normally sees Dr. August Saucer.  He has had epidural steroid injections in the past but is not sure how much it helped him.  He has a significant cardiac history so difficult for him to have alternative medications such as anti-inflammatories.  He has a 4-day history of low back pain radiating into the right posterior buttock down the back of the right leg.  Denies any fever chills denies any loss of bowel or bladder control denies any paresthesias.  Rates the pain is moderate.  He uses Tylenol and a heating pad  Assessment & Plan: Visit Diagnoses:  1. Low back pain, unspecified back pain laterality, unspecified chronicity, unspecified whether sciatica present     Plan: I had a discussion with the patient today.  He is neurologically intact and his strength is actually quite good.  I have recommended physical therapy as a good option for him to learn a long-term program so he continue have a be active as he supposed to be with his cardiac history.  He is willing to do this.  May follow-up with me as needed  Follow-Up Instructions: Return if symptoms worsen or fail to improve.   Ortho Exam  Patient is alert, oriented, no adenopathy, well-dressed, normal affect, normal respiratory effort. Patient has slight antalgic gait.  He has a negative straight leg raise.  Pain is in the posterior buttock rating down the back of the leg.  Compartments are soft and nontender he has no pain with manipulation of his hip.  Distally is neurovascularly intact.  He has 5 out of 5 with EHL dorsiflexors and plantar flexions of his  foot and ankle.  Extension and flexion of his legs.  No real tenderness to palpation over the lower spine no step-off no deformity  Imaging: No results found. No images are attached to the encounter.  Labs: Lab Results  Component Value Date   HGBA1C 6.1 (H) 03/26/2017     Lab Results  Component Value Date   ALBUMIN 2.9 (L) 04/07/2017   ALBUMIN 2.7 (L) 04/06/2017   ALBUMIN 2.6 (L) 04/05/2017    Lab Results  Component Value Date   MG 2.3 04/09/2017   MG 2.2 04/06/2017   MG 2.2 04/05/2017   Lab Results  Component Value Date   VD25OH 29.1 (L) 01/06/2023    No results found for: "PREALBUMIN"    Latest Ref Rng & Units 04/25/2019    6:06 PM 12/10/2017   10:53 AM 04/16/2017   12:30 AM  CBC EXTENDED  WBC 4.0 - 10.5 K/uL 12.0  6.5  7.7   RBC 4.22 - 5.81 MIL/uL 4.74  4.60  3.49   Hemoglobin 13.0 - 17.0 g/dL 37.6  28.3  9.9   HCT 15.1 - 52.0 % 43.9  40.5  30.8   Platelets 150 - 400 K/uL 180  243  414   NEUT# 1.7 - 7.7 K/uL 9.7  4.1  Lymph# 0.7 - 4.0 K/uL 0.9  1.5       There is no height or weight on file to calculate BMI.  Orders:  Orders Placed This Encounter  Procedures   Ambulatory referral to Physical Therapy   No orders of the defined types were placed in this encounter.    Procedures: No procedures performed  Clinical Data: No additional findings.  ROS:  All other systems negative, except as noted in the HPI. Review of Systems  Objective: Vital Signs: There were no vitals taken for this visit.  Specialty Comments:  MRI LUMBAR SPINE WITHOUT CONTRAST   TECHNIQUE:  Multiplanar, multisequence MR imaging of the lumbar spine was  performed. No intravenous contrast was administered.  COMPARISON:  CT scan 02/23/2015  FINDINGS:  Segmentation: 5 lumbar type vertebral bodies. The last full  intervertebral disc space is labeled L5-S1.  Alignment:  Normal.  Vertebrae: Normal marrow signal except for mild endplate reactive  changes and a few scattered  hemangiomas.  Conus medullaris: Extends to the bottom of L1 level and appears  normal.  Paraspinal and other soft tissues: Unremarkable.  Disc levels:  L1-2:  No significant findings.  L2-3: Minimal annular bulge with mild bilateral lateral recess  encroachment. No spinal or foraminal stenosis.  L3-4: Shallow disc protrusion with flattening of the ventral thecal  sac and mild bilateral lateral recess stenosis. No foraminal  stenosis. Mild facet disease.  L4-5: Focal central disc protrusion with mass effect on the ventral  thecal sac and mild bilateral lateral recess stenosis. No foraminal  stenosis.  L5-S1: Focal central disc protrusion with mass effect on the ventral  thecal sac and mild bilateral lateral recess and foraminal stenosis.  Moderate facet disease.  IMPRESSION:  Disc protrusions at L3-4, L4-5 and L5-S1 as described above.  Electronically Signed    By: Rudie Meyer M.D.    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   Coronary artery disease 03/25/2017   Past Medical History:  Diagnosis Date   Acute combined systolic and diastolic heart failure (HCC) 04/16/2017   Acute respiratory failure with hypoxia (HCC) 03/28/2017   AICD (automatic cardioverter/defibrillator) present 12/15/2017   Cardiogenic shock (HCC) 03/28/2017   Chronic combined systolic and diastolic heart failure (HCC) 05/20/2019   Chronic kidney disease    kidney stones;   Dyslipidemia    Encounter for assessment of implantable cardioverter-defibrillator (ICD) 05/20/2019   History of hepatomegaly     History of kidney stones    Sleep apnea    AHI 17 on sleep study 08/23/2013 pt states was given CPAP machine but sent it back - does not use now 12/15/2017   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   Vitamin D deficiency    as noted per H&P per Dr Ludwig Clarks 02/06/2015    Family History  Problem Relation Age of Onset   Healthy Mother    Healthy Father    Healthy Sister    Healthy Brother     Past Surgical History:  Procedure Laterality Date   ASD REPAIR N/A 03/25/2017   Procedure: ATRIAL SEPTAL DEFECT (  ASD) REPAIR;  Surgeon: Kerin Perna, MD;  Location: [facility] OR;  Service: Open Heart Surgery;  Laterality: N/A;   BIOPSY  03/10/2019   Procedure: BIOPSY;  Surgeon: Charna Elizabeth, MD;  Location: [facility]COPY;  Service: Endoscopy;;   CARDIAC DEFIBRILLATOR PLACEMENT  12/15/2017   COLONOSCOPY WITH PROPOFOL N/A 03/10/2019   Procedure: COLONOSCOPY WITH PROPOFOL;  Surgeon: Charna Elizabeth, MD;  Location: [facility];  Service: Endoscopy;  Laterality: N/A;   CORONARY ARTERY BYPASS GRAFT N/A 03/25/2017   Procedure: CORONARY ARTERY BYPASS GRAFTING (CABG) x 2 using left internal mammary artery and right greater saphenous leg vein using endosciope.;  Surgeon: Kerin Perna, MD;  Location: [facility] OR;  Service: Open Heart Surgery;  Laterality: N/A;   CYSTOSCOPY W/ RETROGRADES Left 03/26/2015   Procedure: CYSTOSCOPY WITH RETROGRADE PYELOGRAM;  Surgeon: Sebastian Ache, MD;  Location: [facility] ORS;  Service: Urology;  Laterality: Left;   ICD IMPLANT N/A 12/15/2017   Procedure: ICD IMPLANT;  Surgeon: Regan Lemming, MD;  Location: [facility] INVASIVE CV LAB;  Service: Cardiovascular;  Laterality: N/A;   LEFT HEART CATH AND CORONARY ANGIOGRAPHY N/A 03/25/2017   Procedure: Left Heart Cath and Coronary Angiography;  Surgeon: Yates Decamp, MD;  Location: [facility] INVASIVE CV LAB;  Service: Cardiovascular;  Laterality: N/A;   PLACEMENT OF IMPELLA LEFT VENTRICULAR ASSIST DEVICE  03/25/2017   Procedure: PLACEMENT OF IMPELLA LEFT  VENTRICULAR ASSIST DEVICE;  Surgeon: Kerin Perna, MD;  Location: [facility] OR;  Service: Open Heart Surgery;;   REMOVAL OF IMPELLA LEFT VENTRICULAR ASSIST DEVICE N/A 03/31/2017   Procedure: REMOVAL OF IMPELLA LEFT VENTRICULAR ASSIST DEVICE;  Surgeon: Kerin Perna, MD;  Location: [facility] OR;  Service: Open Heart Surgery;  Laterality: N/A;   ROBOT ASSISTED PYELOPLASTY Left 03/26/2015   Procedure: ROBOTIC ASSISTED PYELOPLASTY WITH STENT PLACEMENT, REMOVAL OF STONE AND CYST DECORTICATION;  Surgeon: Sebastian Ache, MD;  Location: [facility] ORS;  Service: Urology;  Laterality: Left;   TEE WITHOUT CARDIOVERSION N/A 03/25/2017   Procedure: TRANSESOPHAGEAL ECHOCARDIOGRAM (TEE);  Surgeon: Donata Clay, Theron Arista, MD;  Location: [facility] OR;  Service: Open Heart Surgery;  Laterality: N/A;   TEE WITHOUT CARDIOVERSION N/A 03/31/2017   Procedure: TRANSESOPHAGEAL ECHOCARDIOGRAM (TEE);  Surgeon: Donata Clay, Theron Arista, MD;  Location: [facility] OR;  Service: Open Heart Surgery;  Laterality: N/A;   Social History   Occupational History   Not on file  Tobacco Use   Smoking status: Never   Smokeless tobacco: Never  Vaping Use   Vaping Use: Never used  Substance and Sexual Activity   Alcohol use: No   Drug use: No   Sexual activity: Not on file

## 2023-03-27 ENCOUNTER — Telehealth: Payer: Self-pay | Admitting: Physical Therapy

## 2023-03-27 NOTE — Telephone Encounter (Signed)
Pt daughter called in stating her father would like to go ahead and start his PT he is complaining about pain please advise

## 2023-04-18 ENCOUNTER — Encounter: Payer: Self-pay | Admitting: Cardiology

## 2023-06-17 ENCOUNTER — Other Ambulatory Visit: Payer: Self-pay | Admitting: Cardiology

## 2023-06-17 DIAGNOSIS — I25118 Atherosclerotic heart disease of native coronary artery with other forms of angina pectoris: Secondary | ICD-10-CM

## 2023-07-03 ENCOUNTER — Other Ambulatory Visit: Payer: Self-pay | Admitting: Cardiology

## 2023-07-03 DIAGNOSIS — I25118 Atherosclerotic heart disease of native coronary artery with other forms of angina pectoris: Secondary | ICD-10-CM

## 2023-07-06 ENCOUNTER — Other Ambulatory Visit: Payer: Self-pay | Admitting: Cardiology

## 2023-08-17 ENCOUNTER — Ambulatory Visit (INDEPENDENT_AMBULATORY_CARE_PROVIDER_SITE_OTHER): Payer: Medicare Other

## 2023-08-17 DIAGNOSIS — I255 Ischemic cardiomyopathy: Secondary | ICD-10-CM | POA: Diagnosis not present

## 2023-08-17 DIAGNOSIS — I5022 Chronic systolic (congestive) heart failure: Secondary | ICD-10-CM

## 2023-08-18 LAB — CUP PACEART REMOTE DEVICE CHECK
Battery Remaining Longevity: 48 mo
Battery Voltage: 2.93 V
Brady Statistic RV Percent Paced: 0.01 %
Date Time Interrogation Session: 20241117012203
HighPow Impedance: 88 Ohm
Implantable Lead Connection Status: 753985
Implantable Lead Implant Date: 20190319
Implantable Lead Location: 753860
Implantable Pulse Generator Implant Date: 20190319
Lead Channel Impedance Value: 437 Ohm
Lead Channel Impedance Value: 456 Ohm
Lead Channel Pacing Threshold Amplitude: 0.625 V
Lead Channel Pacing Threshold Pulse Width: 0.4 ms
Lead Channel Sensing Intrinsic Amplitude: 5.25 mV
Lead Channel Sensing Intrinsic Amplitude: 5.25 mV
Lead Channel Setting Pacing Amplitude: 2.5 V
Lead Channel Setting Pacing Pulse Width: 0.4 ms
Lead Channel Setting Sensing Sensitivity: 0.3 mV
Zone Setting Status: 755011
Zone Setting Status: 755011

## 2023-09-03 ENCOUNTER — Ambulatory Visit: Payer: Medicare Other | Attending: Cardiology | Admitting: Cardiology

## 2023-09-03 ENCOUNTER — Encounter: Payer: Self-pay | Admitting: Cardiology

## 2023-09-03 VITALS — BP 124/72 | HR 72 | Resp 16 | Ht 68.0 in | Wt 184.6 lb

## 2023-09-03 DIAGNOSIS — E7800 Pure hypercholesterolemia, unspecified: Secondary | ICD-10-CM | POA: Diagnosis not present

## 2023-09-03 DIAGNOSIS — I5022 Chronic systolic (congestive) heart failure: Secondary | ICD-10-CM

## 2023-09-03 DIAGNOSIS — I25118 Atherosclerotic heart disease of native coronary artery with other forms of angina pectoris: Secondary | ICD-10-CM

## 2023-09-03 DIAGNOSIS — I255 Ischemic cardiomyopathy: Secondary | ICD-10-CM

## 2023-09-03 NOTE — Progress Notes (Signed)
Cardiology Office Note:  .   Date:  09/03/2023  ID:  Luis [name], DOB 17-Nov-1945, MRN [id number] PCP: Ralene Ok, MD   [facility]Care Providers Cardiologist:  Yates Decamp, MD   History of Present Illness: .   Luis [name] is a 77 y.o. male  with coronary artery disease with history of emergent CABG with LIMA to LAD and SVG to OM by Kathlee Nations Tright.  His past medical history includes hyperlipidemia, hyperglycemia, hypovitaminosis D and single chamber Medtronic ICD implantation on 12/15/2017 for primary prevention in view of persistent low LVEF.   Discussed the use of AI scribe software for clinical note transcription with the patient, who gave verbal consent to proceed.  History of Present Illness   The patient, with a history of coronary artery disease, heart failure, and low vitamin D, presents for a routine check before traveling to [place] for two months. They report stable blood pressure and no sleep problems. They are currently taking aspirin, carvedilol, losartan, Ranexa, Crestor, vitamin D, and Jardiance with no reported issues.      Review of Systems  Cardiovascular:  Negative for chest pain, dyspnea on exertion and leg swelling.    Labs   External Labs:  Labs 02/09/2023:   Hb 14.6/HCT 44.8, platelets 234.  Normal indicis.   Serum glucose 106 mg, BUN 12, creatinine 1.27, EGFR 59 mL, potassium 5.1.   LFTs normal.   Total cholesterol 118, triglycerides 138, HDL 39, LDL 45.   A1c 6.4%.  Physical Exam:   VS:  BP 124/72 (BP Location: Right Arm, Patient Position: Sitting, Cuff Size: Normal)   Pulse 72   Resp 16   Ht 5\' 8"  (1.727 m)   Wt 184 lb 9.6 oz (83.7 kg)   SpO2 97%   BMI 28.07 kg/m    Wt Readings from Last 3 Encounters:  09/03/23 184 lb 9.6 oz (83.7 kg)  03/09/23 185 lb (83.9 kg)  01/05/23 187 lb (84.8 kg)     Physical Exam Neck:     Vascular: No carotid bruit or JVD.  Cardiovascular:     Rate and Rhythm: Normal rate and regular rhythm.     Pulses:  Intact distal pulses.     Heart sounds: Normal heart sounds. No murmur heard.    No gallop.  Pulmonary:     Effort: Pulmonary effort is normal.     Breath sounds: Normal breath sounds.  Abdominal:     General: Bowel sounds are normal.     Palpations: Abdomen is soft.  Musculoskeletal:     Right lower leg: No edema.     Left lower leg: No edema.     Studies Reviewed: Marland Kitchen    Coronary angiogram 03/25/2017: Mild decrease in LV systolic function, EF 45% with inferior, inferoapical severe hypokinesis. LAD CTO midsegment just after origin of a large D1, short segment occlusion with type II collaterals from the RCA. Bridging Collaterals also evident. Mid circumflex very large with a 75% stenosis. Large OM1. Mild disease. RCA mild diffuse disease, large vessel. Type II collaterals to the LAD. Distal small PL secondary branch is occluded and has collaterals from left to right.    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  Basal to mid septum akinetic, distal septum severely hypokinetic. Basal to mid anterior severely hypokinetic distal anterior and apex are akinetic.  Lateral wall and inferior wall are hypokinetic. Global LV systolic function severely reduced, LVEF 35%. LA is mild to moderately enlarged. Anterior and anterolateral subendocardial hyperenhancement in the LAD distribution consistent with prior myocardial infarction with partial reversibility, 50% viable.  EKG:    EKG 03/09/2023: Normal sinus rhythm with rate of 72 bpm, left atrial enlargement, normal axis.  Incomplete right bundle branch block.  Nonspecific T abnormality.  Normal QT interval.  Compared to 01/05/2023, no significant change.   Medications and allergies    Allergies  Allergen Reactions   Contrast Media [Iodinated Contrast Media] Itching    Pt states he gets itchy when he has  contrast.    Grass Pollen(K-O-R-T-Swt Vern)     Other reaction(s): Other (See Comments) Sneezing and watery eyes   Crestor [Rosuvastatin Calcium] Other (See Comments)    myalgias     Current Outpatient Medications:    aspirin EC 81 MG tablet, Take 81 mg by mouth daily., Disp: , Rfl:    carvedilol (COREG) 3.125 MG tablet, Take 0.5 tablets (1.5625 mg total) by mouth 2 (two) times daily., Disp: 180 tablet, Rfl: 3   cholecalciferol (VITAMIN D) 1000 units tablet, Take 1,000 Units by mouth daily., Disp: , Rfl:    Evolocumab (REPATHA SURECLICK) 140 MG/ML SOAJ, INJECT 140 MG INTO THE SKIN EVERY 14 (FOURTEEN) DAYS., Disp: 2 mL, Rfl: 11   JARDIANCE 10 MG TABS tablet, Take 10 mg by mouth daily., Disp: , Rfl:    losartan (COZAAR) 25 MG tablet, TAKE 1 TABLET (25 MG TOTAL) BY MOUTH EVERY EVENING., Disp: 30 tablet, Rfl: 2   nitroGLYCERIN (NITROSTAT) 0.4 MG SL tablet, PLACE 1 TABLET (0.4 MG TOTAL) UNDER THE TONGUE EVERY 5 (FIVE) MINUTES AS NEEDED FOR CHEST PAIN., Disp: 25 tablet, Rfl: 4   NON FORMULARY, magnesium, Disp: , Rfl:    ranolazine (RANEXA) 500 MG 12 hr tablet, TAKE ONE TABLET BY MOUTH TWICE DAILY, Disp: 60 tablet, Rfl: 2   rosuvastatin (CRESTOR) 10 MG tablet, TAKE 1 TABLET (10 MG TOTAL) BY MOUTH DAILY., Disp: 100 tablet, Rfl: 3   vitamin B-12 (CYANOCOBALAMIN) 1000 MCG tablet, Take 1,000 mcg by mouth daily., Disp: , Rfl:    ASSESSMENT AND PLAN: .      ICD-10-CM   1. Coronary artery disease of native artery of native heart with stable angina pectoris (HCC)  I25.118     2. Chronic systolic heart failure (HCC)  M57.84 ECHOCARDIOGRAM COMPLETE    3. Hypercholesteremia  E78.00     4. Ischemic cardiomyopathy  I25.5 ECHOCARDIOGRAM COMPLETE     Assessment and Plan    Ischemic Cardiomyopathy Stable on current regimen. No reported symptoms of heart failure or angina. Blood pressure and heart rate well controlled. -Continue current medications:Aspirin, Carvedilol 3.125mg  BID, Losartan 25mg  daily,  Ranexa, Crestor 10mg  daily, Vitamin D, and Jardiance. -Order echocardiogram with contrast (Dfinity) after return from [place].  There is no acute decompensated heart failure.  From coronary disease standpoint he has not had any recurrence of angina pectoris as well.  I would have a low threshold to perform angiography, prior MRI had revealed anterior and anteroapical scar with moderate peri-infarct ischemia.  Will repeat echocardiogram to reevaluate his LV systolic function with Definity.  With regard to hypercholesterolemia, presently on Repatha and lipids are very well-controlled.  External labs reviewed.  Travel Health Patient planning to travel to [place] for two months. No specific health concerns related to travel mentioned. -Advised to be cautious with diet and stress during the  first few days of travel.  Follow-up in 6 months after return from [place].   Signed,  Yates Decamp, MD, [facility] 09/03/2023, 2:03 PM [facility] [address] [place], [place] [address] Phone: [phone]. Fax:  [phone]

## 2023-09-03 NOTE — Patient Instructions (Signed)
 Medication Instructions:  Your physician recommends that you continue on your current medications as directed. Please refer to the Current Medication list given to you today.  *If you need a refill on your cardiac medications before your next appointment, please call your pharmacy*   Lab Work: none If you have labs (blood work) drawn today and your tests are completely normal, you will receive your results only by: MyChart Message (if you have MyChart) OR A paper copy in the mail If you have any lab test that is abnormal or we need to change your treatment, we will call you to review the results.   Testing/Procedures: Your physician has requested that you have an echocardiogram. Echocardiography is a painless test that uses sound waves to create images of your heart. It provides your doctor with information about the size and shape of your heart and how well your heart's chambers and valves are working. This procedure takes approximately one hour. There are no restrictions for this procedure. Please do NOT wear cologne, perfume, aftershave, or lotions (deodorant is allowed). Please arrive 15 minutes prior to your appointment time.  Please note: We ask at that you not bring children with you during ultrasound (echo/ vascular) testing. Due to room size and safety concerns, children are not allowed in the ultrasound rooms during exams. Our front office staff cannot provide observation of children in our lobby area while testing is being conducted. An adult accompanying a patient to their appointment will only be allowed in the ultrasound room at the discretion of the ultrasound technician under special circumstances. We apologize for any inconvenience.    Follow-Up: At [facility], you and your health needs are our priority.  As part of our continuing mission to provide you with exceptional heart care, we have created designated Provider Care Teams.  These Care Teams include your  primary Cardiologist (physician) and Advanced Practice Providers (APPs -  Physician Assistants and Nurse Practitioners) who all work together to provide you with the care you need, when you need it.  We recommend signing up for the patient portal called "MyChart".  Sign up information is provided on this After Visit Summary.  MyChart is used to connect with patients for Virtual Visits (Telemedicine).  Patients are able to view lab/test results, encounter notes, upcoming appointments, etc.  Non-urgent messages can be sent to your provider as well.   To learn more about what you can do with MyChart, go to [email or web address].    Your next appointment:   6 month(s)  Provider:   Yates Decamp, MD     Other Instructions

## 2023-09-08 ENCOUNTER — Ambulatory Visit: Payer: Medicare Other | Admitting: Cardiology

## 2023-09-09 ENCOUNTER — Ambulatory Visit: Payer: Medicare Other | Admitting: Cardiology

## 2023-09-11 NOTE — Addendum Note (Signed)
Addended by: Geralyn Flash D on: 09/11/2023 02:50 PM   Modules accepted: Orders

## 2023-09-11 NOTE — Progress Notes (Signed)
Remote ICD transmission.   

## 2023-11-16 ENCOUNTER — Ambulatory Visit (INDEPENDENT_AMBULATORY_CARE_PROVIDER_SITE_OTHER): Payer: Medicare Other

## 2023-11-16 DIAGNOSIS — I255 Ischemic cardiomyopathy: Secondary | ICD-10-CM | POA: Diagnosis not present

## 2023-11-16 DIAGNOSIS — I5022 Chronic systolic (congestive) heart failure: Secondary | ICD-10-CM

## 2023-11-18 LAB — CUP PACEART REMOTE DEVICE CHECK
Battery Remaining Longevity: 44 mo
Battery Voltage: 2.94 V
Brady Statistic RV Percent Paced: 0.01 %
Date Time Interrogation Session: 20250219005204
HighPow Impedance: 84 Ohm
Implantable Lead Connection Status: 753985
Implantable Lead Implant Date: 20190319
Implantable Lead Location: 753860
Implantable Pulse Generator Implant Date: 20190319
Lead Channel Impedance Value: 437 Ohm
Lead Channel Impedance Value: 494 Ohm
Lead Channel Pacing Threshold Amplitude: 0.75 V
Lead Channel Pacing Threshold Pulse Width: 0.4 ms
Lead Channel Sensing Intrinsic Amplitude: 7 mV
Lead Channel Sensing Intrinsic Amplitude: 7 mV
Lead Channel Setting Pacing Amplitude: 2.5 V
Lead Channel Setting Pacing Pulse Width: 0.4 ms
Lead Channel Setting Sensing Sensitivity: 0.3 mV
Zone Setting Status: 755011
Zone Setting Status: 755011

## 2023-12-04 ENCOUNTER — Ambulatory Visit (HOSPITAL_COMMUNITY): Payer: Medicare Other | Attending: Cardiology

## 2023-12-04 DIAGNOSIS — I5022 Chronic systolic (congestive) heart failure: Secondary | ICD-10-CM | POA: Diagnosis present

## 2023-12-04 DIAGNOSIS — I255 Ischemic cardiomyopathy: Secondary | ICD-10-CM | POA: Insufficient documentation

## 2023-12-04 LAB — ECHOCARDIOGRAM COMPLETE
Area-P 1/2: 3.95 cm2
S' Lateral: 4.2 cm

## 2023-12-04 MED ORDER — PERFLUTREN LIPID MICROSPHERE
3.0000 mL | INTRAVENOUS | Status: AC | PRN
Start: 2023-12-04 — End: 2023-12-04
  Administered 2023-12-04: 3 mL via INTRAVENOUS

## 2023-12-14 ENCOUNTER — Encounter: Payer: Self-pay | Admitting: Cardiology

## 2023-12-14 NOTE — Progress Notes (Signed)
 Called the patient and left message personally.

## 2023-12-17 ENCOUNTER — Ambulatory Visit: Payer: Medicare Other | Admitting: Cardiology

## 2023-12-26 LAB — LAB REPORT - SCANNED
A1c: 6.3
EGFR: 67

## 2023-12-28 NOTE — Progress Notes (Signed)
 Remote ICD transmission.

## 2023-12-28 NOTE — Addendum Note (Signed)
 Addended by: Geralyn Flash D on: 12/28/2023 01:44 PM   Modules accepted: Orders

## 2023-12-29 NOTE — Progress Notes (Signed)
 PCP faxed labs 12/25/2023:  Total cholesterol 194, triglycerides 211, HDL 40, LDL 121.  Non-HDL cholesterol 154.  BUN 13, creatinine 1.14, EGFR 67 mL, potassium 5.0, LFTs normal.  A1c 6.3%.  Hb 15.7/HCT 48.4, platelets 270, normal indicis.

## 2024-01-01 ENCOUNTER — Other Ambulatory Visit: Payer: Self-pay | Admitting: Cardiology

## 2024-01-01 DIAGNOSIS — I5022 Chronic systolic (congestive) heart failure: Secondary | ICD-10-CM

## 2024-02-15 ENCOUNTER — Ambulatory Visit (INDEPENDENT_AMBULATORY_CARE_PROVIDER_SITE_OTHER): Payer: Medicare Other

## 2024-02-15 DIAGNOSIS — I255 Ischemic cardiomyopathy: Secondary | ICD-10-CM | POA: Diagnosis not present

## 2024-02-15 DIAGNOSIS — I5022 Chronic systolic (congestive) heart failure: Secondary | ICD-10-CM

## 2024-02-16 LAB — CUP PACEART REMOTE DEVICE CHECK
Battery Remaining Longevity: 44 mo
Battery Voltage: 2.93 V
Brady Statistic RV Percent Paced: 0.01 %
Date Time Interrogation Session: 20250519232726
HighPow Impedance: 85 Ohm
Implantable Lead Connection Status: 753985
Implantable Lead Implant Date: 20190319
Implantable Lead Location: 753860
Implantable Pulse Generator Implant Date: 20190319
Lead Channel Impedance Value: 456 Ohm
Lead Channel Impedance Value: 532 Ohm
Lead Channel Pacing Threshold Amplitude: 0.625 V
Lead Channel Pacing Threshold Pulse Width: 0.4 ms
Lead Channel Sensing Intrinsic Amplitude: 8.875 mV
Lead Channel Sensing Intrinsic Amplitude: 8.875 mV
Lead Channel Setting Pacing Amplitude: 2.5 V
Lead Channel Setting Pacing Pulse Width: 0.4 ms
Lead Channel Setting Sensing Sensitivity: 0.3 mV
Zone Setting Status: 755011
Zone Setting Status: 755011

## 2024-02-17 ENCOUNTER — Ambulatory Visit: Payer: Self-pay | Admitting: Cardiology

## 2024-04-08 NOTE — Progress Notes (Signed)
 Remote ICD transmission.

## 2024-04-08 NOTE — Addendum Note (Signed)
 Addended by: TAWNI DRILLING D on: 04/08/2024 12:00 PM   Modules accepted: Orders

## 2024-05-16 ENCOUNTER — Ambulatory Visit (INDEPENDENT_AMBULATORY_CARE_PROVIDER_SITE_OTHER): Payer: Medicare Other

## 2024-05-16 DIAGNOSIS — I255 Ischemic cardiomyopathy: Secondary | ICD-10-CM | POA: Diagnosis not present

## 2024-05-17 LAB — CUP PACEART REMOTE DEVICE CHECK
Battery Remaining Longevity: 41 mo
Battery Voltage: 2.91 V
Brady Statistic RV Percent Paced: 0.01 %
Date Time Interrogation Session: 20250818001803
HighPow Impedance: 87 Ohm
Implantable Lead Connection Status: 753985
Implantable Lead Implant Date: 20190319
Implantable Lead Location: 753860
Implantable Pulse Generator Implant Date: 20190319
Lead Channel Impedance Value: 437 Ohm
Lead Channel Impedance Value: 494 Ohm
Lead Channel Pacing Threshold Amplitude: 0.625 V
Lead Channel Pacing Threshold Pulse Width: 0.4 ms
Lead Channel Sensing Intrinsic Amplitude: 6.75 mV
Lead Channel Sensing Intrinsic Amplitude: 6.75 mV
Lead Channel Setting Pacing Amplitude: 2.5 V
Lead Channel Setting Pacing Pulse Width: 0.4 ms
Lead Channel Setting Sensing Sensitivity: 0.3 mV
Zone Setting Status: 755011
Zone Setting Status: 755011

## 2024-05-19 ENCOUNTER — Ambulatory Visit: Payer: Self-pay | Admitting: Cardiology

## 2024-05-26 ENCOUNTER — Ambulatory Visit (HOSPITAL_BASED_OUTPATIENT_CLINIC_OR_DEPARTMENT_OTHER): Admitting: Physician Assistant

## 2024-06-14 ENCOUNTER — Ambulatory Visit: Admitting: Physical Medicine and Rehabilitation

## 2024-06-14 ENCOUNTER — Encounter: Payer: Self-pay | Admitting: Physical Medicine and Rehabilitation

## 2024-06-14 DIAGNOSIS — S39012A Strain of muscle, fascia and tendon of lower back, initial encounter: Secondary | ICD-10-CM | POA: Diagnosis not present

## 2024-06-14 DIAGNOSIS — M7918 Myalgia, other site: Secondary | ICD-10-CM

## 2024-06-14 DIAGNOSIS — M5416 Radiculopathy, lumbar region: Secondary | ICD-10-CM

## 2024-06-14 NOTE — Progress Notes (Signed)
 Pain Scale   Average Pain 3 Patient advising he has lower back pain from aprox 1 week ago when he lifted something and has been hurting with pain radiating down the right leg.        +Driver, -BT, -Dye Allergies.

## 2024-06-14 NOTE — Progress Notes (Signed)
 Luis [name] - 79 y.o. male MRN [id number]  Date of birth: [birth year]  Office Visit Note: Visit Date: 06/14/2024 PCP: Valma Carwin, MD Referred by: Valma Carwin, MD  Subjective: Chief Complaint  Patient presents with   Lower Back - Pain   HPI: Luis [name] is a 78 y.o. male who comes in today for evaluation of acute on chronic right sided lower back pain radiating to buttock and down posterior thigh to knee. We have seen him in the past for lower back issues and performed multiple lumbar epidural steroid injections with good relief of pain. His pain worsened about 2 weeks ago, states he lifted a heavy bucket of milk and feels like he strained his back. He was seen at [facility] walk in clinic on 05/25/2024 and prescribed Meloxicam. He was seen by Grayce Remington, PA. Per her notes new lumbar radiographs negative for any acute findings. He is feeling much better today, rates his pain as 1 out of 10. He recently started formal physical therapy, reports these treatments are helping significantly to alleviate his pain. Lumbar MRI imaging from 2017 shows disc protrusions at L3-4, L4-5 and L5-S1. No high grade spinal canal stenosis noted. Patient denies focal weakness, numbness and tingling. No recent falls.      Review of Systems  Musculoskeletal:  Positive for back pain.  Neurological:  Negative for tingling, sensory change, focal weakness and weakness.  All other systems reviewed and are negative.  Otherwise per HPI.  Assessment & Plan: Visit Diagnoses: No diagnosis found.   Plan: Findings:  Acute on chronic right sided lower back pain radiating to buttock and down posterior leg to knee. His pain has improved significantly over the last several weeks with continued formal physical therapy and medications. I do feel this is more of an acute myofascial strain of the lumbar spine. We discussed treatment plan in detail today. Should his symptoms persist would consider obtaining new lumbar MRI  imaging. Would also consider repeating lumbar epidural steroid injections. I instructed him to continue with formal physical therapy as tolerated, also to continue with home exercise regimen post PT. He has no questions at this time. No red flag symptoms noted upon exam. I instructed him to let us  know if his symptoms return.     Meds & Orders: No orders of the defined types were placed in this encounter.  No orders of the defined types were placed in this encounter.   Follow-up: No follow-ups on file.   Procedures: No procedures performed      Clinical History: MRI LUMBAR SPINE WITHOUT CONTRAST   TECHNIQUE:  Multiplanar, multisequence MR imaging of the lumbar spine was  performed. No intravenous contrast was administered.  COMPARISON:  CT scan 02/23/2015  FINDINGS:  Segmentation: 5 lumbar type vertebral bodies. The last full  intervertebral disc space is labeled L5-S1.  Alignment:  Normal.  Vertebrae: Normal marrow signal except for mild endplate reactive  changes and a few scattered hemangiomas.  Conus medullaris: Extends to the bottom of L1 level and appears  normal.  Paraspinal and other soft tissues: Unremarkable.  Disc levels:  L1-2:  No significant findings.  L2-3: Minimal annular bulge with mild bilateral lateral recess  encroachment. No spinal or foraminal stenosis.  L3-4: Shallow disc protrusion with flattening of the ventral thecal  sac and mild bilateral lateral recess stenosis. No foraminal  stenosis. Mild facet disease.  L4-5: Focal central disc protrusion with mass effect on the ventral  thecal sac and mild bilateral  lateral recess stenosis. No foraminal  stenosis.  L5-S1: Focal central disc protrusion with mass effect on the ventral  thecal sac and mild bilateral lateral recess and foraminal stenosis.  Moderate facet disease.  IMPRESSION:  Disc protrusions at L3-4, L4-5 and L5-S1 as described above.  Electronically Signed    By: MYRTIS Stammer M.D.    On:  04/23/2016 15:32   He reports that he has never smoked. He has never used smokeless tobacco. No results for input(s): HGBA1C, LABURIC in the last 8760 hours.  Objective:  VS:  HT:    WT:   BMI:     BP:   HR: bpm  TEMP: ( )  RESP:  Physical Exam Vitals and nursing note reviewed.  HENT:     Head: Normocephalic and atraumatic.     Right Ear: External ear normal.     Left Ear: External ear normal.     Nose: Nose normal.     Mouth/Throat:     Mouth: Mucous membranes are moist.  Eyes:     Extraocular Movements: Extraocular movements intact.  Cardiovascular:     Rate and Rhythm: Normal rate.     Pulses: Normal pulses.  Pulmonary:     Effort: Pulmonary effort is normal.  Abdominal:     General: Abdomen is flat. There is no distension.  Musculoskeletal:        General: Tenderness present.     Cervical back: Normal range of motion.     Comments: Patient rises from seated position to standing without difficulty. Good lumbar range of motion. No pain noted with facet loading. 5/5 strength noted with bilateral hip flexion, knee flexion/extension, ankle dorsiflexion/plantarflexion and EHL. No clonus noted bilaterally. No pain upon palpation of greater trochanters. No pain with internal/external rotation of bilateral hips. Sensation intact bilaterally. Myofascial tenderness noted upon palpation of right paraspinal region. Negative slump test bilaterally. Ambulates without aid, gait steady.     Skin:    General: Skin is warm and dry.     Capillary Refill: Capillary refill takes less than 2 seconds.  Neurological:     General: No focal deficit present.     Mental Status: He is alert and oriented to person, place, and time.  Psychiatric:        Mood and Affect: Mood normal.        Behavior: Behavior normal.     Ortho Exam  Imaging: No results found.  Past Medical/Family/Surgical/Social History: Medications & Allergies reviewed per EMR, new medications updated. 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   Coronary artery disease 03/25/2017   Past Medical History:  Diagnosis Date   Acute combined systolic and diastolic heart failure (HCC) 04/16/2017   Acute respiratory failure with hypoxia (HCC) 03/28/2017   AICD (automatic cardioverter/defibrillator) present 12/15/2017   Cardiogenic shock (HCC) 03/28/2017   Chronic combined systolic and diastolic heart failure (HCC) 05/20/2019   Chronic kidney disease    kidney stones;   Dyslipidemia    Encounter for assessment of implantable cardioverter-defibrillator (ICD) 05/20/2019   History of hepatomegaly    History of kidney stones  Sleep apnea    AHI 17 on sleep study 08/23/2013 pt states was given CPAP machine but sent it back - does not use now 12/15/2017   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   Vitamin D  deficiency    as noted per H&P per Dr Valma 02/06/2015   Family History  Problem Relation Age of Onset   Healthy Mother    Healthy Father    Healthy Sister    Healthy Brother    Past Surgical History:  Procedure Laterality Date   ASD REPAIR N/A 03/25/2017   Procedure: ATRIAL SEPTAL DEFECT (ASD) REPAIR;  Surgeon: Fleeta Hanford Coy, MD;  Location: [facility] OR;  Service: Open Heart Surgery;  Laterality: N/A;   BIOPSY  03/10/2019   Procedure: BIOPSY;  Surgeon: Kristie Lamprey, MD;  Location: [facility] ENDOSCOPY;  Service: Endoscopy;;   CARDIAC DEFIBRILLATOR PLACEMENT  12/15/2017   COLONOSCOPY WITH PROPOFOL  N/A 03/10/2019   Procedure: COLONOSCOPY WITH PROPOFOL ;  Surgeon: Kristie Lamprey, MD;  Location: [facility] ENDOSCOPY;  Service: Endoscopy;  Laterality: N/A;    CORONARY ARTERY BYPASS GRAFT N/A 03/25/2017   Procedure: CORONARY ARTERY BYPASS GRAFTING (CABG) x 2 using left internal mammary artery and right greater saphenous leg vein using endosciope.;  Surgeon: Fleeta Hanford Coy, MD;  Location: [facility] OR;  Service: Open Heart Surgery;  Laterality: N/A;   CYSTOSCOPY W/ RETROGRADES Left 03/26/2015   Procedure: CYSTOSCOPY WITH RETROGRADE PYELOGRAM;  Surgeon: Ricardo Likens, MD;  Location: [facility] ORS;  Service: Urology;  Laterality: Left;   ICD IMPLANT N/A 12/15/2017   Procedure: ICD IMPLANT;  Surgeon: Inocencio Soyla Lunger, MD;  Location: [facility] INVASIVE CV LAB;  Service: Cardiovascular;  Laterality: N/A;   LEFT HEART CATH AND CORONARY ANGIOGRAPHY N/A 03/25/2017   Procedure: Left Heart Cath and Coronary Angiography;  Surgeon: Ladona Heinz, MD;  Location: [facility] INVASIVE CV LAB;  Service: Cardiovascular;  Laterality: N/A;   PLACEMENT OF IMPELLA LEFT VENTRICULAR ASSIST DEVICE  03/25/2017   Procedure: PLACEMENT OF IMPELLA LEFT VENTRICULAR ASSIST DEVICE;  Surgeon: Fleeta Hanford Coy, MD;  Location: [facility] OR;  Service: Open Heart Surgery;;   REMOVAL OF IMPELLA LEFT VENTRICULAR ASSIST DEVICE N/A 03/31/2017   Procedure: REMOVAL OF IMPELLA LEFT VENTRICULAR ASSIST DEVICE;  Surgeon: Fleeta Hanford Coy, MD;  Location: [facility] OR;  Service: Open Heart Surgery;  Laterality: N/A;   ROBOT ASSISTED PYELOPLASTY Left 03/26/2015   Procedure: ROBOTIC ASSISTED PYELOPLASTY WITH STENT PLACEMENT, REMOVAL OF STONE AND CYST DECORTICATION;  Surgeon: Ricardo Likens, MD;  Location: [facility] ORS;  Service: Urology;  Laterality: Left;   TEE WITHOUT CARDIOVERSION N/A 03/25/2017   Procedure: TRANSESOPHAGEAL ECHOCARDIOGRAM (TEE);  Surgeon: Fleeta Hanford, Coy, MD;  Location: [facility] OR;  Service: Open Heart Surgery;  Laterality: N/A;   TEE WITHOUT CARDIOVERSION N/A 03/31/2017   Procedure: TRANSESOPHAGEAL ECHOCARDIOGRAM (TEE);  Surgeon: Fleeta Hanford, Coy, MD;  Location: [facility] OR;  Service: Open Heart Surgery;  Laterality: N/A;   Social History    Occupational History   Not on file  Tobacco Use   Smoking status: Never   Smokeless tobacco: Never  Vaping Use   Vaping status: Never Used  Substance and Sexual Activity   Alcohol use: No   Drug use: No   Sexual activity: Not on file

## 2024-06-22 NOTE — Progress Notes (Signed)
Remote ICD Transmission.

## 2024-07-27 ENCOUNTER — Other Ambulatory Visit: Payer: Self-pay | Admitting: Cardiology

## 2024-07-27 DIAGNOSIS — I5022 Chronic systolic (congestive) heart failure: Secondary | ICD-10-CM

## 2024-08-01 ENCOUNTER — Encounter: Payer: Self-pay | Admitting: Radiology

## 2024-08-15 ENCOUNTER — Ambulatory Visit (INDEPENDENT_AMBULATORY_CARE_PROVIDER_SITE_OTHER): Payer: Medicare Other

## 2024-08-15 DIAGNOSIS — I5022 Chronic systolic (congestive) heart failure: Secondary | ICD-10-CM | POA: Diagnosis not present

## 2024-08-16 LAB — CUP PACEART REMOTE DEVICE CHECK
Battery Remaining Longevity: 40 mo
Battery Voltage: 2.91 V
Brady Statistic RV Percent Paced: 0 %
Date Time Interrogation Session: 20251117001703
HighPow Impedance: 87 Ohm
Implantable Lead Connection Status: 753985
Implantable Lead Implant Date: 20190319
Implantable Lead Location: 753860
Implantable Pulse Generator Implant Date: 20190319
Lead Channel Impedance Value: 456 Ohm
Lead Channel Impedance Value: 456 Ohm
Lead Channel Pacing Threshold Amplitude: 0.625 V
Lead Channel Pacing Threshold Pulse Width: 0.4 ms
Lead Channel Sensing Intrinsic Amplitude: 7.75 mV
Lead Channel Sensing Intrinsic Amplitude: 7.75 mV
Lead Channel Setting Pacing Amplitude: 2.5 V
Lead Channel Setting Pacing Pulse Width: 0.4 ms
Lead Channel Setting Sensing Sensitivity: 0.3 mV
Zone Setting Status: 755011
Zone Setting Status: 755011

## 2024-08-17 NOTE — Progress Notes (Signed)
 Remote ICD Transmission

## 2024-08-19 LAB — LAB REPORT - SCANNED
A1c: 6.3
Albumin, Urine POC: 1.8
Creatinine, POC: 120 mg/dL
EGFR: 72
Microalb Creat Ratio: 15

## 2024-08-24 ENCOUNTER — Ambulatory Visit: Payer: Self-pay | Admitting: Cardiology

## 2024-09-01 ENCOUNTER — Ambulatory Visit: Attending: Cardiology | Admitting: Cardiology

## 2024-09-01 ENCOUNTER — Encounter: Payer: Self-pay | Admitting: Cardiology

## 2024-09-01 VITALS — BP 100/58 | HR 82 | Ht 68.0 in | Wt 186.4 lb

## 2024-09-01 DIAGNOSIS — I5022 Chronic systolic (congestive) heart failure: Secondary | ICD-10-CM

## 2024-09-01 DIAGNOSIS — I2510 Atherosclerotic heart disease of native coronary artery without angina pectoris: Secondary | ICD-10-CM | POA: Diagnosis not present

## 2024-09-01 DIAGNOSIS — E7800 Pure hypercholesterolemia, unspecified: Secondary | ICD-10-CM | POA: Diagnosis not present

## 2024-09-01 DIAGNOSIS — Z95810 Presence of automatic (implantable) cardiac defibrillator: Secondary | ICD-10-CM | POA: Diagnosis not present

## 2024-09-01 DIAGNOSIS — Z79899 Other long term (current) drug therapy: Secondary | ICD-10-CM

## 2024-09-01 MED ORDER — EZETIMIBE 10 MG PO TABS: 10.0000 mg | ORAL_TABLET | Freq: Every day | ORAL | 3 refills | Status: DC

## 2024-09-01 NOTE — Patient Instructions (Signed)
 Medication Instructions:  Your physician recommends that you continue on your current medications as directed. Please refer to the Current Medication list given to you today.  *If you need a refill on your cardiac medications before your next appointment, please call your pharmacy*  Lab Work: Please complete your fasting lipid panel today at our first floor lab before you leave.  If you have labs (blood work) drawn today and your tests are completely normal, you will receive your results only by: MyChart Message (if you have MyChart) OR A paper copy in the mail If you have any lab test that is abnormal or we need to change your treatment, we will call you to review the results.  Testing/Procedures: None.  Follow-Up: At [facility], you and your health needs are our priority.  As part of our continuing mission to provide you with exceptional heart care, our providers are all part of one team.  This team includes your primary Cardiologist (physician) and Advanced Practice Providers or APPs (Physician Assistants and Nurse Practitioners) who all work together to provide you with the care you need, when you need it.  Your next appointment:   1 year(s)  Provider:   Gordy Bergamo, MD

## 2024-09-01 NOTE — Progress Notes (Signed)
 Cardiology Office Note:  .   Date:  09/02/2024  ID:  Luis [name], DOB [birth year], MRN [id number] PCP: Valma Carwin, MD  [facility] Providers Cardiologist:  Gordy Bergamo, MD   History of Present Illness: .   Luis [name] is a 78 y.o.  male  with coronary artery disease with history of emergent CABG with LIMA to LAD and SVG to OM by Maude Salinas Tright.  His past medical history includes hyperlipidemia, hyperglycemia, hypovitaminosis D and single chamber Medtronic ICD implantation on 12/15/2017 for primary prevention in view of persistent low LVEF.   Except for chronic dyspnea on exertion, he remains asymptomatic, no PND or orthopnea.  He texted me labs from his PCP after the visit.    Discussed the use of AI scribe software for clinical note transcription with the patient, who gave verbal consent to proceed.  History of Present Illness Luis [name] is a 78 year old male with cardiovascular disease who presents for a follow-up visit.  He reports a persistent morning cough and cold for about two to three weeks, with a mild sore throat, without other cardiopulmonary symptoms noted.  He feels new fatigue with stair climbing, which is a recent change in exertional tolerance. His daughter notes he tends to underreport symptoms.  In October he had a severe tight muscle in his leg that limited walking and kept him mostly on the floor for about fifteen days, which has reduced his regular exercise since then.  He is taking Coreg  3.125 mg twice daily, Jardiance 10 mg once daily, Losartan  25 mg once daily, Ranolazine  500 mg twice daily, Crestor  10 mg once daily, aspirin , and Repatha  for cholesterol management.  He is planning a two-month trip to [place] starting at the end of this month.  Cardiac Studies relevent.    ECHOCARDIOGRAM COMPLETE 12/04/2023  1. Very poor quality study. The LVF is reduced but cannot comment on EF or wall motion due to lack of endocardial border definition even with  definity . Left ventricular endocardial border not optimally defined to evaluate regional wall motion. Left ventricular diastolic parameters are indeterminate. 2. Right ventricular systolic function was not well visualized. The right ventricular size is normal.  Exercise tetrofosmin stress test 08/08/2019:  Mild degree medium extent mildly abnormal perfusion consistent with mild (reversible) ischemia located in the mid anteroseptal wall and basal anterior wall (Left Anterior Descending Artery region) of left ventricle. Akinesis of the apical anterior wall, apical cap, apical septal wall, mid anteroseptal wall, mid anterior wall, basal anteroseptal wall and basal anterior wall of the left ventricle. Stress LV EF: 26%.  Coronary angiogram 03/25/2017: Mild decrease in LV systolic function, EF 45% with inferior, inferoapical severe hypokinesis. LAD CTO midsegment just after origin of a large D1, short segment occlusion with type II collaterals from the RCA. Bridging Collaterals also evident. Mid circumflex very large with a 75% stenosis. Large OM1. Mild disease. RCA mild diffuse disease, large vessel. Type II collaterals to the LAD. Distal small PL secondary branch is occluded and has collaterals from left to right.    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  Total cholesterol 153, triglycerides 154, HDL 42, LDL 85.  Non-HDL cholesterol 111.  A1c 6.3%.  CBC normal with a hemoglobin of 14.9 and hematocrit 45.5.  Sodium 136, potassium 4.5, LFTs normal.  ROS  Review of Systems  Cardiovascular:  Positive for  dyspnea on exertion (chronic and stable). Negative for chest pain, leg swelling and orthopnea.   Physical Exam:   VS:  BP (!) 100/58 (BP Location: Left Arm, Patient Position: Sitting, Cuff Size: Normal)   Pulse 82   Ht 5' 8 (1.727 m)   Wt 186 lb 6.4 oz (84.6 kg)   SpO2 96%   BMI 28.34 kg/m    Wt Readings from Last 3 Encounters:  09/01/24 186 lb 6.4 oz (84.6 kg)  09/03/23 184 lb 9.6 oz (83.7 kg)  03/09/23 185 lb (83.9 kg)    BP Readings from Last 3 Encounters:  09/01/24 (!) 100/58  09/03/23 124/72  03/09/23 121/81   Physical Exam Neck:     Vascular: No carotid bruit or JVD.  Cardiovascular:     Rate and Rhythm: Normal rate and regular rhythm.     Pulses: Intact distal pulses.     Heart sounds: Normal heart sounds. No murmur heard.    No gallop.  Pulmonary:     Effort: Pulmonary effort is normal.     Breath sounds: Normal breath sounds.  Abdominal:     General: Bowel sounds are normal.     Palpations: Abdomen is soft.  Musculoskeletal:     Right lower leg: No edema.     Left lower leg: No edema.    EKG:    EKG Interpretation Date/Time:  Thursday September 01 2024 14:49:42 EST Ventricular Rate:  82 PR Interval:  188 QRS Duration:  82 QT Interval:  342 QTC Calculation: 399 R Axis:   -15  Text Interpretation: EKG 09/01/2024: Normal sinus rhythm at rate of 82 bpm, normal axis, incomplete right bundle branch block.  Nonspecific T abnormality.  Compared to 06/29/2018, T wave inversion in anterolateral leads not present. Confirmed by Sadie Pickar, Jagadeesh ([id number]) on 09/01/2024 3:04:48 PM    ASSESSMENT AND PLAN: .      ICD-10-CM   1. Chronic systolic heart failure (HCC)  P49.77     2. Coronary artery disease involving native coronary artery of native heart without angina pectoris  I25.10 EKG 12-Lead    Lipid panel    Lab report - scanned    DISCONTINUED: ezetimibe  (ZETIA ) 10 MG tablet    3. Hypercholesteremia  E78.00 DISCONTINUED: ezetimibe  (ZETIA ) 10 MG tablet    4.  ICD:  Single chamber  MedtronicVisia AF MRI VR [id number] ICD -  12/15/2017  Z95.810     5. Medication management  Z79.899      Remote ICD transmission 08/15/2024: V sensed rhythm, stable parameters.  No arrhythmias.  Thoracic impedance is at baseline and does not suggest volume overload state.  Assessment & Plan Chronic systolic heart failure Well-managed with no signs of heart failure or irregular heart rhythms over the last 12 months. ICD monitoring shows no issues. Echocardiogram windows are suboptimal, but ICD provides reliable monitoring for heart function decline. - Continue Coreg  3.125 mg twice daily - Continue Jardiance 10 mg once daily - Continue losartan  25 mg once daily - Encouraged daily physical activity for 20 minutes to maintain stamina  Coronary artery disease without angina Current medication regimen is effective. - Continue ranolazine  500  mg twice daily - Continue aspirin  therapy  Hypercholesterolemia Management is ongoing. Recent cholesterol level for LDL is high, necessitating re-evaluation. - Ordered Zetia  10 mg - Continue rosuvastatin  10 mg once daily - Continue Repatha  therapy - Check lipids in 2 months  Presence of automatic implantable cardiac defibrillator ICD is functioning optimally with no irregular heart rhythms or heart failure signs detected over the last year and a half. ICD provides reliable monitoring for heart function decline. - Continue monitoring ICD function  Addendum: Big discrepancy between the PCP labs and labs done here this morning where the LDL is normal. I will let patient know not to start Zetia .   Follow up: 1 year or sooner if problems.  Signed,  Gordy Bergamo, MD, [facility] 09/02/2024, 2:52 PM [facility] [address] [place], [place] [address] Phone: [phone]. Fax:  [phone]

## 2024-09-02 ENCOUNTER — Ambulatory Visit: Payer: Self-pay | Admitting: Cardiology

## 2024-09-02 LAB — LIPID PANEL
Chol/HDL Ratio: 3 ratio (ref 0.0–5.0)
Cholesterol, Total: 117 mg/dL (ref 100–199)
HDL: 39 mg/dL — ABNORMAL LOW (ref >39)
LDL Chol Calc (NIH): 52 mg/dL (ref 0–99)
Triglycerides: 155 mg/dL — ABNORMAL HIGH (ref 0–149)
VLDL Cholesterol Cal: 26 mg/dL (ref 5–40)

## 2024-09-02 NOTE — Addendum Note (Signed)
 Addended by: LADONA MILAN on: 09/02/2024 02:52 PM   Modules accepted: Orders

## 2025-02-13 ENCOUNTER — Encounter

## 2025-05-15 ENCOUNTER — Encounter

## 2025-08-14 ENCOUNTER — Encounter

## 2025-11-13 ENCOUNTER — Encounter

## 2026-02-12 ENCOUNTER — Encounter
